# Patient Record
Sex: Male | Born: 1980 | Race: White | Hispanic: No | Marital: Single | Smoking: Never smoker
Health system: Southern US, Community
[De-identification: ages and names within clinical notes are randomized; demographics above are authoritative.]

## PROBLEM LIST (undated history)

## (undated) DIAGNOSIS — J45909 Unspecified asthma, uncomplicated: Secondary | ICD-10-CM

## (undated) DIAGNOSIS — K2270 Barrett's esophagus without dysplasia: Secondary | ICD-10-CM

## (undated) DIAGNOSIS — K219 Gastro-esophageal reflux disease without esophagitis: Secondary | ICD-10-CM

## (undated) DIAGNOSIS — IMO0001 Reserved for inherently not codable concepts without codable children: Secondary | ICD-10-CM

## (undated) DIAGNOSIS — F419 Anxiety disorder, unspecified: Secondary | ICD-10-CM

## (undated) DIAGNOSIS — K2210 Ulcer of esophagus without bleeding: Secondary | ICD-10-CM

## (undated) DIAGNOSIS — M549 Dorsalgia, unspecified: Secondary | ICD-10-CM

## (undated) DIAGNOSIS — Z5189 Encounter for other specified aftercare: Secondary | ICD-10-CM

## (undated) DIAGNOSIS — K449 Diaphragmatic hernia without obstruction or gangrene: Secondary | ICD-10-CM

## (undated) DIAGNOSIS — K222 Esophageal obstruction: Secondary | ICD-10-CM

## (undated) DIAGNOSIS — D649 Anemia, unspecified: Secondary | ICD-10-CM

## (undated) DIAGNOSIS — F1910 Other psychoactive substance abuse, uncomplicated: Secondary | ICD-10-CM

## (undated) HISTORY — DX: Dorsalgia, unspecified: M54.9

## (undated) HISTORY — DX: Diaphragmatic hernia without obstruction or gangrene: K44.9

## (undated) HISTORY — DX: Ulcer of esophagus without bleeding: K22.10

## (undated) HISTORY — PX: UPPER GASTROINTESTINAL ENDOSCOPY: SHX188

## (undated) HISTORY — PX: ESOPHAGEAL DILATION: SHX303

## (undated) HISTORY — PX: COLONOSCOPY: SHX174

## (undated) HISTORY — DX: Encounter for other specified aftercare: Z51.89

## (undated) HISTORY — DX: Gastro-esophageal reflux disease without esophagitis: K21.9

## (undated) HISTORY — DX: Esophageal obstruction: K22.2

## (undated) HISTORY — DX: Unspecified asthma, uncomplicated: J45.909

## (undated) HISTORY — DX: Barrett's esophagus without dysplasia: K22.70

## (undated) HISTORY — DX: Other psychoactive substance abuse, uncomplicated: F19.10

## (undated) HISTORY — DX: Reserved for inherently not codable concepts without codable children: IMO0001

## (undated) HISTORY — PX: HAND SURGERY: SHX662

---

## 2010-01-13 ENCOUNTER — Emergency Department (HOSPITAL_COMMUNITY): Admission: EM | Admit: 2010-01-13 | Discharge: 2010-01-13 | Payer: Self-pay | Admitting: Emergency Medicine

## 2010-01-18 ENCOUNTER — Ambulatory Visit (HOSPITAL_BASED_OUTPATIENT_CLINIC_OR_DEPARTMENT_OTHER): Admission: RE | Admit: 2010-01-18 | Discharge: 2010-01-18 | Payer: Self-pay | Admitting: Orthopedic Surgery

## 2013-01-14 ENCOUNTER — Ambulatory Visit (INDEPENDENT_AMBULATORY_CARE_PROVIDER_SITE_OTHER): Payer: BC Managed Care – PPO | Admitting: Family Medicine

## 2013-01-14 VITALS — BP 118/71 | HR 90 | Temp 97.9°F | Resp 16 | Ht 72.0 in | Wt 184.0 lb

## 2013-01-14 DIAGNOSIS — IMO0002 Reserved for concepts with insufficient information to code with codable children: Secondary | ICD-10-CM

## 2013-01-14 DIAGNOSIS — M546 Pain in thoracic spine: Secondary | ICD-10-CM

## 2013-01-14 DIAGNOSIS — S3992XA Unspecified injury of lower back, initial encounter: Secondary | ICD-10-CM

## 2013-01-14 NOTE — Patient Instructions (Addendum)
Take the diclofenac one twice daily  Take acetaminophen (Tylenol) 500 mg tablets or capsules 2 pills 3 or 4 times daily, maximum 4000 mg in 24 hours.   We will refer you to a back specialist for assessment of your back. I'm not sure why it has continued to be so much trouble 5 years after the injury.  Follow up with your new primary care doctor.

## 2013-01-14 NOTE — Progress Notes (Signed)
Subjective: Patient came in complaining of having fallen off a course this weekend landing on his back on gravel. His head and legs in did not strike hard. He doesn't think he had loss of consciousness. He went to his PCP who put him on diclofenac twice a day. He feels like he needs something more for pain until he can get into the physical therapy appointment that they set up for him for next week.   Objective: He is alert but somewhat evasive in his answers. He did not seem consistent, and did not seem clear on exactly when he had fallen. At first he said yesterday and then he said day before yesterday. He is concerned about needing pain medicine because of his having to get through the week and his job. He is very pale looking, sluggish in his responses. Denies any drug use. Neck supple. Chest clear. Heart regular without murmurs. Tender mid thoracic spine between the scapula. Scapula are nontender. His back does not have any redness like somebody who is following hard on it. Upper extremities have full range of motion. Straight leg raise test negative  Assessment: Back pain secondary to falling off a horse  Plan: I had ordered x-rays of his back and chest and a CBC because he was a little pale. He declines the studies when somebody came in the room to get his x-rays. However when I went back in the room he changes the whole story. Apparently he fell off horse about 5 years ago. He said his back was for couple of years and then for the last several years has been hurting badly. It is been worse recently. We did do a database check to see if he was abusing medications that we could find, and the database was negative except for the alprazolam which he takes.  I explained to them that I cannot give him any pain medications. We will make referral to a back specialist for him. He is considering finding a mother primary care who has compensated, I told him that would probably be a wise idea. It was a very  unusual visit.  Assessment: Back pain secondary to falling off a horse Confabulated history.

## 2013-01-22 ENCOUNTER — Ambulatory Visit (INDEPENDENT_AMBULATORY_CARE_PROVIDER_SITE_OTHER): Payer: BC Managed Care – PPO | Admitting: Family Medicine

## 2013-01-22 ENCOUNTER — Encounter: Payer: Self-pay | Admitting: Family Medicine

## 2013-01-22 VITALS — BP 120/70 | HR 72 | Temp 97.5°F | Resp 12 | Ht 72.0 in | Wt 181.0 lb

## 2013-01-22 DIAGNOSIS — G4700 Insomnia, unspecified: Secondary | ICD-10-CM

## 2013-01-22 DIAGNOSIS — M546 Pain in thoracic spine: Secondary | ICD-10-CM

## 2013-01-22 DIAGNOSIS — F5104 Psychophysiologic insomnia: Secondary | ICD-10-CM

## 2013-01-22 NOTE — Patient Instructions (Addendum)
We will call you regarding x-ray results If x-rays OK will plan to set up some physical therapy.

## 2013-01-22 NOTE — Progress Notes (Signed)
  Subjective:    Patient ID: Eric Hood, male    DOB: February 05, 1981, 32 y.o.   MRN: [id number]  HPI New patient evaluation Previously seen at [facility] family practice Denies any chronic medical problems. No prior surgeries other than hand surgery related to acute tendon injury years ago. Takes no regular medications.  Patient has issue of some right upper thoracic back pain he states since falling off a horse in 2008. He denies ever having any x-rays. Initially went to [facility] family practice and placed on diclofenac which has not helped. Pain is constant. Moderate severity. Location is medial to right scapula. Denies dyspnea. No upper extremity weakness or numbness. No cervical neck pain. Has never had any physical therapy.  When questioned regarding whether he has seen other providers he initially only mentioned [facility]. We have electronic records of having been seen at [facility] on 01/14/2013  He relates some chronic insomnia which he attributes to the back pain. Apparently was placed on alprazolam several months ago at prior practice  Past Medical History  Diagnosis Date  . Back pain    Past Surgical History  Procedure Laterality Date  . Hand surgery Left     reports that he has never smoked. He does not have any smokeless tobacco history on file. He reports that he does not use illicit drugs. His alcohol history is not on file. family history includes Hypertension in his father and mother. No Known Allergies    Review of Systems  Constitutional: Negative for fever, chills, appetite change and unexpected weight change.  HENT: Negative for neck pain.   Respiratory: Negative for cough, shortness of breath and wheezing.   Cardiovascular: Negative for chest pain, palpitations and leg swelling.  Gastrointestinal: Negative for abdominal pain.  Musculoskeletal: Positive for back pain.  Skin: Negative for rash.  Hematological: Negative  for adenopathy. Does not bruise/bleed easily.       Objective:   Physical Exam  Constitutional: He appears well-developed and well-nourished.  Neck: Neck supple. No thyromegaly present.  Cardiovascular: Normal rate and regular rhythm.   Pulmonary/Chest: Effort normal and breath sounds normal. No respiratory distress. He has no wheezes. He has no rales.  Musculoskeletal:  Patient has no new tenderness to palpation thoracic spine. He does have some soft tissue tenderness just lateral to the mid aspect of thoracic spine medial to right scapular region.  Lymphadenopathy:    He has no cervical adenopathy.          Assessment & Plan:  Chronic predominantly right sided upper thoracic back pain of 6 years duration. Patient never had any x-rays nor any physical therapy. Set up thoracic spine films. If normal, set up physical therapy. Continue anti-inflammatory with diclofenac in the meantime Chronic insomnia. We've recommended against any chronic use of alprazolam for insomnia. Send request for records

## 2013-01-28 ENCOUNTER — Ambulatory Visit (INDEPENDENT_AMBULATORY_CARE_PROVIDER_SITE_OTHER)
Admission: RE | Admit: 2013-01-28 | Discharge: 2013-01-28 | Disposition: A | Discharge status: Home / Self Care | Payer: BC Managed Care – PPO | Source: Ambulatory Visit | Attending: Family Medicine | Admitting: Family Medicine

## 2013-01-28 DIAGNOSIS — M546 Pain in thoracic spine: Secondary | ICD-10-CM

## 2013-01-28 NOTE — Progress Notes (Signed)
Quick Note:  Pt informed, reports he has a follow-up this week at office, will discuss at that time ______

## 2013-02-03 ENCOUNTER — Ambulatory Visit: Payer: BC Managed Care – PPO | Admitting: Family Medicine

## 2013-02-03 ENCOUNTER — Telehealth: Payer: Self-pay | Admitting: *Deleted

## 2013-02-03 NOTE — Telephone Encounter (Signed)
Pt was a no show for appt, follow-up on meds. Message left on cell VM re: missed appt.

## 2013-02-03 NOTE — Telephone Encounter (Signed)
I do not see any past no show appts.

## 2013-02-11 ENCOUNTER — Ambulatory Visit (INDEPENDENT_AMBULATORY_CARE_PROVIDER_SITE_OTHER): Payer: Self-pay | Admitting: Family Medicine

## 2013-02-11 ENCOUNTER — Encounter: Payer: Self-pay | Admitting: Family Medicine

## 2013-02-11 VITALS — BP 120/68 | Temp 99.1°F | Wt 177.0 lb

## 2013-02-11 DIAGNOSIS — D239 Other benign neoplasm of skin, unspecified: Secondary | ICD-10-CM

## 2013-02-11 DIAGNOSIS — M546 Pain in thoracic spine: Secondary | ICD-10-CM | POA: Insufficient documentation

## 2013-02-11 DIAGNOSIS — D229 Melanocytic nevi, unspecified: Secondary | ICD-10-CM

## 2013-02-11 DIAGNOSIS — R05 Cough: Secondary | ICD-10-CM

## 2013-02-11 MED ORDER — PREDNISONE 20 MG PO TABS: ORAL_TABLET | ORAL | Status: DC

## 2013-02-11 MED ORDER — AZITHROMYCIN 250 MG PO TABS: ORAL_TABLET | ORAL | Status: AC

## 2013-02-11 MED ORDER — GABAPENTIN 300 MG PO CAPS: 300.0000 mg | ORAL_CAPSULE | Freq: Two times a day (BID) | ORAL | Status: DC

## 2013-02-11 NOTE — Progress Notes (Signed)
  Subjective:    Patient ID: Eric Hood, male    DOB: 12/08/1980, 32 y.o.   MRN: [id number]  HPI Patient seen for followup several items as below  He had several year history of thoracic back pain as previously outlined. Started after falling off a horse-6 years ago. We obtained thoracic spine films which showed no acute abnormality. He is describing a burning type pain and occasional numbness mostly to the right thoracic area but occasionally left thoracic region as well. No history of shingles. Has tried anti-inflammatories without much improvement. Pain frequently wakes him at night.  He has acute issue of cough which is productive past several days. Some mild wheezing off and on. No history of asthma. He had some low-grade fevers past couple days. Increase fatigue.  Patient has multiple nevi. Denies any bleeding or itching with moles.  Past Medical History  Diagnosis Date  . Back pain    Past Surgical History  Procedure Laterality Date  . Hand surgery Left     reports that he has never smoked. He does not have any smokeless tobacco history on file. He reports that he does not use illicit drugs. His alcohol history is not on file. family history includes Hypertension in his father and mother. No Known Allergies    Review of Systems  Constitutional: Positive for fever, chills and fatigue. Negative for appetite change and unexpected weight change.  HENT: Positive for sore throat.   Respiratory: Positive for cough and wheezing. Negative for shortness of breath.   Musculoskeletal: Positive for back pain.       Objective:   Physical Exam  Constitutional: He appears well-developed and well-nourished.  HENT:  Right Ear: External ear normal.  Left Ear: External ear normal.  Mouth/Throat: Oropharynx is clear and moist.  Neck: Neck supple.  Cardiovascular: Normal rate and regular rhythm.   Pulmonary/Chest:  Patient has some diffuse wheezes. No rales. Symmetric breath  sounds. No retractions. O2 sat 98%  Musculoskeletal:  Patient has some mild tenderness parathoracic spine and right and left side around T4-T5 region.  Lymphadenopathy:    He has no cervical adenopathy.  Skin:  Patient has fairly large nevus left lower lumbar region. Approximately 1 cm diameter. Somewhat indistinct border. Mostly symmetric. Mild color variegation          Assessment & Plan:  #1 chronic thoracic back pain. Question neuropathic. Recent x-rays unremarkable. Trial of gabapentin 300 mg at night and after one week titrate 300 mg twice daily if necessary and reassess in 2 weeks #2 atypical nevus left lower back. We recommended followup for excision in 2 weeks #3 acute respiratory illness. He has associated wheezing. Start Zithromax. Prednisone 20 mg 2 daily for 6 days

## 2013-02-11 NOTE — Patient Instructions (Addendum)
Schedule for mole excision. Start Gabapentin one at night for one week and then titrate to one twice daily if needed.

## 2013-02-13 ENCOUNTER — Telehealth: Payer: Self-pay | Admitting: Family Medicine

## 2013-02-13 MED ORDER — HYDROCODONE-HOMATROPINE 5-1.5 MG/5ML PO SYRP: 5.0000 mL | ORAL_SOLUTION | Freq: Four times a day (QID) | ORAL | Status: DC | PRN

## 2013-02-13 NOTE — Telephone Encounter (Signed)
Patient Information:  Caller Name: Furkan  Phone: [phone]  Patient: Eric Hood  Gender: Male  DOB: Aug 07, 1981  Age: 32 Years  PCP: Evelena Peat [facility])  Office Follow Up:  Does the office need to follow up with this patient?: Yes  Instructions For The Office: Patient reuesting prescription cough syrup.  Please call him back.  RN Note:  Patient reuesting prescription cough syrup.  Please call him back.  He is currently taking OTC cough syrup with DM but advises that the coughing is interfering with work and sleep.  Symptoms  Reason For Call & Symptoms: Onset 02/08/2013 productive cough with greenish sputum.  Reviewed Health History In EMR: Yes  Reviewed Medications In EMR: Yes  Reviewed Allergies In EMR: Yes  Reviewed Surgeries / Procedures: Yes  Date of Onset of Symptoms: 02/08/2013  Treatments Tried: Robitussin, Nyquil  Treatments Tried Worked: No  Guideline(s) Used:  Cough  Disposition Per Guideline:   Home Care  Reason For Disposition Reached:   Cough with no complications  Advice Given:  Reassurance  Coughing is the way that our lungs remove irritants and mucus. It helps protect our lungs from getting pneumonia.  You can also get a cough after being exposed to irritating substances like smoke, strong perfumes, and dust.  Here is some care advice that should help.  Cough Medicines:  OTC Cough Syrups: The most common cough suppressant in OTC cough medications is dextromethorphan. Often the letters "DM" appear in the name.  OTC Cough Drops: Cough drops can help a lot, especially for mild coughs. They reduce coughing by soothing your irritated throat and removing that tickle sensation in the back of the throat. Cough drops also have the advantage of portability - you can carry them with you.  OTC Cough Syrup - Dextromethorphan:  Cough syrups containing the cough suppressant dextromethorphan (DM) may help decrease your cough. Cough syrups work best for  coughs that keep you awake at night. They can also sometimes help in the late stages of a respiratory infection when the cough is dry and hacking. They can be used along with cough drops.  Examples: Benylin, Robitussin DM, Vicks 44 Cough Relief  Read the package instructions for dosage, contraindications, and other important information.  Caution - Dextromethorphan:   Do not try to completely suppress coughs that produce mucus and phlegm. Remember that coughing is helpful in bringing up mucus from the lungs and preventing pneumonia.  Research Notes: Dextromethorphan in some research studies has been shown to reduce the frequency and severity of cough in adults (18 years or older) without significant adverse effects. However, other studies suggest that dextromethorphan is no better than placebo at reducing a cough.  Drug Abuse Potential: It should be noted that dextromethorphan has become a drug of abuse. This problem is seen most often in adolescents. Overdose symptoms can range from giggling and euphoria to hallucinations and coma.  Coughing Spasms:  Drink warm fluids. Inhale warm mist (Reason: both relax the airway and loosen up the phlegm).  Suck on cough drops or hard candy to coat the irritated throat.  Prevent Dehydration:  Drink adequate liquids.  This will help soothe an irritated or dry throat and loosen up the phlegm.  Avoid Tobacco Smoke:  Smoking or being exposed to smoke makes coughs much worse.  Expected Course:   The expected course depends on what is causing the cough.  Viral bronchitis (chest cold) causes a cough that lasts 1 to 3 weeks. Sometimes you  may cough up lots of phlegm (sputum, mucus). The mucus can normally be white, gray, yellow, or green.  Call Back If:  Difficulty breathing  Cough lasts more than 3 weeks  Fever lasts > 3 days  You become worse.  Patient Will Follow Care Advice:  YES

## 2013-02-13 NOTE — Telephone Encounter (Signed)
Pt had OV on 4/29 (Tues).  He is taking zithromax and Prednoisone as directed, however he is still coughing bad, requesting a cough med.

## 2013-02-13 NOTE — Telephone Encounter (Signed)
Hycodan 1 tsp po q 6 hours prn cough, disp 120 ml with no refill.

## 2013-02-13 NOTE — Telephone Encounter (Signed)
Rx called in, pt informed

## 2013-02-18 ENCOUNTER — Telehealth: Payer: Self-pay | Admitting: Family Medicine

## 2013-02-18 NOTE — Telephone Encounter (Signed)
Pt informed and he voiced his understanding.

## 2013-02-18 NOTE — Telephone Encounter (Signed)
Caller: Eric Hood/Patient; Phone: [phone]; Reason for Call: Reports he was seen in the office last week on 02/11/13 and given Zithromax, Prednisone, Hydromet cough syrup.  Patient reports his symptoms are not improving.  He feels more fatigued than he did when he was seen in the office.  Reports productive cough with green mucus.  Afebrile.  Patient would like to know if there is something different he should be taking at this time.  Please contact him with further instructions.

## 2013-02-18 NOTE — Telephone Encounter (Signed)
No.  He should not be having any fever at this time.  If not feeling better by end of week I would go ahead with CXR.

## 2013-02-25 ENCOUNTER — Encounter: Payer: Self-pay | Admitting: Family Medicine

## 2013-02-25 ENCOUNTER — Ambulatory Visit (INDEPENDENT_AMBULATORY_CARE_PROVIDER_SITE_OTHER): Payer: BC Managed Care – PPO | Admitting: Family Medicine

## 2013-02-25 VITALS — BP 102/62 | Temp 99.7°F | Wt 177.0 lb

## 2013-02-25 DIAGNOSIS — D229 Melanocytic nevi, unspecified: Secondary | ICD-10-CM

## 2013-02-25 DIAGNOSIS — D239 Other benign neoplasm of skin, unspecified: Secondary | ICD-10-CM

## 2013-02-25 DIAGNOSIS — M546 Pain in thoracic spine: Secondary | ICD-10-CM

## 2013-02-25 NOTE — Patient Instructions (Signed)
Keep wound dry for the first 24 hours then clean daily with soap and water for one week. Apply topical antibiotic daily for 3-4 days. Keep covered with clean dressing for 4-5 days. Follow up promptly for any signs of infection such as redness, warmth, pain, or drainage.  Go ahead and titrate gabapentin up to three times daily

## 2013-02-25 NOTE — Progress Notes (Signed)
  Subjective:    Patient ID: Eric Hood, male    DOB: 1981/09/19, 32 y.o.   MRN: [id number]  HPI Patient seen for followup regarding the following items  Chronic thoracic back pain. Refer to prior note. Six-year duration following injury from horseback riding Recent x-rays revealed no acute bony abnormality Continues to describe burning type sensation right thoracic region. Somewhat intermittent. 7-8/10 intensity at worse We suspected possible neuropathic origin. Started gabapentin 300 mg at night last visit. Past couple days taking 300 mg twice daily.  Has not seen much improvement in pain thus far. We incidentally noted atypical nevus left lower back last visit. No itching or bleeding. No history of skin cancer. We planned excision today  Past Medical History  Diagnosis Date  . Back pain    Past Surgical History  Procedure Laterality Date  . Hand surgery Left     reports that he has never smoked. He does not have any smokeless tobacco history on file. He reports that he does not use illicit drugs. His alcohol history is not on file. family history includes Hypertension in his father and mother. No Known Allergies    Review of Systems  Constitutional: Negative for fever and chills.  Respiratory: Negative for cough and shortness of breath.   Cardiovascular: Negative for chest pain.  Gastrointestinal: Negative for abdominal pain.  Musculoskeletal: Positive for back pain.  Skin: Negative for rash.       Left lower lumbar region reveals about 1.2 cm nevus which has mild asymmetry and slightly irregular border. He has mild color variegation.       Objective:   Physical Exam  Constitutional: He appears well-developed and well-nourished. No distress.  Cardiovascular: Normal rate and regular rhythm.   Pulmonary/Chest: Effort normal and breath sounds normal. No respiratory distress. He has no wheezes. He has no rales.  Musculoskeletal: He exhibits no edema.  Skin:   Patient has approximately 1.2 cm nevus left lower lumbar region. Mild color variegation. Minimal asymmetry.          Assessment & Plan:  #1 chronic thoracic back pain. Titrate gabapentin 300 mg 3 times a day. Reassess at followup in 10 days  #2 atypical nevus left lumbar region. Discussed risk and benefits of elliptical excision and patient consented. Anesthetized region with Xylocaine with epinephrine. Prepped with Betadine. Using sterile technique, elliptical excision with #15 blade. Minimal bleeding. Wound closed with 5-0 Ethilon. Patient tolerated well. Topical antibiotic and dressing applied. Wound care instruction given. Specimen sent to pathology for further evaluation. Return in 10 days for suture removal

## 2013-02-28 NOTE — Progress Notes (Signed)
Quick Note:  Pt informed ______ 

## 2013-03-07 ENCOUNTER — Ambulatory Visit (INDEPENDENT_AMBULATORY_CARE_PROVIDER_SITE_OTHER): Payer: BC Managed Care – PPO | Admitting: Family Medicine

## 2013-03-07 VITALS — BP 120/78 | Temp 98.3°F | Wt 176.0 lb

## 2013-03-07 DIAGNOSIS — M546 Pain in thoracic spine: Secondary | ICD-10-CM

## 2013-03-07 DIAGNOSIS — D235 Other benign neoplasm of skin of trunk: Secondary | ICD-10-CM | POA: Insufficient documentation

## 2013-03-07 NOTE — Progress Notes (Signed)
  Subjective:    Patient ID: Eric Hood, male    DOB: 1981-09-15, 32 y.o.   MRN: [id number]  HPI Patient recent excision of atypical pigmented lesion left lower back. No problems with healing. Pathology came back dysplastic nevus with moderate atypia. Minimal itching otherwise asymptomatic  Persistent thoracic back pain. Refer to prior notes. He's had some minimal relief with gabapentin. Still has intermittent numbness and burning sensation which seems to radiate somewhat to the right of thoracic spine and this is been chronic lasting about 6 years following remote injury. Plain x-rays were normal. He has no cough, dyspnea, pleuritic pain, or any appetite or weight changes.  Past Medical History  Diagnosis Date  . Back pain    Past Surgical History  Procedure Laterality Date  . Hand surgery Left     reports that he has never smoked. He does not have any smokeless tobacco history on file. He reports that he does not use illicit drugs. His alcohol history is not on file. family history includes Hypertension in his father and mother. No Known Allergies    Review of Systems  Constitutional: Negative for fever, chills, appetite change and unexpected weight change.  Respiratory: Negative for cough and shortness of breath.   Cardiovascular: Negative for chest pain.       Objective:   Physical Exam  Constitutional: He appears well-developed and well-nourished.  Cardiovascular: Normal rate and regular rhythm.   Pulmonary/Chest: Effort normal and breath sounds normal. No respiratory distress. He has no wheezes. He has no rales.  Skin:  Wound left lower back is healing well. Sutures removed. 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 This has been excised fully. Needs close followup yearly for skin checks given his multiple nevi #2 chronic thoracic back pain. Titrate gabapentin 600 mg might continue one  in morning and one at lunch. Further titrate one week if no improvement. Call in 3 weeks if no additional improvement. Consider either MRI thoracic spine or referral to specialist at that point

## 2013-03-07 NOTE — Patient Instructions (Addendum)
Increase gabapentin to 2 at night and continue with one in AM and one at lunch If no relief with that after one week may then titrate further to two in AM one at lunch and two at night Call in 3 weeks if no better.

## 2013-04-09 ENCOUNTER — Ambulatory Visit (INDEPENDENT_AMBULATORY_CARE_PROVIDER_SITE_OTHER): Payer: Self-pay | Admitting: Family Medicine

## 2013-04-09 ENCOUNTER — Encounter: Payer: Self-pay | Admitting: Family Medicine

## 2013-04-09 VITALS — BP 124/78 | HR 84 | Temp 98.5°F | Resp 16 | Wt 180.5 lb

## 2013-04-09 DIAGNOSIS — F411 Generalized anxiety disorder: Secondary | ICD-10-CM

## 2013-04-09 MED ORDER — ALPRAZOLAM 1 MG PO TABS: 1.0000 mg | ORAL_TABLET | Freq: Three times a day (TID) | ORAL | Status: DC | PRN

## 2013-04-09 NOTE — Progress Notes (Signed)
  Subjective:    Patient ID: Eric Hood, male    DOB: [birth year], 32 y.o.   MRN: [id number]  HPI Patient seen for evaluation of anxiety. He has had chronic anxiety for some time which he thinks is mostly situational Yesterday had acute episode of anxiety without clear provocation. He describes some tremor, dyspnea, diaphoresis. Symptoms lasted several hours. Generally has 1 or 2 episodes per week that are more than his usual anxiety. Possibly occur more frequently at work. No history of social anxiety. No history of OCD. Denies any depression symptoms.  Has some ongoing thoracic back pain which sounds more neuropathic. He did not get any relief of gabapentin 300 mg 3 times a day  Past Medical History  Diagnosis Date  . Back pain    Past Surgical History  Procedure Laterality Date  . Hand surgery Left     reports that he has never smoked. He does not have any smokeless tobacco history on file. He reports that he does not use illicit drugs. His alcohol history is not on file. family history includes Hypertension in his father and mother. No Known Allergies    Review of Systems  Constitutional: Negative for appetite change and unexpected weight change.  Cardiovascular: Negative for chest pain.  Psychiatric/Behavioral: Negative for dysphoric mood and agitation. The patient is nervous/anxious.        Objective:   Physical Exam  Constitutional: He appears well-developed and well-nourished.  Neck: Neck supple.  Cardiovascular: Normal rate and regular rhythm.   Pulmonary/Chest: Effort normal and breath sounds normal. No respiratory distress. He has no wheezes. He has no rales.  Musculoskeletal: He exhibits no edema.  Psychiatric: He has a normal mood and affect. His behavior is normal.          Assessment & Plan:  Anxiety. Episode yesterday sound suspicious for panic disorder. We discussed prophylactic medication such as Zoloft but at this point is not interested in  taking daily medication. We agreed to one refill of alprazolam 1 mg for severe anxiety symptoms but explained this is not a good long-term option and cannot be used regularly. He will reconsider sertraline if symptoms become more frequent

## 2013-04-09 NOTE — Patient Instructions (Addendum)

## 2013-06-09 ENCOUNTER — Other Ambulatory Visit: Payer: Self-pay | Admitting: Family Medicine

## 2013-06-09 NOTE — Telephone Encounter (Signed)
Last refill 04/09/13 #30 1 refill

## 2013-06-09 NOTE — Telephone Encounter (Signed)
As discussed at June visit, if anxiety symptoms becoming more frequent, we need to consider prevention medication.  Alprazolam can be very habit forming and difficult to stop. May refill #15 only and recommend follow up to discuss/start prevention medication.

## 2013-06-20 ENCOUNTER — Ambulatory Visit: Payer: Self-pay | Admitting: Family Medicine

## 2013-07-11 ENCOUNTER — Telehealth: Payer: Self-pay | Admitting: Family Medicine

## 2013-07-11 ENCOUNTER — Ambulatory Visit (INDEPENDENT_AMBULATORY_CARE_PROVIDER_SITE_OTHER): Payer: Self-pay | Admitting: Family Medicine

## 2013-07-11 ENCOUNTER — Encounter: Payer: Self-pay | Admitting: Family Medicine

## 2013-07-11 VITALS — BP 124/70 | HR 108 | Temp 98.3°F | Wt 187.0 lb

## 2013-07-11 DIAGNOSIS — G8929 Other chronic pain: Secondary | ICD-10-CM

## 2013-07-11 DIAGNOSIS — G4700 Insomnia, unspecified: Secondary | ICD-10-CM

## 2013-07-11 DIAGNOSIS — M549 Dorsalgia, unspecified: Secondary | ICD-10-CM

## 2013-07-11 MED ORDER — ALPRAZOLAM 1 MG PO TABS: ORAL_TABLET | ORAL | Status: DC

## 2013-07-11 MED ORDER — HYDROMORPHONE HCL 2 MG PO TABS: 2.0000 mg | ORAL_TABLET | ORAL | Status: DC | PRN

## 2013-07-11 MED ORDER — RAMELTEON 8 MG PO TABS: 8.0000 mg | ORAL_TABLET | Freq: Every day | ORAL | Status: DC

## 2013-07-11 NOTE — Telephone Encounter (Signed)
Pt went to p/u ramelteon (ROZEREM) 8 MG tablet and it was $236. Pt would like top know if he could get alternate/pt cannot afford this. Pharm: [facility]

## 2013-07-11 NOTE — Patient Instructions (Addendum)
Insomnia Insomnia is frequent trouble falling and/or staying asleep. Insomnia can be a long term problem or a short term problem. Both are common. Insomnia can be a short term problem when the wakefulness is related to a certain stress or worry. Long term insomnia is often related to ongoing stress during waking hours and/or poor sleeping habits. Overtime, sleep deprivation itself can make the problem worse. Every little thing feels more severe because you are overtired and your ability to cope is decreased. CAUSES   Stress, anxiety, and depression.  Poor sleeping habits.  Distractions such as TV in the bedroom.  Naps close to bedtime.  Engaging in emotionally charged conversations before bed.  Technical reading before sleep.  Alcohol and other sedatives. They may make the problem worse. They can hurt normal sleep patterns and normal dream activity.  Stimulants such as caffeine for several hours prior to bedtime.  Pain syndromes and shortness of breath can cause insomnia.  Exercise late at night.  Changing time zones may cause sleeping problems (jet lag). It is sometimes helpful to have someone observe your sleeping patterns. They should look for periods of not breathing during the night (sleep apnea). They should also look to see how long those periods last. If you live alone or observers are uncertain, you can also be observed at a sleep clinic where your sleep patterns will be professionally monitored. Sleep apnea requires a checkup and treatment. Give your caregivers your medical history. Give your caregivers observations your family has made about your sleep.  SYMPTOMS   Not feeling rested in the morning.  Anxiety and restlessness at bedtime.  Difficulty falling and staying asleep. TREATMENT   Your caregiver may prescribe treatment for an underlying medical disorders. Your caregiver can give advice or help if you are using alcohol or other drugs for self-medication. Treatment  of underlying problems will usually eliminate insomnia problems.  Medications can be prescribed for short time use. They are generally not recommended for lengthy use.  Over-the-counter sleep medicines are not recommended for lengthy use. They can be habit forming.  You can promote easier sleeping by making lifestyle changes such as:  Using relaxation techniques that help with breathing and reduce muscle tension.  Exercising earlier in the day.  Changing your diet and the time of your last meal. No night time snacks.  Establish a regular time to go to bed.  Counseling can help with stressful problems and worry.  Soothing music and white noise may be helpful if there are background noises you cannot remove.  Stop tedious detailed work at least one hour before bedtime. HOME CARE INSTRUCTIONS   Keep a diary. Inform your caregiver about your progress. This includes any medication side effects. See your caregiver regularly. Take note of:  Times when you are asleep.  Times when you are awake during the night.  The quality of your sleep.  How you feel the next day. This information will help your caregiver care for you.  Get out of bed if you are still awake after 15 minutes. Read or do some quiet activity. Keep the lights down. Wait until you feel sleepy and go back to bed.  Keep regular sleeping and waking hours. Avoid naps.  Exercise regularly.  Avoid distractions at bedtime. Distractions include watching television or engaging in any intense or detailed activity like attempting to balance the household checkbook.  Develop a bedtime ritual. Keep a familiar routine of bathing, brushing your teeth, climbing into bed at the same   time each night, listening to soothing music. Routines increase the success of falling to sleep faster.  Use relaxation techniques. This can be using breathing and muscle tension release routines. It can also include visualizing peaceful scenes. You can  also help control troubling or intruding thoughts by keeping your mind occupied with boring or repetitive thoughts like the old concept of counting sheep. You can make it more creative like imagining planting one beautiful flower after another in your backyard garden.  During your day, work to eliminate stress. When this is not possible use some of the previous suggestions to help reduce the anxiety that accompanies stressful situations. MAKE SURE YOU:   Understand these instructions.  Will watch your condition.  Will get help right away if you are not doing well or get worse. Document Released: 09/29/2000 Document Revised: 12/25/2011 Document Reviewed: 10/30/2007 [facility] Patient Information 2014 [place], [place]. Lumbosacral Strain Lumbosacral strain is one of the most common causes of back pain. There are many causes of back pain. Most are not serious conditions. CAUSES  Your backbone (spinal column) is made up of 24 main vertebral bodies, the sacrum, and the coccyx. These are held together by muscles and tough, fibrous tissue (ligaments). Nerve roots pass through the openings between the vertebrae. A sudden move or injury to the back may cause injury to, or pressure on, these nerves. This may result in localized back pain or pain movement (radiation) into the buttocks, down the leg, and into the foot. Sharp, shooting pain from the buttock down the back of the leg (sciatica) is frequently associated with a ruptured (herniated) disk. Pain may be caused by muscle spasm alone. Your caregiver can often find the cause of your pain by the details of your symptoms and an exam. In some cases, you may need tests (such as X-rays). Your caregiver will work with you to decide if any tests are needed based on your specific exam. HOME CARE INSTRUCTIONS   Avoid an underactive lifestyle. Active exercise, as directed by your caregiver, is your greatest weapon against back pain.  Avoid hard physical activities  (tennis, racquetball, waterskiing) if you are not in proper physical condition for it. This may aggravate or create problems.  If you have a back problem, avoid sports requiring sudden body movements. Swimming and walking are generally safer activities.  Maintain good posture.  Avoid becoming overweight (obese).  Use bed rest for only the most extreme, sudden (acute) episode. Your caregiver will help you determine how much bed rest is necessary.  For acute conditions, you may put ice on the injured area.  Put ice in a plastic bag.  Place a towel between your skin and the bag.  Leave the ice on for 15-20 minutes at a time, every 2 hours, or as needed.  After you are improved and more active, it may help to apply heat for 30 minutes before activities. See your caregiver if you are having pain that lasts longer than expected. Your caregiver can advise appropriate exercises or therapy if needed. With conditioning, most back problems can be avoided. SEEK IMMEDIATE MEDICAL CARE IF:   You have numbness, tingling, weakness, or problems with the use of your arms or legs.  You experience severe back pain not relieved with medicines.  There is a change in bowel or bladder control.  You have increasing pain in any area of the body, including your belly (abdomen).  You notice shortness of breath, dizziness, or feel faint.  You feel sick  to your stomach (nauseous), are throwing up (vomiting), or become sweaty.  You notice discoloration of your toes or legs, or your feet get very cold.  Your back pain is getting worse.  You have a fever. MAKE SURE YOU:   Understand these instructions.  Will watch your condition.  Will get help right away if you are not doing well or get worse. Document Released: 07/12/2005 Document Revised: 12/25/2011 Document Reviewed: 01/01/2009 [facility] Patient Information 2014 [place], [place].

## 2013-07-11 NOTE — Progress Notes (Signed)
  Subjective:    Patient ID: Eric Hood, male    DOB: 18-Mar-1981, 32 y.o.   MRN: [id number]  HPI Patient here for issues as follows:  He has chronic insomnia. He has mostly difficulty falling asleep. Denies any depressive symptoms. He attributes some of his sleep difficulties secondary to chronic back pain. No significant caffeine use. Very infrequent alcohol use. He does not take any decongestants. Previously taken Ambien but had some hangover drowsiness.  He has a chronic back pains. Previously taken hydromorphone for severe pain and tries to take very infrequently. He's tried over-the-counter medications such as Tylenol, Aleve, ibuprofen without improvement. He's had some chronic thoracic pain as well as some chronic lumbar pains. He had previous physical therapy. Current pain is mostly left lower lumbar region. No radiculopathy symptoms. No lower extremity numbness or weakness. Denies any recent specific injury.  Past Medical History  Diagnosis Date  . Back pain    Past Surgical History  Procedure Laterality Date  . Hand surgery Left     reports that he has never smoked. He does not have any smokeless tobacco history on file. He reports that he does not use illicit drugs. His alcohol history is not on file. family history includes Hypertension in his father and mother. No Known Allergies    Review of Systems  Constitutional: Negative for fever, activity change, appetite change and unexpected weight change.  Respiratory: Negative for cough and shortness of breath.   Cardiovascular: Negative for chest pain and leg swelling.  Gastrointestinal: Negative for vomiting and abdominal pain.  Endocrine: Negative for polydipsia and polyuria.  Genitourinary: Negative for dysuria, hematuria and flank pain.  Musculoskeletal: Positive for back pain. Negative for joint swelling.  Neurological: Negative for weakness and numbness.  Psychiatric/Behavioral: Positive for sleep disturbance.        Objective:   Physical Exam  Constitutional: He appears well-developed and well-nourished.  Cardiovascular: Normal rate and regular rhythm.   Pulmonary/Chest: Effort normal and breath sounds normal. No respiratory distress. He has no wheezes. He has no rales.  Musculoskeletal: He exhibits no edema.  Neurological:  Straight leg raise are negative. No focal weakness  Psychiatric: He has a normal mood and affect. His behavior is normal.          Assessment & Plan:  #1 insomnia. Sleep hygiene discussed. We've recommended trial of Rozerem 8 mg by mouth each bedtime. We've recommended against regular use of alprazolam #2 chronic back pain. We discussed exercises and stretches. We have recommended consideration for medication such as Cymbalta. We agreed to very limited hydromorphone 2 mg #30 with no refill 1 every 4 hours as needed for severe pain

## 2013-07-13 NOTE — Telephone Encounter (Signed)
Recommend trial of Trazodone 50 mg qhs  (disp #30 with 3 refills) and take Rozearem off list.

## 2013-07-14 MED ORDER — TRAZODONE HCL 50 MG PO TABS: 50.0000 mg | ORAL_TABLET | Freq: Every day | ORAL | Status: DC

## 2013-07-14 NOTE — Telephone Encounter (Signed)
RX sent to pharmacy  

## 2013-07-21 ENCOUNTER — Telehealth: Payer: Self-pay | Admitting: Family Medicine

## 2013-07-21 NOTE — Telephone Encounter (Signed)
Pt states he has had a rough couple of weeks and would like another refill of ALPRAZolam (XANAX) 1 MG tablet Pharm: [facility] [address]

## 2013-07-21 NOTE — Telephone Encounter (Signed)
Last visit 07/11/13 Last refill 07/11/13 #15 1 refill

## 2013-07-22 NOTE — Telephone Encounter (Signed)
Denied.  He was given #15 with one refill on 9-26 and should not be finished with this yet.  As we have discussed previously, Xanax is not a good choice for chronic anxiety or insomnia. I would recommend instead trial of another SSRI-

## 2013-07-22 NOTE — Telephone Encounter (Signed)
Pt informed

## 2013-09-08 ENCOUNTER — Ambulatory Visit (INDEPENDENT_AMBULATORY_CARE_PROVIDER_SITE_OTHER): Payer: Self-pay | Admitting: Family Medicine

## 2013-09-08 ENCOUNTER — Encounter: Payer: Self-pay | Admitting: Family Medicine

## 2013-09-08 VITALS — BP 118/70 | HR 109 | Temp 98.3°F | Wt 184.0 lb

## 2013-09-08 DIAGNOSIS — M545 Low back pain: Secondary | ICD-10-CM

## 2013-09-08 MED ORDER — HYDROMORPHONE HCL 2 MG PO TABS: 2.0000 mg | ORAL_TABLET | ORAL | Status: DC | PRN

## 2013-09-08 NOTE — Progress Notes (Signed)
Pre visit review using our clinic review tool, if applicable. No additional management support is needed unless otherwise documented below in the visit note. 

## 2013-09-08 NOTE — Patient Instructions (Signed)
Back Pain, Adult Low back pain is very common. About 1 in 5 people have back pain.The cause of low back pain is rarely dangerous. The pain often gets better over time.About half of people with a sudden onset of back pain feel better in just 2 weeks. About 8 in 10 people feel better by 6 weeks.  CAUSES Some common causes of back pain include:  Strain of the muscles or ligaments supporting the spine.  Wear and tear (degeneration) of the spinal discs.  Arthritis.  Direct injury to the back. DIAGNOSIS Most of the time, the direct cause of low back pain is not known.However, back pain can be treated effectively even when the exact cause of the pain is unknown.Answering your caregiver's questions about your overall health and symptoms is one of the most accurate ways to make sure the cause of your pain is not dangerous. If your caregiver needs more information, he or she may order lab work or imaging tests (X-rays or MRIs).However, even if imaging tests show changes in your back, this usually does not require surgery. HOME CARE INSTRUCTIONS For many people, back pain returns.Since low back pain is rarely dangerous, it is often a condition that people can learn to manageon their own.   Remain active. It is stressful on the back to sit or stand in one place. Do not sit, drive, or stand in one place for more than 30 minutes at a time. Take short walks on level surfaces as soon as pain allows.Try to increase the length of time you walk each day.  Do not stay in bed.Resting more than 1 or 2 days can delay your recovery.  Do not avoid exercise or work.Your body is made to move.It is not dangerous to be active, even though your back may hurt.Your back will likely heal faster if you return to being active before your pain is gone.  Pay attention to your body when you bend and lift. Many people have less discomfortwhen lifting if they bend their knees, keep the load close to their bodies,and  avoid twisting. Often, the most comfortable positions are those that put less stress on your recovering back.  Find a comfortable position to sleep. Use a firm mattress and lie on your side with your knees slightly bent. If you lie on your back, put a pillow under your knees.  Only take over-the-counter or prescription medicines as directed by your caregiver. Over-the-counter medicines to reduce pain and inflammation are often the most helpful.Your caregiver may prescribe muscle relaxant drugs.These medicines help dull your pain so you can more quickly return to your normal activities and healthy exercise.  Put ice on the injured area.  Put ice in a plastic bag.  Place a towel between your skin and the bag.  Leave the ice on for 15-20 minutes, 03-04 times a day for the first 2 to 3 days. After that, ice and heat may be alternated to reduce pain and spasms.  Ask your caregiver about trying back exercises and gentle massage. This may be of some benefit.  Avoid feeling anxious or stressed.Stress increases muscle tension and can worsen back pain.It is important to recognize when you are anxious or stressed and learn ways to manage it.Exercise is a great option. SEEK MEDICAL CARE IF:  You have pain that is not relieved with rest or medicine.  You have pain that does not improve in 1 week.  You have new symptoms.  You are generally not feeling well. SEEK   IMMEDIATE MEDICAL CARE IF:   You have pain that radiates from your back into your legs.  You develop new bowel or bladder control problems.  You have unusual weakness or numbness in your arms or legs.  You develop nausea or vomiting.  You develop abdominal pain.  You feel faint. Document Released: 10/02/2005 Document Revised: 04/02/2012 Document Reviewed: 02/20/2011 [employer] Patient Information 2014 [employer].  

## 2013-09-08 NOTE — Progress Notes (Signed)
  Subjective:    Patient ID: Eric Hood, male    DOB: Jun 11, 1981, 32 y.o.   MRN: [id number]  HPI Dyspnea with low back pain and chronic upper back pain. Refer to prior notes. He currently has no insurance. We discussed our desire to send for physical therapy. 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 His job requires a lot of bending and lifting. He denies any recent specific injury. He has a chronic upper thoracic pain with previous x-rays unrevealing. He's recently had some low back pain. No radiculopathy symptoms. No weakness. No numbness. He tried over-the-counter medications without improvement. Pain is severe at times. His tried anti-inflammatories without improvement. Previously used muscle relaxers without success  Past Medical History  Diagnosis Date  . Back pain    Past Surgical History  Procedure Laterality Date  . Hand surgery Left     reports that he has never smoked. He does not have any smokeless tobacco history on file. He reports that he does not use illicit drugs. His alcohol history is not on file. family history includes Hypertension in his father and mother. No Known Allergies    Review of Systems  Constitutional: Negative for fever, activity change and appetite change.  Respiratory: Negative for chest tightness and shortness of breath.   Cardiovascular: Negative for chest pain, palpitations and leg swelling.  Gastrointestinal: Negative for abdominal pain.  Genitourinary: Negative for dysuria, hematuria and flank pain.  Musculoskeletal: Positive for back pain. Negative for joint swelling.  Neurological: Negative for dizziness, syncope, weakness, light-headedness, numbness and headaches.       Objective:   Physical Exam  Constitutional: He appears well-developed and well-nourished.  Cardiovascular: Normal rate and regular rhythm.   Musculoskeletal: He exhibits no edema.  Straight leg raise are  negative. No reproducible point tenderness thoracic or lumbar spine.  Neurological: He has normal reflexes.  No focal strength deficits. Sensory function is normal lower extremities          Assessment & Plan:  Back pain which is relatively diffuse involving thoracic and lumbar spine. This has been chronic and intermittent. Exacerbated by work. We discussed the fact that he really needs some physical therapy but he currently has no insurance. We have given some basic stretches. We discussed options such as Cymbalta but he has issues with the cost at this point no insurance. We have recommended against any long term use of opioids. We did give one refill of hydromorphone 2 milligrams which he is using very sparingly #30 with no refill.

## 2013-09-09 ENCOUNTER — Telehealth: Payer: Self-pay | Admitting: Family Medicine

## 2013-09-09 NOTE — Telephone Encounter (Signed)
i have already explained to patient: 1) we tried multiple other meds (nonopioids) with no relief 2) recommended PT but he has no insurance 3) consider Cymbalta but he has no insurance and was not interested secondary to cost 4) Chronic opioids are not indicated for nondescript back pain without trying other options.  I do not suggest other "stronger" pain meds at this time.  Options include referral to PT, trial of Cymbalta, or referral to pain management clinic.  He should try to get insurance in place ASAP so we can look at options above.

## 2013-09-09 NOTE — Telephone Encounter (Signed)
Patient Information:  Caller Name: Senan  Phone: [phone]  Patient: Eric Hood  Gender: Male  DOB: Mar 14, 1981  Age: 32 Years  PCP: Evelena Peat [facility])  Office Follow Up:  Does the office need to follow up with this patient?: Yes  Instructions For The Office: Please contact patient regarding back pain.  Looking for longer lasting pain relief.  Does not want anything stronger.Marland KitchenMarland KitchenMarland KitchenAnything else?  RN Note:  Pharmacy- [facility] [place].  Please contact.  Patient having back pain.  Symptoms  Reason For Call & Symptoms: Patinet in the office yesterday and given Hydromorphone 2mg   for back pain. (chronic pain x6 years)   He states he taken 4mg  twice and it is not making any differnence.  Location- between shoulder blades/ back pain .  No insurance.  He would like to know if there is anything similar to the Hydromorphone he can try.  He does not want anything stronger but the pain is not better. "sharp pain now with a burn".   Looking for guidance.  Reviewed Health History In EMR: Yes  Reviewed Medications In EMR: Yes  Reviewed Allergies In EMR: Yes  Reviewed Surgeries / Procedures: Yes  Date of Onset of Symptoms: 09/09/2013  Guideline(s) Used:  Back Pain  Disposition Per Guideline:   See Within 2 Weeks in Office  Reason For Disposition Reached:   Back pain is a chronic symptom (recurrent or ongoing AND lasting > 4 weeks)  Advice Given:  Reassurance:  Twisting or heavy lifting can cause back pain.  Cold or Heat:  Cold Pack: For pain or swelling, use a cold pack or ice wrapped in a wet cloth. Put it on the sore area for 20 minutes. Repeat 4 times on the first day, then as needed.  Heat Pack: If pain lasts over 2 days, apply heat to the sore area. Use a heat pack, heating pad, or warm wet washcloth. Do this for 10 minutes, then as needed. For widespread stiffness, take a hot bath or hot shower instead. Move the sore area under the warm water.  Sleep:  Sleep on your  side with a pillow between your knees. If you sleep on your back, put a pillow under your knees.  Avoid sleeping on your stomach.  Your mattress should be firm. Avoid waterbeds.  Call Back If:  Pain lasts for more than 2 weeks  You become worse.  RN Overrode Recommendation:  Follow Up With Office Later  Please contact patient regarding back pain.  Looking for longer lasting pain relief.  Does not want anything stronger.Marland KitchenMarland KitchenMarland KitchenAnything else?

## 2013-09-09 NOTE — Telephone Encounter (Signed)
Patient is aware of his options and will be looking into going to pain management

## 2013-09-16 ENCOUNTER — Telehealth: Payer: Self-pay | Admitting: Family Medicine

## 2013-09-16 NOTE — Telephone Encounter (Signed)
Pt needs new rx hydromorphone. Pt will have health ins after 10-16-2013. Pt will go to pain management when ins started. Pt will need a referral

## 2013-09-16 NOTE — Telephone Encounter (Signed)
Last seen 09-08-13 Last refilll 09-08-13 #30 0 refill

## 2013-09-17 NOTE — Telephone Encounter (Signed)
Denied.  I have concerns about physical dependency and we have discussed this.  We don't doubt his back pain, but we do not see clear evidence from x-rays for chronic opioid use.  I strongly advise either pain management or orthopedist after he gets insurance.  I am happy to try another NSAID-such as Diclofenac 75 mg po bid (#60) if he wants.

## 2013-09-17 NOTE — Telephone Encounter (Signed)
Pt informed and does not want to take the Diclofenac stated he has had the medication in the past and it does not help

## 2013-09-19 ENCOUNTER — Encounter: Payer: Self-pay | Admitting: *Deleted

## 2013-09-22 ENCOUNTER — Ambulatory Visit (INDEPENDENT_AMBULATORY_CARE_PROVIDER_SITE_OTHER): Payer: Self-pay | Admitting: Family Medicine

## 2013-09-22 ENCOUNTER — Encounter: Payer: Self-pay | Admitting: Family Medicine

## 2013-09-22 VITALS — BP 130/78 | HR 96 | Temp 98.1°F | Wt 187.0 lb

## 2013-09-22 DIAGNOSIS — M546 Pain in thoracic spine: Secondary | ICD-10-CM

## 2013-09-22 MED ORDER — AMITRIPTYLINE HCL 10 MG PO TABS: 10.0000 mg | ORAL_TABLET | Freq: Every day | ORAL | Status: DC

## 2013-09-22 NOTE — Progress Notes (Signed)
   Subjective:    Patient ID: Eric Hood, male    DOB: [birth year], 32 y.o.   MRN: [id number]  HPI Is seen regarding thoracic back pain. Seen with more lumbar back pain last visit back pain is actually improved. 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 His current pain has been more of his chronic thoracic back pain upper thoracic area and radiates to the right. Some preceding numbness followed by burning type pain. No rash. No history of shingles. Previously tried gabapentin up to 600 mg 3 times a day but this did not help. He does not recall ever trying other meds such as amitriptyline. He has not tried any topicals. Previous thoracic back films have been unremarkable.  We recently did prescribe some low-dose hydromorphone which does help but we've cautioned against regular use. He plans to get insurance after the first year. He previously saw orthopedist who also got x-rays and he cannot recall who that was  Past Medical History  Diagnosis Date  . Back pain    Past Surgical History  Procedure Laterality Date  . Hand surgery Left     reports that he has never smoked. He does not have any smokeless tobacco history on file. He reports that he does not use illicit drugs. His alcohol history is not on file. family history includes Hypertension in his father and mother. No Known Allergies    Review of Systems  Constitutional: Negative for fever, chills, appetite change and unexpected weight change.  Musculoskeletal: Positive for back pain.  Skin: Negative for rash.       Objective:   Physical Exam  Constitutional: He appears well-developed and well-nourished.  Cardiovascular: Normal rate.   Pulmonary/Chest: Effort normal and breath sounds normal. No respiratory distress. He has no wheezes. He has no rales.  Musculoskeletal:  Patient has no reproducible back tenderness thoracic or muscular spine  Skin: No rash noted.          Assessment & Plan:    Chronic thoracic back pain. Question neuropathic. Previous poor response to gabapentin. Try low-dose amitriptyline 10 mg each bedtime. He'll try over-the-counter Zostrix cream twice a day to 3 times a day. We have discussed other options such as Cymbalta and Lyrica but he currently has no insurance.  We have again stressed recommendation to try to avoid long term opioids for this type of pain.  We will consider orthopedic consult after his insurance goes into effect.

## 2013-09-22 NOTE — Patient Instructions (Signed)
Try over the counter Zostrix cream- which can block nerve pain May titrate the amitriptyline up to 2 tablets at night after 3-4 days if no improvement.

## 2013-09-22 NOTE — Progress Notes (Signed)
Pre visit review using our clinic review tool, if applicable. No additional management support is needed unless otherwise documented below in the visit note. 

## 2013-10-10 ENCOUNTER — Other Ambulatory Visit: Payer: Self-pay | Admitting: Family Medicine

## 2013-10-13 NOTE — Telephone Encounter (Signed)
Last visit 09/22/13 Last refill 07-11-13 #15 1 refill

## 2013-10-14 NOTE — Telephone Encounter (Signed)
Refill once. Try to avoid regular use to avoid dependency.

## 2013-11-20 ENCOUNTER — Other Ambulatory Visit: Payer: Self-pay | Admitting: Family Medicine

## 2013-11-21 NOTE — Telephone Encounter (Signed)
Last visit 09/22/13 Last refill 10/10/13 #15 0

## 2013-11-23 NOTE — Telephone Encounter (Signed)
Refill once only.  Should not be taking regularly.

## 2013-12-11 ENCOUNTER — Ambulatory Visit: Payer: Self-pay | Admitting: Family Medicine

## 2013-12-12 ENCOUNTER — Ambulatory Visit (INDEPENDENT_AMBULATORY_CARE_PROVIDER_SITE_OTHER): Payer: 59 | Admitting: Family Medicine

## 2013-12-12 ENCOUNTER — Encounter: Payer: Self-pay | Admitting: Family Medicine

## 2013-12-12 VITALS — BP 132/76 | HR 98 | Temp 98.6°F | Wt 181.0 lb

## 2013-12-12 DIAGNOSIS — M791 Myalgia, unspecified site: Secondary | ICD-10-CM

## 2013-12-12 DIAGNOSIS — IMO0002 Reserved for concepts with insufficient information to code with codable children: Secondary | ICD-10-CM

## 2013-12-12 DIAGNOSIS — M549 Dorsalgia, unspecified: Secondary | ICD-10-CM

## 2013-12-12 DIAGNOSIS — M546 Pain in thoracic spine: Secondary | ICD-10-CM

## 2013-12-12 MED ORDER — METHYLPREDNISOLONE ACETATE 40 MG/ML IJ SUSP: 20.0000 mg | Freq: Once | INTRAMUSCULAR | Status: DC

## 2013-12-12 MED ORDER — METHYLPREDNISOLONE ACETATE 40 MG/ML IJ SUSP
40.0000 mg | Freq: Once | INTRAMUSCULAR | Status: AC
  Administered 2013-12-12: 40 mg via INTRA_ARTICULAR

## 2013-12-12 NOTE — Patient Instructions (Signed)
Let me know by Monday if back pain no better.
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
## 2013-12-12 NOTE — Progress Notes (Signed)
Pre visit review using our clinic review tool, if applicable. No additional management support is needed unless otherwise documented below in the visit note. 

## 2013-12-17 ENCOUNTER — Telehealth: Payer: Self-pay | Admitting: Family Medicine

## 2013-12-17 DIAGNOSIS — M546 Pain in thoracic spine: Secondary | ICD-10-CM

## 2013-12-17 NOTE — Telephone Encounter (Signed)
Pt is aware waiting on md °

## 2013-12-17 NOTE — Telephone Encounter (Signed)
Pt called to notify you the shot he received in his back on Friday did not work.   He would like to know if he could have a re-fill HYDROmorphone (DILAUDID) 2 MG tablet

## 2013-12-18 ENCOUNTER — Other Ambulatory Visit: Payer: Self-pay | Admitting: Family Medicine

## 2013-12-18 NOTE — Telephone Encounter (Signed)
Last visit 12/12/13 Last refill 11/20/13 #15 0 refill

## 2013-12-18 NOTE — Telephone Encounter (Signed)
Pt informed and referral is ordered

## 2013-12-18 NOTE — Telephone Encounter (Signed)
Refill once 

## 2013-12-18 NOTE — Telephone Encounter (Signed)
I recommend setting up appointment with our pain management team with Dr Letta Pate, et al.  He has had this chronic pain for many years and before starting chronic opioids I feel in his best interest to get opinion from pain management.

## 2014-01-02 ENCOUNTER — Encounter (HOSPITAL_COMMUNITY): Payer: Self-pay | Admitting: Emergency Medicine

## 2014-01-02 ENCOUNTER — Emergency Department (HOSPITAL_COMMUNITY)
Admission: EM | Admit: 2014-01-02 | Discharge: 2014-01-02 | Disposition: A | Discharge status: Home / Self Care | Payer: 59 | Attending: Emergency Medicine | Admitting: Emergency Medicine

## 2014-01-02 DIAGNOSIS — F411 Generalized anxiety disorder: Secondary | ICD-10-CM | POA: Insufficient documentation

## 2014-01-02 DIAGNOSIS — G8929 Other chronic pain: Secondary | ICD-10-CM | POA: Insufficient documentation

## 2014-01-02 DIAGNOSIS — Z8739 Personal history of other diseases of the musculoskeletal system and connective tissue: Secondary | ICD-10-CM | POA: Insufficient documentation

## 2014-01-02 DIAGNOSIS — F1120 Opioid dependence, uncomplicated: Secondary | ICD-10-CM | POA: Insufficient documentation

## 2014-01-02 MED ORDER — CLONIDINE HCL 0.1 MG PO TABS: 0.1000 mg | ORAL_TABLET | Freq: Once | ORAL | Status: DC

## 2014-01-02 MED ORDER — CLONIDINE HCL 0.2 MG PO TABS: 0.2000 mg | ORAL_TABLET | Freq: Three times a day (TID) | ORAL | Status: DC | PRN

## 2014-01-02 NOTE — Discharge Instructions (Signed)
Call [facility] or [facility] regarding getting into detox program there.   Follow up with your doctor.   Stop using drugs.   Return to ER if you have fever, shakiness, vomiting.   [facility]- [phone]  [facility] [place] [phone]    Emergency Department Resource Guide 1) Find a Doctor and Pay Out of Pocket Although you won't have to find out who is covered by your insurance plan, it is a good idea to ask around and get recommendations. You will then need to call the office and see if the doctor you have chosen will accept you as a new patient and what types of options they offer for patients who are self-pay. Some doctors offer discounts or will set up payment plans for their patients who do not have insurance, but you will need to ask so you aren't surprised when you get to your appointment.  2) Contact Your Local Health Department Not all health departments have doctors that can see patients for sick visits, but many do, so it is worth a call to see if yours does. If you don't know where your local health department is, you can check in your phone book. The CDC also has a tool to help you locate your state's health department, and many state websites also have listings of all of their local health departments.  3) Find a [facility] If your illness is not likely to be very severe or complicated, you may want to try a walk in clinic. These are popping up all over the country in pharmacies, drugstores, and shopping centers. They're usually staffed by nurse practitioners or physician assistants that have been trained to treat common illnesses and complaints. They're usually fairly quick and inexpensive. However, if you have serious medical issues or chronic medical problems, these are probably not your best option.  No Primary Care Doctor: - Call Health Connect at  [phone] - they can help you locate a primary care doctor that   accepts your insurance, provides certain services, etc. - Physician Referral Service- [phone]  Chronic Pain Problems: Organization         Address  Phone   Notes  [facility]  [phone] Patients need to be referred by their primary care doctor.   Medication Assistance: Organization         Address  Phone   Notes  [facility] Medication [facility] [address] [place], [place] [address] [phone] --Must be a resident of [facility] -- Must have NO insurance coverage whatsoever (no Medicaid/ Medicare, etc.) -- The pt. MUST have a primary care doctor that directs their care regularly and follows them in the community   [facility]  [phone]   [employer]  [phone]    Agencies that provide inexpensive medical care: Organization         Address  Phone   Notes  [place]  [phone]   [facility]    [phone]   [facility] [place], [place] [address] [phone]   [place] [address] [place] [phone]   [facility]anned [facility]    [phone]   [facility]    [phone]   [place] and [address], [place] Phone:  [phone], Fax:  [phone] Hours of Operation:  9 am - 6 pm, M-F.  Also accepts Medicaid/Medicare and self-pay.  °[facility] ° [address], [place] Phone: ([phone]) [phone], Fax: [phone]. Hours of Operation:  8:30 am - 5:30 pm, M-F.  Also accepts Medicaid and self-pay.  °[facility] [place] [address], [place] Phone: ([phone]) [phone]   °[facility] [address], [place], [place] (336)[phone], Ext. 123 Mondays & Thursdays: 7-9 AM.  First 15 patients are seen on a first come, first serve basis. °  ° °Medicaid-accepting [place] Providers: ° °Organization          Address  Phone   Notes  °[facility] [address], [place] ([phone]) [phone] Also accepts self-pay patients.  °[facility] [address], [place] ° ([phone]) [phone]   °[facility] [address], [place] (336) [phone]   °[facility] [address] [place] [address], [place] ([phone]) [phone]   °Veita Bland [address], [place]  ° ([phone]) [phone] Only accepts [place] Access Medicaid patients after they have their name applied to their card.  ° °Self-Pay (no insurance) in [place]: ° °Organization         Address  Phone   Notes  °Sickle Cell Patients, [facility] [address], [place] ([phone]) [phone]   °[facility] [address], [place] (336) [phone]   °[facility] [place] ° [address], [place] (336) [phone]   °[facility]/Dr. Osei-Bonsu ° [address] [place] [address], [place] or [address], [place] ([phone]) [phone] Phone number for both [place] and [place] locations is the same.  °[facility] [address], [place] ([phone]) [phone]   °[facility] [address] [place] [address], [place] or [address] (336) 852-7530 °(336) 878-2260   °[facility] [address], [place] (336) 350-1642, phone; ([phone]) [phone], fax Sees patients 1st and 3rd Saturday of every month.  Must not qualify for public or private insurance (i.e. Medicaid, Medicare, [place] Health Choice, Veterans' Benefits) • Household income should be no more than 200% of the poverty level •The clinic cannot treat you if you are pregnant or think you are pregnant • Sexually transmitted diseases are not treated at the clinic.  ° ° °Dental Care: °Organization         Address  Phone  Notes  °[place] [facility] [address],  [place] (336) [phone] Accepts children up to age 21 who are enrolled in Medicaid or [place] Health Choice; pregnant women with a Medicaid card; and children who have applied for Medicaid or [place] Health Choice, but were declined, whose parents can pay a reduced fee at time of service.  °[place] [facility] [place]  [address], [place] (336) 641-7733 Accepts children up to age 21 who are enrolled in Medicaid or [place] Health Choice; pregnant women with a Medicaid card; and children who have applied for Medicaid or [place] Health Choice, but were declined, whose parents can pay a reduced fee at time of service.  °[facility] ° [address], [place] (336) [phone] Patients are seen by appointment only. Walk-ins are not accepted. [facility] will see patients 18 years   of age and older. °Monday - Tuesday (8am-5pm) °Most Wednesdays (8:30-5pm) °$30 per visit, cash only  °[facility] ° [address], [place] ([phone]) [phone] Patients are seen by appointment only. Walk-ins are not accepted. [facility] will see patients 18 years of age and older. °One Wednesday Evening (Monthly: Volunteer Based).  $30 per visit, cash only  °[facility]  ([phone]) [phone] for adults; Children under age 4, call Graduate Pediatric Dentistry at ([phone]. Children aged 4-14, please call [phone]) [phone] to request a pediatric application. ° Dental services are provided in all areas of dental care including fillings, crowns and bridges, complete and partial dentures, implants, gum treatment, root canals, and extractions. Preventive care is also provided. Treatment is provided to both adults and children. °Patients are selected via a lottery and there is often a waiting list. °  °[facility] [address], °[place] ° (336) 763-8833 [email or web address] °  °[facility] [address], [place], [place]  (336)723-1848, Ext. 123 Second and Fourth Thursday of each month, opens at 6:30 AM; Clinic ends at 9 AM.  Patients are seen on a first-come first-served basis, and a limited number are seen during each clinic.  ° °[facility] ° [address], [place], [place] (336) [phone]   Eligibility Requirements °You must have lived in [place], [place], or [place] counties for at least the last three months. °  You cannot be eligible for state or federal sponsored healthcare insurance, including Veterans Administration, Medicaid, or Medicare. °  You generally cannot be eligible for healthcare insurance through your employer.  °  How to apply: °Eligibility screenings are held every Tuesday and Wednesday afternoon from 1:00 pm until 4:00 pm. You do not need an appointment for the interview!  °[facility] [address] [place] [address], [place], [place] 336-631-2330   °[facility]  336-342-8273   °[facility]  336-703-3100   °[facility]  336-570-6415   ° °Behavioral Health Resources in the Community: °Intensive Outpatient Programs °Organization         Address  Phone  Notes  °[facility] [address], [place], [place] 336-878-6098   °[facility] [address], [place], [place] 336-832-9800   °[facility] [address], [place], [place] ° 336-882-2125   °[place] [address],  °[place], [place] 1-800-853-5163 or 336-641-4981   °Substance Abuse Resources °Organization         Address  Phone  Notes  °[facility]  336-882-2125   °[facility]  336-784-9470   °[facility]  336-285-9073   °[facility]  336-845-3988   °Residential & Outpatient Substance Abuse Program  1-800-659-3381   °Psychological Services °Organization         Address  Phone  Notes  °[facility]  336- 832-9600   °[facility]  336- 378-7881    °[facility] [address], [place] 1-800-853-5163 or 336-641-4981   ° °Mobile Crisis Teams °Organization         Address  Phone  Notes  °[facility], Mobile Crisis Care Unit  1-877-626-1772   °Assertive °[facility] ° [address] [place], [place] 336-834-9664   °Sharon DeEsch [address] °[place] 336-554-5454   ° °Self-Help/Support Groups °Organization           Address  Phone             Notes  [place] [facility] [place] - variety of support groups  [place] Call for more information  Narcotics Anonymous (NA), [facility] [address], [employer] [place]  2 meetings at this location   Special educational needs teacher         Address  Phone  Notes  [facility] [place],    [place]  1-(469)477-4289   [facility]  [address] [place] [address], [place], [place]   [place], [place] [phone] Admissions: 8am-3pm M-F  [facility] Substance [place] [address],    [place], [place] [phone]   [facility] [address] [place], [place], [place]   [facility] [address]  [place], [place]   [facility] - Intensive Outpatient [address], [place], [place]   [facility] ([place].,  [place], [place] 1-(574)711-1611 or 934-106-0999   [facility] ([facility] [address] [place], [place] Accepts Medicaid  [facility] [address]  [place] 1-432-012-6004 Substance Abuse/Addiction Treatment   [facility] Organization         Address  Phone  Notes  [facility]  803-823-1817   Domenic Schwab, PhD [address] [place], [place]   513-071-9308 or 9055146489   [place], [place] [phone]   [facility] [address] [place], [place] [phone] Insurance/Medicaid/sponsorship through [facility] [address]                                    [place], [place] [phone] [place] [address], [place] [phone]    Dr. Adele Schilder  [phone]   [facility] Dept. 1) [address], [place] 2) [place] 3)  [address], [place] [phone]  [phone]   [place] [phone] or [phone] (After Hours)

## 2014-01-02 NOTE — ED Notes (Addendum)
Pt presents wanting detox from narcotics.  Pt sts narcotic abuse x 7 years.  Pt reports taking Percocet 10/325 x 10 yesterday and suboxone this morning.  Medications are not prescribed to PT.  Denies SI/HI and hallucinations.

## 2014-01-02 NOTE — ED Provider Notes (Signed)
CSN: [id number]     Arrival date & time 01/02/14  0900 History   First MD Initiated Contact with Patient 01/02/14 [phone]     Chief Complaint  Patient presents with  . Medical Clearance     (Consider location/radiation/quality/duration/timing/severity/associated sxs/prior Treatment) The history is provided by the patient.  Eric Hood is a 33 y.o. male hx of chronic back pain here with opiate detox. Has been abusing percocet and roxicodone and methadone for 7 years. Now wants to detox. Has been on xanax but hasn't been taking it. Denies other drug use. Denies suicidal or homicidal ideations.    Past Medical History  Diagnosis Date  . Back pain    Past Surgical History  Procedure Laterality Date  . Hand surgery Left    Family History  Problem Relation Age of Onset  . Hypertension Mother   . Hypertension Father    History  Substance Use Topics  . Smoking status: Never Smoker   . Smokeless tobacco: Not on file  . Alcohol Use: No    Review of Systems  Neurological: Positive for tremors.  All other systems reviewed and are negative.      Allergies  Review of patient's allergies indicates no known allergies.  Home Medications   Current Outpatient Rx  Name  Route  Sig  Dispense  Refill  . ALPRAZolam (XANAX) 1 MG tablet   Oral   Take 1 mg by mouth daily as needed for anxiety.         . cloNIDine (CATAPRES) 0.2 MG tablet   Oral   Take 1 tablet (0.2 mg total) by mouth 3 (three) times daily as needed.   15 tablet   0    BP 125/69  Pulse 75  Temp(Src) 98.7 F (37.1 C) (Oral)  Resp 16  SpO2 100% Physical Exam  Nursing note and vitals reviewed. Constitutional: He is oriented to person, place, and time. He appears well-developed and well-nourished.  Slightly anxious   HENT:  Head: Normocephalic.  Eyes: Conjunctivae are normal. Pupils are equal, round, and reactive to light.  Neck: Normal range of motion. Neck supple.  Cardiovascular: Normal rate, regular  rhythm and normal heart sounds.   Pulmonary/Chest: Effort normal and breath sounds normal. No respiratory distress. He has no wheezes. He has no rales.  Abdominal: Soft. Bowel sounds are normal. He exhibits no distension. There is no tenderness. There is no rebound and no guarding.  Musculoskeletal: Normal range of motion.  Neurological: He is alert and oriented to person, place, and time. No cranial nerve deficit. Coordination normal.  No tremors   Skin: Skin is warm and dry.  Psychiatric: He has a normal mood and affect. His behavior is normal. Judgment and thought content normal.    ED Course  Procedures (including critical care time) Labs Review Labs Reviewed - No data to display Imaging Review No results found.   EKG Interpretation None      MDM   Final diagnoses:  Opiate dependence   Eric Hood is a 33 y.o. male here with opiate detox. I called behavioral health and talked to counselors. [facility] doesn't do opiate detox. He is not tachycardic and no tremors right now. Will d/c home with resources and clonidine prn.      Wandra Arthurs, MD 01/02/14 1045

## 2014-01-02 NOTE — ED Notes (Signed)
Pt escorted to discharge window. Verbalized understanding discharge instructions. In no acute distress.   

## 2014-01-05 ENCOUNTER — Other Ambulatory Visit: Payer: Self-pay | Admitting: Family Medicine

## 2014-01-05 NOTE — Telephone Encounter (Signed)
Last visit 12/12/13 Last refill 11/20/13 #15 0 refill

## 2014-01-06 NOTE — Telephone Encounter (Signed)
Denied.  Pt has hx of polysubstance abuse and needs to see addiction counselor.  Options would be [facility] or [facility] (which provides inpatient detox)-and outpatient programs.

## 2014-01-06 NOTE — Telephone Encounter (Signed)
Denied.  Pt has h

## 2014-01-06 NOTE — Telephone Encounter (Signed)
Pt informed

## 2014-03-16 DIAGNOSIS — K222 Esophageal obstruction: Secondary | ICD-10-CM

## 2014-03-16 DIAGNOSIS — K2210 Ulcer of esophagus without bleeding: Secondary | ICD-10-CM

## 2014-03-16 HISTORY — DX: Ulcer of esophagus without bleeding: K22.10

## 2014-03-16 HISTORY — DX: Esophageal obstruction: K22.2

## 2014-03-20 ENCOUNTER — Encounter: Payer: Self-pay | Admitting: Family Medicine

## 2014-03-20 ENCOUNTER — Ambulatory Visit (INDEPENDENT_AMBULATORY_CARE_PROVIDER_SITE_OTHER): Payer: 59 | Admitting: Family Medicine

## 2014-03-20 VITALS — BP 128/82 | HR 95 | Wt 177.0 lb

## 2014-03-20 DIAGNOSIS — M549 Dorsalgia, unspecified: Secondary | ICD-10-CM

## 2014-03-20 DIAGNOSIS — R1310 Dysphagia, unspecified: Secondary | ICD-10-CM

## 2014-03-20 DIAGNOSIS — G8929 Other chronic pain: Secondary | ICD-10-CM

## 2014-03-20 MED ORDER — PANTOPRAZOLE SODIUM 40 MG PO TBEC: 40.0000 mg | DELAYED_RELEASE_TABLET | Freq: Every day | ORAL | Status: DC

## 2014-03-20 NOTE — Progress Notes (Signed)
   Subjective:    Patient ID: Eric Hood, male    DOB: [birth year], 33 y.o.   MRN: [id number]  HPI Patient seen for the following  Solid food dysphagia over the past 4-6 months. Symptoms somewhat progressive. He has noted this with various types of solid foods and even sometimes soft foods such as rice. No liquid dysphagia. Progressive in frequency. Has some sensation of dysphagia now about 75% of time with eating. Occasional pain with swallowing. He had some mild weight loss in recent months. Mild decline in appetite. He does have prior history of some GERD symptoms. Periodically takes Pepcid. No alcohol use (but has used in past). Minimal caffeine use.  Chronic back pain. He's had both thoracic and lumbar back pain. Somewhat poorly localized. X-rays in the past have failed to reveal clear etiology.  Denies any recent injury. He has taken multiple medications in the past including nonsteroidals and muscle relaxers without improvement. There was note from emergency department back in March that patient presented for opioid detox. However, patient adamantly denies that he was taking any regular opioids and states he was simply there for evaluation for pain.  Past Medical History  Diagnosis Date  . Back pain    Past Surgical History  Procedure Laterality Date  . Hand surgery Left     reports that he has never smoked. He does not have any smokeless tobacco history on file. He reports that he does not drink alcohol or use illicit drugs. family history includes Hypertension in his father and mother. No Known Allergies    Review of Systems  Constitutional: Positive for appetite change. Negative for fever and chills.  HENT: Positive for trouble swallowing. Negative for voice change.   Respiratory: Negative for cough and shortness of breath.   Cardiovascular: Negative for chest pain.  Gastrointestinal: Negative for abdominal pain.  Musculoskeletal: Positive for back pain.  Skin: Negative  for rash.  Hematological: Negative for adenopathy.       Objective:   Physical Exam  Constitutional: He appears well-developed and well-nourished.  HENT:  Mouth/Throat: Oropharynx is clear and moist.  Neck: Neck supple. No thyromegaly present.  Cardiovascular: Normal rate and regular rhythm.   Pulmonary/Chest: Effort normal and breath sounds normal. No respiratory distress. He has no wheezes. He has no rales.  Abdominal: Soft. Bowel sounds are normal. He exhibits no distension and no mass. There is no tenderness. There is no rebound and no guarding.  Musculoskeletal: He exhibits no edema.  No specific spinal tenderness.  Lymphadenopathy:    He has no cervical adenopathy.  Neurological: He has normal reflexes.  No focal strength deficits.          Assessment & Plan:  #1 solid food dysphagia. Needs further evaluation. Set up GI referral. Start protonix 40 mg once daily. Chew foods well in the meantime and avoid difficult to chew meats. #2 chronic thoracic back pain of unclear etiology. He has failed to respond to conservative therapies in the past including medications or physical therapy. We had made referral to pain management and they felt he was not an appropriate candidate for their clinic for unapparent reasons.  Will try sports medicine referral for opinion regarding non-opiate options for management.

## 2014-03-20 NOTE — Progress Notes (Signed)
Pre visit review using our clinic review tool, if applicable. No additional management support is needed unless otherwise documented below in the visit note. 

## 2014-03-20 NOTE — Patient Instructions (Signed)
Dysphagia  Swallowing problems (dysphagia) occur when solids and liquids seem to stick in your throat on the way down to your stomach, or the food takes longer to get to the stomach. Other symptoms include regurgitating food, noises coming from the throat, chest discomfort with swallowing, and a feeling of fullness or the feeling of something being stuck in your throat when swallowing. When blockage in your throat is complete it may be associated with drooling.  CAUSES   Problems with swallowing may occur because of problems with the muscles. The food cannot be propelled in the usual manner into your stomach. You may have ulcers, scar tissue, or inflammation in the tube down which food travels from your mouth to your stomach (esophagus), which blocks food from passing normally into the stomach. Causes of inflammation include:  · Acid reflux from your stomach into your esophagus.  · Infection.  · Radiation treatment for cancer.  · Medicines taken without enough fluids to wash them down into your stomach.  You may have nerve problems that prevent signals from being sent to the muscles of your esophagus to contract and move your food down to your stomach. Globus pharyngeus is a relatively common problem in which there is a sense of an obstruction or difficulty in swallowing, without any physical abnormalities of the swallowing passages being found. This problem usually improves over time with reassurance and testing to rule out other causes.  DIAGNOSIS  Dysphagia can be diagnosed and its cause can be determined by tests in which you swallow a white substance that helps illuminate the inside of your throat (contrast medium) while X-rays are taken. Sometimes a flexible telescope that is inserted down your throat (endoscopy) to look at your esophagus and stomach is used.  TREATMENT   · If the dysphagia is caused by acid reflux or infection, medicines may be used.  · If the dysphagia is caused by problems with your  swallowing muscles, swallowing therapy may be used to help you strengthen your swallowing muscles.  · If the dysphagia is caused by a blockage or mass, procedures to remove the blockage may be done.  HOME CARE INSTRUCTIONS  · Try to eat soft food that is easier to swallow and check your weight on a daily basis to be sure that it is not decreasing.  · Be sure to drink liquids when sitting upright (not lying down).  SEEK MEDICAL CARE IF:  · You are losing weight because you are unable to swallow.  · You are coughing when you drink liquids (aspiration).  · You are coughing up partially digested food.  SEEK IMMEDIATE MEDICAL CARE IF:  · You are unable to swallow your own saliva .  · You are having shortness of breath or a fever, or both.  · You have a hoarse voice along with difficulty swallowing.  MAKE SURE YOU:  · Understand these instructions.  · Will watch your condition.  · Will get help right away if you are not doing well or get worse.  Document Released: 09/29/2000 Document Revised: 06/04/2013 Document Reviewed: 03/21/2013  [employer]® Patient Information ©2014 [employer].

## 2014-03-23 ENCOUNTER — Telehealth: Payer: Self-pay | Admitting: Family Medicine

## 2014-03-23 NOTE — Telephone Encounter (Signed)
Pt states he got his dr's name changed on his card to dr burchette Pt given a reference number for referral    [phone]

## 2014-03-27 ENCOUNTER — Ambulatory Visit (INDEPENDENT_AMBULATORY_CARE_PROVIDER_SITE_OTHER): Payer: 59 | Admitting: Family Medicine

## 2014-03-27 VITALS — BP 134/82 | HR 99 | Ht 72.0 in | Wt 182.0 lb

## 2014-03-27 DIAGNOSIS — M9981 Other biomechanical lesions of cervical region: Secondary | ICD-10-CM

## 2014-03-27 DIAGNOSIS — M899 Disorder of bone, unspecified: Secondary | ICD-10-CM

## 2014-03-27 DIAGNOSIS — M546 Pain in thoracic spine: Secondary | ICD-10-CM

## 2014-03-27 DIAGNOSIS — M949 Disorder of cartilage, unspecified: Secondary | ICD-10-CM

## 2014-03-27 DIAGNOSIS — M999 Biomechanical lesion, unspecified: Secondary | ICD-10-CM

## 2014-03-27 NOTE — Patient Instructions (Addendum)
Very nice to meet you Heels, butt shoulder and head on wall for total of 5 minutes daily Two tennis balls duct tape together and lay on floor with them at junction of neck and head.  Look at handout and do exercises 3 times a week.  Vitamin D 2000 IU daily.  Tumeric 500mg  twice daily. Will decrease inflammation in your diet.  Consider iron 325 mg daily.  Come back in 3 weeks.   Scapular Winging  with Rehab  Scapular winging syndrome is also known as serratus anterior palsy or long thoracic nerve injury. The condition is an uncommon injury to the nervous system. The condition is caused by injury to the long thoracic nerve that runs through the neck and shoulder. Injury to the shoulder, such as a fall or repetitive stress on the shoulder causes the nerve to become stretched. Occasionally the injury is the result of an infection of the nerve. Damage to the long thoracic nerve results in weakness of the serratus anterior muscle. The serratus anterior muscle is responsible for controlling the shoulder blade (scapula). Weakness in this muscle results in a instability (winging) of the scapula. SYMPTOMS   Pain and weakness in the shoulder (usually the back of the shoulder) that is often diffuse or unable to localize.  Loss of or decrease in shoulder function.  Upper back pain while sitting, due to the scapula pressing on the back of the chair.  Visible deformity in the back of the shoulder. CAUSES  Scapular winging is caused by stretching of the long thoracic nerve. Common mechanisms of injury include:  Viral illness.  Repetitive and/or stressful use of the shoulder.  Falling onto the shoulder with the head and neck stretched away from the shoulder. RISK INCREASES WITH:  Contact sports (football, rugby, lacrosse, or soccer).  Activities involving overhead arm movement (baseball, volleyball, or racquet sports).  Poor strength and flexibility. PREVENTION  Warm up and stretch properly  before activity.  Allow for adequate recovery between workouts.  Maintain physical fitness:  Strength, flexibility, and endurance.  Cardiovascular fitness.  Learn and use proper technique. When possible, have a coach correct improper technique. PROGNOSIS  Scapular winging normally resolves spontaneously within 18 months. In rare circumstances surgery is recommended.  RELATED COMPLICATIONS   Permanent nerve damage, including pain, numbness, tingle, or weakness.  Shoulder weakness.  Recurrent shoulder pain.  Inability to compete in athletics. TREATMENT Treatment initially involves resting from any activities that aggravate your symptoms. The use of ice and medication may help reduce pain and inflammation. The use of strengthening and stretching exercises may help reduce pain with activity, specifically shoulder exercises that improve range of motion. These exercises may be performed at home or with referral to a therapist. If symptoms persist for greater than 6 months despite non-surgical (conservative) treatment, then surgery may be recommended. Surgery is only used for the most serious cases and the purpose is to regain function, not to allow an athlete to return to sports. MEDICATION   If pain medication is necessary, then nonsteroidal anti-inflammatory medications, such as aspirin and ibuprofen, or other minor pain relievers, such as acetaminophen, are often recommended.  Do not take pain medication for 7 days before surgery.  Prescription pain relievers may be given if deemed necessary by your caregiver. Use only as directed and only as much as you need. HEAT AND COLD  Cold treatment (icing) relieves pain and reduces inflammation. Cold treatment should be applied for 10 to 15 minutes every 2 to 3  hours for inflammation and pain and immediately after any activity that aggravates your symptoms. Use ice packs or massage the area with a piece of ice (ice massage).  Heat treatment  may be used prior to performing the stretching and strengthening activities prescribed by your caregiver, physical therapist, or athletic trainer. Use a heat pack or soak the injury in warm water. SEEK MEDICAL CARE IF:  Treatment seems to offer no benefit, or the condition worsens.  Any medications produce adverse side effects. EXERCISES  RANGE OF MOTION (ROM) AND STRETCHING EXERCISES - Scapular Winging (Serratus Anterior Palsy, Long Thoracic Nerve Injury)  These exercises may help you when beginning to rehabilitate your injury. Your symptoms may resolve with or without further involvement from your physician, physical therapist or athletic trainer. While completing these exercises, remember:   Restoring tissue flexibility helps normal motion to return to the joints. This allows healthier, less painful movement and activity.  An effective stretch should be held for at least 30 seconds.  A stretch should never be painful. You should only feel a gentle lengthening or release in the stretched tissue. ROM - Pendulum  Bend at the waist so that your right / left arm falls away from your body. Support yourself with your opposite hand on a solid surface, such as a table or a countertop.  Your right / left arm should be perpendicular to the ground. If it is not perpendicular, you need to lean over farther. Relax the muscles in your right / left arm and shoulder as much as possible.  Gently sway your hips and trunk so they move your right / left arm without any use of your right / left shoulder muscles.  Progress your movements so that your right / left arm moves side to side, then forward and backward, and finally, both clockwise and counterclockwise.  Complete __________ repetitions in each direction. Many people use this exercise to relieve discomfort in their shoulder as well as to gain range of motion. Repeat __________ times. Complete this exercise __________ times per day. STRETCH  Flexion,  Seated   Sit in a firm chair so that your right / left forearm can rest on a table or on a table or countertop. Your right / left elbow should rest below the height of your shoulder so that your shoulder feels supported and not tense or uncomfortable.  Keeping your right / left shoulder relaxed, lean forward at your waist, allowing your right / left hand to slide forward. Bend forward until you feel a moderate stretch in your shoulder, but before you feel an increase in your pain.  Hold __________ seconds. Slowly return to your starting position. Repeat __________ times. Complete this exercise __________ times per day.  STRETCH  Flexion, Standing  Stand with good posture. With an underhand grip on your right / left and an overhand grip on the opposite hand, grasp a broomstick or cane so that your hands are a little more than shoulder-width apart.  Keeping your right / left elbow straight and shoulder muscles relaxed, push the stick with your opposite hand to raise your right / left arm in front of your body and then overhead. Raise your arm until you feel a stretch in your right / left shoulder, but before you have increased shoulder pain.  Avoid shrugging your right / left shoulder as your arm rises by keeping your shoulder blade tucked down and toward your mid-back spine. Hold __________ seconds.  Slowly return to the starting position.  Repeat __________ times. Complete this exercise __________ times per day. STRETCH  Abduction, Supine  Stand with good posture. With an underhand grip on your right / left and an overhand grip on the opposite hand, grasp a broomstick or cane so that your hands are a little more than shoulder-width apart.  Keeping your right / left elbow straight and shoulder muscles relaxed, push the stick with your opposite hand to raise your right / left arm out to the side of your body and then overhead. Raise your arm until you feel a stretch in your right / left shoulder,  but before you have increased shoulder pain.  Avoid shrugging your right / left shoulder as your arm rises by keeping your shoulder blade tucked down and toward your mid-back spine. Hold __________ seconds.  Slowly return to the starting position. Repeat __________ times. Complete this exercise __________ times per day. ROM  Flexion, Active-Assisted  Lie on your back. You may bend your knees for comfort.  Grasp a broomstick or cane so your hands are about shoulder-width apart. Your right / left hand should grip the end of the stick/cane so that your hand is positioned "thumbs-up," as if you were about to shake hands.  Using your healthy arm to lead, raise your right / left arm overhead until you feel a gentle stretch in your shoulder. Hold __________ seconds.  Use the stick/cane to assist in returning your right / left arm to its starting position. Repeat __________ times. Complete this exercise __________ times per day.  STRENGTHENING EXERCISES - Scapular Winging (Serratus Anterior Palsy, Long Thoracic Nerve Injury) These exercises may help you when beginning to rehabilitate your injury. They may resolve your symptoms with or without further involvement from your physician, physical therapist or athletic trainer. While completing these exercises, remember:   Muscles can gain both the endurance and the strength needed for everyday activities through controlled exercises.  Complete these exercises as instructed by your physician, physical therapist or athletic trainer. Progress with the resistance and repetition exercises only as your caregiver advises.  You may experience muscle soreness or fatigue, but the pain or discomfort you are trying to eliminate should never worsen during these exercises. If this pain does worsen, stop and make certain you are following the directions exactly. If the pain is still present after adjustments, discontinue the exercise until you can discuss the trouble  with your clinician.  During your recovery, avoid activity or exercises which involve actions that place your injured hand or elbow above your head or behind your back or head. These positions stress the tissues which are trying to heal. STRENGTH - Scapular Depression and Adduction   With good posture, sit on a firm chair. Supported your arms in front of you with pillows, arm rests or a table top. Have your elbows in line with the sides of your body.  Gently draw your shoulder blades down and toward your mid-back spine. Gradually increase the tension without tensing the muscles along the top of your shoulders and the back of your neck.  Hold for __________ seconds. Slowly release the tension and relax your muscles completely before completing the next repetition.  After you have practiced this exercise, remove the arm support and complete it in standing as well as sitting. Repeat __________ times. Complete this exercise __________ times per day.  STRENGTH - Scapular Protractors, Standing   Stand arms-length away from a wall. Place your hands on the wall, keeping your elbows straight.  Begin  by dropping your shoulder blades down and toward your mid-back spine.  To strengthen your protractors, keep your shoulder blades down, but slide them forward on your rib cage. It will feel as if you are lifting the back of your rib cage away from the wall. This is a subtle motion and can be challenging to complete. Ask your clinician for further instruction if you are not sure you are doing the exercise correctly.  Hold for __________ seconds. Slowly return to the starting position, resting the muscles completely before completing the next repetition. Repeat __________ times. Complete this exercise __________ times per day. STRENGTH - Scapular Protractors, Supine  Lie on your back on a firm surface. Extend your right / left arm straight into the air while holding a __________ weight in your  hand.  Keeping your head and back in place, lift your shoulder off the floor.  Hold __________ seconds. Slowly return to the starting position and allow your muscles to relax completely before completing the next repetition. Repeat __________ times. Complete this exercise __________ times per day. STRENGTH - Scapular Protractors, Quadruped  Get onto your hands and knees with your shoulders directly over your hands (or as close as you comfortably can be).  Keeping your elbows locked, lift the back of your rib cage up into your shoulder blades so your mid-back rounds-out. Keep your neck muscles relaxed.  Hold this position for __________ seconds. Slowly return to the starting position and allow your muscles to relax completely before completing the next repetition. Repeat __________ times. Complete this exercise __________ times per day.  STRENGTH  Scapular Depressors  Keeping your feet on the floor, lift your bottom from the seat and lock your elbows.  Keeping your elbows straight, allow gravity to pull your body weight down. Your shoulders will rise toward your ears.  Raise your body against gravity by drawing your shoulder blades down your back, shortening the distance between your shoulders and ears. Although your feet should always maintain contact with the floor, your feet should progressively support less body weight as you get stronger.  Hold __________ seconds. In a controlled and slow manner, lower your body weight to begin the next repetition. Repeat __________ times. Complete this exercise __________ times per day.  STRENGTH - Shoulder Extensors, Prone  Lie on your stomach on a firm surface so that your right / left arm overhangs the edge. Rest your forehead on your opposite forearm. With your thumb facing away from your body and your elbow straight, hold a __________ weight in your hand.  Squeeze your right / left shoulder blade to your mid-back spine and then slowly raise your  arm behind you to the height of the bed.  Hold for __________ seconds. Slowly reverse the directions and return to the starting position, controlling the weight as you lower your arm. Repeat __________ times. Complete this exercise __________ times per day.  STRENGTH - Horizontal Abductors Choose one of the two oppositions to complete this exercise. Prone: lying on stomach:  Lie on your stomach on a firm surface so that your right / left arm overhangs the edge. Rest your forehead on your opposite forearm. With your palm facing the floor and your elbow straight, hold a __________ weight in your hand.  Squeeze your right / left shoulder blade to your mid-back spine and then slowly raise your arm to the height of the bed.  Hold for __________ seconds. Slowly reverse the directions and return to the starting position, controlling  the weight as you lower your arm. Repeat __________ times. Complete this exercise __________ times per day. Standing:  Secure a rubber exercise band/tubing so that it is at the height of your shoulders when you are either standing or sitting on a firm arm-less chair.  Grasp an end of the band/tubing in each hand and have your palms face each other. Straighten your elbows and lift your hands straight in front of you at shoulder height. Step back away from the secured end of band/tubing until it becomes tense.  Squeeze your shoulder blades together. Keeping your elbows locked and your hands at shoulder-height, bring your hands out to your side.  Hold __________ seconds. Slowly ease the tension on the band/tubing as you reverse the directions and return to the starting position. Repeat __________ times. Complete this exercise __________ times per day. STRENGTH - Scapular Retractors  Secure a rubber exercise band/tubing so that it is at the height of your shoulders when you are either standing or sitting on a firm arm-less chair.  With a palm-down grip, grasp an end of  the band/tubing in each hand. Straighten your elbows and lift your hands straight in front of you at shoulder height. Step back away from the secured end of band/tubing until it becomes tense.  Squeezing your shoulder blades together, draw your elbows back as you bend them. Keep your upper arm lifted away from your body throughout the exercise.  Hold __________ seconds. Slowly ease the tension on the band/tubing as you reverse the directions and return to the starting position. Repeat __________ times. Complete this exercise __________ times per day. STRENGTH - Shoulder Extensors   Secure a rubber exercise band/tubing so that it is at the height of your shoulders when you are either standing or sitting on a firm arm-less chair.  With a thumbs-up grip, grasp an end of the band/tubing in each hand. Straighten your elbows and lift your hands straight in front of you at shoulder height. Step back away from the secured end of band/tubing until it becomes tense.  Squeezing your shoulder blades together, pull your hands down to the sides of your thighs. Do not allow your hands to go behind you.  Hold for __________ seconds. Slowly ease the tension on the band/tubing as you reverse the directions and return to the starting position. Repeat __________ times. Complete this exercise __________ times per day.  STRENGTH - Scapular Retractors and External Rotators  Secure a rubber exercise band/tubing so that it is at the height of your shoulders when you are either standing or sitting on a firm arm-less chair.  With a palm-down grip, grasp an end of the band/tubing in each hand. Bend your elbows 90 degrees and lift your elbows to shoulder height at your sides. Step back away from the secured end of band/tubing until it becomes tense.  Squeezing your shoulder blades together, rotate your shoulder so that your upper arm and elbow remain stationary, but your fists travel upward to head-height.  Hold  __________ for seconds. Slowly ease the tension on the band/tubing as you reverse the directions and return to the starting position. Repeat __________ times. Complete this exercise __________ times per day.  STRENGTH - Scapular Retractors and External Rotators, Rowing  Secure a rubber exercise band/tubing so that it is at the height of your shoulders when you are either standing or sitting on a firm arm-less chair.  With a palm-down grip, grasp an end of the band/tubing in each hand. Straighten  your elbows and lift your hands straight in front of you at shoulder height. Step back away from the secured end of band/tubing until it becomes tense.  Step 1: Squeeze your shoulder blades together. Bending your elbows, draw your hands to your chest as if you are rowing a boat. At the end of this motion, your hands and elbow should be at shoulder-height and your elbows should be out to your sides.  Step 2: Rotate your shoulder to raise your hands above your head. Your forearms should be vertical and your upper-arms should be horizontal.  Hold for __________ seconds. Slowly ease the tension on the band/tubing as you reverse the directions and return to the starting position. Repeat __________ times. Complete this exercise __________ times per day.  STRENGTH - Scapular Retractors and Elevators  Secure a rubber exercise band/tubing so that it is at the height of your shoulders when you are either standing or sitting on a firm arm-less chair.  With a thumbs-up grip, grasp an end of the band/tubing in each hand. Step back away from the secured end of band/tubing until it becomes tense.  Squeezing your shoulder blades together, straighten your elbows and lift your hands straight over your head.  Hold for __________ seconds. Slowly ease the tension on the band/tubing as you reverse the directions and return to the starting position. Repeat __________ times. Complete this exercise __________ times per day.   Document Released: 10/02/2005 Document Revised: 12/25/2011 Document Reviewed: 01/14/2009 [facility] Patient Information 2014 [place], [place].

## 2014-03-29 ENCOUNTER — Encounter: Payer: Self-pay | Admitting: Family Medicine

## 2014-03-29 DIAGNOSIS — M899 Disorder of bone, unspecified: Secondary | ICD-10-CM | POA: Insufficient documentation

## 2014-03-29 DIAGNOSIS — M999 Biomechanical lesion, unspecified: Secondary | ICD-10-CM | POA: Insufficient documentation

## 2014-03-29 NOTE — Progress Notes (Signed)
[name] [address] [place], [place] [address] Phone: [phone] Subjective:    I'm seeing this patient by the request  of:  Eulas Post, MD   CC: Thoracic back pain  [id number] Eric Hood is a 33 y.o. male coming in with complaint of back pain. Patient has had this pain for multiple years. Patient is seen primary care provider for this on multiple occasions. Patient given did have imaging done back in April of 2014 which was reviewed by me. Patient's x-rays of the thoracic spine show no abnormality. Patient states though that he has this pain almost on a daily basis and seems to be getting worse. Patient states that most of the pain seems to be between the shoulder blades. Patient has had this pain he states for approximately 10 years. Denies any radiation into his arms or any numbness in his legs. States that it seems to localize. Patient does when he is going to have a muscle spasm because it becomes numb first. Patient states that the pain occurs. Does respond moderately to anti-inflammatories. Patient has not tried any other modalities other than over-the-counter medications. Denies any nighttime awakening but can find it difficult to fall asleep at night. Patient has gone to a couple visits of formal physical therapy with minimal improvement. Patient does state that muscle relaxers can be helpful. Is to be careful with anti-inflammatories to reflux disease. Patient that the severity of 7/10.     Past medical history, social, surgical and family history all reviewed in electronic medical record.   Review of Systems: No headache, visual changes, nausea, vomiting, diarrhea, constipation, dizziness, abdominal pain, skin rash, fevers, chills, night sweats, weight loss, swollen lymph nodes, body aches, joint swelling, muscle aches, chest pain, shortness of breath, mood changes.   Objective Blood pressure 134/82, pulse 99, height 6' (1.829 m),  weight 182 lb (82.555 kg), SpO2 99.00%.  General: No apparent distress alert and oriented x3 mood and affect normal, dressed appropriately.  HEENT: Pupils equal, extraocular movements intact  Respiratory: Patient's speak in full sentences and does not appear short of breath  Cardiovascular: No lower extremity edema, non tender, no erythema  Skin: Warm dry intact with no signs of infection or rash on extremities or on axial skeleton.  Abdomen: Soft nontender  Neuro: Cranial nerves II through XII are intact, neurovascularly intact in all extremities with 2+ DTRs and 2+ pulses.  Lymph: No lymphadenopathy of posterior or anterior cervical chain or axillae bilaterally.  Gait normal with good balance and coordination.  MSK:  Non tender with full range of motion and good stability and symmetric strength and tone of shoulders, elbows, wrist, hip, knee and ankles bilaterally.  Back Exam:  Inspection: Mild increase in kyphosis of the upper thoracic spine Motion: Flexion 35 deg, Extension 35 deg, Side Bending to 45 deg bilaterally,  Rotation to 45 deg bilaterally  SLR laying: Negative  XSLR laying: Negative  Palpable tenderness: None. FABER: negative. Sensory change: Gross sensation intact to all lumbar and sacral dermatomes.  Reflexes: 2+ at both patellar tendons, 2+ at achilles tendons, Babinski's downgoing.  Strength at foot  Plantar-flexion: 5/5 Dorsi-flexion: 5/5 Eversion: 5/5 Inversion: 5/5  Leg strength  Quad: 5/5 Hamstring: 5/5 Hip flexor: 5/5 Hip abductors:4/5  Gait unremarkable. Patient does have some scapular dyskinesia with incomplete moving of the inferior spine of the scapula bilaterally right greater than left. No winging noted. Weakness of the shoulder girdle is appreciated.  OMT Physical  Exam   Standing flexion right  Seated Flexion right  Cervical  C5 flexed rotated and side bent right  Thoracic Lanny Hurst to extended rotated and side bent right with  elevated 2nd rib T7  extended rotated and side bent left  Lumbar L2 flexed rotated and side bent right  Sacrum Left on left  Illium        Impression and Recommendations:     This case required medical decision making of moderate complexity.

## 2014-03-29 NOTE — Assessment & Plan Note (Signed)
Decision today to treat with OMT was based on Physical Exam  After verbal consent patient was treated with HVLA, ME, FPR techniques in Cervical, thoracic, rib, lumbar and sacral areas  Patient tolerated the procedure well with improvement in symptoms  Patient given exercises, stretches and lifestyle modifications  See medications in patient instructions if given  Patient will follow up in 3 weeks

## 2014-03-29 NOTE — Assessment & Plan Note (Signed)
Patient's thoracic back pain is multifactorial been secondary to muscle imbalances, scapular dyskinesia, and poor working positioning and posture. Patient did respond well to osteopathic manipulation was given home exercises especially for the scapular dyskinesia. We discussed over-the-counter medications that could be beneficial as well. We discussed working position this could be beneficial as well as lifting properly. Patient will come back again in 3 weeks for further evaluation and treatment.

## 2014-03-29 NOTE — Assessment & Plan Note (Signed)
Patient scapula does not have proper alignment of movement. We are going to try manipulation, home exercises, and decrease any inflammation patient might have subacutely with over-the-counter medications to avoid upsetting patient's reflux disease. Patient will come back in 3 weeks for further evaluation.

## 2014-04-03 ENCOUNTER — Encounter: Payer: Self-pay | Admitting: Gastroenterology

## 2014-04-03 ENCOUNTER — Telehealth: Payer: Self-pay | Admitting: Family Medicine

## 2014-04-03 NOTE — Telephone Encounter (Signed)
Pt states his original referral was done incorrectly with the GI doctor, however he called them and they fixed the information and made him and appt , pt has appt on 8/26, pt states he can not wait that long because he is still in a lot of pain, and is still unable to eat , pt wants to know if dr. Elease Hashimoto can call them and get an urgent appt.

## 2014-04-06 NOTE — Telephone Encounter (Signed)
Called GI and got patient appt 04/08/14 at 9:30am pt is aware.

## 2014-04-08 ENCOUNTER — Ambulatory Visit (INDEPENDENT_AMBULATORY_CARE_PROVIDER_SITE_OTHER): Payer: 59 | Admitting: Gastroenterology

## 2014-04-08 ENCOUNTER — Encounter: Payer: Self-pay | Admitting: Gastroenterology

## 2014-04-08 VITALS — BP 90/60 | HR 96 | Ht 72.0 in | Wt 182.0 lb

## 2014-04-08 DIAGNOSIS — R1319 Other dysphagia: Secondary | ICD-10-CM

## 2014-04-08 DIAGNOSIS — K219 Gastro-esophageal reflux disease without esophagitis: Secondary | ICD-10-CM

## 2014-04-08 NOTE — Patient Instructions (Signed)
You have been scheduled for an endoscopy. Please follow written instructions given to you at your visit today. If you use inhalers (even only as needed), please bring them with you on the day of your procedure. Your physician has requested that you go to www.startemmi.com and enter the access code given to you at your visit today. This web site gives a general overview about your procedure. However, you should still follow specific instructions given to you by our office regarding your preparation for the procedure.  Thank you for choosing me and [facility].  Malcolm T. Stark, Jr., MD., FACG  

## 2014-04-08 NOTE — Progress Notes (Signed)
    History of Present Illness: This is a 33 -year-old male who relates a 3-4 month history of frequent solid food dysphagia. He has occasional reflux and regurgitation symptoms. He has had to induce vomiting on several occasions. Was placed on pantoprazole by Dr. Elease Hashimoto and referred for further evaluation. The risks, benefits, and alternatives to endoscopy with possible biopsy and possible dilation were discussed with the patient and they consent to proceed.   Review of Systems: Pertinent positive and negative review of systems were noted in the above HPI section. All other review of systems were otherwise negative.  Current Medications, Allergies, Past Medical History, Past Surgical History, Family History and Social History were reviewed in [employer] record.  Physical Exam: General: Well developed , well nourished, no acute distress Head: Normocephalic and atraumatic Eyes:  sclerae anicteric, EOMI Ears: Normal auditory acuity Mouth: No deformity or lesions Neck: Supple, no masses or thyromegaly Lungs: Clear throughout to auscultation Heart: Regular rate and rhythm; no murmurs, rubs or bruits Abdomen: Soft, non tender and non distended. No masses, hepatosplenomegaly or hernias noted. Normal Bowel sounds Musculoskeletal: Symmetrical with no gross deformities  Skin: No lesions on visible extremities Pulses:  Normal pulses noted Extremities: No clubbing, cyanosis, edema or deformities noted Neurological: Alert oriented x 4, grossly nonfocal Cervical Nodes:  No significant cervical adenopathy Inguinal Nodes: No significant inguinal adenopathy Psychological:  Alert and cooperative. Normal mood and affect  Assessment and Recommendations:  1. Dysphagia and GERD. Rule out esophageal stricture. Continue pantoprazole 40 mg daily and antireflux measures. The risks, benefits, and alternatives to endoscopy with possible biopsy and possible dilation were discussed with  the patient and they consent to proceed.

## 2014-04-10 ENCOUNTER — Ambulatory Visit (AMBULATORY_SURGERY_CENTER): Payer: 59 | Admitting: Gastroenterology

## 2014-04-10 ENCOUNTER — Encounter: Payer: Self-pay | Admitting: Gastroenterology

## 2014-04-10 VITALS — BP 128/66 | HR 77 | Temp 98.4°F | Resp 13 | Ht 72.0 in | Wt 182.0 lb

## 2014-04-10 DIAGNOSIS — R1319 Other dysphagia: Secondary | ICD-10-CM

## 2014-04-10 DIAGNOSIS — K2100 Gastro-esophageal reflux disease with esophagitis, without bleeding: Secondary | ICD-10-CM

## 2014-04-10 DIAGNOSIS — K222 Esophageal obstruction: Secondary | ICD-10-CM

## 2014-04-10 DIAGNOSIS — K2210 Ulcer of esophagus without bleeding: Secondary | ICD-10-CM

## 2014-04-10 DIAGNOSIS — K219 Gastro-esophageal reflux disease without esophagitis: Secondary | ICD-10-CM

## 2014-04-10 MED ORDER — PANTOPRAZOLE SODIUM 40 MG PO TBEC: 40.0000 mg | DELAYED_RELEASE_TABLET | Freq: Two times a day (BID) | ORAL | Status: DC

## 2014-04-10 MED ORDER — SODIUM CHLORIDE 0.9 % IV SOLN: 500.0000 mL | INTRAVENOUS | Status: DC

## 2014-04-10 NOTE — Progress Notes (Signed)
Called to room to assist during endoscopic procedure.  Patient ID and intended procedure confirmed with present staff. Received instructions for my participation in the procedure from the performing physician.  

## 2014-04-10 NOTE — Progress Notes (Signed)
A/ox3, pleased with MAC, report to RN 

## 2014-04-10 NOTE — Patient Instructions (Signed)
YOU HAD AN ENDOSCOPIC PROCEDURE TODAY AT [facility]: Refer to the procedure report that was given to you for any specific questions about what was found during the examination.  If the procedure report does not answer your questions, please call your gastroenterologist to clarify.  If you requested that your care partner not be given the details of your procedure findings, then the procedure report has been included in a sealed envelope for you to review at your convenience later.  YOU SHOULD EXPECT: Some feelings of bloating in the abdomen. Passage of more gas than usual.  Walking can help get rid of the air that was put into your GI tract during the procedure and reduce the bloating. If you had a lower endoscopy (such as a colonoscopy or flexible sigmoidoscopy) you may notice spotting of blood in your stool or on the toilet paper. If you underwent a bowel prep for your procedure, then you may not have a normal bowel movement for a few days.  DIET: You may stay on clear liquids until 5:15pm, then you can proceed to the soft diet for today.  Tomorrow you may proceed to a regular diet. Drink plenty of fluids but you should avoid alcoholic beverages for 24 hours.  ACTIVITY: Your care partner should take you home directly after the procedure.  You should plan to take it easy, moving slowly for the rest of the day.  You can resume normal activity the day after the procedure however you should NOT DRIVE or use heavy machinery for 24 hours (because of the sedation medicines used during the test).    SYMPTOMS TO REPORT IMMEDIATELY: A gastroenterologist can be reached at any hour.  During normal business hours, 8:30 AM to 5:00 PM Monday through Friday, call [phone].  After hours and on weekends, please call the GI answering service at [phone] who will take a message and have the physician on call contact you.   Following upper endoscopy (EGD)  Vomiting of blood or coffee ground  material  New chest pain or pain under the shoulder blades  Painful or persistently difficult swallowing  New shortness of breath  Fever of 100F or higher  Black, tarry-looking stools  FOLLOW UP: If any biopsies were taken you will be contacted by phone or by letter within the next 1-3 weeks.  Call your gastroenterologist if you have not heard about the biopsies in 3 weeks.  Our staff will call the home number listed on your records the next business day following your procedure to check on you and address any questions or concerns that you may have at that time regarding the information given to you following your procedure. This is a courtesy call and so if there is no answer at the home number and we have not heard from you through the emergency physician on call, we will assume that you have returned to your regular daily activities without incident.  SIGNATURES/CONFIDENTIALITY: You and/or your care partner have signed paperwork which will be entered into your electronic medical record.  These signatures attest to the fact that that the information above on your After Visit Summary has been reviewed and is understood.  Full responsibility of the confidentiality of this discharge information lies with you and/or your care-partner.  Be sure to take your Pantoprazole twice a day.  Avoid spicy food.  Try to eat soft food until your esophagitis gets a little better.  Dr. Fuller Plan wants to see you in his  office in 3-4 weeks.  The 3rd floor staff will call you about that appointment, and you will need another dilation eventually.

## 2014-04-10 NOTE — Op Note (Signed)
[place]  [address] [place], [id number]   ENDOSCOPY PROCEDURE REPORT  PATIENT: Eric Hood, Eric Hood  MR#: [id number] BIRTHDATE: Sep 28, 1981 , 32  yrs. old GENDER: Male ENDOSCOPIST: Ladene Artist, MD, [facility] REFERRED BY:  Carolann Littler, M.D. PROCEDURE DATE:  04/10/2014 PROCEDURE:  EGD w/ biopsy and balloon dilation of the esophagus ASA CLASS:     Class II INDICATIONS:  Dysphagia.   History of esophageal reflux. MEDICATIONS: MAC sedation, administered by CRNA and propofol (Diprivan) 440mg  IV TOPICAL ANESTHETIC: none DESCRIPTION OF PROCEDURE: After the risks benefits and alternatives of the procedure were thoroughly explained, informed consent was obtained.  The LB [id number] endoscope was introduced through the mouth and advanced to the second portion of the duodenum. Without limitations.  The instrument was slowly withdrawn as the mucosa was fully examined.  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 The stenosis was non traversable with the endoscope.  Multiple biopsies were performed.  TTS balloon dilation to 12 mm performed. Further dilation not performed due to significant inflammation. The endoscope was then easily passed to the stomach. The mucosa of the esophagus appeared normal in the upper esophagus. Abnormal mucosa was found in the lower third of the esophagus from 28 to 39 cm. The mucosa was erythematous. R/O Barrett's. Multiple biopsies were performed. STOMACH: The mucosa and folds of the stomach appeared normal. DUODENUM: The duodenal mucosa showed no abnormalities in the bulb and second portion of the duodenum.  Retroflexed views revealed a 5 cm hiatal hernia.  The scope was then withdrawn from the patient and the procedure completed.  COMPLICATIONS: There were no complications.  ENDOSCOPIC IMPRESSION: 1.   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    Abnormal mucosa in the lower third of the esophagus; multiple biopsies 3.    LA Class D esophagitis 4.    4 cm hiatal hernia  RECOMMENDATIONS: 1.  Anti-reflux regimen long term 2.  Await pathology results 3.  post dilation instructions and a soft diet 4.  OP follow-up in 3-4 weeks with me or one of our APPs. Likely will need repeat dilation as the inflammation improves. 5.  PPI bid: pantorpazole 40 mg po bid  eSigned:  Ladene Artist, MD, [facility] 04/10/2014 4:07 PM

## 2014-04-13 ENCOUNTER — Telehealth: Payer: Self-pay | Admitting: *Deleted

## 2014-04-13 NOTE — Telephone Encounter (Signed)
  Follow up Call-  Call back number 04/10/2014  Post procedure Call Back phone  # [phone]  Permission to leave phone message Yes     Patient questions:  Do you have a fever, pain , or abdominal swelling? No. Pain Score  0 *  Have you tolerated food without any problems? Yes.    Have you been able to return to your normal activities? Yes.    Do you have any questions about your discharge instructions: Diet   No. Medications  No. Follow up visit  No.  Do you have questions or concerns about your Care? No.  Actions: * If pain score is 4 or above: No action needed, pain <4.

## 2014-04-14 ENCOUNTER — Telehealth: Payer: Self-pay | Admitting: Family Medicine

## 2014-04-14 MED ORDER — PANTOPRAZOLE SODIUM 40 MG PO TBEC: 40.0000 mg | DELAYED_RELEASE_TABLET | Freq: Two times a day (BID) | ORAL | Status: DC

## 2014-04-14 NOTE — Telephone Encounter (Signed)
Rx sent to pharmacy   

## 2014-04-14 NOTE — Telephone Encounter (Signed)
Pt states that dr. Elease Hashimoto provided him an rx pantoprazole (PROTONIX) 40 MG tablet, once daily, however dr.stark informed him to take it twice daily, pt states he has 1 more refill for 30 days, pt just picked one rx up today, pt would like to know if dr. Elease Hashimoto will write the rx for twice daily.

## 2014-04-19 ENCOUNTER — Encounter: Payer: Self-pay | Admitting: Gastroenterology

## 2014-04-20 ENCOUNTER — Encounter: Payer: Self-pay | Admitting: Family Medicine

## 2014-04-20 ENCOUNTER — Ambulatory Visit (INDEPENDENT_AMBULATORY_CARE_PROVIDER_SITE_OTHER): Payer: 59 | Admitting: Family Medicine

## 2014-04-20 VITALS — BP 132/74 | HR 71 | Ht 72.0 in | Wt 179.0 lb

## 2014-04-20 DIAGNOSIS — M9981 Other biomechanical lesions of cervical region: Secondary | ICD-10-CM

## 2014-04-20 DIAGNOSIS — M546 Pain in thoracic spine: Secondary | ICD-10-CM

## 2014-04-20 DIAGNOSIS — M999 Biomechanical lesion, unspecified: Secondary | ICD-10-CM

## 2014-04-20 NOTE — Patient Instructions (Signed)
Good to see you.  Hold on vitamins until we get your stomach straighten.  Physical therapy will be calling you.  Whey Protein Isolate.  Take with milk or water between breakfast and lunch as well as lunch and dinner.  Come back again in 3 weeks. Lets hope the numbness stays away.

## 2014-04-20 NOTE — Progress Notes (Signed)
  [name] [address] [address], [place] [address] Phone: [phone] Subjective:     CC: Thoracic back pain, follow up  EPP:[id number] Eric Hood is a 33 y.o. male coming in with complaint of back pain. Patient was seen previously and had more of a muscle imbalance after a fairly significant workup. Patient states that he has not been able to do the exercises as regularly and has not been able to take any medication secondary to gastrointestinal intervention recently. Patient did have osteopathic manipulation and states that he did have multiple days where the pain seemed to be improved but now he is back to his regular baseline. Patient does have a past medical history significant for esophageal strictures. Denies any association with this as well as the hiatal hernia. Patient denies any new symptoms. Denies any nighttime awakening.     Past medical history, social, surgical and family history all reviewed in electronic medical record.   Review of Systems: No headache, visual changes, nausea, vomiting, diarrhea, constipation, dizziness, abdominal pain, skin rash, fevers, chills, night sweats, weight loss, swollen lymph nodes, body aches, joint swelling, muscle aches, chest pain, shortness of breath, mood changes.   Objective Blood pressure 132/74, pulse 71, height 6' (1.829 m), weight 179 lb (81.194 kg), SpO2 99.00%.  General: No apparent distress alert and oriented x3 mood and affect normal, dressed appropriately.  HEENT: Pupils equal, extraocular movements intact  Respiratory: Patient's speak in full sentences and does not appear short of breath  Cardiovascular: No lower extremity edema, non tender, no erythema  Skin: Warm dry intact with no signs of infection or rash on extremities or on axial skeleton.  Abdomen: Soft nontender  Neuro: Cranial nerves II through XII are intact, neurovascularly intact in all extremities with 2+ DTRs and 2+ pulses.    Lymph: No lymphadenopathy of posterior or anterior cervical chain or axillae bilaterally.  Gait normal with good balance and coordination.  MSK:  Non tender with full range of motion and good stability and symmetric strength and tone of shoulders, elbows, wrist, hip, knee and ankles bilaterally.  Back Exam:  Inspection: Mild increase in kyphosis of the upper thoracic spine Motion: Flexion 35 deg, Extension 35 deg, Side Bending to 45 deg bilaterally,  Rotation to 45 deg bilaterally  SLR laying: Negative  XSLR laying: Negative  Palpable tenderness: None. FABER: negative. Sensory change: Gross sensation intact to all lumbar and sacral dermatomes.  Reflexes: 2+ at both patellar tendons, 2+ at achilles tendons, Babinski's downgoing.  Strength at foot  Plantar-flexion: 5/5 Dorsi-flexion: 5/5 Eversion: 5/5 Inversion: 5/5  Leg strength  Quad: 5/5 Hamstring: 5/5 Hip flexor: 5/5 Hip abductors:4/5  Gait unremarkable. Patient does have some scapular dyskinesia with incomplete moving of the inferior spine of the scapula bilaterally right greater than left. No winging noted. Patient did have some mild increase in strength of the shoulder girdle compared to previous exam.  OMT Physical Exam  Cervical  C5 flexed rotated and side bent right  Thoracic T2  to extended rotated and side bent right with  elevated 2nd rib T5 extended rotated inside that right T7 extended rotated and side bent left  Lumbar L2 flexed rotated and side bent right  Sacrum Left on left  Illium Neutral    Impression and Recommendations:     This case required medical decision making of moderate complexity.

## 2014-04-20 NOTE — Assessment & Plan Note (Signed)
From patient's previous workup I still believe that this is likely secondary to muscle imbalances encourage patient to do the exercises more of a regular basis. Patient was showed proper form on a lot of different exercises for the scapular dyskinesia. We discussed again formal physical therapy which patient stated he would like to do. Patient is going to go to physical therapy for scapular dysfunction and thoracic back pain. In addition this patient will try medications and start the over-the-counter medications when she gets approval from his gastrointestinal doctor. Patient continued home exercises and will follow up with me again in 2-3 weeks for further evaluation and treatment.  Spent greater than 25 minutes with patient face-to-face and had greater than 50% of counseling including as described above in assessment and plan.

## 2014-04-20 NOTE — Assessment & Plan Note (Signed)
Decision today to treat with OMT was based on Physical Exam  After verbal consent patient was treated with HVLA, ME, FPR techniques in Cervical, thoracic, rib, lumbar and sacral areas  Patient tolerated the procedure well with improvement in symptoms  Patient given exercises, stretches and lifestyle modifications  See medications in patient instructions if given  Patient will follow up in 3 weeks

## 2014-04-29 ENCOUNTER — Encounter: Payer: Self-pay | Admitting: Gastroenterology

## 2014-04-29 ENCOUNTER — Ambulatory Visit (INDEPENDENT_AMBULATORY_CARE_PROVIDER_SITE_OTHER): Payer: 59 | Admitting: Gastroenterology

## 2014-04-29 VITALS — BP 104/50 | HR 102 | Ht 72.0 in | Wt 183.0 lb

## 2014-04-29 DIAGNOSIS — K222 Esophageal obstruction: Secondary | ICD-10-CM

## 2014-04-29 DIAGNOSIS — K2100 Gastro-esophageal reflux disease with esophagitis, without bleeding: Secondary | ICD-10-CM

## 2014-04-29 NOTE — Patient Instructions (Signed)
You have been scheduled for an endoscopy. Please follow written instructions given to you at your visit today. If you use inhalers (even only as needed), please bring them with you on the day of your procedure. Your physician has requested that you go to www.startemmi.com and enter the access code given to you at your visit today. This web site gives a general overview about your procedure. However, you should still follow specific instructions given to you by our office regarding your preparation for the procedure.  Thank you for choosing me and [facility].  Malcolm T. Stark, Jr., MD., FACG  

## 2014-04-29 NOTE — Progress Notes (Signed)
    History of Present Illness: This is a 33 year old male returning for followup after endoscopy with esophageal dilation. LA Class D erosive esophagitis was noted along with an esophageal stricture. He underwent balloon dilation to 12 mm. His reflux symptoms have abated. His dysphagia has substantially improved although he notes mild persistent problems with solids.  Current Medications, Allergies, Past Medical History, Past Surgical History, Family History and Social History were reviewed in [employer] record.  Physical Exam: General: Well developed , well nourished, no acute distress Head: Normocephalic and atraumatic Eyes:  sclerae anicteric, EOMI Ears: Normal auditory acuity Mouth: No deformity or lesions Lungs: Clear throughout to auscultation Heart: Regular rate and rhythm; no murmurs, rubs or bruits Abdomen: Soft, non tender and non distended. No masses, hepatosplenomegaly or hernias noted. Normal Bowel sounds Musculoskeletal: Symmetrical with no gross deformities  Pulses:  Normal pulses noted Extremities: No clubbing, cyanosis, edema or deformities noted Neurological: Alert oriented x 4, grossly nonfocal Psychological:  Alert and cooperative. Normal mood and affect  Assessment and Recommendations:  1. GERD with LA Class D erosive esophagitis, esophageal stricture. Marked improvement in reflux symptoms and dysphagia however dysphagia persists. Continue pantoprazole 40 mg twice daily. Schedule endoscopy with repeat dilation. The risks, benefits, and alternatives to endoscopy with possible biopsy and possible dilation were discussed with the patient and they consent to proceed.

## 2014-05-06 ENCOUNTER — Encounter: Payer: Self-pay | Admitting: Gastroenterology

## 2014-05-11 ENCOUNTER — Encounter: Payer: Self-pay | Admitting: Family Medicine

## 2014-05-11 ENCOUNTER — Ambulatory Visit (INDEPENDENT_AMBULATORY_CARE_PROVIDER_SITE_OTHER): Payer: 59 | Admitting: Family Medicine

## 2014-05-11 VITALS — BP 132/68 | HR 103 | Ht 72.0 in | Wt 183.0 lb

## 2014-05-11 DIAGNOSIS — M949 Disorder of cartilage, unspecified: Secondary | ICD-10-CM

## 2014-05-11 DIAGNOSIS — M999 Biomechanical lesion, unspecified: Secondary | ICD-10-CM

## 2014-05-11 DIAGNOSIS — M899 Disorder of bone, unspecified: Secondary | ICD-10-CM

## 2014-05-11 DIAGNOSIS — M9981 Other biomechanical lesions of cervical region: Secondary | ICD-10-CM

## 2014-05-11 MED ORDER — HYDROXYZINE HCL 25 MG PO TABS: 25.0000 mg | ORAL_TABLET | Freq: Three times a day (TID) | ORAL | Status: DC | PRN

## 2014-05-11 NOTE — Assessment & Plan Note (Signed)
Decision today to treat with OMT was based on Physical Exam  After verbal consent patient was treated with HVLA, ME, FPR techniques in  Cervical, thoracic, lumbar and sacral areas  Patient tolerated the procedure well with improvement in symptoms  Patient given exercises, stretches and lifestyle modifications  See medications in patient instructions if given  Patient will follow up in 3-4 weeks.               

## 2014-05-11 NOTE — Assessment & Plan Note (Signed)
Patient continues to do relatively well. Patient was given more core strengthening exercises to try to avoid the thoracolumbar tightness that was appreciated today. Patient will continue with the other exercises as well as medications. Patient will come back and see me again in 3-4 weeks for further evaluation

## 2014-05-11 NOTE — Patient Instructions (Signed)
Good to see you You are doing great.  Continue the exercises Exercises on wall.  Heel and butt touching.  Raise leg 6 inches and hold 2 seconds.  Down slow for count of 4 seconds.  1 set of 30 reps daily on both sides.  Try the hydroxyzine if you feel nervous, but may make you sleepy so do not drive if taking.  Come back in 4-6 weeks.

## 2014-05-11 NOTE — Progress Notes (Signed)
  [name] [place], [place] [address] Phone: [phone] Subjective:     CC: Thoracic back pain, follow up  WVP:[id number] Eric Hood is a 33 y.o. male coming in with complaint of back pain. Patient was seen previously and had more of a muscle imbalance after a fairly significant workup. Patient states that he continues to improve slowly. Patient has been doing home exercises and doing much better. Patient is also doing the over-the-counter medications. Patient states since last visit he was doing relatively well until the last couple days. No new symptoms though.      Past medical history, social, surgical and family history all reviewed in electronic medical record.   Review of Systems: No headache, visual changes, nausea, vomiting, diarrhea, constipation, dizziness, abdominal pain, skin rash, fevers, chills, night sweats, weight loss, swollen lymph nodes, body aches, joint swelling, muscle aches, chest pain, shortness of breath, mood changes.   Objective Blood pressure 132/68, pulse 103, height 6' (1.829 m), weight 183 lb (83.008 kg), SpO2 97.00%.  General: No apparent distress alert and oriented x3 mood and affect normal, dressed appropriately.  HEENT: Pupils equal, extraocular movements intact  Respiratory: Patient's speak in full sentences and does not appear short of breath  Cardiovascular: No lower extremity edema, non tender, no erythema  Skin: Warm dry intact with no signs of infection or rash on extremities or on axial skeleton.  Abdomen: Soft nontender  Neuro: Cranial nerves II through XII are intact, neurovascularly intact in all extremities with 2+ DTRs and 2+ pulses.  Lymph: No lymphadenopathy of posterior or anterior cervical chain or axillae bilaterally.  Gait normal with good balance and coordination.  MSK:  Non tender with full range of motion and good stability and symmetric strength and tone of shoulders, elbows,  wrist, hip, knee and ankles bilaterally.  Back Exam:  Inspection: Mild increase in kyphosis of the upper thoracic spine Motion: Flexion 35 deg, Extension 35 deg, Side Bending to 45 deg bilaterally,  Rotation to 45 deg bilaterally  SLR laying: Negative  XSLR laying: Negative  Palpable tenderness: None. FABER: negative. Sensory change: Gross sensation intact to all lumbar and sacral dermatomes.  Reflexes: 2+ at both patellar tendons, 2+ at achilles tendons, Babinski's downgoing.  Strength at foot  Plantar-flexion: 5/5 Dorsi-flexion: 5/5 Eversion: 5/5 Inversion: 5/5  Leg strength  Quad: 5/5 Hamstring: 5/5 Hip flexor: 5/5 Hip abductors:4/5  Gait unremarkable. Patient does have some scapular dyskinesia with incomplete moving of the inferior spine of the scapula bilaterally right greater than left. No winging noted. Patient did have some mild increase in strength of the shoulder girdle compared to previous exam.  OMT Physical Exam  Cervical  C5 flexed rotated and side bent right  Thoracic T5 extended rotated inside that right T7 extended rotated and side bent left  Lumbar L2 flexed rotated and side bent right  Sacrum Left on left  Illium Neutral    Impression and Recommendations:     This case required medical decision making of moderate complexity.

## 2014-05-13 ENCOUNTER — Telehealth: Payer: Self-pay | Admitting: Gastroenterology

## 2014-05-13 ENCOUNTER — Encounter: Payer: 59 | Admitting: Gastroenterology

## 2014-05-13 NOTE — Telephone Encounter (Addendum)
Patient called and stated that he was "coughing up thick, yellow mucous." Doesn't have a thermometer, but stated that he "felt like" he had "a fever."   Stated that he would like to re-schedule when he feels better.  Will see his PCP if symptoms get worse.  No SOB.  Just congestion and cough at this time.  He is  A 3:30pm EGD for Dilation today.  Dr. Fuller Plan states that the symptoms did not warrant the patient not to have the procedure and to charge late fees if he doesn't have the procedure.  I left a message at the patient's home number to call me immediately.   There was no answer except the voice mail machine.

## 2014-05-13 NOTE — Telephone Encounter (Signed)
Patient never called back.  He will need to be charged a fee per Dr. Fuller Plan if Now Show.

## 2014-05-13 NOTE — Telephone Encounter (Signed)
Symptoms do not necessarily warrant cancelling. I feel it would be safe to proceed. Please charge late cancellation fee if he decides not to come.

## 2014-05-14 ENCOUNTER — Ambulatory Visit (INDEPENDENT_AMBULATORY_CARE_PROVIDER_SITE_OTHER): Payer: 59 | Admitting: Physician Assistant

## 2014-05-14 ENCOUNTER — Encounter: Payer: Self-pay | Admitting: Physician Assistant

## 2014-05-14 VITALS — BP 140/82 | HR 72 | Temp 98.2°F | Resp 18

## 2014-05-14 DIAGNOSIS — IMO0002 Reserved for concepts with insufficient information to code with codable children: Secondary | ICD-10-CM

## 2014-05-14 DIAGNOSIS — S60419A Abrasion of unspecified finger, initial encounter: Secondary | ICD-10-CM

## 2014-05-14 MED ORDER — MELOXICAM 7.5 MG PO TABS: 7.5000 mg | ORAL_TABLET | Freq: Every day | ORAL | Status: DC

## 2014-05-14 NOTE — Patient Instructions (Addendum)
Continue to monitor your injury for any signs of worsening.  Mobic 1 pill daily for pain and inflammation control. DO NOT TAKE ADVIL, IBUPROFEN, MOTRIN, ALEVE, OR NAPROXEN AT THE SAME TIME AS THIS MEDICATION AS DOING SO INCREASES YOUR CHANCES OF HAVING ADVERSE HEART OR STOMACH EFFECTS TO MEDICATION.  Keep the wound clean and dry.  Topical Bacitracin ointment daily to twice daily. Keep the area covered with a bandage. A large plain bandaid is fine, gauze and tape also works.   If emergency symptoms discussed during visit developed, seek medical attention immediately.  Followup in 3 to 4 days for a recheck, or for worsening or persistent symptoms despite treatment.     Wound Care Wound care helps prevent pain and infection.  You may need a tetanus shot if:  You cannot remember when you had your last tetanus shot.  You have never had a tetanus shot.  The injury broke your skin. If you need a tetanus shot and you choose not to have one, you may get tetanus. Sickness from tetanus can be serious. HOME CARE   Only take medicine as told by your doctor.  Clean the wound daily with mild soap and water.  Change any bandages (dressings) as told by your doctor.  Put medicated cream and a bandage on the wound as told by your doctor.  Change the bandage if it gets wet, dirty, or starts to smell.  Take showers. Do not take baths, swim, or do anything that puts your wound under water.  Rest and raise (elevate) the wound until the pain and puffiness (swelling) are better.  Keep all doctor visits as told. GET HELP RIGHT AWAY IF:   Yellowish-white fluid (pus) comes from the wound.  Medicine does not lessen your pain.  There is a red streak going away from the wound.  You have a fever. MAKE SURE YOU:   Understand these instructions.  Will watch your condition.  Will get help right away if you are not doing well or get worse. Document Released: 07/11/2008 Document Revised:  12/25/2011 Document Reviewed: 02/05/2011 [facility] Patient Information 2015 [place], [place]. This information is not intended to replace advice given to you by your health care provider. Make sure you discuss any questions you have with your health care provider.

## 2014-05-14 NOTE — Progress Notes (Signed)
Subjective:    Patient ID: Eric Hood, male    DOB: [birth year], 33 y.o.   MRN: [id number]  Hand Injury  The incident occurred less than 1 hour ago. The incident occurred at work. Injury mechanism: caught finger in belt sander. Pain location: Right 3rd fingertip and nail. The pain does not radiate. The pain is at a severity of 10/10. The pain is severe. The pain has been constant since the incident. Pertinent negatives include no chest pain, muscle weakness, numbness or tingling. Treatments tried: pressure to stop bleeding. The treatment provided significant (No longer bleeding, blood loss was minimal per patient.) relief.      Review of Systems  Constitutional: Negative for fever and chills.  Respiratory: Negative for shortness of breath.   Cardiovascular: Negative for chest pain.  Gastrointestinal: Negative for nausea, vomiting and diarrhea.  Neurological: Positive for light-headedness (from the pain.). Negative for tingling, syncope, numbness and headaches.  All other systems reviewed and are negative.    Past Medical History  Diagnosis Date  . Back pain   . Reflux   . Esophageal stricture   . Hiatal hernia   . Ulcerative esophagitis     History   Social History  . Marital Status: Single    Spouse Name: N/A    Number of Children: N/A  . Years of Education: N/A   Occupational History  . Not on file.   Social History Main Topics  . Smoking status: Never Smoker   . Smokeless tobacco: Never Used  . Alcohol Use: No  . Drug Use: No     Comment: opiate abuse  . Sexual Activity: No   Other Topics Concern  . Not on file   Social History Narrative  . No narrative on file    Past Surgical History  Procedure Laterality Date  . Hand surgery Right     Family History  Problem Relation Age of Onset  . Hypertension Mother   . Hypertension Father     No Known Allergies  Current Outpatient Prescriptions on File Prior to Visit  Medication Sig Dispense Refill   . hydrOXYzine (ATARAX/VISTARIL) 25 MG tablet Take 1 tablet (25 mg total) by mouth 3 (three) times daily as needed.  30 tablet  0  . pantoprazole (PROTONIX) 40 MG tablet Take 1 tablet (40 mg total) by mouth 2 (two) times daily.  60 tablet  3   No current facility-administered medications on file prior to visit.    EXAM: BP 140/82  Pulse 72  Temp(Src) 98.2 F (36.8 C) (Oral)  Resp 18     Objective:   Physical Exam  Nursing note and vitals reviewed. Constitutional: He is oriented to person, place, and time. He appears well-developed and well-nourished. No distress.  HENT:  Head: Normocephalic and atraumatic.  Eyes: Conjunctivae and EOM are normal. Pupils are equal, round, and reactive to light.  Cardiovascular: Normal rate, regular rhythm and intact distal pulses.   Pulmonary/Chest: Effort normal and breath sounds normal. No respiratory distress.  Musculoskeletal: Normal range of motion. He exhibits tenderness. He exhibits no edema.  Range of motion Limited due to pain.  Right third fingertip superficial abrasion extending to the DIP. There is uniform where of the middle portion of the right third fingernail down to the nailbed. The nailbed is macerated, appears to have bled but is not currently bleeding. There are no open wounds or visible bone or tendon involvement. The area is diffusely tender to palpation.  Neurological: He  is alert and oriented to person, place, and time.  Skin: Skin is warm and dry. No rash noted. He is not diaphoretic. No erythema. There is pallor.  Pallor improved during course of exam, no pallor at end of exam.  Psychiatric: He has a normal mood and affect. His behavior is normal. Judgment and thought content normal.    No results found for this basename: WBC, HGB, HCT, PLT, GLUCOSE, CHOL, TRIG, HDL, LDLDIRECT, LDLCALC, ALT, AST, NA, K, CL, CREATININE, BUN, CO2, TSH, PSA, INR, GLUF, HGBA1C, MICROALBUR         Assessment & Plan:  Etheridge was seen today for  finger injury.  Diagnoses and associated orders for this visit:  Finger abrasion, initial encounter Comments: Recomend Xray, pt refused. Recomend Pain medication, pt refused, will try Rx NSAID instead. Wound care discussed, watchful waiting. - meloxicam (MOBIC) 7.5 MG tablet; Take 1 tablet (7.5 mg total) by mouth daily.    Patient was strongly advised to have an x-ray done of his finger today in order to rule out any potential fracture or bone involvement of the injury. Patient refused the x-ray stating that he did not feel that he needed one.   Patient also refused all pain medication initially, however decided to try prescription strength NSAID during exam.  We will have close followup to make sure that no infection or other complicated develops, especially since patient refused to have an x-ray done.  The patient appeared pale at the beginning of the exam, however his color greatly improved during the course of the exam. He also reported his lightheadedness improved during exam. This is suggestive of a vasovagal type response to the pain.  Return precautions provided, and patient handout on wound care.  Plan to follow up 3 to 4 days to reassess, or for worsening or persistent symptoms despite treatment.  Patient Instructions  Continue to monitor your injury for any signs of worsening.  Mobic 1 pill daily for pain and inflammation control. DO NOT TAKE ADVIL, IBUPROFEN, MOTRIN, ALEVE, OR NAPROXEN AT THE SAME TIME AS THIS MEDICATION AS DOING SO INCREASES YOUR CHANCES OF HAVING ADVERSE HEART OR STOMACH EFFECTS TO MEDICATION.  Keep the wound clean and dry.  Topical Bacitracin ointment daily to twice daily. Keep the area covered with a bandage. A large plain bandaid is fine, gauze and tape also works.   If emergency symptoms discussed during visit developed, seek medical attention immediately.  Followup in 3 to 4 days for a recheck, or for worsening or persistent symptoms despite  treatment.

## 2014-05-14 NOTE — Progress Notes (Signed)
Pre visit review using our clinic review tool, if applicable. No additional management support is needed unless otherwise documented below in the visit note. 

## 2014-05-15 ENCOUNTER — Telehealth: Payer: Self-pay | Admitting: Family Medicine

## 2014-05-15 ENCOUNTER — Ambulatory Visit (INDEPENDENT_AMBULATORY_CARE_PROVIDER_SITE_OTHER): Payer: 59 | Admitting: Family Medicine

## 2014-05-15 ENCOUNTER — Encounter: Payer: Self-pay | Admitting: Family Medicine

## 2014-05-15 VITALS — BP 130/70 | HR 96 | Temp 98.4°F | Resp 18 | Ht 72.0 in | Wt 188.0 lb

## 2014-05-15 DIAGNOSIS — Z5189 Encounter for other specified aftercare: Secondary | ICD-10-CM

## 2014-05-15 DIAGNOSIS — S61209A Unspecified open wound of unspecified finger without damage to nail, initial encounter: Secondary | ICD-10-CM

## 2014-05-15 DIAGNOSIS — S61309D Unspecified open wound of unspecified finger with damage to nail, subsequent encounter: Secondary | ICD-10-CM

## 2014-05-15 MED ORDER — HYDROMORPHONE HCL 2 MG PO TABS: 2.0000 mg | ORAL_TABLET | ORAL | Status: DC | PRN

## 2014-05-15 NOTE — Telephone Encounter (Signed)
Noted  

## 2014-05-15 NOTE — Patient Instructions (Signed)
Continue to clean the finger wound daily with soap and water. Continue daily application of antibiotic ointment and keep covered for the next week Followup promptly for signs of infection such as redness, or warmth, or puslike drainage

## 2014-05-15 NOTE — Progress Notes (Signed)
   Subjective:    Patient ID: Eric Hood, male    DOB: [birth year], 33 y.o.   MRN: [id number]  Dizziness Pertinent negatives include no chills or fever.   Patient seen for followup regarding avulsion of the fingernail right third digit. Also had some abrasion over the DIP joint. He is having significant pain and has some dizziness. He describes throbbing pain in his finger which has not been relieved with Mobic 7.5 mg. He's had previous problems with nausea and vomiting with codeine, hydrocodone, and oxycodone. His tetanus is up-to-date. He is not seeing signs of secondary infection.  Past Medical History  Diagnosis Date  . Back pain   . Reflux   . Esophageal stricture   . Hiatal hernia   . Ulcerative esophagitis    Past Surgical History  Procedure Laterality Date  . Hand surgery Right     reports that he has never smoked. He has never used smokeless tobacco. He reports that he does not drink alcohol or use illicit drugs. family history includes Hypertension in his father and mother. No Known Allergies    Review of Systems  Constitutional: Negative for fever and chills.  Neurological: Positive for dizziness.       Objective:   Physical Exam  Constitutional: He appears well-developed and well-nourished.  Cardiovascular: Normal rate and regular rhythm.   Musculoskeletal:  Right third finger reveals avulsion of most of the fingernail. He also has fairly deep abrasion extending from the DIP joint area toward the nail base. There is no erythema or warmth. No drainage. Full range of motion right third digit with all joints.          Assessment & Plan:  Avulsion injury right third digit. No signs of secondary infection. Patient has pain poorly controlled with nonsteroidal. Wrote limited Dilaudid 2 mg one every 4 hours for severe pain #20 with no refill. He's been intolerant of other opioids as above. Continue good wound care. Followup promptly for signs secondary  infection

## 2014-05-15 NOTE — Telephone Encounter (Signed)
Pt is requesting an adi

## 2014-05-15 NOTE — Telephone Encounter (Signed)
Patient Information:  Caller Name: Tarell  Phone: [phone]  Patient: Eric Hood  Gender: Male  DOB: 10/26/1980  Age: 33 Years  PCP: Carolann Littler (Family Practice)  Office Follow Up:  Does the office need to follow up with this patient?: No  Instructions For The Office: N/A   Symptoms  Reason For Call & Symptoms: Patient reports injury to right middle finger on 7/30.15,.  He was evaluated 05/14/14.  Currently pain rated at "pretty bad; I'm struggling driving because it hurts so bad."  He asks if he could get it injected with numbing medication.  Emergent symptoms ruled out.  See Today in Office per Finger Injury guideline due to Severe pain.  Reviewed Health History In EMR: Yes  Reviewed Medications In EMR: Yes  Reviewed Allergies In EMR: Yes  Reviewed Surgeries / Procedures: Yes  Date of Onset of Symptoms: 05/14/2014  Treatments Tried: Evaluated in office; currently wrapped with bandaid.  Mobic.  Treatments Tried Worked: No  Guideline(s) Used:  Finger Injury  Disposition Per Guideline:   See Today in Office  Reason For Disposition Reached:   Severe pain (e.g., excruciating)  Advice Given:  Apply a Cold Pack:  Apply a cold pack or an ice bag (wrapped in a moist towel) to the area for 20 minutes. Repeat in 1 hour, then every 4 hours while awake.  This will help decrease pain and swelling.  Keep the Hand Elevated:  When you are up walking around, try not to let the hand hang down. If you do you will notice that the finger hurts and throbs more.  This can also help decrease swelling and pain.  Rest vs. Movement:  Movement is generally more healing in the long term than rest.  Continue normal activities as much as your pain permits.  If it really hurts to use the finger at all, you will need to see the doctor.  Call Back If:  Pain becomes severe  Pain does not improve after 3 days  You become worse.  Patient Will Follow Care Advice:  YES  Appointment  Scheduled:  05/15/2014 09:45:00 Appointment Scheduled Provider:  Carolann Littler [facility])

## 2014-05-15 NOTE — Progress Notes (Signed)
Pre visit review using our clinic review tool, if applicable. No additional management support is needed unless otherwise documented below in the visit note. 

## 2014-05-25 ENCOUNTER — Ambulatory Visit: Payer: 59 | Admitting: Family Medicine

## 2014-06-10 ENCOUNTER — Ambulatory Visit: Payer: 59 | Admitting: Gastroenterology

## 2014-06-12 ENCOUNTER — Telehealth: Payer: Self-pay

## 2014-06-12 ENCOUNTER — Encounter: Payer: Self-pay | Admitting: Family Medicine

## 2014-06-12 ENCOUNTER — Ambulatory Visit (INDEPENDENT_AMBULATORY_CARE_PROVIDER_SITE_OTHER): Payer: 59 | Admitting: Family Medicine

## 2014-06-12 ENCOUNTER — Other Ambulatory Visit: Payer: Self-pay | Admitting: Family Medicine

## 2014-06-12 VITALS — BP 128/76 | HR 103 | Wt 183.0 lb

## 2014-06-12 DIAGNOSIS — F411 Generalized anxiety disorder: Secondary | ICD-10-CM

## 2014-06-12 MED ORDER — ESCITALOPRAM OXALATE 10 MG PO TABS: 10.0000 mg | ORAL_TABLET | Freq: Every day | ORAL | Status: DC

## 2014-06-12 MED ORDER — CITALOPRAM HYDROBROMIDE 20 MG PO TABS: 20.0000 mg | ORAL_TABLET | Freq: Every day | ORAL | Status: DC

## 2014-06-12 NOTE — Telephone Encounter (Signed)
Patient Information:  Caller Name: Kervens  Phone: [phone]  Patient: Eric Hood  Gender: Male  DOB: July 26, 1981  Age: 33 Years  PCP: Carolann Littler [facility])  Office Follow Up:  Does the office need to follow up with this patient?: Yes  Instructions For The Office: REquesting medication  RN Note:  Patient is calling regarding anxiety.  Denies thoughts of harm to himself or others.  States he has a history of this and has appointment for visit scheduled for 06/15/14.  Requesting appointment for today or consideration of refill of Alprazolam to be called to [facility]. [phone].  Desires call back today.  Symptoms  Reason For Call & Symptoms: c/o anxiety attacks  Reviewed Health History In EMR: Yes  Reviewed Medications In EMR: Yes  Reviewed Allergies In EMR: Yes  Reviewed Surgeries / Procedures: Yes  Date of Onset of Symptoms: 06/05/2014  Guideline(s) Used:  Depression  No Protocol Available - Information Only  Neurologic Deficit  Disposition Per Guideline:   Discuss with PCP and Callback by Nurse Today  Reason For Disposition Reached:   Nursing judgment  Advice Given:  Call Back If:  You become worse.  Patient Will Follow Care Advice:  YES

## 2014-06-12 NOTE — Telephone Encounter (Signed)
Pt is coming in today to be seen by Dr. Elease Hashimoto.

## 2014-06-12 NOTE — Telephone Encounter (Signed)
RX sent to pharmacy  

## 2014-06-12 NOTE — Patient Instructions (Signed)
Be in touch in 3 weeks for feedback.

## 2014-06-12 NOTE — Telephone Encounter (Signed)
Refill done.  

## 2014-06-12 NOTE — Progress Notes (Signed)
Pre visit review using our clinic review tool, if applicable. No additional management support is needed unless otherwise documented below in the visit note. 

## 2014-06-12 NOTE — Telephone Encounter (Signed)
Pt called and stated that he has already taking Celexa before and it did not help.

## 2014-06-12 NOTE — Telephone Encounter (Signed)
Let's try Lexapro 10 mg once daily.  #30 with 3 refills.

## 2014-06-14 NOTE — Progress Notes (Signed)
   Subjective:    Patient ID: Eric Hood, male    DOB: 31-Dec-1980, 33 y.o.   MRN: [id number]  HPI Patient here with complaints of increased anxiety.  He has had issues off and on for years.  Some of this appears to be social.  He has increased anxiousness in crowds but also frequently at work.  Sometimes has associated physical symptoms such as palpitations, dyspnea.  He was treated by previous practice with Valium.  He states he had taken some other medications in the past that he states did not "work"-but he cannot recall which ones.  No regular ETOH use.   Denies specific stresses other than work.  Past Medical History  Diagnosis Date  . Back pain   . Reflux   . Esophageal stricture   . Hiatal hernia   . Ulcerative esophagitis    Past Surgical History  Procedure Laterality Date  . Hand surgery Right     reports that he has never smoked. He has never used smokeless tobacco. He reports that he does not drink alcohol or use illicit drugs. family history includes Hypertension in his father and mother. No Known Allergies    Review of Systems  Constitutional: Negative for fever, chills, appetite change and unexpected weight change.  Cardiovascular: Negative for chest pain.  Psychiatric/Behavioral: Negative for hallucinations, dysphoric mood and agitation. The patient is nervous/anxious.        Objective:   Physical Exam  Constitutional: He appears well-developed and well-nourished.  Cardiovascular: Normal rate and regular rhythm.   Pulmonary/Chest: Effort normal and breath sounds normal. No respiratory distress. He has no wheezes. He has no rales.  Musculoskeletal: He exhibits no edema.  Psychiatric: He has a normal mood and affect. His behavior is normal.          Assessment & Plan:  Anxiety.  Sounds largely situational and may have some social anxiety.  We recommended trial of Celexa 20 mg once daily.  We explained that benzodiazepines should not be used for that  type of anxiety.

## 2014-06-15 ENCOUNTER — Ambulatory Visit (INDEPENDENT_AMBULATORY_CARE_PROVIDER_SITE_OTHER): Payer: 59 | Admitting: Family Medicine

## 2014-06-15 ENCOUNTER — Encounter: Payer: Self-pay | Admitting: Family Medicine

## 2014-06-15 VITALS — BP 140/72 | HR 106 | Ht 72.0 in | Wt 187.0 lb

## 2014-06-15 DIAGNOSIS — M949 Disorder of cartilage, unspecified: Secondary | ICD-10-CM

## 2014-06-15 DIAGNOSIS — M999 Biomechanical lesion, unspecified: Secondary | ICD-10-CM

## 2014-06-15 DIAGNOSIS — M899 Disorder of bone, unspecified: Secondary | ICD-10-CM

## 2014-06-15 DIAGNOSIS — F411 Generalized anxiety disorder: Secondary | ICD-10-CM | POA: Insufficient documentation

## 2014-06-15 DIAGNOSIS — M9981 Other biomechanical lesions of cervical region: Secondary | ICD-10-CM

## 2014-06-15 MED ORDER — HYDROXYZINE HCL 25 MG PO TABS: 25.0000 mg | ORAL_TABLET | Freq: Three times a day (TID) | ORAL | Status: DC | PRN

## 2014-06-15 NOTE — Assessment & Plan Note (Signed)
Patient does have some situational anxiety that could be somewhat disability denied times. Patient is seen primary care provider for this and was put on Lexapro but patient does not like the medication. Patient states that the hydroxyzine seems to do relatively well. Patient is using it sparingly. Refill given today.

## 2014-06-15 NOTE — Assessment & Plan Note (Signed)
Decision today to treat with OMT was based on Physical Exam  After verbal consent patient was treated with HVLA, ME, FPR techniques in  Cervical, thoracic, lumbar and sacral areas  Patient tolerated the procedure well with improvement in symptoms  Patient given exercises, stretches and lifestyle modifications  See medications in patient instructions if given  Patient will follow up in 3-4 weeks.               

## 2014-06-15 NOTE — Patient Instructions (Signed)
You are doing great COntinue the other medications.  Sacroiliac Joint Mobilization and Rehab 1. Work on pretzel stretching, shoulder back and leg draped in front. 3-5 sets, 30 sec.. 2. hip abductor rotations. standing, hip flexion and rotation outward then inward. 3 sets, 15 reps. when can do comfortably, add ankle weights starting at 2 pounds.  3. cross over stretching - shoulder back to ground, same side leg crossover. 3-5 sets for 30 min..  4. rolling up and back knees to chest and rocking. 5. sacral tilt - 5 sets, hold for 5-10 seconds Continue the back exercises at least 3 times a week.  Come back in 5-6 weeks.

## 2014-06-15 NOTE — Assessment & Plan Note (Signed)
Continues to respond very well to conservative management. Patient still would not like to go to formal physical therapy. Patient is improving. Patient will continue with home exercises and the natural supplementations. Patient will come back and see me again in 5-6 weeks for further evaluation and treatment.

## 2014-06-15 NOTE — Progress Notes (Signed)
  [name] [place], [place] [address] Phone: [phone] Subjective:     CC: Thoracic back pain, follow up  WER:[id number] Eric Hood is a 33 y.o. male coming in with complaint of back pain. Patient was seen previously and had more of a muscle imbalance.  Patient has been responding very well to conservative therapy including home exercises, over-the-counter medications including Detrol supplementations. Patient states that overall his back is doing relatively well. Some mild low back pain which is new for him this seems to be very intermittent. Patient states overall he can do daily activities and is resting comfortably at night. Patient states has not had any of the soreness that is kept him out of work which she was previously having started seeing a regular basis.  In addition patient does have some underlying side he. Patient does respond very well to the hydroxyzine. Patient was given 30 pills greater than 3 months ago and states that they have been very helpful. Patient uses him as needed and is wondering if he can have a refill.     Past medical history, social, surgical and family history all reviewed in electronic medical record.   Review of Systems: No headache, visual changes, nausea, vomiting, diarrhea, constipation, dizziness, abdominal pain, skin rash, fevers, chills, night sweats, weight loss, swollen lymph nodes, body aches, joint swelling, muscle aches, chest pain, shortness of breath, mood changes.   Objective Blood pressure 140/72, pulse 106, height 6' (1.829 m), weight 187 lb (84.823 kg), SpO2 97.00%.  General: No apparent distress alert and oriented x3 mood and affect normal, dressed appropriately.  HEENT: Pupils equal, extraocular movements intact  Respiratory: Patient's speak in full sentences and does not appear short of breath  Cardiovascular: No lower extremity edema, non tender, no erythema  Skin: Warm dry intact  with no signs of infection or rash on extremities or on axial skeleton.  Abdomen: Soft nontender  Neuro: Cranial nerves II through XII are intact, neurovascularly intact in all extremities with 2+ DTRs and 2+ pulses.  Lymph: No lymphadenopathy of posterior or anterior cervical chain or axillae bilaterally.  Gait normal with good balance and coordination.  MSK:  Non tender with full range of motion and good stability and symmetric strength and tone of shoulders, elbows, wrist, hip, knee and ankles bilaterally.  Back Exam:  Inspection: Mild increase in kyphosis of the upper thoracic spine Motion: Flexion 40 deg, Extension 35 deg, Side Bending to 45 deg bilaterally,  Rotation to 45 deg bilaterally  SLR laying: Negative  XSLR laying: Negative  Palpable tenderness: None. FABER: negative. Sensory change: Gross sensation intact to all lumbar and sacral dermatomes.  Reflexes: 2+ at both patellar tendons, 2+ at achilles tendons, Babinski's downgoing.  Strength at foot  Plantar-flexion: 5/5 Dorsi-flexion: 5/5 Eversion: 5/5 Inversion: 5/5  Leg strength  Quad: 5/5 Hamstring: 5/5 Hip flexor: 5/5 Hip abductors:4/5  Gait unremarkable. Patient's movements of the scapular significantly improved but still has some mild weakness of the left side.  OMT Physical Exam  Cervical  C5 flexed rotated and side bent right C3 flexed rotated and side bent left  Thoracic T5 extended rotated inside that right T7 extended rotated and side bent left  Lumbar L2 flexed rotated and side bent right  Sacrum Left on left  Illium Neutral    Impression and Recommendations:     This case required medical decision making of moderate complexity.

## 2014-07-20 ENCOUNTER — Encounter: Payer: Self-pay | Admitting: Family Medicine

## 2014-07-20 ENCOUNTER — Ambulatory Visit (INDEPENDENT_AMBULATORY_CARE_PROVIDER_SITE_OTHER): Payer: 59 | Admitting: Family Medicine

## 2014-07-20 VITALS — BP 132/74 | HR 105 | Ht 72.0 in | Wt 190.0 lb

## 2014-07-20 DIAGNOSIS — M999 Biomechanical lesion, unspecified: Secondary | ICD-10-CM

## 2014-07-20 DIAGNOSIS — M9902 Segmental and somatic dysfunction of thoracic region: Secondary | ICD-10-CM

## 2014-07-20 DIAGNOSIS — M9903 Segmental and somatic dysfunction of lumbar region: Secondary | ICD-10-CM

## 2014-07-20 DIAGNOSIS — M9904 Segmental and somatic dysfunction of sacral region: Secondary | ICD-10-CM

## 2014-07-20 DIAGNOSIS — M899 Disorder of bone, unspecified: Secondary | ICD-10-CM

## 2014-07-20 DIAGNOSIS — M9901 Segmental and somatic dysfunction of cervical region: Secondary | ICD-10-CM

## 2014-07-20 MED ORDER — CYCLOBENZAPRINE HCL 10 MG PO TABS: 10.0000 mg | ORAL_TABLET | Freq: Three times a day (TID) | ORAL | Status: DC | PRN

## 2014-07-20 MED ORDER — TRAMADOL HCL 50 MG PO TABS: 100.0000 mg | ORAL_TABLET | Freq: Every evening | ORAL | Status: DC | PRN

## 2014-07-20 NOTE — Patient Instructions (Addendum)
Good to see you.  You are doing great.  I hope you don't need them but can use Flexeril OR tramadol at night if needed.  Ice when you are done with work at work.  See me again in 3 weeks.

## 2014-07-20 NOTE — Progress Notes (Signed)
  [name] [place], [place] [address] Phone: [phone] Subjective:     CC: Thoracic back pain, follow up  BJY:[id number] Dontavis Tschantz is a 33 y.o. male coming in with complaint of back pain. Patient was seen previously and had more of a muscle imbalance.  Had been responding well, but now increasing the amount of pain with increasing work. Patient states most of it seems to be in the mid thoracic back. Patient is not taking any chronic pain medications at this point. Patient does have a history of opiate abuse previously. Patient had been taking the over-the-counter medicines and was noticing some mild improvement.  In addition patient does have some underlying side he. Patient does respond very well to the hydroxyzine. Patient states he is not as responding as well as he was previously.     Past medical history, social, surgical and family history all reviewed in electronic medical record.   Review of Systems: No headache, visual changes, nausea, vomiting, diarrhea, constipation, dizziness, abdominal pain, skin rash, fevers, chills, night sweats, weight loss, swollen lymph nodes, body aches, joint swelling, muscle aches, chest pain, shortness of breath, mood changes.   Objective Blood pressure 132/74, pulse 105, height 6' (1.829 m), weight 190 lb (86.183 kg), SpO2 98.00%.  General: No apparent distress alert and oriented x3 mood and affect normal, dressed appropriately. Patient does appear to be uncomfortable today. HEENT: Pupils equal, extraocular movements intact  Respiratory: Patient's speak in full sentences and does not appear short of breath  Cardiovascular: No lower extremity edema, non tender, no erythema  Skin: Warm dry intact with no signs of infection or rash on extremities or on axial skeleton.  Abdomen: Soft nontender  Neuro: Cranial nerves II through XII are intact, neurovascularly intact in all extremities with 2+ DTRs and  2+ pulses.  Lymph: No lymphadenopathy of posterior or anterior cervical chain or axillae bilaterally.  Gait normal with good balance and coordination.  MSK:  Non tender with full range of motion and good stability and symmetric strength and tone of shoulders, elbows, wrist, hip, knee and ankles bilaterally.  Back Exam:  Inspection: Mild increase in kyphosis of the upper thoracic spine Motion: Flexion 40 deg, Extension 35 deg, Side Bending to 45 deg bilaterally,  Rotation to 45 deg bilaterally  SLR laying: Negative  XSLR laying: Negative  Palpable tenderness: None. FABER: negative. Sensory change: Gross sensation intact to all lumbar and sacral dermatomes.  Reflexes: 2+ at both patellar tendons, 2+ at achilles tendons, Babinski's downgoing.  Strength at foot  Plantar-flexion: 5/5 Dorsi-flexion: 5/5 Eversion: 5/5 Inversion: 5/5  Leg strength  Quad: 5/5 Hamstring: 5/5 Hip flexor: 5/5 Hip abductors:4/5  Gait unremarkable. Continues to have scapular dysfunction  OMT Physical Exam  Cervical  C6 flexed rotated and side bent right C3 flexed rotated and side bent left  Thoracic T5 extended rotated inside that right T7 extended rotated and side bent left  Lumbar L2 flexed rotated and side bent right  Sacrum Left on left  Illium Neutral    Impression and Recommendations:     This case required medical decision making of moderate complexity.

## 2014-07-20 NOTE — Assessment & Plan Note (Signed)
Decision today to treat with OMT was based on Physical Exam  After verbal consent patient was treated with HVLA, ME, FPR techniques in Cervical, thoracic,  lumbar and sacral areas  Patient tolerated the procedure well with improvement in symptoms  Patient given exercises, stretches and lifestyle modifications  See medications in patient instructions if given  Patient will follow up in 3 weeks

## 2014-07-20 NOTE — Assessment & Plan Note (Signed)
Discussed with patient at this time. Patient was in much more distress and he has been previously. At this point I do think the patient could use some type of medication at night. Patient was given a prescriptive for Flexeril which she states has not helped him previously and I did give him a small prescription for tramadol. Discuss it did not want to use these on a long-term basis. Patient understands this. Patient will continue with the exercises and will come back more frequently at this time. Patient come back in 3 weeks for further evaluation and treatment.  Spent greater than 25 minutes with patient face-to-face and had greater than 50% of counseling including as described above in assessment and plan.

## 2014-08-10 ENCOUNTER — Ambulatory Visit: Payer: 59 | Admitting: Family Medicine

## 2014-08-10 DIAGNOSIS — Z0289 Encounter for other administrative examinations: Secondary | ICD-10-CM

## 2014-08-18 ENCOUNTER — Ambulatory Visit (INDEPENDENT_AMBULATORY_CARE_PROVIDER_SITE_OTHER): Payer: 59 | Admitting: Family Medicine

## 2014-08-18 ENCOUNTER — Encounter: Payer: Self-pay | Admitting: Family Medicine

## 2014-08-18 VITALS — BP 132/74 | HR 103 | Ht 72.0 in | Wt 183.0 lb

## 2014-08-18 DIAGNOSIS — M999 Biomechanical lesion, unspecified: Secondary | ICD-10-CM

## 2014-08-18 DIAGNOSIS — M9903 Segmental and somatic dysfunction of lumbar region: Secondary | ICD-10-CM

## 2014-08-18 DIAGNOSIS — M546 Pain in thoracic spine: Secondary | ICD-10-CM

## 2014-08-18 DIAGNOSIS — M9904 Segmental and somatic dysfunction of sacral region: Secondary | ICD-10-CM

## 2014-08-18 DIAGNOSIS — M9902 Segmental and somatic dysfunction of thoracic region: Secondary | ICD-10-CM

## 2014-08-18 DIAGNOSIS — M9901 Segmental and somatic dysfunction of cervical region: Secondary | ICD-10-CM

## 2014-08-18 NOTE — Assessment & Plan Note (Signed)
Decision today to treat with OMT was based on Physical Exam  After verbal consent patient was treated with HVLA, ME, FPR techniques in Cervical, thoracic,  lumbar and sacral areas  Patient tolerated the procedure well with improvement in symptoms  Patient given exercises, stretches and lifestyle modifications  See medications in patient instructions if given  Patient will follow up in 4-5 weeks

## 2014-08-18 NOTE — Assessment & Plan Note (Signed)
Discussed with patient again at great length the importance of actually doing the exercises on a regular basis. We discussed the possibility of formal physical therapy which patient declined. We discussed an icing regimen. Patient is going to try to be more religious on doing his own activity. Patient's pain is not stopping him from any activities at this time and he has not taken any over-the-counter medications. We will continue to monitor and I would like patient to come back in 4-5 weeks for further evaluation and treatment.

## 2014-08-18 NOTE — Patient Instructions (Signed)
Good to see you Ice is good.  Consider flonase if allergies act up.  You are doing great.  Really consider being religious with the exercises.  Lets go 4 weeks.

## 2014-08-18 NOTE — Progress Notes (Signed)
  [name] [place], [place] [address] Phone: [phone] Subjective:     CC: Thoracic back pain, follow up  SMO:[id number] Eric Hood is a 33 y.o. male coming in with complaint of back pain. Patient was seen previously and had more of a muscle imbalance. Patient was having an exacerbation of his low back pain. Patient states though that since I seen him he is doing relatively well. Denies any radiation of pain. Patient did not take a muscle relaxer or the tramadol. Patient still has some mild soreness but overall is doing relatively well he states. Denies any nighttime pain.  In addition patient does have some underlying anxiety. Patient does respond very well to the hydroxyzine. Patient states he is not as responding as well as he was previously.     Past medical history, social, surgical and family history all reviewed in electronic medical record.   Review of Systems: No headache, visual changes, nausea, vomiting, diarrhea, constipation, dizziness, abdominal pain, skin rash, fevers, chills, night sweats, weight loss, swollen lymph nodes, body aches, joint swelling, muscle aches, chest pain, shortness of breath, mood changes.   Objective Blood pressure 132/74, pulse 103, height 6' (1.829 m), weight 183 lb (83.008 kg), SpO2 97 %.  General: No apparent distress alert and oriented x3 mood and affect normal, dressed appropriately. Patient does appear to be uncomfortable today. HEENT: Pupils equal, extraocular movements intact  Respiratory: Patient's speak in full sentences and does not appear short of breath  Cardiovascular: No lower extremity edema, non tender, no erythema  Skin: Warm dry intact with no signs of infection or rash on extremities or on axial skeleton.  Abdomen: Soft nontender  Neuro: Cranial nerves II through XII are intact, neurovascularly intact in all extremities with 2+ DTRs and 2+ pulses.  Lymph: No lymphadenopathy of  posterior or anterior cervical chain or axillae bilaterally.  Gait normal with good balance and coordination.  MSK:  Non tender with full range of motion and good stability and symmetric strength and tone of shoulders, elbows, wrist, hip, knee and ankles bilaterally.  Back Exam:  Inspection: Mild increase in kyphosis of the upper thoracic spine Motion: Flexion 40 deg, Extension 35 deg, Side Bending to 45 deg bilaterally,  Rotation to 45 deg bilaterally  SLR laying: Negative  XSLR laying: Negative  Palpable tenderness: None. FABER: negative. Sensory change: Gross sensation intact to all lumbar and sacral dermatomes.  Reflexes: 2+ at both patellar tendons, 2+ at achilles tendons, Babinski's downgoing.  Strength at foot  Plantar-flexion: 5/5 Dorsi-flexion: 5/5 Eversion: 5/5 Inversion: 5/5  Leg strength  Quad: 5/5 Hamstring: 5/5 Hip flexor: 5/5 Hip abductors:4/5  Gait unremarkable. Continues to have scapular dysfunction minimal improvement  OMT Physical Exam  Cervical  C6 flexed rotated and side bent right C2 flexed rotated and side bent left  Thoracic T5 extended rotated inside that right T8 extended rotated and side bent left  Lumbar L2 flexed rotated and side bent right  Sacrum Left on left  Illium Neutral    Impression and Recommendations:     This case required medical decision making of moderate complexity.

## 2014-09-05 ENCOUNTER — Other Ambulatory Visit: Payer: Self-pay | Admitting: Family Medicine

## 2014-09-07 ENCOUNTER — Ambulatory Visit (INDEPENDENT_AMBULATORY_CARE_PROVIDER_SITE_OTHER): Payer: 59 | Admitting: Gastroenterology

## 2014-09-07 ENCOUNTER — Encounter: Payer: Self-pay | Admitting: Gastroenterology

## 2014-09-07 VITALS — BP 132/72 | HR 96 | Ht 72.0 in | Wt 180.1 lb

## 2014-09-07 DIAGNOSIS — K2100 Gastro-esophageal reflux disease with esophagitis, without bleeding: Secondary | ICD-10-CM

## 2014-09-07 DIAGNOSIS — K222 Esophageal obstruction: Secondary | ICD-10-CM

## 2014-09-07 DIAGNOSIS — R1314 Dysphagia, pharyngoesophageal phase: Secondary | ICD-10-CM

## 2014-09-07 NOTE — Patient Instructions (Signed)

## 2014-09-07 NOTE — Telephone Encounter (Signed)
Refill done.  

## 2014-09-07 NOTE — Progress Notes (Signed)
    History of Present Illness: This is a 33 year old male with Grade D erosive esophagitis and an esophageal stricture dilated to 12 mm earlier this year. He did not return for follow up EGD dilation as recommended. He has had ongoing problems with dysphagia to solid and soft foods. No liquid dysphagia. His reflux symptoms are under very good control on his current medication.  Current Medications, Allergies, Past Medical History, Past Surgical History, Family History and Social History were reviewed in [employer] record.  Physical Exam: General: Well developed , well nourished, no acute distress Head: Normocephalic and atraumatic Eyes:  sclerae anicteric, EOMI Ears: Normal auditory acuity Mouth: No deformity or lesions Lungs: Clear throughout to auscultation Heart: Regular rate and rhythm; no murmurs, rubs or bruits Abdomen: Soft, non tender and non distended. No masses, hepatosplenomegaly or hernias noted. Normal Bowel sounds Musculoskeletal: Symmetrical with no gross deformities  Pulses:  Normal pulses noted Extremities: No clubbing, cyanosis, edema or deformities noted Neurological: Alert oriented x 4, grossly nonfocal Psychological:  Alert and cooperative. Normal mood and affect  Assessment and Recommendations:  1. Grade D esophagitis. Esophageal stricture. Persistent solid/soft food dysphagia. Continue pantoprazole bid. Schedule EGD/dilation. The risks, benefits, and alternatives to endoscopy with possible biopsy and possible dilation were discussed with the patient and they consent to proceed.

## 2014-09-08 ENCOUNTER — Ambulatory Visit (AMBULATORY_SURGERY_CENTER): Payer: 59 | Admitting: Gastroenterology

## 2014-09-08 ENCOUNTER — Encounter: Payer: Self-pay | Admitting: Gastroenterology

## 2014-09-08 VITALS — BP 120/76 | HR 91 | Temp 98.9°F | Resp 14 | Ht 72.0 in | Wt 180.0 lb

## 2014-09-08 DIAGNOSIS — R1314 Dysphagia, pharyngoesophageal phase: Secondary | ICD-10-CM

## 2014-09-08 DIAGNOSIS — K2100 Gastro-esophageal reflux disease with esophagitis, without bleeding: Secondary | ICD-10-CM

## 2014-09-08 DIAGNOSIS — K2210 Ulcer of esophagus without bleeding: Secondary | ICD-10-CM

## 2014-09-08 DIAGNOSIS — K222 Esophageal obstruction: Secondary | ICD-10-CM

## 2014-09-08 MED ORDER — SODIUM CHLORIDE 0.9 % IV SOLN: 500.0000 mL | INTRAVENOUS | Status: DC

## 2014-09-08 NOTE — Patient Instructions (Signed)
YOU HAD AN ENDOSCOPIC PROCEDURE TODAY AT [facility]: Refer to the procedure report that was given to you for any specific questions about what was found during the examination.  If the procedure report does not answer your questions, please call your gastroenterologist to clarify.  If you requested that your care partner not be given the details of your procedure findings, then the procedure report has been included in a sealed envelope for you to review at your convenience later.  YOU SHOULD EXPECT: Some feelings of bloating in the abdomen. Passage of more gas than usual.  Walking can help get rid of the air that was put into your GI tract during the procedure and reduce the bloating. If you had a lower endoscopy (such as a colonoscopy or flexible sigmoidoscopy) you may notice spotting of blood in your stool or on the toilet paper. If you underwent a bowel prep for your procedure, then you may not have a normal bowel movement for a few days.  DIET:   FOLLOW DILATATION DIET GIVEN TO YOU TODAY   ACTIVITY: Your care partner should take you home directly after the procedure.  You should plan to take it easy, moving slowly for the rest of the day.  You can resume normal activity the day after the procedure however you should NOT DRIVE or use heavy machinery for 24 hours (because of the sedation medicines used during the test).    SYMPTOMS TO REPORT IMMEDIATELY: A gastroenterologist can be reached at any hour.  During normal business hours, 8:30 AM to 5:00 PM Monday through Friday, call [phone].  After hours and on weekends, please call the GI answering service at [phone] who will take a message and have the physician on call contact you.     Following upper endoscopy (EGD)  Vomiting of blood or coffee ground material  New chest pain or pain under the shoulder blades  Painful or persistently difficult swallowing  New shortness of breath  Fever of 100F or  higher  Black, tarry-looking stools  FOLLOW UP: If any biopsies were taken you will be contacted by phone or by letter within the next 1-3 weeks.  Call your gastroenterologist if you have not heard about the biopsies in 3 weeks.  Our staff will call the home number listed on your records the next business day following your procedure to check on you and address any questions or concerns that you may have at that time regarding the information given to you following your procedure. This is a courtesy call and so if there is no answer at the home number and we have not heard from you through the emergency physician on call, we will assume that you have returned to your regular daily activities without incident.  SIGNATURES/CONFIDENTIALITY: You and/or your care partner have signed paperwork which will be entered into your electronic medical record.  These signatures attest to the fact that that the information above on your After Visit Summary has been reviewed and is understood.  Full responsibility of the confidentiality of this discharge information lies with you and/or your care-partner.    CONTINUE ANTI REFLUX REGIMEN   CONTINUE ANT REFULX MEDICATION TWICE DAILY  REPEAT ENDOSCOPY WITH DILATATION IN ONE MONTH

## 2014-09-08 NOTE — Progress Notes (Signed)
Endoscopy w/dil ordered for one month ,no appoit available until Jan !4 therefore made appoint for jan 14 ,but left message for Dr. Lynne Leader nurse ,Sherri to work on finding an earlier appoint. For pt . Pt and carepartner aware of this and Dr Fuller Plan aware as well.

## 2014-09-08 NOTE — Progress Notes (Signed)
Called to room to assist during endoscopic procedure.  Patient ID and intended procedure confirmed with present staff. Received instructions for my participation in the procedure from the performing physician.  

## 2014-09-08 NOTE — Progress Notes (Signed)
No PV made for next endo. But Eric Hood reviewed and printed instructions for pt for his Oct 29 2014 Endoscopy.

## 2014-09-08 NOTE — Op Note (Signed)
[facility]  [address] [place], [id number]   ENDOSCOPY PROCEDURE REPORT  PATIENT: Eric Hood, Eric Hood  MR#: [id number] BIRTHDATE: 1981/08/24 , 33  yrs. old GENDER: male ENDOSCOPIST: Ladene Artist, MD, [facility] PROCEDURE DATE:  09/08/2014 PROCEDURE:  EGD w/ biopsy and EGD w/ balloon dilation ASA CLASS:     Class II INDICATIONS:  dysphagia, history of esophageal reflux, and therapeutic procedure. MEDICATIONS: Monitored anesthesia care and Propofol 250 mg IV TOPICAL ANESTHETIC: none DESCRIPTION OF PROCEDURE: After the risks benefits and alternatives of the procedure were thoroughly explained, informed consent was obtained.  The LB [id number] endoscope was introduced through the mouth and advanced to the second portion of the duodenum , Without limitations.  The instrument was slowly withdrawn as the mucosa was fully examined.  ESOPHAGUS: There was a long friable, benign appearing and fibrotic stricture, with an inner diameter of 82mm, in the mid esophagus. The stricture was not traversable.  Multiple biopsies were performed.  Using a TTS-balloon the stricture was dilated gradually from 10 mm  to 12 mm.  The balloon was held inflated for 1 minute. Following this dilation, there was a small mucosal rent and the endoscope easily traversed the stricture. There was LA Class D esophagitis in the distal and mid esophagus (Mucosal breaks involving more than 75% of esophageal circumference) noted. Multiple biopsies were performed in the distal esophagus.  The GE junction was noted at 38 cm. STOMACH: The mucosa of the stomach appeared normal. DUODENUM: The duodenal mucosa showed no abnormalities.  Retroflexed views revealed a 5 cm hiatal hernia.     The scope was then withdrawn from the patient and the procedure completed.  COMPLICATIONS: There were no immediate complications.  ENDOSCOPIC IMPRESSION: 1.   Mid esophageal stricture; multiple biopsies  performed; TTS-balloon dilation 2.   LA Class D esophagitis; multiple biopsies performed 3.   5 cm hiatal hernia  RECOMMENDATIONS: 1.  Anti-reflux regimen long term 2.  Await pathology results 3.  Continue PPI bid long term 4.  Post dilation instructions 5.  Endoscopy with dilation in 1 month  eSigned:  Ladene Artist, MD, [facility] 09/08/2014 10:43 AM

## 2014-09-08 NOTE — Progress Notes (Signed)
Report to PACU, RN, vss, BBS= Clear.  

## 2014-09-09 ENCOUNTER — Telehealth: Payer: Self-pay | Admitting: *Deleted

## 2014-09-09 NOTE — Telephone Encounter (Signed)
  Follow up Call-  Call back number 09/08/2014 04/10/2014  Post procedure Call Back phone  # [phone]  Permission to leave phone message Yes Yes     Patient questions:  Do you have a fever, pain , or abdominal swelling? No. Pain Score  0 *  Have you tolerated food without any problems? Yes.    Have you been able to return to your normal activities? Yes.    Do you have any questions about your discharge instructions: Diet   No. Medications  No. Follow up visit  No.  Do you have questions or concerns about your Care? No.  Actions: * If pain score is 4 or above: No action needed, pain <4.

## 2014-09-15 ENCOUNTER — Ambulatory Visit: Payer: 59 | Admitting: Family Medicine

## 2014-09-18 ENCOUNTER — Encounter: Payer: Self-pay | Admitting: Gastroenterology

## 2014-09-23 ENCOUNTER — Encounter: Payer: Self-pay | Admitting: Gastroenterology

## 2014-09-29 ENCOUNTER — Ambulatory Visit (INDEPENDENT_AMBULATORY_CARE_PROVIDER_SITE_OTHER): Payer: 59 | Admitting: Family Medicine

## 2014-09-29 ENCOUNTER — Encounter: Payer: Self-pay | Admitting: Family Medicine

## 2014-09-29 ENCOUNTER — Telehealth: Payer: Self-pay | Admitting: Family Medicine

## 2014-09-29 VITALS — BP 128/83 | HR 86 | Temp 99.0°F | Resp 22 | Ht 72.0 in | Wt 189.0 lb

## 2014-09-29 DIAGNOSIS — R062 Wheezing: Secondary | ICD-10-CM

## 2014-09-29 DIAGNOSIS — J069 Acute upper respiratory infection, unspecified: Secondary | ICD-10-CM

## 2014-09-29 DIAGNOSIS — J209 Acute bronchitis, unspecified: Secondary | ICD-10-CM

## 2014-09-29 MED ORDER — IPRATROPIUM BROMIDE 0.02 % IN SOLN
0.5000 mg | Freq: Once | RESPIRATORY_TRACT | Status: AC
  Administered 2014-09-29: 0.5 mg via RESPIRATORY_TRACT

## 2014-09-29 MED ORDER — PREDNISONE 20 MG PO TABS: ORAL_TABLET | ORAL | Status: DC

## 2014-09-29 MED ORDER — ALBUTEROL SULFATE HFA 108 (90 BASE) MCG/ACT IN AERS: 1.0000 | INHALATION_SPRAY | RESPIRATORY_TRACT | Status: DC | PRN

## 2014-09-29 MED ORDER — ALBUTEROL SULFATE (2.5 MG/3ML) 0.083% IN NEBU
2.5000 mg | INHALATION_SOLUTION | Freq: Once | RESPIRATORY_TRACT | Status: AC
  Administered 2014-09-29: 2.5 mg via RESPIRATORY_TRACT

## 2014-09-29 NOTE — Progress Notes (Signed)
Pre visit review using our clinic review tool, if applicable. No additional management support is needed unless otherwise documented below in the visit note. 

## 2014-09-29 NOTE — Progress Notes (Signed)
OFFICE NOTE  09/29/2014  CC:  Chief Complaint  Patient presents with  . Nasal Congestion    x 2 days  . Cough  . Wheezing   HPI: Patient is a 33 y.o. Caucasian male who is here for respiratory complaints.  Onset 3-4 days ago, nasal congestion, wheezing, coughing, feeling a little short of breath.  No fever.  Cough is productive of greyish, thick mucous. Denies any hx of asthma.  He is not a smoker.  No flu vaccine this season. Took robitussin and alka seltzer for sx's for this illness.  Some soreness in throat from coughing.  No HA.  No n/v/d.  No rash.    Pertinent PMH:  Past medical, surgical, social, and family history reviewed and no changes are noted since last office visit.  MEDS:  Protonix 40mg  qd, hydroxyzine 25mg  tid prn  PE: Blood pressure 128/83, pulse 86, temperature 99 F (37.2 C), temperature source Temporal, resp. rate 22, height 6' (1.829 m), weight 189 lb (85.73 kg), SpO2 94 %. Gen: tired but nontoxic appearing.  Alert and oriented x 4. HEENT: eyes without injection, drainage, or swelling.  Ears: EACs clear, TMs with normal light reflex and landmarks.  Nose: Clear rhinorrhea, with some dried, crusty exudate adherent to mildly injected mucosa.  No purulent d/c.  No paranasal sinus TTP.  No facial swelling.  Throat and mouth without focal lesion.  No pharyngial swelling, erythema, or exudate.   Neck: supple, no LAD.   LUNGS: diffuse coarse insp/exp wheeze that partially clears with coughing, prolonged expiratory phase, nonlabored resps.   CV: RRR, no m/r/g. EXT: no c/c/e SKIN: no rash  LAB: none  IMPRESSION AND PLAN:  Viral URI with acute bronchitis with RAD component.  He improved subjectively and his lung exam improved significantly after 2.5mg  albuterol/0.5mg  atrovent nebulizer treatment in office today. I rx'd prednisone 40mg  qd x 5d, then 20mg  qd x 5d. Ventolin HFA 1-2 puffs q4h prn.  CMA Jacklynn Ganong did inhaler education with pt today. Mucinex DM OTC  recommended. Fluids, rest, work note given to pt for today, tomorrow, and the next day. Signs/symptoms to call or return for were reviewed and pt expressed understanding.  An After Visit Summary was printed and given to the patient.  FOLLOW UP: prn

## 2014-09-29 NOTE — Patient Instructions (Signed)
[facility] DM over the counter and take as directed on the packaging. Drink lots of clear liquids. REST!

## 2014-09-29 NOTE — Telephone Encounter (Signed)
[facility] Day - Client [facility] Patient Name: Eric Hood Gender: Male DOB: 1981-05-19 Age: 33 Y 3 M 23 D Return Phone Number: [phone] (Primary) Address: [address] City/State/Zip: [place] [address] Client [facility] Day - Client Client Site [facility] - Day Physician Carolann Littler Contact Type Call Call Type Triage / Clinical Relationship To Patient Self Return Phone Number [phone]) [phone] (Primary) Chief Complaint Cough Initial Comment Caller states has cough and chest congestion; PreDisposition Did not know what to do Nurse Assessment Nurse: Leilani Merl, RN, Heather Date/Time (Eastern Time): 09/29/2014 9:45:14 AM Confirm and document reason for call. If symptomatic, describe symptoms. ---Caller states has cough and chest congestion that started about 2 days ago and this morning it is worse than it has been. His throat is hurting when he coughs. Has the patient traveled out of the country within the last 30 days? ---Not Applicable Does the patient require triage? ---Yes Related visit to physician within the last 2 weeks? ---No Does the PT have any chronic conditions? (i.e. diabetes, asthma, etc.) ---No Guidelines Guideline Title Affirmed Question Affirmed Notes Nurse Date/Time (Eastern Time) Cough - Acute Productive SEVERE coughing spells (e.g., whooping sound after coughing, vomiting after coughing) Standifer, RN, Heather 09/29/2014 9:46:50 AM Disp. Time Eilene Ghazi Time) Disposition Final User 09/29/2014 9:54:57 AM Call Completed Standifer, RN, Nira Conn 09/29/2014 9:52:13 AM See Physician within 24 Hours Yes Standifer, RN, Soyla Murphy Understands: Yes Disagree/Comply: Comply Care Advice Given Per Guideline PLEASE NOTE: All timestamps contained within this report are represented as [place] Standard Time. CONFIDENTIALTY NOTICE: This fax  transmission is intended only for the addressee. It contains information that is legally privileged, confidential or otherwise protected from use or disclosure. If you are not the intended recipient, you are strictly prohibited from reviewing, disclosing, copying using or disseminating any of this information or taking any action in reliance on or regarding this information. If you have received this fax in error, please notify us immediately by telephone so that we can arrange for its return to [place]. Phone: [phone], Toll-Free: [phone], Fax: [phone] Page: 2 of 2 Call Id: [id number] Care Advice Given Per Guideline SEE PHYSICIAN WITHIN 24 HOURS: * IF OFFICE WILL BE OPEN: You need to be examined within the next 24 hours. Call your doctor when the office opens, and make an appointment. CALL BACK IF: * Difficulty breathing occurs * You become worse. CARE ADVICE given per Cough - Acute Productive (Adult) guideline. After Care Instructions Given Call Event Type User Date / Time Description Comments User: Jerrye Beavers Date/Time Eilene Ghazi Time): 09/29/2014 9:40:14 AM Kenney Houseman w/ Jacklynn Ganong transferred; User: Ave Filter, RN Date/Time Eilene Ghazi Time): 09/29/2014 9:54:45 AM Appt made at the [facility] office at 11:30 am with Dr. Anitra Lauth. Referrals REFERRED TO PCP OFFICE

## 2014-10-02 ENCOUNTER — Encounter: Payer: Self-pay | Admitting: Gastroenterology

## 2014-10-02 ENCOUNTER — Ambulatory Visit (AMBULATORY_SURGERY_CENTER): Payer: 59 | Admitting: Gastroenterology

## 2014-10-02 VITALS — BP 116/60 | HR 71 | Temp 99.4°F | Resp 20 | Ht 72.0 in | Wt 180.0 lb

## 2014-10-02 DIAGNOSIS — K222 Esophageal obstruction: Secondary | ICD-10-CM

## 2014-10-02 DIAGNOSIS — K2100 Gastro-esophageal reflux disease with esophagitis, without bleeding: Secondary | ICD-10-CM

## 2014-10-02 DIAGNOSIS — R1314 Dysphagia, pharyngoesophageal phase: Secondary | ICD-10-CM

## 2014-10-02 MED ORDER — SODIUM CHLORIDE 0.9 % IV SOLN: 500.0000 mL | INTRAVENOUS | Status: DC

## 2014-10-02 NOTE — Progress Notes (Signed)
Called to room to assist during endoscopic procedure.  Patient ID and intended procedure confirmed with present staff. Received instructions for my participation in the procedure from the performing physician.  

## 2014-10-02 NOTE — Progress Notes (Signed)
Report to PACU, RN, vss, BBS= Clear.  

## 2014-10-02 NOTE — Op Note (Signed)
[place]  [address] [place], [id number]   ENDOSCOPY PROCEDURE REPORT  PATIENT: Eric Hood, Eric Hood  MR#: [id number] BIRTHDATE: November 26, 1980 , 33  yrs. old GENDER: male ENDOSCOPIST: Ladene Artist, MD, [facility] PROCEDURE DATE:  10/02/2014 PROCEDURE:  EGD w/ wire guided (savary) dilation ASA CLASS:     Class II INDICATIONS:  dysphagia, history of reflux esophagitis, and dilation of esophageal stricture. MEDICATIONS: Monitored anesthesia care and Propofol 300 mg IV TOPICAL ANESTHETIC: none DESCRIPTION OF PROCEDURE: After the risks benefits and alternatives of the procedure were thoroughly explained, informed consent was obtained.  The LB [id number] endoscope was introduced through the mouth and advanced to the second portion of the duodenum , Without limitations.  The instrument was slowly withdrawn as the mucosa was fully examined.    ESOPHAGUS: There was a fibrotic and benign appearing stricture with an inner diameter of 3mm in the mid esophagus.  The stricture was traversable with mild resistance.  The stricture was dilated using a 11mm (36Fr) savary dilator over guidewire.  The stricture was dilated using a 52mm (39Fr) savary dilator over guidewire.  The stricture was dilated using a 52mm (42Fr) savary dilator over guidewire. Mild resistance and minimal heme noted with each dilator. There was LA Class C esophagitis (Mucosal breaks continuous between > 2 mucosal folds, but involving less than 75% of the esophageal circumference) noted in the mid and distal esophagus. The esophagus was otherwise normal. STOMACH: The mucosa and folds of the stomach appeared normal. DUODENUM: The duodenal mucosa showed no abnormalities in the bulb and 2nd part of the duodenum.  Retroflexed views revealed a 5 cm hiatal hernia.  The scope was then withdrawn from the patient and the procedure completed.  COMPLICATIONS: There were no immediate complications.  ENDOSCOPIC  IMPRESSION: 1.   Esopahageal stricture; dilated using savary dilators over guidewire 2.   LA Class C esophagitis noted 3.   5 cm hiatal hernia  RECOMMENDATIONS: 1.  Anti-reflux regimen long term 2.  Continue PPI bid long term 3.  Post dilation instructions 4.   Repeat endoscopy with dilation in 4-6 weeks  eSigned:  Ladene Artist, MD, [facility] 10/02/2014 3:29 PM

## 2014-10-02 NOTE — Patient Instructions (Addendum)
YOU HAD AN ENDOSCOPIC PROCEDURE TODAY AT [facility]: Refer to the procedure report that was given to you for any specific questions about what was found during the examination.  If the procedure report does not answer your questions, please call your gastroenterologist to clarify.  If you requested that your care partner not be given the details of your procedure findings, then the procedure report has been included in a sealed envelope for you to review at your convenience later.  YOU SHOULD EXPECT: Some feelings of bloating in the abdomen. Passage of more gas than usual.  Walking can help get rid of the air that was put into your GI tract during the procedure and reduce the bloating. If you had a lower endoscopy (such as a colonoscopy or flexible sigmoidoscopy) you may notice spotting of blood in your stool or on the toilet paper. If you underwent a bowel prep for your procedure, then you may not have a normal bowel movement for a few days.  DIET: You may just have clear liquids until 4:30pm.  After that you may have a soft diet.  Tomorrow, you may have a regular diet. Drink plenty of fluids but you should avoid alcoholic beverages for 24 hours.  ACTIVITY: Your care partner should take you home directly after the procedure.  You should plan to take it easy, moving slowly for the rest of the day.  You can resume normal activity the day after the procedure however you should NOT DRIVE or use heavy machinery for 24 hours (because of the sedation medicines used during the test).    SYMPTOMS TO REPORT IMMEDIATELY: A gastroenterologist can be reached at any hour.  During normal business hours, 8:30 AM to 5:00 PM Monday through Friday, call [phone].  After hours and on weekends, please call the GI answering service at [phone] who will take a message and have the physician on call contact you.   Following upper endoscopy (EGD)  Vomiting of blood or coffee ground material  New  chest pain or pain under the shoulder blades  Painful or persistently difficult swallowing  New shortness of breath  Fever of 100F or higher  Black, tarry-looking stools  FOLLOW UP: If any biopsies were taken you will be contacted by phone or by letter within the next 1-3 weeks.  Call your gastroenterologist if you have not heard about the biopsies in 3 weeks.  Our staff will call the home number listed on your records the next business day following your procedure to check on you and address any questions or concerns that you may have at that time regarding the information given to you following your procedure. This is a courtesy call and so if there is no answer at the home number and we have not heard from you through the emergency physician on call, we will assume that you have returned to your regular daily activities without incident.  SIGNATURES/CONFIDENTIALITY: You and/or your care partner have signed paperwork which will be entered into your electronic medical record.  These signatures attest to the fact that that the information above on your After Visit Summary has been reviewed and is understood.  Full responsibility of the confidentiality of this discharge information lies with you and/or your care-partner.  Please, read all of the handouts given to you by your recovery room nurse.  Keep all of your appointments and take your medicine.

## 2014-10-05 ENCOUNTER — Telehealth: Payer: Self-pay

## 2014-10-05 NOTE — Telephone Encounter (Signed)
  Follow up Call-  Call back number 10/02/2014 09/08/2014 04/10/2014  Post procedure Call Back phone  # [phone]  Permission to leave phone message Yes Yes Yes     Patient questions:  Do you have a fever, pain , or abdominal swelling? No. Pain Score  0 *  Have you tolerated food without any problems? Yes.    Have you been able to return to your normal activities? Yes.    Do you have any questions about your discharge instructions: Diet   No. Medications  No. Follow up visit  No.  Do you have questions or concerns about your Care? No.  Actions: * If pain score is 4 or above: No action needed, pain <4.  No problems per the pt. maw

## 2014-10-12 ENCOUNTER — Other Ambulatory Visit: Payer: Self-pay | Admitting: Family Medicine

## 2014-10-20 ENCOUNTER — Ambulatory Visit: Payer: 59 | Admitting: Family Medicine

## 2014-10-21 ENCOUNTER — Encounter: Payer: Self-pay | Admitting: Family Medicine

## 2014-10-21 ENCOUNTER — Ambulatory Visit (INDEPENDENT_AMBULATORY_CARE_PROVIDER_SITE_OTHER): Payer: 59 | Admitting: Family Medicine

## 2014-10-21 VITALS — BP 122/64 | HR 105 | Ht 72.0 in | Wt 184.0 lb

## 2014-10-21 DIAGNOSIS — M9903 Segmental and somatic dysfunction of lumbar region: Secondary | ICD-10-CM

## 2014-10-21 DIAGNOSIS — M546 Pain in thoracic spine: Secondary | ICD-10-CM

## 2014-10-21 DIAGNOSIS — M999 Biomechanical lesion, unspecified: Secondary | ICD-10-CM

## 2014-10-21 DIAGNOSIS — M9901 Segmental and somatic dysfunction of cervical region: Secondary | ICD-10-CM

## 2014-10-21 DIAGNOSIS — M9902 Segmental and somatic dysfunction of thoracic region: Secondary | ICD-10-CM

## 2014-10-21 MED ORDER — TIZANIDINE HCL 4 MG PO TABS: 4.0000 mg | ORAL_TABLET | Freq: Three times a day (TID) | ORAL | Status: DC | PRN

## 2014-10-21 NOTE — Assessment & Plan Note (Signed)
Decision today to treat with OMT was based on Physical Exam  After verbal consent patient was treated with HVLA, ME, FPR techniques in Cervical, thoracic,  lumbar and sacral areas  Patient tolerated the procedure well with improvement in symptoms  Patient given exercises, stretches and lifestyle modifications  See medications in patient instructions if given  Patient will follow up in 2-3 weeks

## 2014-10-21 NOTE — Patient Instructions (Addendum)
Good to see you Ice is your friend Try the new muscle relaxer and see if this helps.  Ice is your friend I want to see you again in 2-3 weeks.

## 2014-10-21 NOTE — Assessment & Plan Note (Signed)
Continues to be a chronic problem. Patient was given a new muscle relaxer today to see if this will be beneficial. Patient warned that this can make him somewhat sleepy. Patient has been on prednisone recently for bronchitis. If patient continues to have difficulty further imaging may be necessary. I'm hoping that this will not be necessary. We'll have patient come back in 2-3 weeks for further evaluation and treatment. Patient did respond fairly well to osteopathic manipulation today.

## 2014-10-21 NOTE — Progress Notes (Signed)
  [name] [place], [place] [address] Phone: [phone] Subjective:     CC: Thoracic back pain, follow up  IWP:[id number] Eric Hood is a 34 y.o. male coming in with complaint of back pain. Patient was seen previously and had more of a muscle imbalance. Patient has been having some increasing pain in his back. Patient has recently had a bronchitis and cough which seems to have exacerbated the back problem. This is causing him to have difficulty at work. Patient also states that he has had to miss work secondary to this. Patient has not been taking any medicine at this time on a regular basis. Patient feels like it does not help. Patient states that only the pain medication he's had previously did help but he did have difficulty with addiction-type activity he states.    Past medical history, social, surgical and family history all reviewed in electronic medical record.   Review of Systems: No headache, visual changes, nausea, vomiting, diarrhea, constipation, dizziness, abdominal pain, skin rash, fevers, chills, night sweats, weight loss, swollen lymph nodes, body aches, joint swelling, muscle aches, chest pain, shortness of breath, mood changes.   Objective Blood pressure 122/64, pulse 105, height 6' (1.829 m), weight 184 lb (83.462 kg), SpO2 97 %.  General: No apparent distress alert and oriented x3 mood and affect normal, dressed appropriately. Patient does appear to be uncomfortable today. HEENT: Pupils equal, extraocular movements intact  Respiratory: Patient's speak in full sentences and does not appear short of breath  Cardiovascular: No lower extremity edema, non tender, no erythema  Skin: Warm dry intact with no signs of infection or rash on extremities or on axial skeleton.  Abdomen: Soft nontender  Neuro: Cranial nerves II through XII are intact, neurovascularly intact in all extremities with 2+ DTRs and 2+ pulses.  Lymph: No  lymphadenopathy of posterior or anterior cervical chain or axillae bilaterally.  Gait normal with good balance and coordination.  MSK:  Non tender with full range of motion and good stability and symmetric strength and tone of shoulders, elbows, wrist, hip, knee and ankles bilaterally.  Back Exam:  Inspection: Mild increase in kyphosis of the upper thoracic spine Motion: Flexion 40 deg, Extension 35 deg, Side Bending to 45 deg bilaterally, mild tightness of the musculature compared to previous exam Rotation to 45 deg bilaterally  SLR laying: Negative  XSLR laying: Negative  Palpable tenderness: None. FABER: negative. Sensory change: Gross sensation intact to all lumbar and sacral dermatomes.  Reflexes: 2+ at both patellar tendons, 2+ at achilles tendons, Babinski's downgoing.  Strength at foot  Plantar-flexion: 5/5 Dorsi-flexion: 5/5 Eversion: 5/5 Inversion: 5/5  Leg strength  Quad: 5/5 Hamstring: 5/5 Hip flexor: 5/5 Hip abductors:4/5  Gait unremarkable. Continues to have scapular dysfunction minimal improvement  OMT Physical Exam  Cervical  C6 flexed rotated and side bent right C2 flexed rotated and side bent left  Thoracic T5 extended rotated inside that right T8 extended rotated and side bent left  Lumbar L2 flexed rotated and side bent right  Sacrum Left on left  Illium Neutral    Impression and Recommendations:     This case required medical decision making of moderate complexity.

## 2014-10-23 ENCOUNTER — Encounter (HOSPITAL_BASED_OUTPATIENT_CLINIC_OR_DEPARTMENT_OTHER): Payer: Self-pay | Admitting: Emergency Medicine

## 2014-10-23 ENCOUNTER — Inpatient Hospital Stay (HOSPITAL_COMMUNITY): Payer: 59

## 2014-10-23 ENCOUNTER — Emergency Department (HOSPITAL_BASED_OUTPATIENT_CLINIC_OR_DEPARTMENT_OTHER): Payer: 59

## 2014-10-23 ENCOUNTER — Inpatient Hospital Stay (HOSPITAL_BASED_OUTPATIENT_CLINIC_OR_DEPARTMENT_OTHER)
Admission: EM | Admit: 2014-10-23 | Discharge: 2014-10-25 | DRG: 812 | Disposition: A | Discharge status: Home / Self Care | Payer: 59 | Attending: Internal Medicine | Admitting: Internal Medicine

## 2014-10-23 DIAGNOSIS — Z9889 Other specified postprocedural states: Secondary | ICD-10-CM

## 2014-10-23 DIAGNOSIS — R1310 Dysphagia, unspecified: Secondary | ICD-10-CM | POA: Diagnosis present

## 2014-10-23 DIAGNOSIS — M546 Pain in thoracic spine: Secondary | ICD-10-CM | POA: Diagnosis present

## 2014-10-23 DIAGNOSIS — K210 Gastro-esophageal reflux disease with esophagitis: Secondary | ICD-10-CM | POA: Diagnosis present

## 2014-10-23 DIAGNOSIS — D500 Iron deficiency anemia secondary to blood loss (chronic): Secondary | ICD-10-CM | POA: Diagnosis present

## 2014-10-23 DIAGNOSIS — K449 Diaphragmatic hernia without obstruction or gangrene: Secondary | ICD-10-CM | POA: Diagnosis present

## 2014-10-23 DIAGNOSIS — K219 Gastro-esophageal reflux disease without esophagitis: Secondary | ICD-10-CM | POA: Diagnosis present

## 2014-10-23 DIAGNOSIS — G8929 Other chronic pain: Secondary | ICD-10-CM | POA: Diagnosis present

## 2014-10-23 DIAGNOSIS — R079 Chest pain, unspecified: Secondary | ICD-10-CM | POA: Diagnosis present

## 2014-10-23 DIAGNOSIS — R05 Cough: Secondary | ICD-10-CM

## 2014-10-23 DIAGNOSIS — K760 Fatty (change of) liver, not elsewhere classified: Secondary | ICD-10-CM | POA: Diagnosis present

## 2014-10-23 DIAGNOSIS — R1011 Right upper quadrant pain: Secondary | ICD-10-CM | POA: Diagnosis present

## 2014-10-23 DIAGNOSIS — R195 Other fecal abnormalities: Secondary | ICD-10-CM | POA: Diagnosis present

## 2014-10-23 DIAGNOSIS — F419 Anxiety disorder, unspecified: Secondary | ICD-10-CM | POA: Diagnosis present

## 2014-10-23 DIAGNOSIS — K222 Esophageal obstruction: Secondary | ICD-10-CM | POA: Diagnosis present

## 2014-10-23 DIAGNOSIS — R0781 Pleurodynia: Secondary | ICD-10-CM

## 2014-10-23 DIAGNOSIS — Z8719 Personal history of other diseases of the digestive system: Secondary | ICD-10-CM

## 2014-10-23 DIAGNOSIS — D649 Anemia, unspecified: Secondary | ICD-10-CM | POA: Diagnosis present

## 2014-10-23 DIAGNOSIS — R059 Cough, unspecified: Secondary | ICD-10-CM

## 2014-10-23 HISTORY — DX: Anemia, unspecified: D64.9

## 2014-10-23 HISTORY — DX: Gastro-esophageal reflux disease without esophagitis: K21.9

## 2014-10-23 HISTORY — DX: Anxiety disorder, unspecified: F41.9

## 2014-10-23 LAB — URINALYSIS, ROUTINE W REFLEX MICROSCOPIC
BILIRUBIN URINE: NEGATIVE
Glucose, UA: NEGATIVE mg/dL
Hgb urine dipstick: NEGATIVE
Ketones, ur: NEGATIVE mg/dL
Leukocytes, UA: NEGATIVE
NITRITE: NEGATIVE
PH: 7 (ref 5.0–8.0)
Protein, ur: NEGATIVE mg/dL
Specific Gravity, Urine: 1.02 (ref 1.005–1.030)
UROBILINOGEN UA: 0.2 mg/dL (ref 0.0–1.0)

## 2014-10-23 LAB — CBC WITH DIFFERENTIAL/PLATELET
BASOS PCT: 0 % (ref 0–1)
Basophils Absolute: 0 10*3/uL (ref 0.0–0.1)
EOS ABS: 0.1 10*3/uL (ref 0.0–0.7)
Eosinophils Relative: 1 % (ref 0–5)
HCT: 24.9 % — ABNORMAL LOW (ref 39.0–52.0)
Hemoglobin: 6.5 g/dL — CL (ref 13.0–17.0)
LYMPHS ABS: 2.2 10*3/uL (ref 0.7–4.0)
Lymphocytes Relative: 20 % (ref 12–46)
MCH: 15.6 pg — AB (ref 26.0–34.0)
MCHC: 26.1 g/dL — ABNORMAL LOW (ref 30.0–36.0)
MCV: 59.6 fL — ABNORMAL LOW (ref 78.0–100.0)
MONO ABS: 0.9 10*3/uL (ref 0.1–1.0)
Monocytes Relative: 8 % (ref 3–12)
Neutro Abs: 7.7 10*3/uL (ref 1.7–7.7)
Neutrophils Relative %: 71 % (ref 43–77)
Platelets: 570 10*3/uL — ABNORMAL HIGH (ref 150–400)
RBC: 4.18 MIL/uL — ABNORMAL LOW (ref 4.22–5.81)
RDW: 23.1 % — ABNORMAL HIGH (ref 11.5–15.5)
WBC: 10.9 10*3/uL — AB (ref 4.0–10.5)

## 2014-10-23 LAB — LIPASE, BLOOD: Lipase: 23 U/L (ref 11–59)

## 2014-10-23 LAB — COMPREHENSIVE METABOLIC PANEL
ALT: 11 U/L (ref 0–53)
AST: 13 U/L (ref 0–37)
Albumin: 3.3 g/dL — ABNORMAL LOW (ref 3.5–5.2)
Alkaline Phosphatase: 73 U/L (ref 39–117)
Anion gap: 6 (ref 5–15)
BUN: 10 mg/dL (ref 6–23)
CHLORIDE: 104 meq/L (ref 96–112)
CO2: 28 mmol/L (ref 19–32)
Calcium: 8.5 mg/dL (ref 8.4–10.5)
Creatinine, Ser: 0.9 mg/dL (ref 0.50–1.35)
GFR calc Af Amer: >90 mL/min (ref >90)
GFR calc non Af Amer: >90 mL/min (ref >90)
Glucose, Bld: 93 mg/dL (ref 70–99)
POTASSIUM: 3.5 mmol/L (ref 3.5–5.1)
Sodium: 138 mmol/L (ref 135–145)
Total Bilirubin: 0.1 mg/dL — ABNORMAL LOW (ref 0.3–1.2)
Total Protein: 5.8 g/dL — ABNORMAL LOW (ref 6.0–8.3)

## 2014-10-23 LAB — RAPID URINE DRUG SCREEN, HOSP PERFORMED
Amphetamines: NOT DETECTED
BARBITURATES: NOT DETECTED
Benzodiazepines: POSITIVE — AB
COCAINE: NOT DETECTED
Opiates: POSITIVE — AB
Tetrahydrocannabinol: NOT DETECTED

## 2014-10-23 LAB — PREPARE RBC (CROSSMATCH)

## 2014-10-23 LAB — CBC
HCT: 23.9 % — ABNORMAL LOW (ref 39.0–52.0)
Hemoglobin: 6.2 g/dL — CL (ref 13.0–17.0)
MCH: 15.4 pg — ABNORMAL LOW (ref 26.0–34.0)
MCHC: 25.9 g/dL — AB (ref 30.0–36.0)
MCV: 59.5 fL — ABNORMAL LOW (ref 78.0–100.0)
PLATELETS: 323 10*3/uL (ref 150–400)
RBC: 4.02 MIL/uL — ABNORMAL LOW (ref 4.22–5.81)
RDW: 23 % — ABNORMAL HIGH (ref 11.5–15.5)
WBC: 11.7 10*3/uL — ABNORMAL HIGH (ref 4.0–10.5)

## 2014-10-23 LAB — OCCULT BLOOD X 1 CARD TO LAB, STOOL: FECAL OCCULT BLD: POSITIVE — AB

## 2014-10-23 LAB — ABO/RH: ABO/RH(D): O POS

## 2014-10-23 MED ORDER — PANTOPRAZOLE SODIUM 40 MG IV SOLR
40.0000 mg | Freq: Once | INTRAVENOUS | Status: AC
  Administered 2014-10-23: 40 mg via INTRAVENOUS
  Filled 2014-10-23: qty 40

## 2014-10-23 MED ORDER — ACETAMINOPHEN 325 MG PO TABS: 650.0000 mg | ORAL_TABLET | Freq: Four times a day (QID) | ORAL | Status: DC | PRN

## 2014-10-23 MED ORDER — GI COCKTAIL ~~LOC~~
ORAL | Status: AC
  Filled 2014-10-23: qty 30

## 2014-10-23 MED ORDER — LORAZEPAM 0.5 MG PO TABS
0.5000 mg | ORAL_TABLET | Freq: Once | ORAL | Status: AC
  Administered 2014-10-23: 0.5 mg via ORAL
  Filled 2014-10-23: qty 1

## 2014-10-23 MED ORDER — ALBUTEROL SULFATE (2.5 MG/3ML) 0.083% IN NEBU: 2.5000 mg | INHALATION_SOLUTION | RESPIRATORY_TRACT | Status: DC | PRN

## 2014-10-23 MED ORDER — GI COCKTAIL ~~LOC~~
30.0000 mL | Freq: Once | ORAL | Status: AC
  Administered 2014-10-23: 30 mL via ORAL

## 2014-10-23 MED ORDER — HYDROMORPHONE HCL 1 MG/ML IJ SOLN
1.0000 mg | Freq: Once | INTRAMUSCULAR | Status: AC
  Administered 2014-10-23: 1 mg via INTRAVENOUS

## 2014-10-23 MED ORDER — SODIUM CHLORIDE 0.9 % IV SOLN
Freq: Once | INTRAVENOUS | Status: AC
  Administered 2014-10-23: 23:00:00 via INTRAVENOUS

## 2014-10-23 MED ORDER — HYDROMORPHONE HCL 1 MG/ML IJ SOLN
1.0000 mg | Freq: Once | INTRAMUSCULAR | Status: AC
  Administered 2014-10-23: 1 mg via INTRAVENOUS
  Filled 2014-10-23: qty 1

## 2014-10-23 MED ORDER — TIZANIDINE HCL 4 MG PO TABS
4.0000 mg | ORAL_TABLET | Freq: Three times a day (TID) | ORAL | Status: DC | PRN
  Filled 2014-10-23: qty 1

## 2014-10-23 MED ORDER — ONDANSETRON HCL 4 MG PO TABS: 4.0000 mg | ORAL_TABLET | Freq: Four times a day (QID) | ORAL | Status: DC | PRN

## 2014-10-23 MED ORDER — ACETAMINOPHEN 650 MG RE SUPP: 650.0000 mg | Freq: Four times a day (QID) | RECTAL | Status: DC | PRN

## 2014-10-23 MED ORDER — ONDANSETRON HCL 4 MG/2ML IJ SOLN
4.0000 mg | Freq: Four times a day (QID) | INTRAMUSCULAR | Status: DC | PRN
  Administered 2014-10-25: 4 mg via INTRAVENOUS
  Filled 2014-10-23: qty 2

## 2014-10-23 MED ORDER — HYDROMORPHONE HCL 1 MG/ML IJ SOLN
INTRAMUSCULAR | Status: AC
  Filled 2014-10-23: qty 1

## 2014-10-23 MED ORDER — PANTOPRAZOLE SODIUM 40 MG PO TBEC
40.0000 mg | DELAYED_RELEASE_TABLET | Freq: Two times a day (BID) | ORAL | Status: DC
  Administered 2014-10-23 – 2014-10-25 (×4): 40 mg via ORAL
  Filled 2014-10-23 (×4): qty 1

## 2014-10-23 MED ORDER — HYDROMORPHONE HCL 1 MG/ML IJ SOLN
1.0000 mg | INTRAMUSCULAR | Status: DC | PRN
  Administered 2014-10-23 – 2014-10-25 (×12): 1 mg via INTRAVENOUS
  Filled 2014-10-23 (×12): qty 1

## 2014-10-23 MED ORDER — IOHEXOL 350 MG/ML SOLN
100.0000 mL | Freq: Once | INTRAVENOUS | Status: AC | PRN
  Administered 2014-10-23: 100 mL via INTRAVENOUS

## 2014-10-23 NOTE — Progress Notes (Addendum)
Report received from Santiago Glad, [place] for admission to [phone] at [facility]

## 2014-10-23 NOTE — ED Notes (Signed)
Critical Hgb 6.5 called by lab tech

## 2014-10-23 NOTE — ED Provider Notes (Addendum)
CSN: [id number]     Arrival date & time 10/23/14  9937 History   First MD Initiated Contact with Patient 10/23/14 1037     Chief Complaint  Patient presents with  . Abdominal Pain     (Consider location/radiation/quality/duration/timing/severity/associated sxs/prior Treatment) Patient is a 34 y.o. male presenting with abdominal pain. The history is provided by the patient.  Abdominal Pain Pain location:  RUQ and epigastric Pain quality: aching   Pain radiates to:  Does not radiate Pain severity:  Moderate Onset quality:  Gradual Timing:  Constant Progression:  Worsening Context: not alcohol use and not sick contacts   Relieved by:  Nothing Worsened by:  Nothing tried Ineffective treatments:  None tried Associated symptoms: no cough, no fever, no shortness of breath and no vomiting     Past Medical History  Diagnosis Date  . Back pain   . Reflux   . Esophageal stricture   . Hiatal hernia   . Ulcerative esophagitis    Past Surgical History  Procedure Laterality Date  . Hand surgery Right    Family History  Problem Relation Age of Onset  . Hypertension Mother   . Hypertension Father   . Colon cancer Neg Hx   . Esophageal cancer Neg Hx   . Rectal cancer Neg Hx   . Stomach cancer Neg Hx    History  Substance Use Topics  . Smoking status: Never Smoker   . Smokeless tobacco: Never Used  . Alcohol Use: No    Review of Systems  Constitutional: Negative for fever.  Respiratory: Negative for cough and shortness of breath.   Gastrointestinal: Positive for abdominal pain. Negative for vomiting.  All other systems reviewed and are negative.     Allergies  Review of patient's allergies indicates no known allergies.  Home Medications   Prior to Admission medications   Medication Sig Start Date End Date Taking? Authorizing Provider  pantoprazole (PROTONIX) 40 MG tablet TAKE 1 TABLET (40 MG TOTAL) BY MOUTH 2 (TWO) TIMES DAILY. 10/12/14  Yes Eulas Post, MD   tiZANidine (ZANAFLEX) 4 MG tablet Take 1 tablet (4 mg total) by mouth every 8 (eight) hours as needed for muscle spasms. 10/21/14  Yes Lyndal Pulley, DO  albuterol (VENTOLIN HFA) 108 (90 BASE) MCG/ACT inhaler Inhale 1-2 puffs into the lungs every 4 (four) hours as needed for wheezing or shortness of breath. 09/29/14   Tammi Sou, MD  cyclobenzaprine (FLEXERIL) 10 MG tablet Take 1 tablet (10 mg total) by mouth 3 (three) times daily as needed for muscle spasms. 07/20/14   Lyndal Pulley, DO  hydrOXYzine (ATARAX/VISTARIL) 25 MG tablet TAKE 1 TABLET BY MOUTH 3 TIMES A DAY AS NEEDED 06/12/14   Lyndal Pulley, DO  predniSONE (DELTASONE) 20 MG tablet 2 tabs po qd x 5d, then 1 tab po qd x 5d 09/29/14   Tammi Sou, MD  traMADol (ULTRAM) 50 MG tablet Take 2 tablets (100 mg total) by mouth at bedtime as needed. 07/20/14   Lyndal Pulley, DO   BP 114/51 mmHg  Pulse 105  Temp(Src) 98.2 F (36.8 C) (Oral)  Resp 20  Ht 6' (1.829 m)  Wt 183 lb (83.008 kg)  BMI 24.81 kg/m2  SpO2 100% Physical Exam  Constitutional: He is oriented to person, place, and time. He appears well-developed and well-nourished. No distress.  HENT:  Head: Normocephalic and atraumatic.  Mouth/Throat: Oropharynx is clear and moist. No oropharyngeal exudate.  Eyes: EOM are  normal. Pupils are equal, round, and reactive to light.  Neck: Normal range of motion. Neck supple.  Cardiovascular: Normal rate and regular rhythm.  Exam reveals no friction rub.   No murmur heard. Pulmonary/Chest: Effort normal and breath sounds normal. No respiratory distress. He has no wheezes. He has no rales.  Abdominal: Soft. He exhibits no distension. There is no tenderness. There is no rebound.  Musculoskeletal: Normal range of motion. He exhibits no edema.  Neurological: He is alert and oriented to person, place, and time. No cranial nerve deficit. He exhibits normal muscle tone. Coordination normal.  Skin: No rash noted. He is not  diaphoretic.  Nursing note and vitals reviewed.   ED Course  Procedures (including critical care time) Labs Review Labs Reviewed  URINALYSIS, ROUTINE W REFLEX MICROSCOPIC  CBC WITH DIFFERENTIAL  COMPREHENSIVE METABOLIC PANEL  LIPASE, BLOOD  URINALYSIS, ROUTINE W REFLEX MICROSCOPIC    Imaging Review Dg Chest 2 View  10/23/2014   CLINICAL DATA:  Right upper quadrant pain, fever.  EXAM: CHEST  2 VIEW  COMPARISON:  None.  FINDINGS: The heart size and mediastinal contours are within normal limits. Both lungs are clear. No pneumothorax or pleural effusion is noted. The visualized skeletal structures are unremarkable.  IMPRESSION: No acute cardiopulmonary abnormality seen.   Electronically Signed   By: Sabino Dick M.D.   On: 10/23/2014 11:35   US Abdomen Complete  10/23/2014   CLINICAL DATA:  34 year old male with severe right upper quadrant pain today. Recent esophageal dilatation 2 weeks ago. Initial encounter.  EXAM: ULTRASOUND ABDOMEN COMPLETE  COMPARISON:  Chest radiographs 1110 hr today.  FINDINGS: Gallbladder: Contracted, with adjacent bowel gas suspected. "Wall echo shadow sign" felt less likely. No sonographic Murphy sign elicited. No pericholecystic fluid.  Common bile duct: Diameter: Not visualized due to overlying bowel gas.  Liver: Mildly echogenic (image 23). No intrahepatic biliary ductal dilatation is evident. No discrete liver lesion is identified.  IVC: Incompletely visualized due to overlying bowel gas, visualized portions within normal limits.  Pancreas: Incompletely visualized due to overlying bowel gas, visualized portions within normal limits.  Spleen: Size and appearance within normal limits.  Right Kidney: Length: 12.1 cm. Echogenicity within normal limits. No mass or hydronephrosis visualized.  Left Kidney: Length: 12.2 cm. Echogenicity within normal limits. No mass or hydronephrosis visualized.  Abdominal aorta: Incompletely visualized due to overlying bowel gas, visualized  portions within normal limits.  Other findings: None.  IMPRESSION: 1. Hepatic steatosis. CBD not identified, but no intrahepatic biliary ductal dilatation to suggest biliary obstruction. 2. Contracted gallbladder such that chololithiasis is difficult to exclude. No strong evidence of acute cholecystitis.   Electronically Signed   By: Lars Pinks M.D.   On: 10/23/2014 12:05     EKG Interpretation None      MDM   Final diagnoses:  RUQ pain  Anemia, unspecified anemia type    34M with acute onset RUQ pain. Began this morning. No nausea, vomiting. No fevers. No trauma. Went to bed feeling well. RUQ pain on abdominal exam. No other abdominal tenderness. Will obtain CXR and RUQ [place]. RUQ [place] normal. CBC shows low Hgb, hemoccult normal. Hx of gastric ulcers. Patient has hx of ulcers. I spoke with PA at [facility], they will consult on patient. Admitted to telemetry at [facility].  Evelina Bucy, MD 10/23/14 1524  Evelina Bucy, MD 10/23/14 [phone]

## 2014-10-23 NOTE — Consult Note (Signed)
[facility] Consult: 4:43 PM 10/23/2014  LOS: 0 days    Referring Provider: Dr. Conley Canal Primary Care Physician:  Eulas Post, MD Primary Gastroenterologist:  Dr. Fuller Plan    Reason for Consultation:  Microcytic anemia. FOBT positive   HPI: Eric Hood is a 34 y.o. male.  Patient has history of dysphagia and severe esophagitis associated with esophageal strictures. He underwent upper endoscopy with dilation of esophageal strictures in June, November and December 2015.  He is not having any dysphagia but does say that when he swallows he feels a prickly feeling in his lower esophagus  Patient has never undergone colonoscopy.  Patient had about 3 days left of a tapering course of prednisone that was prescribed about a week ago for bronchitis. He was not treated with antibiotics. He has been coughing vigorously and has developed some right upper quadrant pain which brought him to the emergency room.  Patient also gives history of at least one year of fatigue with exertion which has progressed over several months. Even lifting boxes or walking up stairs makes him tired and dyspneic. He also has palpitations but no chest pain with exertion.  There is no baseline hemoglobin in Epic and patient confirms that he is never had blood drawn at the MDs office. Currently his hemoglobin is between 6.5 and 6.2. His MCV is 59.  Stool is testing FOBT positive. Patient's complaint of right chest pain/right upper quadrant pain was evaluated with abdominal ultrasound today.  It shows hepatic steatosis. The gallbladder is contracted such the cholelithiasis is difficult to exclude. Common bile duct is not identified but the intrahepatic ducts are not dilated.  LFTs are normal.  Patient has brown stools without blood, daily. There  is no nausea. His appetite is good. His weight is stable. He hasn't had any nosebleeds or bleeding from parts of his body that seems unusual nor has he had any unusual bruising. He is not aware of ever having had anemia before but then again hasn't had any blood draws. Normally the patient does not take any NSAIDs or aspirin products. However week ago he did take a single dose of Goody's powders.    Past Medical History  Diagnosis Date  . Back pain   . Reflux   . Esophageal stricture   . Hiatal hernia   . Ulcerative esophagitis   . Anxiety   . GERD (gastroesophageal reflux disease)   . Anemia 10/23/2014    Past Surgical History  Procedure Laterality Date  . Hand surgery Right   . Esophageal dilation      x3    Prior to Admission medications   Medication Sig Start Date End Date Taking? Authorizing Provider  pantoprazole (PROTONIX) 40 MG tablet TAKE 1 TABLET (40 MG TOTAL) BY MOUTH 2 (TWO) TIMES DAILY. 10/12/14  Yes Eulas Post, MD  tiZANidine (ZANAFLEX) 4 MG tablet Take 1 tablet (4 mg total) by mouth every 8 (eight) hours as needed for muscle spasms. 10/21/14  Yes Lyndal Pulley, DO  albuterol (VENTOLIN HFA) 108 (90 BASE) MCG/ACT  inhaler Inhale 1-2 puffs into the lungs every 4 (four) hours as needed for wheezing or shortness of breath. 09/29/14   Tammi Sou, MD  cyclobenzaprine (FLEXERIL) 10 MG tablet Take 1 tablet (10 mg total) by mouth 3 (three) times daily as needed for muscle spasms. 07/20/14   Lyndal Pulley, DO  hydrOXYzine (ATARAX/VISTARIL) 25 MG tablet TAKE 1 TABLET BY MOUTH 3 TIMES A DAY AS NEEDED 06/12/14   Lyndal Pulley, DO  predniSONE (DELTASONE) 20 MG tablet 2 tabs po qd x 5d, then 1 tab po qd x 5d 09/29/14   Tammi Sou, MD  traMADol (ULTRAM) 50 MG tablet Take 2 tablets (100 mg total) by mouth at bedtime as needed. 07/20/14   Lyndal Pulley, DO    Scheduled Meds:  Infusions:  PRN Meds:      Allergies as of 10/23/2014  . (No Known Allergies)     Family History  Problem Relation Age of Onset  . Hypertension Mother   . Hypertension Father   . Colon cancer Neg Hx   . Esophageal cancer Neg Hx   . Rectal cancer Neg Hx   . Stomach cancer Neg Hx     History   Social History  . Marital Status: Single    Spouse Name: N/A    Number of Children: N/A  . Years of Education: N/A   Occupational History  . Not on file.   Social History Main Topics  . Smoking status: Never Smoker   . Smokeless tobacco: Never Used  . Alcohol Use: No  . Drug Use: No     Comment: opiate abuse  . Sexual Activity: No   Other Topics Concern  . Not on file   Social History Narrative    REVIEW OF SYSTEMS: Constitutional:  Per history of present illness ENT:  No nose bleeds Pulm:  No cough currently. No pleuritic pain. CV:  No palpitations, no LE edema.  GU:  No hematuria, no frequency GI:  Per history of present illness. Heme:  No known history anemia.   Transfusions:  None ever. Neuro:  No headaches, no peripheral tingling or numbness Derm:  No itching, no rash or sores.  Endocrine:  No sweats or chills.  No polyuria or dysuria Immunization:  No record of having had flu or Pneumovax vaccine. Travel:  None beyond local counties in last few months.    PHYSICAL EXAM: Vital signs in last 24 hours: Filed Vitals:   10/23/14 1617  BP: 123/66  Pulse: 86  Temp: 98.2 F (36.8 C)  Resp: 16   Wt Readings from Last 3 Encounters:  10/23/14 177 lb (80.287 kg)  10/21/14 184 lb (83.462 kg)  10/02/14 180 lb (81.647 kg)    General: Pale, somewhat unwell-appearing young man. He is comfortable Head:  No facial asymmetry or swelling.  Eyes:  No scleral icterus. Conjunctiva somewhat pale Ears:  No hearing deficit  Nose:  No congestion or discharge Mouth:  Clear, moist, good dentition Neck:  No TMG, masses or JVD. Lungs:  No dyspnea or cough. Lungs CTA bilaterally Heart: RRR. No MRG. S1/S2 audible Abdomen:  Soft, NT, ND. No HSM. No bruits.  No mass.   Rectal: Did not perform. Stool did test heme positive but was brown earlier today.   Musc/Skeltl: No joint swelling, deformity or redness Extremities:  No pedal edema  Neurologic:  Oriented 3. Good historian. No tremor. No gross deficits. Skin:  Pale. No telangiectasia. Tattoos:  None Nodes:  No cervical adenopathy   Psych:  Affect blunted.  Relaxed, not anxious.  Intake/Output from previous day:   Intake/Output this shift:    LAB RESULTS:  Recent Labs  10/23/14 0945 10/23/14 1320  WBC 10.9* 11.7*  HGB 6.5* 6.2*  HCT 24.9* 23.9*  PLT 570* 323   BMET Lab Results  Component Value Date   NA 138 10/23/2014   K 3.5 10/23/2014   CL 104 10/23/2014   CO2 28 10/23/2014   GLUCOSE 93 10/23/2014   BUN 10 10/23/2014   CREATININE 0.90 10/23/2014   CALCIUM 8.5 10/23/2014   LFT  Recent Labs  10/23/14 0945  PROT 5.8*  ALBUMIN 3.3*  AST 13  ALT 11  ALKPHOS 73  BILITOT 0.1*   Lipase     Component Value Date/Time   LIPASE 23 10/23/2014 0945    Drugs of Abuse  No results found for: LABOPIA, COCAINSCRNUR, LABBENZ, AMPHETMU, THCU, LABBARB   RADIOLOGY STUDIES: Dg Chest 2 View  10/23/2014   CLINICAL DATA:  Right upper quadrant pain, fever.  EXAM: CHEST  2 VIEW  COMPARISON:  None.  FINDINGS: The heart size and mediastinal contours are within normal limits. Both lungs are clear. No pneumothorax or pleural effusion is noted. The visualized skeletal structures are unremarkable.  IMPRESSION: No acute cardiopulmonary abnormality seen.   Electronically Signed   By: Sabino Dick M.D.   On: 10/23/2014 11:35   US Abdomen Complete  10/23/2014   CLINICAL DATA:  34 year old male with severe right upper quadrant pain today. Recent esophageal dilatation 2 weeks ago. Initial encounter.  EXAM: ULTRASOUND ABDOMEN COMPLETE  COMPARISON:  Chest radiographs 1110 hr today.  FINDINGS: Gallbladder: Contracted, with adjacent bowel gas suspected. "Wall echo shadow sign" felt less likely. No  sonographic Murphy sign elicited. No pericholecystic fluid.  Common bile duct: Diameter: Not visualized due to overlying bowel gas.  Liver: Mildly echogenic (image 23). No intrahepatic biliary ductal dilatation is evident. No discrete liver lesion is identified.  IVC: Incompletely visualized due to overlying bowel gas, visualized portions within normal limits.  Pancreas: Incompletely visualized due to overlying bowel gas, visualized portions within normal limits.  Spleen: Size and appearance within normal limits.  Right Kidney: Length: 12.1 cm. Echogenicity within normal limits. No mass or hydronephrosis visualized.  Left Kidney: Length: 12.2 cm. Echogenicity within normal limits. No mass or hydronephrosis visualized.  Abdominal aorta: Incompletely visualized due to overlying bowel gas, visualized portions within normal limits.  Other findings: None.  IMPRESSION: 1. Hepatic steatosis. CBD not identified, but no intrahepatic biliary ductal dilatation to suggest biliary obstruction. 2. Contracted gallbladder such that chololithiasis is difficult to exclude. No strong evidence of acute cholecystitis.   Electronically Signed   By: Lars Pinks M.D.   On: 10/23/2014 12:05    ENDOSCOPIC STUDIES: 10/02/14 EGD with Savary dilatation Indication: Dysphagia, history GERD, history dilation of esophageal stricture ENDOSCOPIC IMPRESSION: 1. Esopahageal stricture; dilated using savary dilators over guidewire 2. LA Class C esophagitis noted 3. 5 cm hiatal hernia RECOMMENDATIONS: 1. Anti-reflux regimen long term 2. Continue PPI bid long term 3. Post dilation instructions 4. Repeat endoscopy with dilation in 4-6 weeks  09/08/14 EGD with balloon dilatation ENDOSCOPIC IMPRESSION: 1. Mid esophageal stricture; multiple biopsies performed; TTS-balloon dilation 2. LA Class D esophagitis; multiple biopsies performed 3. 5 cm hiatal hernia  RECOMMENDATIONS: 1. Anti-reflux regimen long term 2. Await  pathology results 3. Continue PPI bid long term 4. Post dilation instructions 5. Endoscopy with dilation in 1 month  04/10/2014 EGD with balloon dilation ENDOSCOPIC IMPRESSION: 1. Stricture in the middle third of the esophagus; multiple biopsies; dilation performed 2. Abnormal mucosa in the lower third of the esophagus; multiple biopsies 3. LA Class D esophagitis 4. 4 cm hiatal hernia  RECOMMENDATIONS: 1. Anti-reflux regimen long term 2. Await pathology results 3. post dilation instructions and a soft diet 4. OP follow-up in 3-4 weeks with me or one of our APPs. Likely will need repeat dilation as the inflammation improves. 5. PPI bid: pantorpazole 40 mg po bid   IMPRESSION:   *  Microcytic anemia. No reference hemoglobin for comparison.  Given the patient's long history of fatigue I suspect that this anemia has been brewing for many months if not longer.  *  FOBT positive without overt bleeding, melena etc. he may still be having blood loss from his severe esophagitis but I'm not sure that that would account for the level of his anemia  *  Severe ulcerative esophagitis associated with esophageal strictures requiring dilatation on 3 occasions within the past 7 months.  *  Hepatic steatosis seen on ultrasound today.   PLAN:     *  Will discuss with Dr. Fuller Plan but suspect the patient may require a colonoscopy. It is possible that he could be sent home after transfusions and return as an outpatient for the colonoscopy. Patient agreeable to this plan if that is what is decided.   Azucena Freed  10/23/2014, 4:43 PM Pager: [phone]      Attending physician's note   I have taken a history, examined the patient and reviewed the chart. I agree with the Advanced Practitioner's note, impression and recommendations.  Strongly suspect chronic blood loss from ulcerative esophagitis leading to Fe deficiency and heme + stool. He also has a refractory esophageal  stricture and has undergone several dilations over the past year. Continue PPI BID for long term GERD mgmt.  We will need to definitively exclude colonic sources of blood loss with a colonoscopy as an outpatient.  Transfuse to Hb >7-8. Check Fe/TIBC/ferritin. If Fe deficiency is confirmed I recommend IV Fe replacement in hospital as Fe pills could easily exacerbate his ulcerative esophagitis and his refractory esophageal stricture.  Right chest pain/RUQ pain is not GI related and is likely musculoskeletal. Mgmt per primary service. Outpatient GI follow with me in 3-4 weeks. GI signing off.   Ladene Artist, MD [facility]

## 2014-10-23 NOTE — Progress Notes (Signed)
Eric Hood [id number] Code Status: none on file Admission Data: 10/23/2014 4:21 PM Attending Provider:  Mingo Amber (made aware of patients arrival to unit & complaints of RUQ pain) LKG:[id number],GUYQI W, MD Consults/ Treatment Team:    Eric Hood is a 34 y.o. male patient admitted from ED awake, alert - oriented  X 3 - no acute distress noted.  VSS - Blood pressure 123/66, pulse 86, temperature 98.2 F (36.8 C), temperature source Oral, resp. rate 16, height 6' (1.829 m), weight 80.287 kg (177 lb), SpO2 97 %.    IV in place, occlusive dsg intact without redness.  Orientation to room, and floor completed with information packet given to patient/family.  Patient declined safety video at this time.  Admission INP armband ID verified with patient/family, and in place.   SR up x 2, fall assessment complete, with patient and family able to verbalize understanding of risk associated with falls, and verbalized understanding to call nsg before up out of bed.  Call light within reach, patient able to voice, and demonstrate understanding.  Skin, clean-dry- intact without evidence of bruising, or skin tears.   No evidence of skin break down noted on exam.     Will cont to eval and treat per MD orders.  Delman Cheadle, RN 10/23/2014 4:21 PM

## 2014-10-23 NOTE — ED Notes (Signed)
EDP Eric Hood notified pt c/o pain cont'd-no orders received

## 2014-10-23 NOTE — Progress Notes (Signed)
Dr. Conley Canal made aware of patient's arrival to unit, hgb 6.2, and complaints of RUQ pain. Patient's vital signs stable.

## 2014-10-23 NOTE — Progress Notes (Signed)
Dr. Conley Canal verbal order to contact GI for consult. [place], PA made aware.

## 2014-10-23 NOTE — H&P (Signed)
[facility] History and Physical  Abhinav Mayorquin MCN:[id number] DOB: 07-11-1981 DOA: 10/23/2014  Referring physician: EDP PCP: Eulas Post, MD   Chief Complaint: right sided pain  HPI: Eric Hood is a 34 y.o. male  With h/o GERD, esophagitis, stricture with recent dilitation by Dr. Fuller Plan, presents to [facility] with RUQ/chest pain. Sharp, pleuritic.  Recently treated for bronchitis with prednisone taper, albuterol, no antibiotics.  In ED, found to have a hgb of 6, heme positive stool. Denies n/v, melena or hematochesia.  RUQ ultrasound unremarkable.  LFTs, lipase normal.  EDP consulted GI who will consult.  Never had colonoscopy.     Review of Systems:  Systems reviewed. As above, otherwise negative   Past Medical History  Diagnosis Date  . Back pain   . Reflux   . Esophageal stricture 03/2014  . Hiatal hernia   . Ulcerative esophagitis 03/2014    severe  . Anxiety   . GERD (gastroesophageal reflux disease)   . Anemia 10/23/2014   Past Surgical History  Procedure Laterality Date  . Hand surgery Right   . Esophageal dilation      On 3 occasions: 03/2014, 08/2014, 09/2014   Social History:  reports that he has never smoked. He has never used smokeless tobacco. He reports that he does not drink alcohol or use illicit drugs.  No Known Allergies  Family History  Problem Relation Age of Onset  . Hypertension Mother   . Hypertension Father   . Colon cancer Neg Hx   . Esophageal cancer Neg Hx   . Rectal cancer Neg Hx   . Stomach cancer Neg Hx      Prior to Admission medications   Medication Sig Start Date End Date Taking? Authorizing Provider  pantoprazole (PROTONIX) 40 MG tablet TAKE 1 TABLET (40 MG TOTAL) BY MOUTH 2 (TWO) TIMES DAILY. 10/12/14  Yes Eulas Post, MD  tiZANidine (ZANAFLEX) 4 MG tablet Take 1 tablet (4 mg total) by mouth every 8 (eight) hours as needed for muscle spasms. 10/21/14  Yes Lyndal Pulley, DO   Physical Exam: Filed Vitals:   10/23/14 1309 10/23/14 1400 10/23/14 1500 10/23/14 1617  BP: 129/60 102/52 103/54 123/66  Pulse: 114 88 87 86  Temp:    98.2 F (36.8 C)  TempSrc:    Oral  Resp:    16  Height:    6' (1.829 m)  Weight:    80.287 kg (177 lb)  SpO2: 98% 100% 99% 97%    Wt Readings from Last 3 Encounters:  10/23/14 80.287 kg (177 lb)  10/21/14 83.462 kg (184 lb)  10/02/14 81.647 kg (180 lb)  BP 123/66 mmHg  Pulse 86  Temp(Src) 98.2 F (36.8 C) (Oral)  Resp 16  Ht 6' (1.829 m)  Wt 80.287 kg (177 lb)  BMI 24.00 kg/m2  SpO2 97%  General Appearance:    Pale, uncomfortable, diaphoretic.   Head:    Normocephalic, without obvious abnormality, atraumatic  Eyes:    PERRL, pale conjunctivs       Nose:   Nares normal, septum midline, mucosa normal, no drainage   or sinus tenderness  Throat:   Lips, mucosa, and tongue normal; teeth and gums normal  Neck:   Supple, symmetrical, trachea midline, no adenopathy;       thyroid:  No enlargement/tenderness/nodules; no carotid   bruit or JVD  Back:     Symmetric, no curvature, ROM normal, no CVA tenderness  Lungs:  Clear to auscultation bilaterally, respirations unlabored  Chest wall:    No tenderness or deformity  Heart:    Regular rate and rhythm, S1 and S2 normal, no murmur, rub   or gallop  Abdomen:     Soft, non-tender, bowel sounds active all four quadrants,    no masses, no organomegaly  Genitalia:    deferred  Rectal:    Per EDP heme positive  Extremities:   Extremities normal, atraumatic, no cyanosis or edema  Pulses:   2+ and symmetric all extremities  Skin:   Skin color, texture, turgor normal, no rashes or lesions  Lymph nodes:   Cervical, supraclavicular, and axillary nodes normal  Neurologic:   CNII-XII intact. Normal strength, sensation and reflexes      throughout    Psych: normal affect        Labs on Admission:  Basic Metabolic Panel:  Recent Labs Lab 10/23/14 0945  NA 138  K 3.5  CL 104  CO2 28  GLUCOSE 93  BUN 10    CREATININE 0.90  CALCIUM 8.5   Liver Function Tests:  Recent Labs Lab 10/23/14 0945  AST 13  ALT 11  ALKPHOS 73  BILITOT 0.1*  PROT 5.8*  ALBUMIN 3.3*    Recent Labs Lab 10/23/14 0945  LIPASE 23   No results for input(s): AMMONIA in the last 168 hours. CBC:  Recent Labs Lab 10/23/14 0945 10/23/14 1320  WBC 10.9* 11.7*  NEUTROABS 7.7  --   HGB 6.5* 6.2*  HCT 24.9* 23.9*  MCV 59.6* 59.5*  PLT 570* 323   Cardiac Enzymes: No results for input(s): CKTOTAL, CKMB, CKMBINDEX, TROPONINI in the last 168 hours.  BNP (last 3 results) No results for input(s): PROBNP in the last 8760 hours. CBG: No results for input(s): GLUCAP in the last 168 hours.  Radiological Exams on Admission: Dg Chest 2 View  10/23/2014   CLINICAL DATA:  Right upper quadrant pain, fever.  EXAM: CHEST  2 VIEW  COMPARISON:  None.  FINDINGS: The heart size and mediastinal contours are within normal limits. Both lungs are clear. No pneumothorax or pleural effusion is noted. The visualized skeletal structures are unremarkable.  IMPRESSION: No acute cardiopulmonary abnormality seen.   Electronically Signed   By: Sabino Dick M.D.   On: 10/23/2014 11:35   US Abdomen Complete  10/23/2014   CLINICAL DATA:  34 year old male with severe right upper quadrant pain today. Recent esophageal dilatation 2 weeks ago. Initial encounter.  EXAM: ULTRASOUND ABDOMEN COMPLETE  COMPARISON:  Chest radiographs 1110 hr today.  FINDINGS: Gallbladder: Contracted, with adjacent bowel gas suspected. "Wall echo shadow sign" felt less likely. No sonographic Murphy sign elicited. No pericholecystic fluid.  Common bile duct: Diameter: Not visualized due to overlying bowel gas.  Liver: Mildly echogenic (image 23). No intrahepatic biliary ductal dilatation is evident. No discrete liver lesion is identified.  IVC: Incompletely visualized due to overlying bowel gas, visualized portions within normal limits.  Pancreas: Incompletely visualized due to  overlying bowel gas, visualized portions within normal limits.  Spleen: Size and appearance within normal limits.  Right Kidney: Length: 12.1 cm. Echogenicity within normal limits. No mass or hydronephrosis visualized.  Left Kidney: Length: 12.2 cm. Echogenicity within normal limits. No mass or hydronephrosis visualized.  Abdominal aorta: Incompletely visualized due to overlying bowel gas, visualized portions within normal limits.  Other findings: None.  IMPRESSION: 1. Hepatic steatosis. CBD not identified, but no intrahepatic biliary ductal dilatation to suggest biliary obstruction. 2.  Contracted gallbladder such that chololithiasis is difficult to exclude. No strong evidence of acute cholecystitis.   Electronically Signed   By: Lars Pinks M.D.   On: 10/23/2014 12:05    Assessment/Plan  Principal Problem:   Anemia with heme positive stool: transfuse 1 unit prbc. Gi evaluating. Continue PPI Active Problems:   Right-sided chest/RUQ pain: with cough could be pneumonia. Consider PE. RUQ [place] negative.  Will get CT angio chest.   Thoracic back pain, chronic   GERD (gastroesophageal reflux disease)   History of esophagitis and stricture, recent dilitation.   Time spent: 50 minutes  [facility]

## 2014-10-23 NOTE — ED Notes (Signed)
EDP Eric Hood was notified of repeat Hgb 6.2

## 2014-10-23 NOTE — Progress Notes (Addendum)
Patient given prn 1mg  dilaudid and stated " it did not help my pain at all". Pt asking for an increase in the dose. Pt then stated he has taken 4mg  dilaudid in the past, and that he does not take anything currently for this at home. Dr. Conley Canal made aware. Stated to keep prn medication the same order for now.

## 2014-10-23 NOTE — ED Notes (Signed)
Pt states he is having RUQ abdominal pain since this am.  No N/V/D.  No fever.  Pt had endoscopy last month which showed hiatal hernia and he had his esophagus stretched.

## 2014-10-24 LAB — IRON AND TIBC
Iron: 10 ug/dL — ABNORMAL LOW (ref 42–165)
Saturation Ratios: 3 % — ABNORMAL LOW (ref 20–55)
TIBC: 356 ug/dL (ref 215–435)
UIBC: 346 ug/dL (ref 125–400)

## 2014-10-24 LAB — CBC
HEMATOCRIT: 27 % — AB (ref 39.0–52.0)
HEMATOCRIT: 32.2 % — AB (ref 39.0–52.0)
HEMOGLOBIN: 8.6 g/dL — AB (ref 13.0–17.0)
Hemoglobin: 7.4 g/dL — ABNORMAL LOW (ref 13.0–17.0)
MCH: 16.9 pg — AB (ref 26.0–34.0)
MCH: 16.1 pg — AB (ref 26.0–34.0)
MCHC: 27.4 g/dL — ABNORMAL LOW (ref 30.0–36.0)
MCHC: 26.7 g/dL — ABNORMAL LOW (ref 30.0–36.0)
MCV: 61.5 fL — ABNORMAL LOW (ref 78.0–100.0)
MCV: 60.4 fL — ABNORMAL LOW (ref 78.0–100.0)
PLATELETS: 532 10*3/uL — AB (ref 150–400)
Platelets: 433 10*3/uL — ABNORMAL HIGH (ref 150–400)
RBC: 4.39 MIL/uL (ref 4.22–5.81)
RBC: 5.33 MIL/uL (ref 4.22–5.81)
RDW: 22.8 % — AB (ref 11.5–15.5)
RDW: 22.9 % — ABNORMAL HIGH (ref 11.5–15.5)
WBC: 7.1 10*3/uL (ref 4.0–10.5)
WBC: 7 10*3/uL (ref 4.0–10.5)

## 2014-10-24 LAB — RETICULOCYTES
RBC.: 5.14 MIL/uL (ref 4.22–5.81)
RETIC COUNT ABSOLUTE: 72 10*3/uL (ref 19.0–186.0)
Retic Ct Pct: 1.4 % (ref 0.4–3.1)

## 2014-10-24 LAB — TSH: TSH: 0.646 u[IU]/mL (ref 0.350–4.500)

## 2014-10-24 LAB — SAVE SMEAR

## 2014-10-24 LAB — TYPE AND SCREEN
ABO/RH(D): O POS
Antibody Screen: NEGATIVE
UNIT DIVISION: 0

## 2014-10-24 LAB — FERRITIN: Ferritin: 2 ng/mL — ABNORMAL LOW (ref 22–322)

## 2014-10-24 MED ORDER — CLONAZEPAM 0.5 MG PO TABS
0.2500 mg | ORAL_TABLET | Freq: Three times a day (TID) | ORAL | Status: DC | PRN
  Administered 2014-10-24 – 2014-10-25 (×4): 0.25 mg via ORAL
  Filled 2014-10-24 (×4): qty 1

## 2014-10-24 NOTE — Progress Notes (Signed)
PROGRESS NOTE  Eric Hood TFT:[id number] DOB: Oct 19, 1980 DOA: 10/23/2014 PCP: Eulas Post, MD  HPI/Recap of past 24 hours: C/o RUQ pain, anxious  Assessment/Plan: Principal Problem:   Anemia Active Problems:   Thoracic back pain   Heme positive stool   GERD (gastroesophageal reflux disease)   Right-sided chest/RUQ pain   History of esophagitis and stricture  micorcytic anemia, with h/o ulcerative esophagitis, stool occult blood positive. Labs showed iron deficiency. No hemolysis. Retic/b12/folate/tsh pending.  S/p prbc transfusion. Gi consulted.  RUQ pain, no apparent etiology. CTA chest/cxr/ab [place] no acute findings. Prn pain meds/anxiolytics  Code Status: full  Family Communication: patient  Disposition Plan: inpatient   Consultants:  GI  Procedures:  none  Antibiotics:  none   Objective: BP 102/67 mmHg  Pulse 80  Temp(Src) 98.4 F (36.9 C) (Oral)  Resp 18  Ht 6' (1.829 m)  Wt 80.287 kg (177 lb)  BMI 24.00 kg/m2  SpO2 99%  Intake/Output Summary (Last 24 hours) at 10/24/14 1634 Last data filed at 10/24/14 0025  Gross per 24 hour  Intake    815 ml  Output      0 ml  Net    815 ml   Filed Weights   10/23/14 0924 10/23/14 1617  Weight: 83.008 kg (183 lb) 80.287 kg (177 lb)    Exam:   General:  pale  Cardiovascular: RRR  Respiratory: CTABL  Abdomen: soft/NT/ND/positive bs  Musculoskeletal: unremarkable   Data Reviewed: Basic Metabolic Panel:  Recent Labs Lab 10/23/14 0945  NA 138  K 3.5  CL 104  CO2 28  GLUCOSE 93  BUN 10  CREATININE 0.90  CALCIUM 8.5   Liver Function Tests:  Recent Labs Lab 10/23/14 0945  AST 13  ALT 11  ALKPHOS 73  BILITOT 0.1*  PROT 5.8*  ALBUMIN 3.3*    Recent Labs Lab 10/23/14 0945  LIPASE 23   No results for input(s): AMMONIA in the last 168 hours. CBC:  Recent Labs Lab 10/23/14 0945 10/23/14 1320 10/24/14 0450 10/24/14 1340  WBC 10.9* 11.7* 7.1 7.0  NEUTROABS 7.7  --    --   --   HGB 6.5* 6.2* 7.4* 8.6*  HCT 24.9* 23.9* 27.0* 32.2*  MCV 59.6* 59.5* 61.5* 60.4*  PLT 570* 323 433* 532*   Cardiac Enzymes:   No results for input(s): CKTOTAL, CKMB, CKMBINDEX, TROPONINI in the last 168 hours. BNP (last 3 results) No results for input(s): PROBNP in the last 8760 hours. CBG: No results for input(s): GLUCAP in the last 168 hours.  No results found for this or any previous visit (from the past 240 hour(s)).   Studies: Dg Chest 2 View  10/23/2014   CLINICAL DATA:  Right upper quadrant pain, fever.  EXAM: CHEST  2 VIEW  COMPARISON:  None.  FINDINGS: The heart size and mediastinal contours are within normal limits. Both lungs are clear. No pneumothorax or pleural effusion is noted. The visualized skeletal structures are unremarkable.  IMPRESSION: No acute cardiopulmonary abnormality seen.   Electronically Signed   By: Sabino Dick M.D.   On: 10/23/2014 11:35   Ct Angio Chest Pe W/cm &/or Wo Cm  10/23/2014   CLINICAL DATA:  34 year old male complaining of history of sharp right lower chest and upper abdominal pain since this morning.  EXAM: CT ANGIOGRAPHY CHEST WITH CONTRAST  TECHNIQUE: Multidetector CT imaging of the chest was performed using the standard protocol during bolus administration of intravenous contrast. Multiplanar CT image reconstructions  and MIPs were obtained to evaluate the vascular anatomy.  CONTRAST:  14mL OMNIPAQUE IOHEXOL 350 MG/ML SOLN  COMPARISON:  No priors.  FINDINGS: Mediastinum/Lymph Nodes: No filling defects within the pulmonary arterial tree to suggest underlying pulmonary embolism. Heart size is normal. There is no significant pericardial fluid, thickening or pericardial calcification. No pathologically enlarged mediastinal, hilar or axillary lymph nodes. Multiple borderline enlarged mediastinal lymph nodes measuring up to 9 mm are noted, but are nonspecific. Small hiatal hernia.  Lungs/Pleura: No suspicious appearing pulmonary nodules or  masses. No acute consolidative airspace disease. No pleural effusions.  Musculoskeletal/Soft Tissues: There are no aggressive appearing lytic or blastic lesions noted in the visualized portions of the skeleton.  Upper Abdomen: Ill-defined intermediate attenuation lesion in the right lobe of the liver, predominantly in segment 8 is of uncertain etiology and significance, but appears to have an unusual connection to balls of the right portal vein and the middle hepatic vein, potentially a intrahepatic portal venous shunt. The spleen is incompletely imaged, but appears enlarged measuring up to 14.1 x 5.1 cm on axial images.  Review of the MIP images confirms the above findings.  IMPRESSION: 1. No evidence of pulmonary embolism. 2. No acute findings in the thorax to account for the patient's symptoms. 3. Incidental imaging of the upper abdomen demonstrates what appears to be an intrahepatic portal venous shunt (reference: http://marquez.com/). This is a benign finding, but can be associated with encephalopathy in some individuals. This finding should be further evaluated and confirmed with followup nonemergent MRI of the abdomen with and without IV gadolinium in the near future. 4. There is also apparent splenomegaly.   Electronically Signed   By: Vinnie Langton M.D.   On: 10/23/2014 18:20   US Abdomen Complete  10/23/2014   CLINICAL DATA:  34 year old male with severe right upper quadrant pain today. Recent esophageal dilatation 2 weeks ago. Initial encounter.  EXAM: ULTRASOUND ABDOMEN COMPLETE  COMPARISON:  Chest radiographs 1110 hr today.  FINDINGS: Gallbladder: Contracted, with adjacent bowel gas suspected. "Wall echo shadow sign" felt less likely. No sonographic Murphy sign elicited. No pericholecystic fluid.  Common bile duct: Diameter: Not visualized due to overlying bowel gas.  Liver: Mildly echogenic (image 23). No intrahepatic biliary ductal dilatation is evident. No  discrete liver lesion is identified.  IVC: Incompletely visualized due to overlying bowel gas, visualized portions within normal limits.  Pancreas: Incompletely visualized due to overlying bowel gas, visualized portions within normal limits.  Spleen: Size and appearance within normal limits.  Right Kidney: Length: 12.1 cm. Echogenicity within normal limits. No mass or hydronephrosis visualized.  Left Kidney: Length: 12.2 cm. Echogenicity within normal limits. No mass or hydronephrosis visualized.  Abdominal aorta: Incompletely visualized due to overlying bowel gas, visualized portions within normal limits.  Other findings: None.  IMPRESSION: 1. Hepatic steatosis. CBD not identified, but no intrahepatic biliary ductal dilatation to suggest biliary obstruction. 2. Contracted gallbladder such that chololithiasis is difficult to exclude. No strong evidence of acute cholecystitis.   Electronically Signed   By: Lars Pinks M.D.   On: 10/23/2014 12:05    Scheduled Meds: . pantoprazole  40 mg Oral BID    Continuous Infusions: none     Rise Traeger  [facility] Pager [phone]. If 7PM-7AM, please contact night-coverage at [email or web address], password [facility] 10/24/2014, 4:34 PM  LOS: 1 day

## 2014-10-24 NOTE — Progress Notes (Signed)
Patient complaining of pain 9/10 with no relief from dilaudid. MD aware. No order received at this time. Patient wanting some medicine to calm him down. Klonopin ordered. Will monitor.

## 2014-10-25 LAB — BASIC METABOLIC PANEL
Anion gap: 7 (ref 5–15)
BUN: 9 mg/dL (ref 6–23)
CO2: 31 mmol/L (ref 19–32)
CREATININE: 1.12 mg/dL (ref 0.50–1.35)
Calcium: 9.1 mg/dL (ref 8.4–10.5)
Chloride: 102 mEq/L (ref 96–112)
GFR calc Af Amer: >90 mL/min (ref >90)
GFR, EST NON AFRICAN AMERICAN: 86 mL/min — AB (ref >90)
Glucose, Bld: 91 mg/dL (ref 70–99)
Potassium: 3.8 mmol/L (ref 3.5–5.1)
Sodium: 140 mmol/L (ref 135–145)

## 2014-10-25 LAB — CBC
HEMATOCRIT: 31 % — AB (ref 39.0–52.0)
Hemoglobin: 8.4 g/dL — ABNORMAL LOW (ref 13.0–17.0)
MCH: 16.8 pg — AB (ref 26.0–34.0)
MCHC: 27.1 g/dL — ABNORMAL LOW (ref 30.0–36.0)
MCV: 62 fL — ABNORMAL LOW (ref 78.0–100.0)
Platelets: 497 10*3/uL — ABNORMAL HIGH (ref 150–400)
RBC: 5 MIL/uL (ref 4.22–5.81)
RDW: 23.3 % — ABNORMAL HIGH (ref 11.5–15.5)
WBC: 7.1 10*3/uL (ref 4.0–10.5)

## 2014-10-25 LAB — VITAMIN B12: VITAMIN B 12: 310 pg/mL (ref 211–911)

## 2014-10-25 MED ORDER — SODIUM CHLORIDE 0.9 % IV SOLN
1000.0000 mg | Freq: Once | INTRAVENOUS | Status: AC
  Administered 2014-10-25: 1000 mg via INTRAVENOUS
  Filled 2014-10-25 (×2): qty 20

## 2014-10-25 MED ORDER — OXYCODONE-ACETAMINOPHEN 5-325 MG PO TABS
1.0000 | ORAL_TABLET | ORAL | Status: DC | PRN
  Administered 2014-10-25: 1 via ORAL
  Filled 2014-10-25: qty 1

## 2014-10-25 MED ORDER — SODIUM CHLORIDE 0.9 % IV SOLN
25.0000 mg | Freq: Once | INTRAVENOUS | Status: AC
  Administered 2014-10-25: 25 mg via INTRAVENOUS
  Filled 2014-10-25: qty 0.5

## 2014-10-25 NOTE — Progress Notes (Signed)
NURSING PROGRESS NOTE  Fadel Clason [id number] Discharge Data: 10/25/2014 6:46 PM Attending Provider: Florencia Reasons, MD QMV:[id number],MWUXL W, MD     [name] to be D/C'd Home per MD order.  Discussed with the patient the After Visit Summary and all questions fully answered. All IV's discontinued with no bleeding noted. All belongings returned to patient for patient to take home.   Last Vital Signs:  Blood pressure 123/68, pulse 98, temperature 98.6 F (37 C), temperature source Oral, resp. rate 18, height 6' (1.829 m), weight 80.287 kg (177 lb), SpO2 100 %.  Discharge Medication List   Medication List    STOP taking these medications        predniSONE 20 MG tablet  Commonly known as:  DELTASONE      TAKE these medications        pantoprazole 40 MG tablet  Commonly known as:  PROTONIX  TAKE 1 TABLET (40 MG TOTAL) BY MOUTH 2 (TWO) TIMES DAILY.     tiZANidine 4 MG tablet  Commonly known as:  ZANAFLEX  Take 1 tablet (4 mg total) by mouth every 8 (eight) hours as needed for muscle spasms.         Wallie Renshaw, RN

## 2014-10-25 NOTE — Progress Notes (Signed)
Patient refused to be escorted down by staff.

## 2014-10-25 NOTE — Discharge Summary (Signed)
Discharge Summary  Eric Hood TFT:[id number] DOB: 1981-06-08  PCP: Eulas Post, MD  Admit date: 10/23/2014 Discharge date: 10/25/2014  Time spent: >60mins  Recommendations for Outpatient Follow-up:  1. GI/ Dr. Fuller Plan in 2wks.  Discharge Diagnoses:  Active Hospital Problems   Diagnosis Date Noted  . Anemia 10/23/2014  . Heme positive stool 10/23/2014  . GERD (gastroesophageal reflux disease) 10/23/2014  . Right-sided chest/RUQ pain 10/23/2014  . History of esophagitis and stricture 10/23/2014  . Thoracic back pain 02/11/2013    Resolved Hospital Problems   Diagnosis Date Noted Date Resolved  No resolved problems to display.    Discharge Condition: stable  Diet recommendation: diet as tolerated  Filed Weights   10/23/14 0924 10/23/14 1617  Weight: 83.008 kg (183 lb) 80.287 kg (177 lb)    History of present illness:  Eric Hood is a 34 y.o. male  With h/o GERD, esophagitis, stricture with recent dilitation by Dr. Fuller Plan, presents to [facility] with RUQ/chest pain. Sharp, pleuritic. Recently treated for bronchitis with prednisone taper, albuterol, no antibiotics. In ED, found to have a hgb of 6, heme positive stool. Denies n/v, melena or hematochesia. RUQ ultrasound unremarkable. LFTs, lipase normal. EDP consulted GI. Never had colonoscopy.    Hospital Course:  Principal Problem:   Anemia Active Problems:   Thoracic back pain   Heme positive stool   GERD (gastroesophageal reflux disease)   Right-sided chest/RUQ pain   History of esophagitis and stricture  Iron deficiency anemia with heme positive stool, likely from chronic blood loss from erosive esophagitis. Received prbc transfusion, hgb improved appropriately. Also received iv iron dextran per GI recommendation. Patient is to f/u with GI for outpatient colonoscopy.  Right sided chest/upper abdominal pain, CTA with incidental benign findings as described below. Otherwise no acute findings. Abdominal  US unremarkable. Pain improved with prn pain meds.  Anxiety improved with prn klonopin.   Procedures:  prbc transfusion/iv iron  Consultations:  GI  Discharge Exam: BP 123/68 mmHg  Pulse 98  Temp(Src) 98.6 F (37 C) (Oral)  Resp 18  Ht 6' (1.829 m)  Wt 80.287 kg (177 lb)  BMI 24.00 kg/m2  SpO2 100%  General: NAD Cardiovascular: RRR Respiratory: CTABL  Discharge Instructions You were cared for by a hospitalist during your hospital stay. If you have any questions about your discharge medications or the care you received while you were in the hospital after you are discharged, you can call the unit and asked to speak with the hospitalist on call if the hospitalist that took care of you is not available. Once you are discharged, your primary care physician will handle any further medical issues. Please note that NO REFILLS for any discharge medications will be authorized once you are discharged, as it is imperative that you return to your primary care physician (or establish a relationship with a primary care physician if you do not have one) for your aftercare needs so that they can reassess your need for medications and monitor your lab values.  Discharge Instructions    Diet - low sodium heart healthy    Complete by:  As directed      Increase activity slowly    Complete by:  As directed             Medication List    STOP taking these medications        predniSONE 20 MG tablet  Commonly known as:  [place] these medications  pantoprazole 40 MG tablet  Commonly known as:  PROTONIX  TAKE 1 TABLET (40 MG TOTAL) BY MOUTH 2 (TWO) TIMES DAILY.     tiZANidine 4 MG tablet  Commonly known as:  ZANAFLEX  Take 1 tablet (4 mg total) by mouth every 8 (eight) hours as needed for muscle spasms.       No Known Allergies     Follow-up Information    Follow up with Norberto Sorenson T. Fuller Plan, MD In 2 weeks.   Specialty:  Gastroenterology   Contact information:     [address] [place] [address] [phone]        The results of significant diagnostics from this hospitalization (including imaging, microbiology, ancillary and laboratory) are listed below for reference.    Significant Diagnostic Studies: Dg Chest 2 View  10/23/2014   CLINICAL DATA:  Right upper quadrant pain, fever.  EXAM: CHEST  2 VIEW  COMPARISON:  None.  FINDINGS: The heart size and mediastinal contours are within normal limits. Both lungs are clear. No pneumothorax or pleural effusion is noted. The visualized skeletal structures are unremarkable.  IMPRESSION: No acute cardiopulmonary abnormality seen.   Electronically Signed   By: Sabino Dick M.D.   On: 10/23/2014 11:35   Ct Angio Chest Pe W/cm &/or Wo Cm  10/23/2014   CLINICAL DATA:  34 year old male complaining of history of sharp right lower chest and upper abdominal pain since this morning.  EXAM: CT ANGIOGRAPHY CHEST WITH CONTRAST  TECHNIQUE: Multidetector CT imaging of the chest was performed using the standard protocol during bolus administration of intravenous contrast. Multiplanar CT image reconstructions and MIPs were obtained to evaluate the vascular anatomy.  CONTRAST:  160mL OMNIPAQUE IOHEXOL 350 MG/ML SOLN  COMPARISON:  No priors.  FINDINGS: Mediastinum/Lymph Nodes: No filling defects within the pulmonary arterial tree to suggest underlying pulmonary embolism. Heart size is normal. There is no significant pericardial fluid, thickening or pericardial calcification. No pathologically enlarged mediastinal, hilar or axillary lymph nodes. Multiple borderline enlarged mediastinal lymph nodes measuring up to 9 mm are noted, but are nonspecific. Small hiatal hernia.  Lungs/Pleura: No suspicious appearing pulmonary nodules or masses. No acute consolidative airspace disease. No pleural effusions.  Musculoskeletal/Soft Tissues: There are no aggressive appearing lytic or blastic lesions noted in the visualized portions of the  skeleton.  Upper Abdomen: Ill-defined intermediate attenuation lesion in the right lobe of the liver, predominantly in segment 8 is of uncertain etiology and significance, but appears to have an unusual connection to balls of the right portal vein and the middle hepatic vein, potentially a intrahepatic portal venous shunt. The spleen is incompletely imaged, but appears enlarged measuring up to 14.1 x 5.1 cm on axial images.  Review of the MIP images confirms the above findings.  IMPRESSION: 1. No evidence of pulmonary embolism. 2. No acute findings in the thorax to account for the patient's symptoms. 3. Incidental imaging of the upper abdomen demonstrates what appears to be an intrahepatic portal venous shunt (reference: http://marquez.com/). This is a benign finding, but can be associated with encephalopathy in some individuals. This finding should be further evaluated and confirmed with followup nonemergent MRI of the abdomen with and without IV gadolinium in the near future. 4. There is also apparent splenomegaly.   Electronically Signed   By: Vinnie Langton M.D.   On: 10/23/2014 18:20   US Abdomen Complete  10/23/2014   CLINICAL DATA:  34 year old male with severe right upper quadrant pain today. Recent esophageal dilatation  2 weeks ago. Initial encounter.  EXAM: ULTRASOUND ABDOMEN COMPLETE  COMPARISON:  Chest radiographs 1110 hr today.  FINDINGS: Gallbladder: Contracted, with adjacent bowel gas suspected. "Wall echo shadow sign" felt less likely. No sonographic Murphy sign elicited. No pericholecystic fluid.  Common bile duct: Diameter: Not visualized due to overlying bowel gas.  Liver: Mildly echogenic (image 23). No intrahepatic biliary ductal dilatation is evident. No discrete liver lesion is identified.  IVC: Incompletely visualized due to overlying bowel gas, visualized portions within normal limits.  Pancreas: Incompletely visualized due to overlying bowel gas,  visualized portions within normal limits.  Spleen: Size and appearance within normal limits.  Right Kidney: Length: 12.1 cm. Echogenicity within normal limits. No mass or hydronephrosis visualized.  Left Kidney: Length: 12.2 cm. Echogenicity within normal limits. No mass or hydronephrosis visualized.  Abdominal aorta: Incompletely visualized due to overlying bowel gas, visualized portions within normal limits.  Other findings: None.  IMPRESSION: 1. Hepatic steatosis. CBD not identified, but no intrahepatic biliary ductal dilatation to suggest biliary obstruction. 2. Contracted gallbladder such that chololithiasis is difficult to exclude. No strong evidence of acute cholecystitis.   Electronically Signed   By: Lars Pinks M.D.   On: 10/23/2014 12:05    Microbiology: No results found for this or any previous visit (from the past 240 hour(s)).   Labs: Basic Metabolic Panel:  Recent Labs Lab 10/23/14 0945 10/25/14 0555  NA 138 140  K 3.5 3.8  CL 104 102  CO2 28 31  GLUCOSE 93 91  BUN 10 9  CREATININE 0.90 1.12  CALCIUM 8.5 9.1   Liver Function Tests:  Recent Labs Lab 10/23/14 0945  AST 13  ALT 11  ALKPHOS 73  BILITOT 0.1*  PROT 5.8*  ALBUMIN 3.3*    Recent Labs Lab 10/23/14 0945  LIPASE 23   No results for input(s): AMMONIA in the last 168 hours. CBC:  Recent Labs Lab 10/23/14 0945 10/23/14 1320 10/24/14 0450 10/24/14 1340 10/25/14 0555  WBC 10.9* 11.7* 7.1 7.0 7.1  NEUTROABS 7.7  --   --   --   --   HGB 6.5* 6.2* 7.4* 8.6* 8.4*  HCT 24.9* 23.9* 27.0* 32.2* 31.0*  MCV 59.6* 59.5* 61.5* 60.4* 62.0*  PLT 570* 323 433* 532* 497*   Cardiac Enzymes: No results for input(s): CKTOTAL, CKMB, CKMBINDEX, TROPONINI in the last 168 hours. BNP: BNP (last 3 results) No results for input(s): PROBNP in the last 8760 hours. CBG: No results for input(s): GLUCAP in the last 168 hours.     SignedFlorencia Reasons MD/PhD  [facility] 10/25/2014, 6:34 PM

## 2014-10-26 ENCOUNTER — Telehealth: Payer: Self-pay | Admitting: Gastroenterology

## 2014-10-26 LAB — FOLATE RBC
FOLATE, RBC: 1160 ng/mL (ref >498)
Folate, Hemolysate: 396.8 ng/mL
HEMATOCRIT: 34.2 % — AB (ref 37.5–51.0)

## 2014-10-26 NOTE — Telephone Encounter (Signed)
He needs a colonoscopy and EGD/dilation on the same day. He might have EGD/dilation already scheduled. If procedures can be done before end of Feb he doesn't need office visit with me. If not then REV with me in about 4 weeks. He needs follow up with his PCP for anemia mgmt. Sooner than 4 weeks.

## 2014-10-26 NOTE — Telephone Encounter (Signed)
I have added the colonoscopy to the EGD scheduled for 11/20/14.  He is notified of this and agreeable to this plan.

## 2014-10-26 NOTE — Telephone Encounter (Signed)
Dr. Fuller Plan does he need follow up with you from the hospital?

## 2014-10-28 ENCOUNTER — Ambulatory Visit (INDEPENDENT_AMBULATORY_CARE_PROVIDER_SITE_OTHER): Payer: 59 | Admitting: Family Medicine

## 2014-10-28 ENCOUNTER — Encounter: Payer: Self-pay | Admitting: Family Medicine

## 2014-10-28 VITALS — BP 132/86 | HR 97 | Ht 72.0 in | Wt 183.0 lb

## 2014-10-28 VITALS — BP 130/68 | HR 104 | Temp 98.5°F | Wt 186.0 lb

## 2014-10-28 DIAGNOSIS — M9902 Segmental and somatic dysfunction of thoracic region: Secondary | ICD-10-CM

## 2014-10-28 DIAGNOSIS — M9901 Segmental and somatic dysfunction of cervical region: Secondary | ICD-10-CM

## 2014-10-28 DIAGNOSIS — M899 Disorder of bone, unspecified: Secondary | ICD-10-CM

## 2014-10-28 DIAGNOSIS — F418 Other specified anxiety disorders: Secondary | ICD-10-CM

## 2014-10-28 DIAGNOSIS — D62 Acute posthemorrhagic anemia: Secondary | ICD-10-CM

## 2014-10-28 DIAGNOSIS — M999 Biomechanical lesion, unspecified: Secondary | ICD-10-CM

## 2014-10-28 DIAGNOSIS — M9904 Segmental and somatic dysfunction of sacral region: Secondary | ICD-10-CM

## 2014-10-28 DIAGNOSIS — M9903 Segmental and somatic dysfunction of lumbar region: Secondary | ICD-10-CM

## 2014-10-28 LAB — CBC WITH DIFFERENTIAL/PLATELET
Hemoglobin: 8.8 g/dL — ABNORMAL LOW (ref 13.0–17.0)
MCHC: 28 g/dL — AB (ref 30.0–36.0)
PLATELETS: 542 10*3/uL — AB (ref 150.0–400.0)
RBC: 5 Mil/uL (ref 4.22–5.81)
RDW: 25 % — ABNORMAL HIGH (ref 11.5–15.5)
WBC: 13.5 10*3/uL — AB (ref 4.0–10.5)

## 2014-10-28 MED ORDER — NORTRIPTYLINE HCL 25 MG PO CAPS: 25.0000 mg | ORAL_CAPSULE | Freq: Every day | ORAL | Status: DC

## 2014-10-28 MED ORDER — CLONAZEPAM 0.5 MG PO TABS: 0.5000 mg | ORAL_TABLET | Freq: Two times a day (BID) | ORAL | Status: DC | PRN

## 2014-10-28 MED ORDER — TRAMADOL HCL 50 MG PO TABS: 50.0000 mg | ORAL_TABLET | Freq: Two times a day (BID) | ORAL | Status: DC

## 2014-10-28 NOTE — Progress Notes (Signed)
Pre visit review using our clinic review tool, if applicable. No additional management support is needed unless otherwise documented below in the visit note. 

## 2014-10-28 NOTE — Assessment & Plan Note (Signed)
Decision today to treat with OMT was based on Physical Exam  After verbal consent patient was treated with HVLA, ME, FPR techniques in Cervical, thoracic,  lumbar and sacral areas  Patient tolerated the procedure well with improvement in symptoms  Patient given exercises, stretches and lifestyle modifications  See medications in patient instructions if given  Patient will follow up in 2 weeks

## 2014-10-28 NOTE — Patient Instructions (Signed)
Good to see you Try the nortriptyline at night Tramadol up to 2 times daily as needed Ice is your friend Iron 325mg  daily but stop if it hurts your stomach Ask Dr. Fuller Plan when to stop the iron before the colonoscopy See me again in 2 weeks.

## 2014-10-28 NOTE — Patient Instructions (Signed)
Follow up immediately for dizziness, vomiting blood, or any black/tarry stools.

## 2014-10-28 NOTE — Progress Notes (Signed)
   Subjective:    Patient ID: Eric Hood, male    DOB: 1981-01-23, 34 y.o.   MRN: [id number]  HPI Patient seen for hospital follow-up for anemia. He has history of GERD, esophagitis, and esophageal stricture. He's had multiple dilatations within the past year. He was admitted on 10/23/2014 and presented with right upper quadrant/chest pain which was somewhat sharp and pleuritic. On further evaluation, hemoglobin 6 with heme positive stool. He denied any melena, hemoptysis, hematochezia, nausea or vomiting. Ultrasound unremarkable. CT angiogram no PE. Chest x-ray no acute findings. LFTs and lipase were normal. Patient was transfused blood and also given IV iron dextran. Discharge hemoglobin 8.4  He had some initial dizziness but none at this point. His appetite is fair. No problems since discharge. He remains on Protonix 40 mg twice a day. He has follow-up for repeat upper endoscopy and colonoscopy within a couple weeks.  He states he is extremely anxious regarding recent events. He received some Clonazepam in the hospital and requesting a few for severe anxiety. He has been prescribed SSRIs in the past but is not taking these consistently stating that he does not wish to take a medication daily.  Past Medical History  Diagnosis Date  . Back pain   . Reflux   . Esophageal stricture 03/2014  . Hiatal hernia   . Ulcerative esophagitis 03/2014    severe  . Anxiety   . GERD (gastroesophageal reflux disease)   . Anemia 10/23/2014   Past Surgical History  Procedure Laterality Date  . Hand surgery Right   . Esophageal dilation      On 3 occasions: 03/2014, 08/2014, 09/2014    reports that he has never smoked. He has never used smokeless tobacco. He reports that he does not drink alcohol or use illicit drugs. family history includes Hypertension in his father and mother. There is no history of Colon cancer, Esophageal cancer, Rectal cancer, or Stomach cancer. No Known Allergies    Review  of Systems  Constitutional: Negative for fever, chills, appetite change and unexpected weight change.  Respiratory: Negative for cough and shortness of breath.   Cardiovascular: Negative for chest pain and palpitations.  Gastrointestinal: Negative for nausea, vomiting, abdominal pain, diarrhea, constipation and blood in stool.  Neurological: Negative for dizziness and syncope.       Objective:   Physical Exam  Constitutional: He appears well-developed and well-nourished.  Neck: Neck supple. No thyromegaly present.  Cardiovascular: Normal rate and regular rhythm.   Pulmonary/Chest: Effort normal and breath sounds normal. No respiratory distress. He has no wheezes. He has no rales.  Abdominal: Soft. Bowel sounds are normal. He exhibits no distension and no mass. There is no tenderness. There is no rebound and no guarding.  Neurological: He is alert.  Skin:  Nailbeds appear pink and conjunctivae are also pink          Assessment & Plan:  Patient recently admitted for anemia and heme positive stool. History of reflux esophagitis and stricture. Clinically appears stable. Recheck CBC. Continue close follow-up with GI as scheduled. Avoid all non-steroidals  Situational anxiety. We recommended against regular use of benzodiazepines. We agreed to short-term only clonazepam 0.5 mg 1 twice a day for severe anxiety symptoms #30 with no refill

## 2014-10-28 NOTE — Progress Notes (Signed)
[name] [address] [place], [place] [address] Phone: [phone] Subjective:     CC: Thoracic back pain, follow up  [id number] Eric Hood is a 34 y.o. male coming in with complaint of back pain. Patient was seen previously and had more of a muscle imbalance. Patient has been having some increasing pain in his back. Patient has recently had a bronchitis and cough which seems to have exacerbated the back problem. This is causing him to have difficulty at work. Patient was also seen recently in the emergency department because he was having more abdominal pain. Patient was found to have severe anemia. Patient actually had a hemoglobin of 6.4. Patient did have to transfusions as well as given iron. Patient states since then he's been feeling a little bit better. Patient had a recheck of his CBC today which seems to be holding strong over the last 72 hours at 8.4. Patient continues to have fatigue in his back pain has not made any significant improvement. Patient was started on nortriptyline that night but he has not taken them regularly. Patient states that it just still continues to give him a dull muscle ache of the upper back. Finding it difficult to do his job. Past medical history, social, surgical and family history all reviewed in electronic medical record.   Review of Systems: No headache, visual changes, nausea, vomiting, diarrhea, constipation, dizziness, abdominal pain, skin rash, fevers, chills, night sweats, weight loss, swollen lymph nodes, body aches, joint swelling, muscle aches, chest pain, shortness of breath, mood changes.   Objective Blood pressure 132/86, pulse 97, height 6' (1.829 m), weight 183 lb (83.008 kg), SpO2 99 %.  General: No apparent distress alert and oriented x3 mood and affect normal, dressed appropriately. Patient does appear to be uncomfortable today. HEENT: Pupils equal, extraocular movements intact  Respiratory:  Patient's speak in full sentences and does not appear short of breath  Cardiovascular: No lower extremity edema, non tender, no erythema  Skin: Warm dry intact with no signs of infection or rash on extremities or on axial skeleton.  Abdomen: Soft nontender  Neuro: Cranial nerves II through XII are intact, neurovascularly intact in all extremities with 2+ DTRs and 2+ pulses.  Lymph: No lymphadenopathy of posterior or anterior cervical chain or axillae bilaterally.  Gait normal with good balance and coordination.  MSK:  Non tender with full range of motion and good stability and symmetric strength and tone of shoulders, elbows, wrist, hip, knee and ankles bilaterally.  Back Exam:  Inspection: Mild increase in kyphosis of the upper thoracic spine Motion: Flexion 40 deg, Extension 35 deg, Side Bending to 45 deg bilaterally, continued tightness of musculature Rotation to 45 deg bilaterally  SLR laying: Negative  XSLR laying: Negative  Palpable tenderness: None. FABER: negative. Sensory change: Gross sensation intact to all lumbar and sacral dermatomes.  Reflexes: 2+ at both patellar tendons, 2+ at achilles tendons, Babinski's downgoing.  Strength at foot  Plantar-flexion: 5/5 Dorsi-flexion: 5/5 Eversion: 5/5 Inversion: 5/5  Leg strength  Quad: 5/5 Hamstring: 5/5 Hip flexor: 5/5 Hip abductors:4/5  Gait unremarkable. Continues to have scapular dysfunction  OMT Physical Exam  Cervical  C2 flexed rotated and side bent left  Thoracic T5 extended rotated inside that right T8 extended rotated and side bent left  Lumbar L2 flexed rotated and side bent right  Sacrum Left on left  Illium Neutral    Impression and Recommendations:     This case required  medical decision making of moderate complexity.

## 2014-10-28 NOTE — Assessment & Plan Note (Signed)
Patient will continue to have scapular dysfunction I do think that patient's underlying low iron is likely [address]. We discussed the icing protocol as well as a home exercise again and continuing to do this. Patient was given a prescription for tramadol for any breakthrough pain. Patient has had difficulty with opiate dependence previously so we'll not do any stronger medication. Patient also told that this will be a short-term prescription. Patient can continue with the muscle relaxer and patient was recently given Klonopin for stress. But do feel that the underlying ulcers will need to be also treated. Patient is taking Protonix 2 times daily. Patient will continue the other medication and we discussed the possibility of iron supplementation but he would need to stop this before his colonoscopy in the next weeks. Patient is following up with gastroenterology very soon. Patient will see me again no in 2 weeks for further evaluation.

## 2014-10-29 ENCOUNTER — Encounter: Payer: 59 | Admitting: Gastroenterology

## 2014-11-04 ENCOUNTER — Ambulatory Visit: Payer: 59 | Admitting: Family Medicine

## 2014-11-13 ENCOUNTER — Ambulatory Visit (INDEPENDENT_AMBULATORY_CARE_PROVIDER_SITE_OTHER): Payer: 59 | Admitting: Family Medicine

## 2014-11-13 ENCOUNTER — Encounter (HOSPITAL_COMMUNITY): Payer: Self-pay

## 2014-11-13 ENCOUNTER — Ambulatory Visit (AMBULATORY_SURGERY_CENTER): Payer: Self-pay

## 2014-11-13 ENCOUNTER — Encounter: Payer: Self-pay | Admitting: Family Medicine

## 2014-11-13 ENCOUNTER — Ambulatory Visit (HOSPITAL_COMMUNITY)
Admission: RE | Admit: 2014-11-13 | Discharge: 2014-11-13 | Disposition: A | Discharge status: Home / Self Care | Payer: 59 | Source: Ambulatory Visit | Attending: Family Medicine | Admitting: Family Medicine

## 2014-11-13 VITALS — Ht 72.0 in | Wt 173.0 lb

## 2014-11-13 VITALS — BP 130/86 | HR 87 | Temp 98.5°F | Ht 72.0 in | Wt 176.0 lb

## 2014-11-13 DIAGNOSIS — R1031 Right lower quadrant pain: Secondary | ICD-10-CM

## 2014-11-13 DIAGNOSIS — K2100 Gastro-esophageal reflux disease with esophagitis, without bleeding: Secondary | ICD-10-CM

## 2014-11-13 MED ORDER — IOHEXOL 300 MG/ML  SOLN
100.0000 mL | Freq: Once | INTRAMUSCULAR | Status: AC | PRN
  Administered 2014-11-13: 100 mL via INTRAVENOUS

## 2014-11-13 MED ORDER — MOVIPREP 100 G PO SOLR
1.0000 | Freq: Once | ORAL | Status: DC
Start: 2014-11-13 — End: 2015-01-20

## 2014-11-13 NOTE — Progress Notes (Signed)
Pre visit review using our clinic review tool, if applicable. No additional management support is needed unless otherwise documented below in the visit note. 

## 2014-11-13 NOTE — Progress Notes (Signed)
  [name] [address], [place] [address] Phone: [phone] Subjective:     CC: Thoracic back pain, follow up  [id number] Eric Hood is a 34 y.o. male coming in with complaint of back pain. Patients usually sees me for manipulation therapy. Patient did have a bleeding ulcer that did cause a hospitalization recently. Patient states he was recovering from that but unfortunately over the course last week he has been feeling significantly worse. Patient states that he can feels lightheaded. Patient has not had any appetite and is complaining of some abdominal pain. Patient states that this abdominal pain seems to be localized mostly over the right lower quadrant. Denies any radiation. Patient has not eaten for 3 days and it is only because no appetite. Patient denies any fevers but states that he is having chills over the course last 24 hours. Denies any bright red blood per rectum or diarrhea.  Review of Systems: No headache, visual changes, nausea, vomiting, diarrhea, constipation, dizziness, abdominal pain, skin rash, fevers, chills, night sweats, weight loss, swollen lymph nodes, body aches, joint swelling, muscle aches, chest pain, shortness of breath, mood changes.   Objective Blood pressure 130/86, pulse 87, temperature 98.5 F (36.9 C), temperature source Oral, height 6' (1.829 m), weight 176 lb (79.833 kg), SpO2 99 %.  General: Patient is ill and appears to be pale HEENT: Pupils equal, extraocular movements intact  Respiratory: Patient's speak in full sentences and does not appear short of breath  Cardiovascular: No lower extremity edema, non tender, no erythema  Skin: Warm dry intact with no signs of infection or rash on extremities or on axial skeleton.  Abdomen: Soft abdomen at this time. Patient is severely tender in the right lower quadrant. Patient does have involuntary guarding as well as rebound tenderness. This is localized over  McBurney's point. Negative psoas sign. Minimal pain of the upper gastric area. Neuro: Cranial nerves II through XII are intact, neurovascularly intact in all extremities with 2+ DTRs and 2+ pulses.  Lymph: No lymphadenopathy of posterior or anterior cervical chain or axillae bilaterally.  Gait normal with good balance and coordination.  MSK:  Non tender with full range of motion and good stability and symmetric strength and tone of shoulders, elbows, wrist, hip, knee and ankles bilaterally.    Impression and Recommendations:     This case required medical decision making of moderate complexity.

## 2014-11-13 NOTE — Patient Instructions (Signed)
Good to see you Drink water.  We need to check your appendix with a CT scan.  We will discuss what to do next when I get the results.

## 2014-11-13 NOTE — Progress Notes (Signed)
No allergies to eggs or soy n o diet/weight loss meds No past problems with anesthesia No home oxygen  Has email  Emmi instructions given for colonoscopy and endoscopy

## 2014-11-13 NOTE — Assessment & Plan Note (Signed)
Patient's acute right lower quadrant pain associated with decreased appetite as well as rebound tenderness and involuntary guarding increases the likelihood of this being approximately 80% of a and appendicitis. I do feel that advance imaging emergently is necessary. Patient is going to have a CT scan with and without contrast to evaluate for the appendicitis. Patient did have a bleeding ulcer not too long ago as well which will help with the differential. Patient will have this done and we will call him. Patient knows if any worsening symptoms patient will go to the emergency department immediately. Discussed trying to stay well hydrated.

## 2014-11-16 ENCOUNTER — Telehealth: Payer: Self-pay | Admitting: Family Medicine

## 2014-11-16 NOTE — Telephone Encounter (Signed)
Pt states he is having stomach problems and having esophagus stretched for the 4 th time with in a yr. Having a hard time eating w/out this med.

## 2014-11-16 NOTE — Telephone Encounter (Signed)
Pt called stated that he is experiencing stomach problem and throwing up. Please call pt

## 2014-11-16 NOTE — Telephone Encounter (Signed)
Last visit 10/28/14 Last refill 10/28/14 #30 0 refill

## 2014-11-16 NOTE — Telephone Encounter (Signed)
Left detailed msg on pt's vmail to advise him to go to the ER or try calling Dr. Lynne Leader office.

## 2014-11-16 NOTE — Telephone Encounter (Signed)
As discussed, do not recommend regular use of benzodiazepines.  Would consider SSRI if symptoms of anxiety are daily and pervasive- though he has been reluctant to take in the past.

## 2014-11-16 NOTE — Telephone Encounter (Signed)
Pt request refill of the following: clonazePAM (KLONOPIN) 0.5 MG tablet   Phamacy:  [facility]

## 2014-11-18 ENCOUNTER — Ambulatory Visit: Payer: 59 | Admitting: Family Medicine

## 2014-11-18 NOTE — Telephone Encounter (Signed)
i recommend trial of Lexapro 10 mg once daily.  He must take this EVERY day and not prn.

## 2014-11-18 NOTE — Telephone Encounter (Signed)
Pt does not want to try this medication. Pt stated that he has taking this medication before.

## 2014-11-20 ENCOUNTER — Ambulatory Visit (AMBULATORY_SURGERY_CENTER): Payer: 59 | Admitting: Gastroenterology

## 2014-11-20 ENCOUNTER — Other Ambulatory Visit (INDEPENDENT_AMBULATORY_CARE_PROVIDER_SITE_OTHER): Payer: 59

## 2014-11-20 ENCOUNTER — Other Ambulatory Visit: Payer: Self-pay

## 2014-11-20 ENCOUNTER — Encounter: Payer: Self-pay | Admitting: Gastroenterology

## 2014-11-20 ENCOUNTER — Telehealth: Payer: Self-pay | Admitting: Family Medicine

## 2014-11-20 VITALS — BP 101/62 | HR 64 | Temp 97.2°F | Resp 25 | Ht 72.0 in | Wt 173.0 lb

## 2014-11-20 DIAGNOSIS — D649 Anemia, unspecified: Secondary | ICD-10-CM

## 2014-11-20 DIAGNOSIS — R195 Other fecal abnormalities: Secondary | ICD-10-CM

## 2014-11-20 DIAGNOSIS — R1031 Right lower quadrant pain: Secondary | ICD-10-CM

## 2014-11-20 DIAGNOSIS — K222 Esophageal obstruction: Secondary | ICD-10-CM

## 2014-11-20 DIAGNOSIS — K2100 Gastro-esophageal reflux disease with esophagitis, without bleeding: Secondary | ICD-10-CM

## 2014-11-20 DIAGNOSIS — D500 Iron deficiency anemia secondary to blood loss (chronic): Secondary | ICD-10-CM

## 2014-11-20 DIAGNOSIS — R1314 Dysphagia, pharyngoesophageal phase: Secondary | ICD-10-CM

## 2014-11-20 LAB — CBC WITH DIFFERENTIAL/PLATELET
BASOS PCT: 0.6 % (ref 0.0–3.0)
Basophils Absolute: 0 10*3/uL (ref 0.0–0.1)
EOS ABS: 0 10*3/uL (ref 0.0–0.7)
EOS PCT: 0.4 % (ref 0.0–5.0)
HEMATOCRIT: 36.6 % — AB (ref 39.0–52.0)
HEMOGLOBIN: 11.5 g/dL — AB (ref 13.0–17.0)
Lymphocytes Relative: 21.7 % (ref 12.0–46.0)
Lymphs Abs: 1.4 10*3/uL (ref 0.7–4.0)
MCHC: 31.3 g/dL (ref 30.0–36.0)
MCV: 71.8 fl — ABNORMAL LOW (ref 78.0–100.0)
Monocytes Absolute: 0.2 10*3/uL (ref 0.1–1.0)
Monocytes Relative: 3.6 % (ref 3.0–12.0)
NEUTROS PCT: 73.7 % (ref 43.0–77.0)
Neutro Abs: 4.6 10*3/uL (ref 1.4–7.7)
Platelets: 478 10*3/uL — ABNORMAL HIGH (ref 150.0–400.0)
RBC: 5.09 Mil/uL (ref 4.22–5.81)
RDW: 37.4 % — ABNORMAL HIGH (ref 11.5–15.5)
WBC: 6.3 10*3/uL (ref 4.0–10.5)

## 2014-11-20 MED ORDER — SODIUM CHLORIDE 0.9 % IV SOLN: 500.0000 mL | INTRAVENOUS | Status: DC

## 2014-11-20 NOTE — Progress Notes (Signed)
Stable to RR 

## 2014-11-20 NOTE — Op Note (Signed)
[place]  [address] [place], [address]   ENDOSCOPY PROCEDURE REPORT  PATIENT: Eric Hood, Eric Hood  MR#: [id number] BIRTHDATE: 12/27/1980 , 33  yrs. old GENDER: male ENDOSCOPIST: Ladene Artist, MD, [facility] PROCEDURE DATE:  11/20/2014 PROCEDURE:  EGD w/ wire guided (savary) dilation ASA CLASS:     Class II INDICATIONS:  dysphagia and esophageal stricture. MEDICATIONS: Monitored anesthesia care, Residual sedation present, and Propofol 150 mg IV TOPICAL ANESTHETIC: none DESCRIPTION OF PROCEDURE: After the risks benefits and alternatives of the procedure were thoroughly explained, informed consent was obtained.  The LB [id number] endoscope was introduced through the mouth and advanced to the second portion of the duodenum , Without limitations.  The instrument was slowly withdrawn as the mucosa was fully examined.    ESOPHAGUS: There was LA Class C esophagitis (Mucosal breaks continuous between > 2 mucosal folds, but involving less than 75% of the esophageal circumference) noted.   There was a friable and benign appearing stricture with an inner diameter of 28mm in the mid esophagus.  The stricture was traversable with resistance.  The stricture was dilated using a 55mm (39Fr) savary dilator over guidewire.  Following this dilation, there was a moderate amount of heme.  The stricture was dilated using a 80mm (42Fr) savary dilator over guidewire.  Following this dilation, there was a moderate amount of heme. STOMACH: The mucosa of the stomach appeared normal. DUODENUM: The duodenal mucosa showed no abnormalities.  Retroflexed views revealed a 5 cm hiatal hernia.  The scope was then withdrawn from the patient and the procedure completed.  COMPLICATIONS: There were no immediate complications.  ENDOSCOPIC IMPRESSION: 1.   LA Class C esophagitis 2.   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   5 cm hiatal  hernia  RECOMMENDATIONS: 1.  Anti-reflux regimen long term 2.  Continue PPI bid (every day) long term 3.  Post dilation instructions 4.  OP follow-up with me in 6 weeks and with Dr. Elease Hashimoto in 4 weeks 5.  CBC today  eSigned:  Ladene Artist, MD, [facility] 11/20/2014 1:58 PM

## 2014-11-20 NOTE — Telephone Encounter (Signed)
Patient no showed for fu on 2/3.  Please advise.

## 2014-11-20 NOTE — Patient Instructions (Addendum)
YOU HAD AN ENDOSCOPIC PROCEDURE TODAY AT [facility]: Refer to the procedure report that was given to you for any specific questions about what was found during the examination.  If the procedure report does not answer your questions, please call your gastroenterologist to clarify.  If you requested that your care partner not be given the details of your procedure findings, then the procedure report has been included in a sealed envelope for you to review at your convenience later.  YOU SHOULD EXPECT: Some feelings of bloating in the abdomen. Passage of more gas than usual.  Walking can help get rid of the air that was put into your GI tract during the procedure and reduce the bloating. If you had a lower endoscopy (such as a colonoscopy or flexible sigmoidoscopy) you may notice spotting of blood in your stool or on the toilet paper. If you underwent a bowel prep for your procedure, then you may not have a normal bowel movement for a few days.  DIET:  Drink plenty of fluids but you should avoid alcoholic beverages for 24 hours.  Please follow the dilatation diet the rest of the day.  ACTIVITY: Your care partner should take you home directly after the procedure.  You should plan to take it easy, moving slowly for the rest of the day.  You can resume normal activity the day after the procedure however you should NOT DRIVE or use heavy machinery for 24 hours (because of the sedation medicines used during the test).    SYMPTOMS TO REPORT IMMEDIATELY: A gastroenterologist can be reached at any hour.  During normal business hours, 8:30 AM to 5:00 PM Monday through Friday, call [phone].  After hours and on weekends, please call the GI answering service at [phone] who will take a message and have the physician on call contact you.   Following lower endoscopy (colonoscopy or flexible sigmoidoscopy):  Excessive amounts of blood in the stool  Significant tenderness or worsening of  abdominal pains  Swelling of the abdomen that is new, acute  Fever of 100F or higher  Following upper endoscopy (EGD)  Vomiting of blood or coffee ground material  New chest pain or pain under the shoulder blades  Painful or persistently difficult swallowing  New shortness of breath  Fever of 100F or higher  Black, tarry-looking stools  FOLLOW UP: If any biopsies were taken you will be contacted by phone or by letter within the next 1-3 weeks.  Call your gastroenterologist if you have not heard about the biopsies in 3 weeks.  Our staff will call the home number listed on your records the next business day following your procedure to check on you and address any questions or concerns that you may have at that time regarding the information given to you following your procedure. This is a courtesy call and so if there is no answer at the home number and we have not heard from you through the emergency physician on call, we will assume that you have returned to your regular daily activities without incident.  SIGNATURES/CONFIDENTIALITY: You and/or your care partner have signed paperwork which will be entered into your electronic medical record.  These signatures attest to the fact that that the information above on your After Visit Summary has been reviewed and is understood.  Full responsibility of the confidentiality of this discharge information lies with you and/or your care-partner.    Handouts were given to you on a dilatation diet,  esophagitis, stricture and GERD. You might notice some irritation in your nose or drainage.  This may cause feelings of congestion.  This is from the oxygen, which can be drying.  This is no cause for concern; this should clear up in a few days.  You may resume your current medications today. Please follow the dilatation diet the rest of the day. Please call if any questions or concerns.

## 2014-11-20 NOTE — Op Note (Signed)
[place]  [address]. [place], [address]   COLONOSCOPY PROCEDURE REPORT  PATIENT: Eric Hood, Eric Hood  MR#: [id number] BIRTHDATE: Apr 18, 1981 , 33  yrs. old GENDER: male ENDOSCOPIST: Ladene Artist, MD, [facility] PROCEDURE DATE:  11/20/2014 PROCEDURE:   Colonoscopy, diagnostic First Screening Colonoscopy - Avg.  risk and is 50 yrs.  old or older - No.  Prior Negative Screening - Now for repeat screening. N/A  History of Adenoma - Now for follow-up colonoscopy & has been > or = to 3 yrs.  N/A  Polyps Removed Today? No.  Polyps Removed Today? No.  Recommend repeat exam, <10 yrs? Polyps Removed Today? No.  Recommend repeat exam, <10 yrs? No. ASA CLASS:   Class II INDICATIONS:heme-positive stool and iron deficiency anemia. MEDICATIONS: Monitored anesthesia care, Propofol 300 mg IV, and lidocaine 40 mg IV DESCRIPTION OF PROCEDURE:   After the risks benefits and alternatives of the procedure were thoroughly explained, informed consent was obtained.  The digital rectal exam revealed no abnormalities of the rectum.   The LB [id number]  endoscope was introduced through the anus and advanced to the cecum, which was identified by both the appendix and ileocecal valve. No adverse events experienced.   The quality of the prep was excellent, using MoviPrep  The instrument was then slowly withdrawn as the colon was fully examined.    COLON FINDINGS: A normal appearing cecum, ileocecal valve, and appendiceal orifice were identified.  The ascending, transverse, descending, sigmoid colon, and rectum appeared unremarkable. Retroflexed views revealed no abnormalities. The time to cecum=3 minutes 22 seconds.  Withdrawal time=9 minutes 40 seconds.  The scope was withdrawn and the procedure completed. COMPLICATIONS: There were no immediate complications.  ENDOSCOPIC IMPRESSION: Normal colonoscopy  RECOMMENDATIONS: Continue current colorectal screening recommendations for  "routine risk" patients with a repeat colonoscopy at age 78.  eSigned:  Ladene Artist, MD, [facility] 11/20/2014 1:44 PM

## 2014-11-20 NOTE — Progress Notes (Signed)
The pt said it was ok to tell his mother he was going to the lab for blood work on discharge.  Pt Is HIPPA.  No complaints on discharge.  maw

## 2014-11-23 ENCOUNTER — Telehealth: Payer: Self-pay | Admitting: *Deleted

## 2014-11-23 NOTE — Telephone Encounter (Signed)
Noted  

## 2014-11-23 NOTE — Telephone Encounter (Signed)
  Follow up Call-  Call back number 11/20/2014 10/02/2014 09/08/2014 04/10/2014  Post procedure Call Back phone  # [phone]  Permission to leave phone message Yes Yes Yes Yes     Patient questions:  Do you have a fever, pain , or abdominal swelling? No. Pain Score  0 *  Have you tolerated food without any problems? Yes.    Have you been able to return to your normal activities? Yes.    Do you have any questions about your discharge instructions: Diet   No. Medications  No. Follow up visit  Yes.    Do you have questions or concerns about your Care? No.  Actions: * If pain score is 4 or above: No action needed, pain <4.  Pt. Stated that someone called him on Friday, made pt. Aware that he need to follow up with the doctor in 6 weeks  And to attempt to call the office this morning to schedule follow up, verbalize understanding.

## 2014-12-01 ENCOUNTER — Emergency Department (HOSPITAL_BASED_OUTPATIENT_CLINIC_OR_DEPARTMENT_OTHER)
Admission: EM | Admit: 2014-12-01 | Discharge: 2014-12-01 | Disposition: A | Discharge status: Home / Self Care | Payer: 59 | Attending: Emergency Medicine | Admitting: Emergency Medicine

## 2014-12-01 ENCOUNTER — Encounter (HOSPITAL_BASED_OUTPATIENT_CLINIC_OR_DEPARTMENT_OTHER): Payer: Self-pay | Admitting: *Deleted

## 2014-12-01 ENCOUNTER — Emergency Department (HOSPITAL_BASED_OUTPATIENT_CLINIC_OR_DEPARTMENT_OTHER): Payer: 59

## 2014-12-01 DIAGNOSIS — Y92322 Soccer field as the place of occurrence of the external cause: Secondary | ICD-10-CM | POA: Diagnosis not present

## 2014-12-01 DIAGNOSIS — Z862 Personal history of diseases of the blood and blood-forming organs and certain disorders involving the immune mechanism: Secondary | ICD-10-CM | POA: Insufficient documentation

## 2014-12-01 DIAGNOSIS — S6992XA Unspecified injury of left wrist, hand and finger(s), initial encounter: Secondary | ICD-10-CM | POA: Insufficient documentation

## 2014-12-01 DIAGNOSIS — Z79899 Other long term (current) drug therapy: Secondary | ICD-10-CM | POA: Insufficient documentation

## 2014-12-01 DIAGNOSIS — F419 Anxiety disorder, unspecified: Secondary | ICD-10-CM | POA: Diagnosis not present

## 2014-12-01 DIAGNOSIS — K219 Gastro-esophageal reflux disease without esophagitis: Secondary | ICD-10-CM | POA: Insufficient documentation

## 2014-12-01 DIAGNOSIS — W1839XA Other fall on same level, initial encounter: Secondary | ICD-10-CM | POA: Diagnosis not present

## 2014-12-01 DIAGNOSIS — Y9366 Activity, soccer: Secondary | ICD-10-CM | POA: Insufficient documentation

## 2014-12-01 DIAGNOSIS — Y998 Other external cause status: Secondary | ICD-10-CM | POA: Insufficient documentation

## 2014-12-01 DIAGNOSIS — M25532 Pain in left wrist: Secondary | ICD-10-CM

## 2014-12-01 MED ORDER — IBUPROFEN 800 MG PO TABS: 800.0000 mg | ORAL_TABLET | Freq: Three times a day (TID) | ORAL | Status: DC

## 2014-12-01 MED ORDER — IBUPROFEN 800 MG PO TABS
800.0000 mg | ORAL_TABLET | Freq: Once | ORAL | Status: AC
  Administered 2014-12-01: 800 mg via ORAL
  Filled 2014-12-01: qty 1

## 2014-12-01 MED ORDER — HYDROCODONE-ACETAMINOPHEN 5-325 MG PO TABS: 1.0000 | ORAL_TABLET | Freq: Four times a day (QID) | ORAL | Status: DC | PRN

## 2014-12-01 NOTE — Discharge Instructions (Signed)
Scaphoid Fracture, Wrist A fracture is a break in the bone. The bone you have broken often does not show up as a fracture on x-ray until later on in the healing phase. This bone is called the scaphoid bone. With this bone, your caregiver will often cast or splint your wrist as though it is fractured, even if a fracture is not seen on the x-ray. This is often done with wrist injuries in which there is tenderness at the base of the thumb. An x-ray at 1-3 weeks after your injury may confirm this fracture. A cast or splint is used to protect and keep your injured bone in good position for healing. The cast or splint will be on generally for about 6 to 16 weeks, depending on your health, age, the fracture location and how quickly you heal. Another name for the scaphoid bone is the navicular bone. HOME CARE INSTRUCTIONS   To lessen the swelling and pain, keep the injured part elevated above your heart while sitting or lying down.  Apply ice to the injury for 15-20 minutes, 03-04 times per day while awake, for 2 days. Put the ice in a plastic bag and place a thin towel between the bag of ice and your cast.  If you have a plaster or fiberglass cast or splint:  Do not try to scratch the skin under the cast using sharp or pointed objects.  Check the skin around the cast every day. You may put lotion on any red or sore areas.  Keep your cast or splint dry and clean.  If you have a plaster splint:  Wear the splint as directed.  You may loosen the elastic bandage around the splint if your fingers become numb, tingle, or turn cold or blue.  If you have been put in a removable splint, wear and use as directed.  Do not put pressure on any part of your cast or splint; it may deform or break. Rest your cast or splint only on a pillow the first 24 hours until it is fully hardened.  Your cast or splint can be protected during bathing with a plastic bag. Do not lower the cast or splint into water.  Only take  over-the-counter or prescription medicines for pain, discomfort, or fever as directed by your caregiver.  If your caregiver has given you a follow up appointment, it is very important to keep that appointment. Not keeping the appointment could result in chronic pain and decreased function. If there is any problem keeping the appointment, you must call back to this facility for assistance. SEEK IMMEDIATE MEDICAL CARE IF:   Your cast gets damaged, wet or breaks.  You have continued severe pain or more swelling than you did before the cast or splint was put on.  Your skin or nails below the injury turn blue or gray, or feel cold or numb.  You have tingling or burning pain in your fingers or increasing pain with movement of your fingers Document Released: 09/22/2002 Document Revised: 12/25/2011 Document Reviewed: 05/21/2009 [facility] Patient Information 2015 [place], [place]. This information is not intended to replace advice given to you by your health care provider. Make sure you discuss any questions you have with your health care provider.  Cast or Splint Care Casts and splints support injured limbs and keep bones from moving while they heal. It is important to care for your cast or splint at home.  HOME CARE INSTRUCTIONS  Keep the cast or splint uncovered during the  drying period. It can take 24 to 48 hours to dry if it is made of plaster. A fiberglass cast will dry in less than 1 hour.  Do not rest the cast on anything harder than a pillow for the first 24 hours.  Do not put weight on your injured limb or apply pressure to the cast until your health care provider gives you permission.  Keep the cast or splint dry. Wet casts or splints can lose their shape and may not support the limb as well. A wet cast that has lost its shape can also create harmful pressure on your skin when it dries. Also, wet skin can become infected.  Cover the cast or splint with a plastic bag when bathing or when  out in the rain or snow. If the cast is on the trunk of the body, take sponge baths until the cast is removed.  If your cast does become wet, dry it with a towel or a blow dryer on the cool setting only.  Keep your cast or splint clean. Soiled casts may be wiped with a moistened cloth.  Do not place any hard or soft foreign objects under your cast or splint, such as cotton, toilet paper, lotion, or powder.  Do not try to scratch the skin under the cast with any object. The object could get stuck inside the cast. Also, scratching could lead to an infection. If itching is a problem, use a blow dryer on a cool setting to relieve discomfort.  Do not trim or cut your cast or remove padding from inside of it.  Exercise all joints next to the injury that are not immobilized by the cast or splint. For example, if you have a long leg cast, exercise the hip joint and toes. If you have an arm cast or splint, exercise the shoulder, elbow, thumb, and fingers.  Elevate your injured arm or leg on 1 or 2 pillows for the first 1 to 3 days to decrease swelling and pain.It is best if you can comfortably elevate your cast so it is higher than your heart. SEEK MEDICAL CARE IF:   Your cast or splint cracks.  Your cast or splint is too tight or too loose.  You have unbearable itching inside the cast.  Your cast becomes wet or develops a soft spot or area.  You have a bad smell coming from inside your cast.  You get an object stuck under your cast.  Your skin around the cast becomes red or raw.  You have new pain or worsening pain after the cast has been applied. SEEK IMMEDIATE MEDICAL CARE IF:   You have fluid leaking through the cast.  You are unable to move your fingers or toes.  You have discolored (blue or white), cool, painful, or very swollen fingers or toes beyond the cast.  You have tingling or numbness around the injured area.  You have severe pain or pressure under the cast.  You have  any difficulty with your breathing or have shortness of breath.  You have chest pain. Document Released: 09/29/2000 Document Revised: 07/23/2013 Document Reviewed: 04/10/2013 [facility] Patient Information 2015 [place], [place]. This information is not intended to replace advice given to you by your health care provider. Make sure you discuss any questions you have with your health care provider.

## 2014-12-01 NOTE — ED Provider Notes (Signed)
CSN: [id number]     Arrival date & time 12/01/14  1051 History   First MD Initiated Contact with Patient 12/01/14 1202     Chief Complaint  Patient presents with  . Fall  . Wrist Injury     (Consider location/radiation/quality/duration/timing/severity/associated sxs/prior Treatment) HPI Patient is a 34 year old male who presents the ER after a fall. Patient reports falling twice yesterday while playing indoor soccer, landing on cement flooring. Patient states he fell with outstretched hand, landing on his left wrist. Patient reports persistent pain since last night with some mild swelling. Patient denies numbness, weakness, tingling, loss of function or sensation. Patient states the pain has been constant, is in his left radial portion of his wrist, is an 8 out of 10, does not radiate.   Past Medical History  Diagnosis Date  . Back pain   . Reflux   . Esophageal stricture 03/2014  . Hiatal hernia   . Ulcerative esophagitis 03/2014    severe  . Anxiety   . GERD (gastroesophageal reflux disease)   . Anemia 10/23/2014   Past Surgical History  Procedure Laterality Date  . Hand surgery Right   . Esophageal dilation      On 3 occasions: 03/2014, 08/2014, 09/2014   Family History  Problem Relation Age of Onset  . Hypertension Mother   . Hypertension Father   . Colon cancer Neg Hx   . Esophageal cancer Neg Hx   . Rectal cancer Neg Hx   . Stomach cancer Neg Hx    History  Substance Use Topics  . Smoking status: Never Smoker   . Smokeless tobacco: Never Used  . Alcohol Use: No    Review of Systems  Musculoskeletal: Positive for arthralgias.  Neurological: Negative for weakness and numbness.      Allergies  Review of patient's allergies indicates no known allergies.  Home Medications   Prior to Admission medications   Medication Sig Start Date End Date Taking? Authorizing Provider  clonazePAM (KLONOPIN) 0.5 MG tablet Take 1 tablet (0.5 mg total) by mouth 2 (two) times  daily as needed for anxiety. 10/28/14   Eulas Post, MD  HYDROcodone-acetaminophen (NORCO/VICODIN) 5-325 MG per tablet Take 1-2 tablets by mouth every 6 (six) hours as needed for moderate pain or severe pain. 12/01/14   Carrie Mew, PA-C  MOVIPREP 100 G SOLR Take 1 kit (200 g total) by mouth once. 11/13/14   Ladene Artist, MD  nortriptyline (PAMELOR) 25 MG capsule Take 1 capsule (25 mg total) by mouth at bedtime. 10/28/14   Lyndal Pulley, DO  pantoprazole (PROTONIX) 40 MG tablet TAKE 1 TABLET (40 MG TOTAL) BY MOUTH 2 (TWO) TIMES DAILY. 10/12/14   Eulas Post, MD  tiZANidine (ZANAFLEX) 4 MG tablet Take 1 tablet (4 mg total) by mouth every 8 (eight) hours as needed for muscle spasms. 10/21/14   Lyndal Pulley, DO  traMADol (ULTRAM) 50 MG tablet Take 1 tablet (50 mg total) by mouth 2 (two) times daily. 10/28/14   Lyndal Pulley, DO   BP 123/71 mmHg  Pulse 91  Temp(Src) 98.5 F (36.9 C) (Oral)  Resp 18  Ht 6' (1.829 m)  Wt 173 lb (78.472 kg)  BMI 23.46 kg/m2  SpO2 100% Physical Exam  Constitutional: He appears well-developed and well-nourished. No distress.  HENT:  Head: Normocephalic and atraumatic.  Eyes: Conjunctivae are normal. Right eye exhibits no discharge. Left eye exhibits no discharge. No scleral icterus.  Cardiovascular:  Peripheral pulses intact at injured extremity.   Pulmonary/Chest: Effort normal. No respiratory distress.  Musculoskeletal:  Left wrist exam: Mild swelling noted to left radial portion of wrist. No erythema, ecchymosis, deformity noted. Tenderness noted over anatomic snuffbox. 5 out of 5 motor strength at elbow, wrist, grip strength. Radial pulse 2+. Capillary refill less than 2 seconds distally. Distal sensation intact. Patient is full range of motion of his wrist, fingers.  Neurological: He is alert.  No numbness distal to injury.    Skin: Skin is warm and dry. No rash noted. He is not diaphoretic.  Nursing note and vitals reviewed.   ED  Course  Procedures (including critical care time) Labs Review Labs Reviewed - No data to display  Imaging Review Dg Wrist Complete Left  12/01/2014   CLINICAL DATA:  Fall last night playing soccer, lateral wrist pain  EXAM: LEFT WRIST - COMPLETE 3+ VIEW  COMPARISON:  None.  FINDINGS: Four views of the left wrist submitted. No acute fracture or subluxation. No radiopaque foreign body.  IMPRESSION: Negative.   Electronically Signed   By: Lahoma Crocker M.D.   On: 12/01/2014 11:16     EKG Interpretation None      MDM   Final diagnoses:  Left wrist pain    Patient here with wrist injury and exquisite tenderness located solely over his left anatomic snuffbox. Patient neurovascularly intact. Radiographs unremarkable for acute pathology, however with patient's snuffbox tenderness we'll place patient in a thumb spica splint and have him follow-up with orthopedics in the event of a radial occult fracture to his scaphoid. I discussed the importance of follow-up with orthopedics for this injury, and advised patient that in the event of an occult fracture, without proper follow-up peaked develop severe osteoarthritis and possibly osteonecrosis. I discussed return precautions with patient, and patient verbalizes understanding and agreement of this plan. I encouraged him call or return to the ER should he have any questions or concerns.  BP 123/71 mmHg  Pulse 91  Temp(Src) 98.5 F (36.9 C) (Oral)  Resp 18  Ht 6' (1.829 m)  Wt 173 lb (78.472 kg)  BMI 23.46 kg/m2  SpO2 100%  Signed,  Dahlia Bailiff, PA-C 12:31 AM      Carrie Mew, PA-C 12/02/14 5284  Varney Biles, MD 12/02/14 1539

## 2014-12-01 NOTE — ED Notes (Signed)
[place] last night while playing indoor soccer. Landed on cement flooring. Injury to his left wrist with pain radiating into his elbow.

## 2014-12-05 ENCOUNTER — Encounter (HOSPITAL_COMMUNITY): Payer: Self-pay | Admitting: Emergency Medicine

## 2014-12-05 ENCOUNTER — Emergency Department (HOSPITAL_COMMUNITY): Payer: 59

## 2014-12-05 ENCOUNTER — Emergency Department (HOSPITAL_COMMUNITY)
Admission: EM | Admit: 2014-12-05 | Discharge: 2014-12-06 | Disposition: A | Discharge status: Home / Self Care | Payer: 59 | Attending: Emergency Medicine | Admitting: Emergency Medicine

## 2014-12-05 DIAGNOSIS — Z862 Personal history of diseases of the blood and blood-forming organs and certain disorders involving the immune mechanism: Secondary | ICD-10-CM | POA: Diagnosis not present

## 2014-12-05 DIAGNOSIS — F419 Anxiety disorder, unspecified: Secondary | ICD-10-CM | POA: Insufficient documentation

## 2014-12-05 DIAGNOSIS — K219 Gastro-esophageal reflux disease without esophagitis: Secondary | ICD-10-CM | POA: Insufficient documentation

## 2014-12-05 DIAGNOSIS — Z8739 Personal history of other diseases of the musculoskeletal system and connective tissue: Secondary | ICD-10-CM | POA: Insufficient documentation

## 2014-12-05 DIAGNOSIS — R197 Diarrhea, unspecified: Secondary | ICD-10-CM | POA: Insufficient documentation

## 2014-12-05 DIAGNOSIS — Z79899 Other long term (current) drug therapy: Secondary | ICD-10-CM | POA: Diagnosis not present

## 2014-12-05 DIAGNOSIS — R112 Nausea with vomiting, unspecified: Secondary | ICD-10-CM | POA: Diagnosis not present

## 2014-12-05 DIAGNOSIS — R109 Unspecified abdominal pain: Secondary | ICD-10-CM | POA: Diagnosis present

## 2014-12-05 DIAGNOSIS — R1013 Epigastric pain: Secondary | ICD-10-CM | POA: Diagnosis not present

## 2014-12-05 LAB — COMPREHENSIVE METABOLIC PANEL
ALK PHOS: 85 U/L (ref 39–117)
ALT: 17 U/L (ref 0–53)
AST: 21 U/L (ref 0–37)
Albumin: 4.2 g/dL (ref 3.5–5.2)
Anion gap: 10 (ref 5–15)
BILIRUBIN TOTAL: 0.7 mg/dL (ref 0.3–1.2)
BUN: 9 mg/dL (ref 6–23)
CO2: 25 mmol/L (ref 19–32)
CREATININE: 0.97 mg/dL (ref 0.50–1.35)
Calcium: 9.7 mg/dL (ref 8.4–10.5)
Chloride: 105 mmol/L (ref 96–112)
GFR calc Af Amer: >90 mL/min (ref >90)
GFR calc non Af Amer: >90 mL/min (ref >90)
Glucose, Bld: 121 mg/dL — ABNORMAL HIGH (ref 70–99)
Potassium: 3.9 mmol/L (ref 3.5–5.1)
SODIUM: 140 mmol/L (ref 135–145)
TOTAL PROTEIN: 7.2 g/dL (ref 6.0–8.3)

## 2014-12-05 LAB — CBC WITH DIFFERENTIAL/PLATELET
BASOS ABS: 0 10*3/uL (ref 0.0–0.1)
Basophils Relative: 0 % (ref 0–1)
EOS ABS: 0 10*3/uL (ref 0.0–0.7)
Eosinophils Relative: 0 % (ref 0–5)
HEMATOCRIT: 42.5 % (ref 39.0–52.0)
Hemoglobin: 13.2 g/dL (ref 13.0–17.0)
LYMPHS ABS: 0.7 10*3/uL (ref 0.7–4.0)
Lymphocytes Relative: 10 % — ABNORMAL LOW (ref 12–46)
MCH: 24.1 pg — ABNORMAL LOW (ref 26.0–34.0)
MCHC: 31.1 g/dL (ref 30.0–36.0)
MCV: 77.7 fL — AB (ref 78.0–100.0)
MONO ABS: 0.2 10*3/uL (ref 0.1–1.0)
Monocytes Relative: 3 % (ref 3–12)
Neutro Abs: 5.9 10*3/uL (ref 1.7–7.7)
Neutrophils Relative %: 87 % — ABNORMAL HIGH (ref 43–77)
Platelets: 306 10*3/uL (ref 150–400)
RBC: 5.47 MIL/uL (ref 4.22–5.81)
RDW: 28.1 % — ABNORMAL HIGH (ref 11.5–15.5)
WBC: 6.8 10*3/uL (ref 4.0–10.5)

## 2014-12-05 LAB — LIPASE, BLOOD: LIPASE: 23 U/L (ref 11–59)

## 2014-12-05 MED ORDER — GI COCKTAIL ~~LOC~~
30.0000 mL | Freq: Once | ORAL | Status: AC
  Administered 2014-12-05: 30 mL via ORAL
  Filled 2014-12-05: qty 30

## 2014-12-05 MED ORDER — ONDANSETRON HCL 4 MG/2ML IJ SOLN
4.0000 mg | Freq: Once | INTRAMUSCULAR | Status: AC
  Administered 2014-12-05: 4 mg via INTRAVENOUS
  Filled 2014-12-05: qty 2

## 2014-12-05 MED ORDER — HYDROMORPHONE HCL 1 MG/ML IJ SOLN
1.0000 mg | INTRAMUSCULAR | Status: AC
  Administered 2014-12-05: 1 mg via INTRAVENOUS
  Filled 2014-12-05: qty 1

## 2014-12-05 MED ORDER — IOHEXOL 300 MG/ML  SOLN
25.0000 mL | Freq: Once | INTRAMUSCULAR | Status: AC | PRN
  Administered 2014-12-05: 25 mL via ORAL

## 2014-12-05 MED ORDER — HYDROMORPHONE HCL 1 MG/ML IJ SOLN
1.0000 mg | Freq: Once | INTRAMUSCULAR | Status: AC
  Administered 2014-12-05: 1 mg via INTRAVENOUS
  Filled 2014-12-05: qty 1

## 2014-12-05 MED ORDER — IOHEXOL 300 MG/ML  SOLN
100.0000 mL | Freq: Once | INTRAMUSCULAR | Status: AC | PRN
  Administered 2014-12-05: 100 mL via INTRAVENOUS

## 2014-12-05 MED ORDER — SODIUM CHLORIDE 0.9 % IV BOLUS (SEPSIS)
1000.0000 mL | INTRAVENOUS | Status: AC
  Administered 2014-12-05: 1000 mL via INTRAVENOUS

## 2014-12-05 NOTE — ED Notes (Signed)
Patient transported to CT 

## 2014-12-05 NOTE — ED Notes (Signed)
finished with Oral CT contrast.

## 2014-12-05 NOTE — ED Provider Notes (Signed)
CSN: [id number]     Arrival date & time 12/05/14  2048 History   First MD Initiated Contact with Patient 12/05/14 2054     Chief Complaint  Patient presents with  . Abdominal Pain     (Consider location/radiation/quality/duration/timing/severity/associated sxs/prior Treatment) Patient is a 34 y.o. male presenting with abdominal pain. The history is provided by the patient.  Abdominal Pain Pain location:  Epigastric Pain quality: aching   Pain radiation: slightly upwards to chest. Pain severity:  Moderate Onset quality:  Sudden Timing:  Constant Progression:  Unchanged Chronicity:  New Context comment:  At rest Relieved by:  Nothing Worsened by:  Nothing tried Ineffective treatments:  None tried Associated symptoms: diarrhea, nausea and vomiting   Associated symptoms: no chest pain, no cough, no dysuria, no fever, no hematuria and no shortness of breath     Past Medical History  Diagnosis Date  . Back pain   . Reflux   . Esophageal stricture 03/2014  . Hiatal hernia   . Ulcerative esophagitis 03/2014    severe  . Anxiety   . GERD (gastroesophageal reflux disease)   . Anemia 10/23/2014   Past Surgical History  Procedure Laterality Date  . Hand surgery Right   . Esophageal dilation      On 3 occasions: 03/2014, 08/2014, 09/2014   Family History  Problem Relation Age of Onset  . Hypertension Mother   . Hypertension Father   . Colon cancer Neg Hx   . Esophageal cancer Neg Hx   . Rectal cancer Neg Hx   . Stomach cancer Neg Hx    History  Substance Use Topics  . Smoking status: Never Smoker   . Smokeless tobacco: Never Used  . Alcohol Use: No    Review of Systems  Constitutional: Negative for fever.  HENT: Negative for drooling and rhinorrhea.   Eyes: Negative for pain.  Respiratory: Negative for cough and shortness of breath.   Cardiovascular: Negative for chest pain and leg swelling.  Gastrointestinal: Positive for nausea, vomiting, abdominal pain and  diarrhea.  Genitourinary: Negative for dysuria and hematuria.  Musculoskeletal: Negative for gait problem and neck pain.  Skin: Negative for color change.  Neurological: Negative for numbness and headaches.  Hematological: Negative for adenopathy.  Psychiatric/Behavioral: Negative for behavioral problems.  All other systems reviewed and are negative.     Allergies  Review of patient's allergies indicates no known allergies.  Home Medications   Prior to Admission medications   Medication Sig Start Date End Date Taking? Authorizing Provider  clonazePAM (KLONOPIN) 0.5 MG tablet Take 1 tablet (0.5 mg total) by mouth 2 (two) times daily as needed for anxiety. 10/28/14   Eulas Post, MD  HYDROcodone-acetaminophen (NORCO/VICODIN) 5-325 MG per tablet Take 1-2 tablets by mouth every 6 (six) hours as needed for moderate pain or severe pain. 12/01/14   Carrie Mew, PA-C  MOVIPREP 100 G SOLR Take 1 kit (200 g total) by mouth once. 11/13/14   Ladene Artist, MD  nortriptyline (PAMELOR) 25 MG capsule Take 1 capsule (25 mg total) by mouth at bedtime. 10/28/14   Lyndal Pulley, DO  pantoprazole (PROTONIX) 40 MG tablet TAKE 1 TABLET (40 MG TOTAL) BY MOUTH 2 (TWO) TIMES DAILY. 10/12/14   Eulas Post, MD  tiZANidine (ZANAFLEX) 4 MG tablet Take 1 tablet (4 mg total) by mouth every 8 (eight) hours as needed for muscle spasms. 10/21/14   Lyndal Pulley, DO  traMADol (ULTRAM) 50 MG tablet  Take 1 tablet (50 mg total) by mouth 2 (two) times daily. 10/28/14   Lyndal Pulley, DO   BP 124/63 mmHg  Pulse 78  Temp(Src) 98.3 F (36.8 C) (Oral)  Resp 18  Ht 6' (1.829 m)  Wt 173 lb (78.472 kg)  BMI 23.46 kg/m2  SpO2 100% Physical Exam  Constitutional: He is oriented to person, place, and time. He appears well-developed and well-nourished.  HENT:  Head: Normocephalic and atraumatic.  Right Ear: External ear normal.  Left Ear: External ear normal.  Nose: Nose normal.  Mouth/Throat: Oropharynx is  clear and moist. No oropharyngeal exudate.  Eyes: Conjunctivae and EOM are normal. Pupils are equal, round, and reactive to light.  Neck: Normal range of motion. Neck supple.  Cardiovascular: Normal rate, regular rhythm, normal heart sounds and intact distal pulses.  Exam reveals no gallop and no friction rub.   No murmur heard. Pulmonary/Chest: Effort normal and breath sounds normal. No respiratory distress. He has no wheezes.  Abdominal: Soft. Bowel sounds are normal. He exhibits no distension. There is tenderness (mild to mod ttp of epig area). There is no rebound and no guarding.  Musculoskeletal: Normal range of motion. He exhibits no edema or tenderness.  Neurological: He is alert and oriented to person, place, and time.  Skin: Skin is warm and dry.  Psychiatric: He has a normal mood and affect. His behavior is normal.  Nursing note and vitals reviewed.   ED Course  Procedures (including critical care time) Labs Review Labs Reviewed  CBC WITH DIFFERENTIAL/PLATELET - Abnormal; Notable for the following:    MCV 77.7 (*)    MCH 24.1 (*)    RDW 28.1 (*)    Neutrophils Relative % 87 (*)    Lymphocytes Relative 10 (*)    All other components within normal limits  COMPREHENSIVE METABOLIC PANEL - Abnormal; Notable for the following:    Glucose, Bld 121 (*)    All other components within normal limits  LIPASE, BLOOD    Imaging Review Dg Chest 2 View  12/05/2014   CLINICAL DATA:  Upper abdominal pain radiating to lower chest  EXAM: CHEST  2 VIEW  COMPARISON:  10/23/2014  FINDINGS: The heart size and mediastinal contours are within normal limits. Both lungs are clear. The visualized skeletal structures are unremarkable.  IMPRESSION: No active cardiopulmonary disease.   Electronically Signed   By: Andreas Newport M.D.   On: 12/05/2014 22:03   Ct Abdomen Pelvis W Contrast  12/05/2014   CLINICAL DATA:  Mid upper to umbilical abd pain with n/v/d since 1400 today.  EXAM: CT ABDOMEN AND  PELVIS WITH CONTRAST  TECHNIQUE: Multidetector CT imaging of the abdomen and pelvis was performed using the standard protocol following bolus administration of intravenous contrast.  CONTRAST:  132m OMNIPAQUE IOHEXOL 300 MG/ML  SOLN  COMPARISON:  CT, 11/13/2014.  FINDINGS: Clear lung bases.  Heart normal in size.  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 Liver otherwise unremarkable.  Spleen, gallbladder, pancreas, adrenal glands, kidneys, ureters, bladder: Normal.  No adenopathy.  No ascites.  Colon and small bowel are unremarkable.  Normal appendix visualized.  No significant bony abnormality.  IMPRESSION: 1. No acute findings. No findings to explain this patient's symptoms. 2. Normal appendix visualized. 3. 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 This is stable.   Electronically Signed  By: Lajean Manes M.D.   On: 12/05/2014 23:40     EKG Interpretation None      MDM   Final diagnoses:  Epigastric pain  Abdominal pain    9:22 PM 34 y.o. male with a history of esophagitis, esophageal strictures status post dilation who presents with epigastric pain which began briskly around 11 AM this morning and has persisted ever since. After the pain developed also had some diarrhea and emesis. He denies seeing any blood in his stool or vomit. He denies any fevers. He did have a colonoscopy and endoscopy this month. The colonoscopy was unremarkable and the endoscopy showed esophagitis and a stricture which was dilated. He is afebrile and vital signs are unremarkable here. Will get symptom control and CT scan.   I interpreted/reviewed the labs and/or imaging which were non-contributory. Pt feeling better. He states that he does not tolerate percocet/nsaids well. He does have a previous visit for opiate detox last year but he denies this and states he has no hx of  opiate addiction. While I find this suspicious, he does have real pathology (erosive esophagitis/esophageal strictures) and may not tolerate nsaids/percocet well. Will provide him a few tablets of dilaudid by Rx until he can f/u w/ his pcp next week.  I have discussed the diagnosis/risks/treatment options with the patient and believe the pt to be eligible for discharge home to follow-up with his pcp next week. We also discussed returning to the ED immediately if new or worsening sx occur. We discussed the sx which are most concerning (e.g., worsening pain, fever, bloody emesis/stool) that necessitate immediate return. Medications administered to the patient during their visit and any new prescriptions provided to the patient are listed below.  Medications given during this visit Medications  HYDROmorphone (DILAUDID) injection 1 mg (1 mg Intravenous Given 12/05/14 2123)  sodium chloride 0.9 % bolus 1,000 mL (0 mLs Intravenous Stopped 12/05/14 2236)  ondansetron (ZOFRAN) injection 4 mg (4 mg Intravenous Given 12/05/14 2124)  iohexol (OMNIPAQUE) 300 MG/ML solution 25 mL (25 mLs Oral Contrast Given 12/05/14 2137)  HYDROmorphone (DILAUDID) injection 1 mg (1 mg Intravenous Given 12/05/14 2239)  iohexol (OMNIPAQUE) 300 MG/ML solution 100 mL (100 mLs Intravenous Contrast Given 12/05/14 2313)  HYDROmorphone (DILAUDID) injection 1 mg (1 mg Intravenous Given 12/05/14 2349)  gi cocktail (Maalox,Lidocaine,Donnatal) (30 mLs Oral Given 12/05/14 2347)    Discharge Medication List as of 12/06/2014 12:29 AM    START taking these medications   Details  HYDROmorphone (DILAUDID) 2 MG tablet Take 0.5 tablets (1 mg total) by mouth every 6 (six) hours as needed for moderate pain or severe pain., Starting 12/06/2014, Until Discontinued, Print    ondansetron (ZOFRAN ODT) 4 MG disintegrating tablet 49m ODT q4 hours prn nausea/vomit, Print          FPamella Pert MD 12/06/14 1152

## 2014-12-05 NOTE — ED Notes (Signed)
Pt presents with mid abdominal pain onset 4 hours ago- admits to N/V/D associated with symptoms.  Pt pale and noted to have near syncopal episode in triage- assisted to wheelchair- pt admits to feeling weak at present.  Currently alert and oriented X 4 at present.

## 2014-12-05 NOTE — ED Notes (Signed)
Family at bedside. 

## 2014-12-06 MED ORDER — ONDANSETRON 4 MG PO TBDP: ORAL_TABLET | ORAL | Status: DC

## 2014-12-06 MED ORDER — HYDROMORPHONE HCL 2 MG PO TABS: 1.0000 mg | ORAL_TABLET | Freq: Four times a day (QID) | ORAL | Status: DC | PRN

## 2014-12-06 NOTE — Discharge Instructions (Signed)

## 2014-12-07 ENCOUNTER — Telehealth: Payer: Self-pay | Admitting: Family Medicine

## 2014-12-07 NOTE — Telephone Encounter (Signed)
[place] pt said he  need a ER FUP today. Let me know if he need to be seen sooner I have him scheduled for Wednesday 12/09/14

## 2014-12-09 ENCOUNTER — Ambulatory Visit: Payer: 59 | Admitting: Family Medicine

## 2015-01-15 DIAGNOSIS — Z5189 Encounter for other specified aftercare: Secondary | ICD-10-CM

## 2015-01-15 HISTORY — DX: Encounter for other specified aftercare: Z51.89

## 2015-01-18 ENCOUNTER — Telehealth: Payer: Self-pay | Admitting: Gastroenterology

## 2015-01-18 ENCOUNTER — Emergency Department (HOSPITAL_COMMUNITY): Payer: 59 | Admitting: Anesthesiology

## 2015-01-18 ENCOUNTER — Encounter (HOSPITAL_COMMUNITY): Payer: Self-pay | Admitting: Emergency Medicine

## 2015-01-18 ENCOUNTER — Ambulatory Visit (HOSPITAL_COMMUNITY): Admit: 2015-01-18 | Payer: Self-pay | Admitting: Internal Medicine

## 2015-01-18 ENCOUNTER — Ambulatory Visit: Payer: 59 | Admitting: Family Medicine

## 2015-01-18 ENCOUNTER — Encounter (HOSPITAL_COMMUNITY)
Admission: EM | Disposition: A | Discharge status: Home / Self Care | Payer: 59 | Source: Home / Self Care | Attending: Emergency Medicine

## 2015-01-18 ENCOUNTER — Encounter (HOSPITAL_COMMUNITY)
Admission: EM | Disposition: A | Discharge status: Home / Self Care | Payer: Self-pay | Source: Home / Self Care | Attending: Emergency Medicine

## 2015-01-18 ENCOUNTER — Ambulatory Visit (HOSPITAL_COMMUNITY)
Admission: EM | Admit: 2015-01-18 | Discharge: 2015-01-18 | Disposition: A | Discharge status: Home / Self Care | Payer: 59 | Attending: Emergency Medicine | Admitting: Emergency Medicine

## 2015-01-18 DIAGNOSIS — K209 Esophagitis, unspecified without bleeding: Secondary | ICD-10-CM | POA: Insufficient documentation

## 2015-01-18 DIAGNOSIS — T18128A Food in esophagus causing other injury, initial encounter: Secondary | ICD-10-CM | POA: Diagnosis not present

## 2015-01-18 DIAGNOSIS — R1310 Dysphagia, unspecified: Secondary | ICD-10-CM

## 2015-01-18 DIAGNOSIS — R0989 Other specified symptoms and signs involving the circulatory and respiratory systems: Secondary | ICD-10-CM

## 2015-01-18 DIAGNOSIS — K219 Gastro-esophageal reflux disease without esophagitis: Secondary | ICD-10-CM | POA: Diagnosis not present

## 2015-01-18 DIAGNOSIS — K449 Diaphragmatic hernia without obstruction or gangrene: Secondary | ICD-10-CM | POA: Diagnosis not present

## 2015-01-18 DIAGNOSIS — K222 Esophageal obstruction: Secondary | ICD-10-CM | POA: Diagnosis not present

## 2015-01-18 HISTORY — PX: ESOPHAGOGASTRODUODENOSCOPY (EGD) WITH PROPOFOL: SHX5813

## 2015-01-18 SURGERY — ESOPHAGOGASTRODUODENOSCOPY (EGD) WITH PROPOFOL
Anesthesia: General

## 2015-01-18 SURGERY — EGD (ESOPHAGOGASTRODUODENOSCOPY)
Anesthesia: Monitor Anesthesia Care

## 2015-01-18 MED ORDER — PROPOFOL 10 MG/ML IV BOLUS
INTRAVENOUS | Status: DC | PRN
  Administered 2015-01-18: 200 mg via INTRAVENOUS

## 2015-01-18 MED ORDER — PROPOFOL 10 MG/ML IV BOLUS
INTRAVENOUS | Status: AC
  Filled 2015-01-18: qty 20

## 2015-01-18 MED ORDER — FENTANYL CITRATE 0.05 MG/ML IJ SOLN
INTRAMUSCULAR | Status: AC
  Filled 2015-01-18: qty 2

## 2015-01-18 MED ORDER — FENTANYL CITRATE 0.05 MG/ML IJ SOLN
INTRAMUSCULAR | Status: DC | PRN
  Administered 2015-01-18: 100 ug via INTRAVENOUS

## 2015-01-18 MED ORDER — MIDAZOLAM HCL 2 MG/2ML IJ SOLN
INTRAMUSCULAR | Status: AC
  Filled 2015-01-18: qty 2

## 2015-01-18 MED ORDER — MIDAZOLAM HCL 5 MG/5ML IJ SOLN
INTRAMUSCULAR | Status: DC | PRN
  Administered 2015-01-18: 2 mg via INTRAVENOUS

## 2015-01-18 MED ORDER — LIDOCAINE HCL (CARDIAC) 20 MG/ML IV SOLN
INTRAVENOUS | Status: DC | PRN
  Administered 2015-01-18: 50 mg via INTRAVENOUS

## 2015-01-18 MED ORDER — LACTATED RINGERS IV SOLN
INTRAVENOUS | Status: DC | PRN
Start: 2015-01-18 — End: 2015-01-18
  Administered 2015-01-18: 13:00:00 via INTRAVENOUS

## 2015-01-18 MED ORDER — SODIUM CHLORIDE 0.9 % IV SOLN: INTRAVENOUS | Status: DC

## 2015-01-18 MED ORDER — ONDANSETRON HCL 4 MG/2ML IJ SOLN
INTRAMUSCULAR | Status: DC | PRN
  Administered 2015-01-18: 4 mg via INTRAVENOUS

## 2015-01-18 MED ORDER — SUCCINYLCHOLINE CHLORIDE 20 MG/ML IJ SOLN
INTRAMUSCULAR | Status: DC | PRN
  Administered 2015-01-18: 100 mg via INTRAVENOUS

## 2015-01-18 MED ORDER — ONDANSETRON HCL 4 MG/2ML IJ SOLN
INTRAMUSCULAR | Status: AC
  Filled 2015-01-18: qty 2

## 2015-01-18 MED ORDER — LIDOCAINE HCL (CARDIAC) 20 MG/ML IV SOLN
INTRAVENOUS | Status: AC
  Filled 2015-01-18: qty 5

## 2015-01-18 SURGICAL SUPPLY — 14 items

## 2015-01-18 NOTE — ED Notes (Signed)
GI PA at bedside for eval

## 2015-01-18 NOTE — ED Notes (Signed)
Pt with Hx of esophageal stricture states he got his dinner caught in his throat about 14 hours ago and has not been able to swallow food or water since that time. Pt states in the past he has had to have his esophagus dilated when he feels this way. Pt able to speak freely, control oral secretions, and is in no signs of respiratory distress at this time.

## 2015-01-18 NOTE — Anesthesia Procedure Notes (Signed)
Procedure Name: Intubation Performed by: Noralyn Pick D Pre-anesthesia Checklist: Patient identified, Emergency Drugs available, Suction available and Patient being monitored Patient Re-evaluated:Patient Re-evaluated prior to inductionOxygen Delivery Method: Circle System Utilized Preoxygenation: Pre-oxygenation with 100% oxygen Intubation Type: IV induction Ventilation: Mask ventilation without difficulty Laryngoscope Size: Mac and 4 Grade View: Grade II Tube type: Oral Number of attempts: 1 Airway Equipment and Method: Stylet and Oral airway Placement Confirmation: ETT inserted through vocal cords under direct vision,  positive ETCO2 and breath sounds checked- equal and bilateral Secured at: 22 cm Tube secured with: Tape Dental Injury: Teeth and Oropharynx as per pre-operative assessment

## 2015-01-18 NOTE — Transfer of Care (Signed)
Immediate Anesthesia Transfer of Care Note  Patient: Eric Hood  Procedure(s) Performed: Procedure(s): ESOPHAGOGASTRODUODENOSCOPY (EGD) WITH PROPOFOL (N/A)  Patient Location: PACU  Anesthesia Type:General  Level of Consciousness: awake, alert  and oriented  Airway & Oxygen Therapy: Patient Spontanous Breathing and Patient connected to face mask oxygen  Post-op Assessment: Report given to RN and Post -op Vital signs reviewed and stable  Post vital signs: Reviewed and stable  Last Vitals:  Filed Vitals:   01/18/15 1213  BP: 115/68  Pulse: 84  Temp: 36.8 C  Resp: 20    Complications: No apparent anesthesia complications

## 2015-01-18 NOTE — Telephone Encounter (Signed)
Patient reports that for the last 12 hours he has not been able to eat or drink anything.  Not able to swallow at all. "nothing will go down".  He is advised that he will need to go to the ER for eval.  Patient verbalized understanding of instructions.

## 2015-01-18 NOTE — H&P (View-Only) (Signed)
Referring Provider: No ref. provider found Primary Care Physician:  Eulas Post, MD Primary Gastroenterologist:  Dr. Fuller Plan   Reason for Consultation:  Dysphagia; ? Food impaction  HPI: Eric Hood is a 34 y.o. male with history of esophageal stricture on EGD in 11/2014 by Dr. Fuller Plan that was dilated.  Also had 5 cm hiatal hernia and class C esophagitis at that time.  Is now on pantoprazole 40 mg BID and says that he is having absolutely no heartburn or reflux like he did previously.  Dilation seemed to help just until this past week.  The past couple of days he's had some swallowing issues again.  Was eating spaghetti last night (no meat) and it got stuck.  Says it still feels like it is there and nothing else will go down.  Even spitting up his saliva every 15 minutes or so when it seems to be backed up.   Past Medical History  Diagnosis Date  . Back pain   . Reflux   . Esophageal stricture 03/2014  . Hiatal hernia   . Ulcerative esophagitis 03/2014    severe  . Anxiety   . GERD (gastroesophageal reflux disease)   . Anemia 10/23/2014    Past Surgical History  Procedure Laterality Date  . Hand surgery Right   . Esophageal dilation      On 3 occasions: 03/2014, 08/2014, 09/2014    Prior to Admission medications   Medication Sig Start Date End Date Taking? Authorizing Provider  pantoprazole (PROTONIX) 40 MG tablet TAKE 1 TABLET (40 MG TOTAL) BY MOUTH 2 (TWO) TIMES DAILY. 10/12/14  Yes Eulas Post, MD  clonazePAM (KLONOPIN) 0.5 MG tablet Take 1 tablet (0.5 mg total) by mouth 2 (two) times daily as needed for anxiety. Patient not taking: Reported on 01/18/2015 10/28/14   Eulas Post, MD  HYDROcodone-acetaminophen (NORCO/VICODIN) 5-325 MG per tablet Take 1-2 tablets by mouth every 6 (six) hours as needed for moderate pain or severe pain. Patient not taking: Reported on 01/18/2015 12/01/14   Dahlia Bailiff, PA-C  HYDROmorphone (DILAUDID) 2 MG tablet Take 0.5 tablets (1 mg  total) by mouth every 6 (six) hours as needed for moderate pain or severe pain. Patient not taking: Reported on 01/18/2015 12/06/14   Pamella Pert, MD  MOVIPREP 100 G SOLR Take 1 kit (200 g total) by mouth once. Patient not taking: Reported on 12/05/2014 11/13/14   Ladene Artist, MD  nortriptyline (PAMELOR) 25 MG capsule Take 1 capsule (25 mg total) by mouth at bedtime. Patient not taking: Reported on 12/05/2014 10/28/14   Lyndal Pulley, DO  ondansetron (ZOFRAN ODT) 4 MG disintegrating tablet 31m ODT q4 hours prn nausea/vomit Patient not taking: Reported on 01/18/2015 12/06/14   FPamella Pert MD  tiZANidine (ZANAFLEX) 4 MG tablet Take 1 tablet (4 mg total) by mouth every 8 (eight) hours as needed for muscle spasms. Patient not taking: Reported on 12/05/2014 10/21/14   ZLyndal Pulley DO  traMADol (ULTRAM) 50 MG tablet Take 1 tablet (50 mg total) by mouth 2 (two) times daily. Patient not taking: Reported on 12/05/2014 10/28/14   ZLyndal Pulley DO    Current Facility-Administered Medications  Medication Dose Route Frequency Provider Last Rate Last Dose  . 0.9 %  sodium chloride infusion   Intravenous Continuous JLoralie Champagne PA-C       Current Outpatient Prescriptions  Medication Sig Dispense Refill  . pantoprazole (PROTONIX) 40 MG tablet TAKE 1 TABLET (40 MG  TOTAL) BY MOUTH 2 (TWO) TIMES DAILY. 60 tablet 3  . clonazePAM (KLONOPIN) 0.5 MG tablet Take 1 tablet (0.5 mg total) by mouth 2 (two) times daily as needed for anxiety. (Patient not taking: Reported on 01/18/2015) 30 tablet 0  . HYDROcodone-acetaminophen (NORCO/VICODIN) 5-325 MG per tablet Take 1-2 tablets by mouth every 6 (six) hours as needed for moderate pain or severe pain. (Patient not taking: Reported on 01/18/2015) 20 tablet 0  . HYDROmorphone (DILAUDID) 2 MG tablet Take 0.5 tablets (1 mg total) by mouth every 6 (six) hours as needed for moderate pain or severe pain. (Patient not taking: Reported on 01/18/2015) 6 tablet 0  . MOVIPREP  100 G SOLR Take 1 kit (200 g total) by mouth once. (Patient not taking: Reported on 12/05/2014) 1 kit 0  . nortriptyline (PAMELOR) 25 MG capsule Take 1 capsule (25 mg total) by mouth at bedtime. (Patient not taking: Reported on 12/05/2014) 30 capsule 0  . ondansetron (ZOFRAN ODT) 4 MG disintegrating tablet 80m ODT q4 hours prn nausea/vomit (Patient not taking: Reported on 01/18/2015) 20 tablet 0  . tiZANidine (ZANAFLEX) 4 MG tablet Take 1 tablet (4 mg total) by mouth every 8 (eight) hours as needed for muscle spasms. (Patient not taking: Reported on 12/05/2014) 30 tablet 0  . traMADol (ULTRAM) 50 MG tablet Take 1 tablet (50 mg total) by mouth 2 (two) times daily. (Patient not taking: Reported on 12/05/2014) 20 tablet 0    Allergies as of 01/18/2015  . (No Known Allergies)    Family History  Problem Relation Age of Onset  . Hypertension Mother   . Hypertension Father   . Colon cancer Neg Hx   . Esophageal cancer Neg Hx   . Rectal cancer Neg Hx   . Stomach cancer Neg Hx     History   Social History  . Marital Status: Single    Spouse Name: N/A  . Number of Children: N/A  . Years of Education: N/A   Occupational History  . Not on file.   Social History Main Topics  . Smoking status: Never Smoker   . Smokeless tobacco: Never Used  . Alcohol Use: No  . Drug Use: No     Comment: opiate abuse  . Sexual Activity: No   Other Topics Concern  . Not on file   Social History Narrative    Review of Systems: Ten point ROS is O/W negative except as mentioned in HPI.  Physical Exam: Vital signs in last 24 hours: Temp:  [98.3 F (36.8 C)] 98.3 F (36.8 C) (04/04 1213) Pulse Rate:  [84-86] 84 (04/04 1213) Resp:  [16-20] 20 (04/04 1213) BP: (115-116)/(68-74) 115/68 mmHg (04/04 1213) SpO2:  [100 %] 100 % (04/04 1213) Weight:  [180 lb (81.647 kg)] 180 lb (81.647 kg) (04/04 1213)   General:   Alert, Well-developed, well-nourished, pleasant and cooperative in NAD Head:  Normocephalic  and atraumatic. Eyes:  Sclera clear, no icterus.  Conjunctiva pink. Ears:  Normal auditory acuity. Mouth:  No deformity or lesions.  Lungs:  Clear throughout to auscultation.  No wheezes, crackles, or rhonchi.  Heart:  Regular rate and rhythm; no murmurs. Abdomen:  Soft, non-distended.  BS present.  Non-tender. Rectal:  Deferred  Msk:  Symmetrical without gross deformities. Pulses:  Normal pulses noted. Extremities:  Without clubbing or edema. Neurologic:  Alert and  oriented x4;  grossly normal neurologically. Skin:  Intact without significant lesions or rashes. Psych:  Alert and cooperative. Normal mood and  affect.  IMPRESSION:  -34 year old male with history of esophageal stricture on EGD in 11/2014 by Dr. Fuller Plan that was dilated.  Now with possible food impaction.    PLAN: -EGD today rule out impaction.  Likely can be discharged post-procedure.   ZEHR, JESSICA D.  01/18/2015, 12:28 PM  Pager number [phone] Attending MD note:   I have taken a history, examined the patient, and reviewed the chart. I agree with the Advanced Practitioner's impression and recommendations. He has undergone  5 EGD's/dil in last 12 months ( max dil 14 mm), Bx's do not show eosinophillic esophagitis. Currently he able to swallow his saliva. Will proceed with EGD/dil.  [facility] Pager # [phone]

## 2015-01-18 NOTE — Progress Notes (Signed)
Referring Provider: No ref. provider found Primary Care Physician:  Eulas Post, MD Primary Gastroenterologist:  Dr. Fuller Plan   Reason for Consultation:  Dysphagia; ? Food impaction  HPI: Eric Hood is a 34 y.o. male with history of esophageal stricture on EGD in 11/2014 by Dr. Fuller Plan that was dilated.  Also had 5 cm hiatal hernia and class C esophagitis at that time.  Is now on pantoprazole 40 mg BID and says that he is having absolutely no heartburn or reflux like he did previously.  Dilation seemed to help just until this past week.  The past couple of days he's had some swallowing issues again.  Was eating spaghetti last night (no meat) and it got stuck.  Says it still feels like it is there and nothing else will go down.  Even spitting up his saliva every 15 minutes or so when it seems to be backed up.   Past Medical History  Diagnosis Date  . Back pain   . Reflux   . Esophageal stricture 03/2014  . Hiatal hernia   . Ulcerative esophagitis 03/2014    severe  . Anxiety   . GERD (gastroesophageal reflux disease)   . Anemia 10/23/2014    Past Surgical History  Procedure Laterality Date  . Hand surgery Right   . Esophageal dilation      On 3 occasions: 03/2014, 08/2014, 09/2014    Prior to Admission medications   Medication Sig Start Date End Date Taking? Authorizing Provider  pantoprazole (PROTONIX) 40 MG tablet TAKE 1 TABLET (40 MG TOTAL) BY MOUTH 2 (TWO) TIMES DAILY. 10/12/14  Yes Eulas Post, MD  clonazePAM (KLONOPIN) 0.5 MG tablet Take 1 tablet (0.5 mg total) by mouth 2 (two) times daily as needed for anxiety. Patient not taking: Reported on 01/18/2015 10/28/14   Eulas Post, MD  HYDROcodone-acetaminophen (NORCO/VICODIN) 5-325 MG per tablet Take 1-2 tablets by mouth every 6 (six) hours as needed for moderate pain or severe pain. Patient not taking: Reported on 01/18/2015 12/01/14   Dahlia Bailiff, PA-C  HYDROmorphone (DILAUDID) 2 MG tablet Take 0.5 tablets (1 mg  total) by mouth every 6 (six) hours as needed for moderate pain or severe pain. Patient not taking: Reported on 01/18/2015 12/06/14   Pamella Pert, MD  MOVIPREP 100 G SOLR Take 1 kit (200 g total) by mouth once. Patient not taking: Reported on 12/05/2014 11/13/14   Ladene Artist, MD  nortriptyline (PAMELOR) 25 MG capsule Take 1 capsule (25 mg total) by mouth at bedtime. Patient not taking: Reported on 12/05/2014 10/28/14   Lyndal Pulley, DO  ondansetron (ZOFRAN ODT) 4 MG disintegrating tablet 31m ODT q4 hours prn nausea/vomit Patient not taking: Reported on 01/18/2015 12/06/14   FPamella Pert MD  tiZANidine (ZANAFLEX) 4 MG tablet Take 1 tablet (4 mg total) by mouth every 8 (eight) hours as needed for muscle spasms. Patient not taking: Reported on 12/05/2014 10/21/14   ZLyndal Pulley DO  traMADol (ULTRAM) 50 MG tablet Take 1 tablet (50 mg total) by mouth 2 (two) times daily. Patient not taking: Reported on 12/05/2014 10/28/14   ZLyndal Pulley DO    Current Facility-Administered Medications  Medication Dose Route Frequency Provider Last Rate Last Dose  . 0.9 %  sodium chloride infusion   Intravenous Continuous JLoralie Champagne PA-C       Current Outpatient Prescriptions  Medication Sig Dispense Refill  . pantoprazole (PROTONIX) 40 MG tablet TAKE 1 TABLET (40 MG  TOTAL) BY MOUTH 2 (TWO) TIMES DAILY. 60 tablet 3  . clonazePAM (KLONOPIN) 0.5 MG tablet Take 1 tablet (0.5 mg total) by mouth 2 (two) times daily as needed for anxiety. (Patient not taking: Reported on 01/18/2015) 30 tablet 0  . HYDROcodone-acetaminophen (NORCO/VICODIN) 5-325 MG per tablet Take 1-2 tablets by mouth every 6 (six) hours as needed for moderate pain or severe pain. (Patient not taking: Reported on 01/18/2015) 20 tablet 0  . HYDROmorphone (DILAUDID) 2 MG tablet Take 0.5 tablets (1 mg total) by mouth every 6 (six) hours as needed for moderate pain or severe pain. (Patient not taking: Reported on 01/18/2015) 6 tablet 0  . MOVIPREP  100 G SOLR Take 1 kit (200 g total) by mouth once. (Patient not taking: Reported on 12/05/2014) 1 kit 0  . nortriptyline (PAMELOR) 25 MG capsule Take 1 capsule (25 mg total) by mouth at bedtime. (Patient not taking: Reported on 12/05/2014) 30 capsule 0  . ondansetron (ZOFRAN ODT) 4 MG disintegrating tablet 80m ODT q4 hours prn nausea/vomit (Patient not taking: Reported on 01/18/2015) 20 tablet 0  . tiZANidine (ZANAFLEX) 4 MG tablet Take 1 tablet (4 mg total) by mouth every 8 (eight) hours as needed for muscle spasms. (Patient not taking: Reported on 12/05/2014) 30 tablet 0  . traMADol (ULTRAM) 50 MG tablet Take 1 tablet (50 mg total) by mouth 2 (two) times daily. (Patient not taking: Reported on 12/05/2014) 20 tablet 0    Allergies as of 01/18/2015  . (No Known Allergies)    Family History  Problem Relation Age of Onset  . Hypertension Mother   . Hypertension Father   . Colon cancer Neg Hx   . Esophageal cancer Neg Hx   . Rectal cancer Neg Hx   . Stomach cancer Neg Hx     History   Social History  . Marital Status: Single    Spouse Name: N/A  . Number of Children: N/A  . Years of Education: N/A   Occupational History  . Not on file.   Social History Main Topics  . Smoking status: Never Smoker   . Smokeless tobacco: Never Used  . Alcohol Use: No  . Drug Use: No     Comment: opiate abuse  . Sexual Activity: No   Other Topics Concern  . Not on file   Social History Narrative    Review of Systems: Ten point ROS is O/W negative except as mentioned in HPI.  Physical Exam: Vital signs in last 24 hours: Temp:  [98.3 F (36.8 C)] 98.3 F (36.8 C) (04/04 1213) Pulse Rate:  [84-86] 84 (04/04 1213) Resp:  [16-20] 20 (04/04 1213) BP: (115-116)/(68-74) 115/68 mmHg (04/04 1213) SpO2:  [100 %] 100 % (04/04 1213) Weight:  [180 lb (81.647 kg)] 180 lb (81.647 kg) (04/04 1213)   General:   Alert, Well-developed, well-nourished, pleasant and cooperative in NAD Head:  Normocephalic  and atraumatic. Eyes:  Sclera clear, no icterus.  Conjunctiva pink. Ears:  Normal auditory acuity. Mouth:  No deformity or lesions.  Lungs:  Clear throughout to auscultation.  No wheezes, crackles, or rhonchi.  Heart:  Regular rate and rhythm; no murmurs. Abdomen:  Soft, non-distended.  BS present.  Non-tender. Rectal:  Deferred  Msk:  Symmetrical without gross deformities. Pulses:  Normal pulses noted. Extremities:  Without clubbing or edema. Neurologic:  Alert and  oriented x4;  grossly normal neurologically. Skin:  Intact without significant lesions or rashes. Psych:  Alert and cooperative. Normal mood and  affect.  IMPRESSION:  -34 year old male with history of esophageal stricture on EGD in 11/2014 by Dr. Fuller Plan that was dilated.  Now with possible food impaction.    PLAN: -EGD today rule out impaction.  Likely can be discharged post-procedure.   ZEHR, JESSICA D.  01/18/2015, 12:28 PM  Pager number [phone] Attending MD note:   I have taken a history, examined the patient, and reviewed the chart. I agree with the Advanced Practitioner's impression and recommendations. He has undergone  5 EGD's/dil in last 12 months ( max dil 14 mm), Bx's do not show eosinophillic esophagitis. Currently he able to swallow his saliva. Will proceed with EGD/dil.  [facility] Pager # [phone]

## 2015-01-18 NOTE — Anesthesia Postprocedure Evaluation (Signed)
  Anesthesia Post-op Note  Patient: Eric Hood  Procedure(s) Performed: Procedure(s) (LRB): ESOPHAGOGASTRODUODENOSCOPY (EGD) WITH PROPOFOL (N/A)  Patient Location: PACU  Anesthesia Type: General  Level of Consciousness: awake and alert   Airway and Oxygen Therapy: Patient Spontanous Breathing  Post-op Pain: mild  Post-op Assessment: Post-op Vital signs reviewed, Patient's Cardiovascular Status Stable, Respiratory Function Stable, Patent Airway and No signs of Nausea or vomiting  Last Vitals:  Filed Vitals:   01/18/15 1350  BP:   Pulse: 85  Temp:   Resp: 13    Post-op Vital Signs: stable   Complications: No apparent anesthesia complications

## 2015-01-18 NOTE — Interval H&P Note (Signed)
History and Physical Interval Note:  01/18/2015 12:53 PM  Eric Hood  has presented today for surgery, with the diagnosis of food impaction  The various methods of treatment have been discussed with the patient and family. After consideration of risks, benefits and other options for treatment, the patient has consented to  Procedure(s): ESOPHAGOGASTRODUODENOSCOPY (EGD) WITH PROPOFOL (N/A) as a surgical intervention .  The patient's history has been reviewed, patient examined, no change in status, stable for surgery.  I have reviewed the patient's chart and labs.  Questions were answered to the patient's satisfaction.     Delfin Edis

## 2015-01-18 NOTE — Discharge Instructions (Signed)
Esophagogastroduodenoscopy °Care After °Refer to this sheet in the next few weeks. These instructions provide you with information on caring for yourself after your procedure. Your caregiver may also give you more specific instructions. Your treatment has been planned according to current medical practices, but problems sometimes occur. Call your caregiver if you have any problems or questions after your procedure.  °HOME CARE INSTRUCTIONS °· Do not eat or drink anything until the numbing medicine (local anesthetic) has worn off and your gag reflex has returned. You will know that the local anesthetic has worn off when you can swallow comfortably. °· Do not drive for 12 hours after the procedure or as directed by your caregiver. °· Only take medicines as directed by your caregiver. °SEEK MEDICAL CARE IF:  °· You cannot stop coughing. °· You are not urinating at all or less than usual. °SEEK IMMEDIATE MEDICAL CARE IF: °· You have difficulty swallowing. °· You cannot eat or drink. °· You have worsening throat or chest pain. °· You have dizziness, lightheadedness, or you faint. °· You have nausea or vomiting. °· You have chills. °· You have a fever. °· You have severe abdominal pain. °· You have black, tarry, or bloody stools. °Document Released: 09/18/2012 Document Reviewed: 09/18/2012 °Exit[employer]® Patient Information ©2015 [employer]. This information is not intended to replace advice given to you by your health care provider. Make sure you discuss any questions you have with your health care provider. ° °

## 2015-01-18 NOTE — Anesthesia Preprocedure Evaluation (Signed)
Anesthesia Evaluation  Patient identified by MRN, date of birth, ID band Patient awake    Reviewed: Allergy & Precautions, NPO status , Patient's Chart, lab work & pertinent test results  History of Anesthesia Complications Negative for: history of anesthetic complications  Airway Mallampati: II  TM Distance: >3 FB Neck ROM: Full    Dental no notable dental hx. (+) Dental Advisory Given   Pulmonary neg pulmonary ROS,  breath sounds clear to auscultation  Pulmonary exam normal       Cardiovascular negative cardio ROS  Rhythm:Regular Rate:Normal     Neuro/Psych Anxiety negative neurological ROS     GI/Hepatic Neg liver ROS, hiatal hernia, PUD, GERD-  Medicated and Controlled,  Endo/Other  negative endocrine ROS  Renal/GU negative Renal ROS  negative genitourinary   Musculoskeletal negative musculoskeletal ROS (+)   Abdominal   Peds negative pediatric ROS (+)  Hematology  (+) anemia ,   Anesthesia Other Findings   Reproductive/Obstetrics negative OB ROS                             Anesthesia Physical Anesthesia Plan  ASA: II  Anesthesia Plan: General   Post-op Pain Management:    Induction: Intravenous, Rapid sequence and Cricoid pressure planned  Airway Management Planned: Oral ETT  Additional Equipment:   Intra-op Plan:   Post-operative Plan: Extubation in OR  Informed Consent: I have reviewed the patients History and Physical, chart, labs and discussed the procedure including the risks, benefits and alternatives for the proposed anesthesia with the patient or authorized representative who has indicated his/her understanding and acceptance.   Dental advisory given  Plan Discussed with: CRNA  Anesthesia Plan Comments:         Anesthesia Quick Evaluation

## 2015-01-18 NOTE — ED Notes (Signed)
Pt to endo with endo RN at this time.  NAD upon leaving dept.

## 2015-01-18 NOTE — Op Note (Signed)
[facility] [place], [id number]   ENDOSCOPY PROCEDURE REPORT  PATIENT: Eric Hood, Eric Hood  MR#: [id number] BIRTHDATE: [birth year] , 33  yrs. old GENDER: male ENDOSCOPIST: Lafayette Dragon, MD REFERRED BY:  Ladene Artist, M.D, [facility] PROCEDURE DATE:  01/18/2015 PROCEDURE:  EGD w/ biopsy and EGD w/ wire guided (savary) dilation  ASA CLASS:     Class I INDICATIONS:  dysphagia and acute food impaction in the esophagus. Patient with history of severe esophagitis.  Prior endoscopies and dilatations in June 2015, November 2015, December 2015, February 2016 on Protonix 40 mg twice a day.  Biopsies did not show eosinophilic esophagitis. MEDICATIONS: Per Anesthesia  , general anesthesia TOPICAL ANESTHETIC: none  DESCRIPTION OF PROCEDURE: After the risks benefits and alternatives of the procedure were thoroughly explained, informed consent was obtained.  The Pentax Gastroscope [id number] endoscope was introduced through the mouth and advanced to the second portion of the duodenum , Without limitations.  The instrument was slowly withdrawn as the mucosa was fully examined.    Esophagus: esophagus was intubated without difficulty. Patient had an endotracheal tube placed prior to the procedure. Starting at 28 cm from the incisors mucosa had linear tears spontaneous bleeding and the lumen  was concentrically narrowed[ so that barely allowed the 9 mm scope to pass through.there was an impacted piece of food that looked like alarge bean and  was stuck in the midesophagus. It was easily pushed down into the stomach  There  was a long stricture extending from 28-32 cm from the incisor. Another stricture was located at 32 cm from the incisors and was only mild. There was a 3 cm hiatal hernia distal to the Z line. Biopsies were obtained from the segment of  acute esophagitis to rule out eosinophilic esophagitis or epidrmolysis bullosa..  Stomach: there was 3-4 cm hiatal  hernia extending from 42-45 cm from the incisors. No Cameron erosions. There was no retention of food in the stomach  Duodenum: duodenal bulb and descending duodenum was normal  Guidewires was then placed in the antrum and Savary dilators passed gently over the guidewire starting with 11 mm, 12 mm, 12.8 mm and 14 mm. There was blood on each dilator and there was mild resistance to each dilator The scope was then withdrawn from the patient and the procedure completed.  COMPLICATIONS: There were no immediate complications.  ENDOSCOPIC IMPRESSION:  1. severe segmental esophagitis from 28-32 cm from the incisors consistently eosinophilic or other autoimmune esophagitis. Biopsies obtained 2. Foreign body impaction. Removal 3. Long 4 cm mid esophageal stricture likely chronic with severe acute inflammation 4. 3 cm hiatal hernia  RECOMMENDATIONS: I think patient's esophagitis is on other basis than reflux. The stricture is in the midesophagus and its very long resemblling eosinophilic esophagitis but the biopsies from the past endoscopies did not show abundance of eosinophils. He will return to Dr. Fuller Plan for follow-up and continue strict antireflux measures and Protonix 40 mg twice a day  .  REPEAT EXAM:  eSigned:  Lafayette Dragon, MD 01/18/2015 1:54 PM    CC:  PATIENT NAME:  Eric Hood, Eric Hood MR#: [id number]

## 2015-01-18 NOTE — Telephone Encounter (Signed)
Left message for patient to call back  

## 2015-01-18 NOTE — ED Provider Notes (Signed)
CSN: [id number]     Arrival date & time 01/18/15  1003 History   First MD Initiated Contact with Patient 01/18/15 1030     No chief complaint on file.  (Consider location/radiation/quality/duration/timing/severity/associated sxs/prior Treatment) HPI  Eric Hood is a 34 year old male presenting with report of foreign body in esophagus. He states he has a history of esophageal strictures and often stuck in his throat after eating. He states he knows when this occurs because he first gets sensation of food stuck in his throat followed by the inability to eat or drink anything else about it causing spitting up or vomiting. He states he's attempted to drink liquids several times and each time he has not been able to tolerate it. He reports being a patient of Dr. Fuller Plan and last been evaluated by him approximately 2 months ago. He denies any trouble breathing or recent illness or injury, fevers or abdominal pain.    Past Medical History  Diagnosis Date  . Back pain   . Reflux   . Esophageal stricture 03/2014  . Hiatal hernia   . Ulcerative esophagitis 03/2014    severe  . Anxiety   . GERD (gastroesophageal reflux disease)   . Anemia 10/23/2014   Past Surgical History  Procedure Laterality Date  . Hand surgery Right   . Esophageal dilation      On 3 occasions: 03/2014, 08/2014, 09/2014   Family History  Problem Relation Age of Onset  . Hypertension Mother   . Hypertension Father   . Colon cancer Neg Hx   . Esophageal cancer Neg Hx   . Rectal cancer Neg Hx   . Stomach cancer Neg Hx    History  Substance Use Topics  . Smoking status: Never Smoker   . Smokeless tobacco: Never Used  . Alcohol Use: No    Review of Systems  Constitutional: Negative for fever and chills.  HENT: Positive for trouble swallowing ( Sensation of food in esophagus). Negative for sore throat.   Eyes: Negative for visual disturbance.  Respiratory: Negative for cough and shortness of breath.    Cardiovascular: Negative for chest pain and leg swelling.  Gastrointestinal: Positive for vomiting ( Spiiting up water). Negative for nausea and diarrhea.  Genitourinary: Negative for dysuria.  Musculoskeletal: Negative for myalgias.  Skin: Negative for rash.  Neurological: Negative for weakness, numbness and headaches.    Allergies  Review of patient's allergies indicates no known allergies.  Home Medications   Prior to Admission medications   Medication Sig Start Date End Date Taking? Authorizing Provider  pantoprazole (PROTONIX) 40 MG tablet TAKE 1 TABLET (40 MG TOTAL) BY MOUTH 2 (TWO) TIMES DAILY. 10/12/14  Yes Eulas Post, MD  clonazePAM (KLONOPIN) 0.5 MG tablet Take 1 tablet (0.5 mg total) by mouth 2 (two) times daily as needed for anxiety. Patient not taking: Reported on 01/18/2015 10/28/14   Eulas Post, MD  HYDROcodone-acetaminophen (NORCO/VICODIN) 5-325 MG per tablet Take 1-2 tablets by mouth every 6 (six) hours as needed for moderate pain or severe pain. Patient not taking: Reported on 01/18/2015 12/01/14   Dahlia Bailiff, PA-C  HYDROmorphone (DILAUDID) 2 MG tablet Take 0.5 tablets (1 mg total) by mouth every 6 (six) hours as needed for moderate pain or severe pain. Patient not taking: Reported on 01/18/2015 12/06/14   Pamella Pert, MD  MOVIPREP 100 G SOLR Take 1 kit (200 g total) by mouth once. Patient not taking: Reported on 12/05/2014 11/13/14   Ladene Artist,  MD  nortriptyline (PAMELOR) 25 MG capsule Take 1 capsule (25 mg total) by mouth at bedtime. Patient not taking: Reported on 12/05/2014 10/28/14   Lyndal Pulley, DO  ondansetron (ZOFRAN ODT) 4 MG disintegrating tablet 24m ODT q4 hours prn nausea/vomit Patient not taking: Reported on 01/18/2015 12/06/14   FPamella Pert MD  tiZANidine (ZANAFLEX) 4 MG tablet Take 1 tablet (4 mg total) by mouth every 8 (eight) hours as needed for muscle spasms. Patient not taking: Reported on 12/05/2014 10/21/14   ZLyndal Pulley DO   traMADol (ULTRAM) 50 MG tablet Take 1 tablet (50 mg total) by mouth 2 (two) times daily. Patient not taking: Reported on 12/05/2014 10/28/14   ZLyndal Pulley DO   BP 116/74 mmHg  Pulse 86  Temp(Src) 98.3 F (36.8 C) (Oral)  Resp 16  SpO2 100% Physical Exam  Constitutional: He appears well-developed and well-nourished. No distress.  HENT:  Head: Normocephalic and atraumatic.  Mouth/Throat: Oropharynx is clear and moist. No oropharyngeal exudate.  Eyes: Conjunctivae are normal.  Neck: Neck supple. No thyromegaly present.  Cardiovascular: Normal rate, regular rhythm and intact distal pulses.   Pulmonary/Chest: Effort normal and breath sounds normal. No respiratory distress. He has no wheezes. He has no rales. He exhibits no tenderness.  Abdominal: Soft. There is no tenderness.  Musculoskeletal: He exhibits no tenderness.  Lymphadenopathy:    He has no cervical adenopathy.  Neurological: He is alert.  Skin: Skin is warm and dry. No rash noted. He is not diaphoretic.  Psychiatric: He has a normal mood and affect.  Nursing note and vitals reviewed.   ED Course  Procedures (including critical care time) Labs Review Labs Reviewed - No data to display  Imaging Review No results found.   EKG Interpretation None      MDM   Final diagnoses:  Foreign body sensation in throat   34yo male presenting with report of food lodged in esophagus.  Tolerating his saliva and no acute distress noted. Discussed case with Dr. PJohnney Killianand consulted his GI team.  Spoke to JJanett Billow PVermontfor Dr. BMaurene Capes(on for Dr. SFuller Plan who plan to take to endoscopy and will put on schedule for 1:30 PM.  Discussed with pt will place IV and PA will come to evaluate.  Pt taken to endo with plan to discharge or admit from there. Pt is in no acute distress.  Filed Vitals:   01/18/15 1019 01/18/15 1021 01/18/15 1028  BP: 116/74    Pulse:   86  Temp:  98.3 F (36.8 C)   TempSrc:  Oral   Resp:  16   SpO2:    100%   Meds given in ED:  Medications - No data to display  New Prescriptions   No medications on file       EBritt Bottom NP 01/19/15 [place] MD 01/19/15 1751

## 2015-01-18 NOTE — ED Notes (Signed)
Pt aware of plan to go for endo in approx 1.5hrs per EDPA.  Awaiting GI PA eval.  Pt denies questions/needs at this time.

## 2015-01-19 ENCOUNTER — Encounter: Payer: Self-pay | Admitting: Internal Medicine

## 2015-01-19 ENCOUNTER — Encounter (HOSPITAL_COMMUNITY): Payer: Self-pay | Admitting: Internal Medicine

## 2015-01-20 ENCOUNTER — Ambulatory Visit (INDEPENDENT_AMBULATORY_CARE_PROVIDER_SITE_OTHER): Payer: 59 | Admitting: Family Medicine

## 2015-01-20 ENCOUNTER — Other Ambulatory Visit (INDEPENDENT_AMBULATORY_CARE_PROVIDER_SITE_OTHER): Payer: 59

## 2015-01-20 ENCOUNTER — Encounter: Payer: Self-pay | Admitting: Family Medicine

## 2015-01-20 VITALS — BP 120/70 | HR 77 | Ht 72.0 in | Wt 179.0 lb

## 2015-01-20 DIAGNOSIS — M9901 Segmental and somatic dysfunction of cervical region: Secondary | ICD-10-CM | POA: Diagnosis not present

## 2015-01-20 DIAGNOSIS — M9908 Segmental and somatic dysfunction of rib cage: Secondary | ICD-10-CM | POA: Diagnosis not present

## 2015-01-20 DIAGNOSIS — M2550 Pain in unspecified joint: Secondary | ICD-10-CM | POA: Diagnosis not present

## 2015-01-20 DIAGNOSIS — M25532 Pain in left wrist: Secondary | ICD-10-CM

## 2015-01-20 DIAGNOSIS — M999 Biomechanical lesion, unspecified: Secondary | ICD-10-CM

## 2015-01-20 DIAGNOSIS — S62009A Unspecified fracture of navicular [scaphoid] bone of unspecified wrist, initial encounter for closed fracture: Secondary | ICD-10-CM | POA: Insufficient documentation

## 2015-01-20 DIAGNOSIS — M9903 Segmental and somatic dysfunction of lumbar region: Secondary | ICD-10-CM

## 2015-01-20 DIAGNOSIS — M546 Pain in thoracic spine: Secondary | ICD-10-CM | POA: Diagnosis not present

## 2015-01-20 DIAGNOSIS — M9904 Segmental and somatic dysfunction of sacral region: Secondary | ICD-10-CM | POA: Diagnosis not present

## 2015-01-20 DIAGNOSIS — S62002A Unspecified fracture of navicular [scaphoid] bone of left wrist, initial encounter for closed fracture: Secondary | ICD-10-CM

## 2015-01-20 LAB — SEDIMENTATION RATE: Sed Rate: 8 mm/hr (ref 0–22)

## 2015-01-20 LAB — C-REACTIVE PROTEIN: CRP: 0.1 mg/dL — ABNORMAL LOW (ref 0.5–20.0)

## 2015-01-20 NOTE — Assessment & Plan Note (Signed)
Looking at patient's x-ray as well as physical exam the patient does have pain over the scaphoid bone. Then appears that there is some changes of the proximal pole which is concerning because low likelihood of healing. Also with patient's anemia there is a good consent possibility of patient having an avascular necrosis at some point. Patient though has elected to try conservative therapy incentive any further imaging at this time. Patient will be put in a cast that is multiple as well as removable. Patient will start vitamin D supplementation to help with the healing we discussed icing. Patient will do some range of motion exercises with no weight multiple times daily. Patient will come back again in 2-3 weeks to make her patient is healing appropriately.

## 2015-01-20 NOTE — Progress Notes (Signed)
[facility] [address] [place], [place] [address] Phone: [phone] Subjective:     CC: Thoracic back pain, follow up knee wrist pain  [id number] Eric Hood is a 34 y.o. male coming in with complaint of back pain. Patient has had a long history recently. Patient was also admitted recently again for more of an esophagitis and there is a concern for an autoimmune disease. Patient is to be following up with primary care provider at some point. Patient though unfortunately continues to have some difficulty. Patient states that the back is sore overall. An states that it very difficult to do daily activities. Patient has not been able to do the exercises and the only thing that has helped it previously was to manipulation. Patient would like to consider having this done again.  In addition of this patient did have a wrist injury on the left wrist greater than 3 weeks ago. Patient fell while playing soccer and had significant wrist pain immediately. Patient did have an x-rays which were unremarkable. These x-rays were reviewed by me and there did seem to be some thickening or sclerotic changes of the proximal scaphoid. Patient states it still hurts on the thumb side and hurts with almost any type of movement. Patient states he cannot grip anything heavy with this hand. Rates the severity of pain is 8 out of 10.  Review of Systems: No headache, visual changes, nausea, vomiting, diarrhea, constipation, dizziness, abdominal pain, skin rash, fevers, chills, night sweats, weight loss, swollen lymph nodes, body aches, joint swelling, muscle aches, chest pain, shortness of breath, mood changes.   Objective Blood pressure 120/70, pulse 77, height 6' (1.829 m), weight 179 lb (81.194 kg), SpO2 96 %.  General: Patient is ill and appears to be pale HEENT: Pupils equal, extraocular movements intact  Respiratory: Patient's speak in full sentences and does not appear short  of breath  Cardiovascular: No lower extremity edema, non tender, no erythema  Skin: Warm dry intact with no signs of infection or rash on extremities or on axial skeleton.  Abdomen: Soft abdomen at this time. Patient is severely tender in the right lower quadrant. Patient does have involuntary guarding as well as rebound tenderness. This is localized over McBurney's point. Negative psoas sign. Minimal pain of the upper gastric area. Neuro: Cranial nerves II through XII are intact, neurovascularly intact in all extremities with 2+ DTRs and 2+ pulses.  Lymph: No lymphadenopathy of posterior or anterior cervical chain or axillae bilaterally.  Gait normal with good balance and coordination.  MSK:  Non tender with full range of motion and good stability and symmetric strength and tone of shoulders, elbows, hip, knee and ankles bilaterally.  Wrist: Left Inspection normal with no visible erythema or swelling. ROM smooth and normal with good flexion and extension and ulnar/radial deviation that is symmetrical with opposite wrist. Severe pain over the scaphoid bone Positive snuffbox tenderness No tenderness over Canal of Guyon. Strength 5/5 in all directions without pain. Negative Finkelstein, tinel's and phalens. Negative Watson's test. Contralateral wrist unremarkable  MSK US performed of: Left wrist This study was ordered, performed, and interpreted by Charlann Boxer D.O.  Wrist: All extensor compartments visualized and tendons all normal in appearance without fraying, tears, or sheath effusions. No effusion seen. TFCC intact. Scapholunate ligament intact. Patient scaphoid bone does have an area that has hypoechoic changes as well as a cortical defect. No significant displacement noted. Unfortunately this is secondary to the proximal  pole. Patient does have some mild callus formation but minimal Carpal tunnel visualized and median nerve area normal, flexor tendons all normal in appearance without  fraying, tears, or sheath effusions. Power doppler signal normal.  IMPRESSION:  Scaphoid fracture with minimal healing    OMT Physical Exam  Cervical  C2 flexed rotated and side bent left  Thoracic T5 extended rotated inside that right T8 extended rotated and side bent left  Lumbar L2 flexed rotated and side bent right  Sacrum Left on left  Illium Neutral    Impression and Recommendations:     This case required medical decision making of moderate complexity.

## 2015-01-20 NOTE — Patient Instructions (Addendum)
Good to see you Avoid antiinflammatories for now.  Continue the other medicines I think we should get labs Wear brace day and night for next 3 weeks.  Start the vitamin D again at 2000 IU daily See me again in 3 weeks.

## 2015-01-20 NOTE — Assessment & Plan Note (Signed)
Decision today to treat with OMT was based on Physical Exam  After verbal consent patient was treated with HVLA, ME, FPR techniques in Cervical, thoracic,  lumbar and sacral areas  Patient tolerated the procedure well with improvement in symptoms  Patient given exercises, stretches and lifestyle modifications  See medications in patient instructions if given  Patient will follow up in 2 weeks

## 2015-01-20 NOTE — Assessment & Plan Note (Signed)
Patient thoracic back pain is still multifactorial. I do think that there is a possibility for a concern for nonunion labs were taken today. We discussed icing regimen and home exercises. Patient will continue to avoid any anti-inflammatories. Patient can take some Tylenol we discussed but only sparingly. We went to avoid anything if we can. Patient will stay with some vitamin D supplementation. Patient will come back and see me again in 2-3 weeks for further evaluation and treatment.

## 2015-01-20 NOTE — Progress Notes (Signed)
Pre visit review using our clinic review tool, if applicable. No additional management support is needed unless otherwise documented below in the visit note. 

## 2015-01-21 LAB — ANA: ANA: NEGATIVE

## 2015-01-21 LAB — ANTI-DNA ANTIBODY, DOUBLE-STRANDED: DS DNA AB: 1 [IU]/mL

## 2015-02-10 ENCOUNTER — Encounter: Payer: Self-pay | Admitting: Family Medicine

## 2015-02-10 ENCOUNTER — Encounter (HOSPITAL_COMMUNITY): Payer: Self-pay | Admitting: Family Medicine

## 2015-02-10 ENCOUNTER — Emergency Department (HOSPITAL_COMMUNITY)
Admission: EM | Admit: 2015-02-10 | Discharge: 2015-02-10 | Disposition: A | Discharge status: Home / Self Care | Payer: 59 | Attending: Emergency Medicine | Admitting: Emergency Medicine

## 2015-02-10 ENCOUNTER — Ambulatory Visit (INDEPENDENT_AMBULATORY_CARE_PROVIDER_SITE_OTHER): Payer: 59 | Admitting: Family Medicine

## 2015-02-10 VITALS — BP 108/62 | HR 74 | Ht 72.0 in | Wt 166.0 lb

## 2015-02-10 DIAGNOSIS — M546 Pain in thoracic spine: Secondary | ICD-10-CM

## 2015-02-10 DIAGNOSIS — R1110 Vomiting, unspecified: Secondary | ICD-10-CM | POA: Diagnosis not present

## 2015-02-10 DIAGNOSIS — M9903 Segmental and somatic dysfunction of lumbar region: Secondary | ICD-10-CM | POA: Diagnosis not present

## 2015-02-10 DIAGNOSIS — R634 Abnormal weight loss: Secondary | ICD-10-CM

## 2015-02-10 DIAGNOSIS — M9901 Segmental and somatic dysfunction of cervical region: Secondary | ICD-10-CM | POA: Diagnosis not present

## 2015-02-10 DIAGNOSIS — R630 Anorexia: Secondary | ICD-10-CM | POA: Insufficient documentation

## 2015-02-10 DIAGNOSIS — K219 Gastro-esophageal reflux disease without esophagitis: Secondary | ICD-10-CM | POA: Insufficient documentation

## 2015-02-10 DIAGNOSIS — Z862 Personal history of diseases of the blood and blood-forming organs and certain disorders involving the immune mechanism: Secondary | ICD-10-CM | POA: Insufficient documentation

## 2015-02-10 DIAGNOSIS — Z8659 Personal history of other mental and behavioral disorders: Secondary | ICD-10-CM | POA: Insufficient documentation

## 2015-02-10 DIAGNOSIS — M9904 Segmental and somatic dysfunction of sacral region: Secondary | ICD-10-CM

## 2015-02-10 DIAGNOSIS — R5383 Other fatigue: Secondary | ICD-10-CM | POA: Insufficient documentation

## 2015-02-10 DIAGNOSIS — S62002A Unspecified fracture of navicular [scaphoid] bone of left wrist, initial encounter for closed fracture: Secondary | ICD-10-CM | POA: Diagnosis not present

## 2015-02-10 DIAGNOSIS — M999 Biomechanical lesion, unspecified: Secondary | ICD-10-CM

## 2015-02-10 LAB — COMPREHENSIVE METABOLIC PANEL
ALT: 60 U/L — ABNORMAL HIGH (ref 0–53)
AST: 70 U/L — AB (ref 0–37)
Albumin: 4 g/dL (ref 3.5–5.2)
Alkaline Phosphatase: 81 U/L (ref 39–117)
Anion gap: 10 (ref 5–15)
BUN: 18 mg/dL (ref 6–23)
CALCIUM: 9.6 mg/dL (ref 8.4–10.5)
CHLORIDE: 106 mmol/L (ref 96–112)
CO2: 26 mmol/L (ref 19–32)
Creatinine, Ser: 1.27 mg/dL (ref 0.50–1.35)
GFR calc Af Amer: 85 mL/min — ABNORMAL LOW (ref >90)
GFR calc non Af Amer: 73 mL/min — ABNORMAL LOW (ref >90)
Glucose, Bld: 104 mg/dL — ABNORMAL HIGH (ref 70–99)
Potassium: 3.8 mmol/L (ref 3.5–5.1)
Sodium: 142 mmol/L (ref 135–145)
Total Bilirubin: 0.8 mg/dL (ref 0.3–1.2)
Total Protein: 7.2 g/dL (ref 6.0–8.3)

## 2015-02-10 LAB — CBC WITH DIFFERENTIAL/PLATELET
BASOS ABS: 0 10*3/uL (ref 0.0–0.1)
BASOS PCT: 0 % (ref 0–1)
EOS ABS: 0 10*3/uL (ref 0.0–0.7)
Eosinophils Relative: 0 % (ref 0–5)
HEMATOCRIT: 40 % (ref 39.0–52.0)
Hemoglobin: 12.6 g/dL — ABNORMAL LOW (ref 13.0–17.0)
Lymphocytes Relative: 21 % (ref 12–46)
Lymphs Abs: 1.9 10*3/uL (ref 0.7–4.0)
MCH: 24.8 pg — ABNORMAL LOW (ref 26.0–34.0)
MCHC: 31.5 g/dL (ref 30.0–36.0)
MCV: 78.7 fL (ref 78.0–100.0)
Monocytes Absolute: 0.6 10*3/uL (ref 0.1–1.0)
Monocytes Relative: 6 % (ref 3–12)
NEUTROS ABS: 6.4 10*3/uL (ref 1.7–7.7)
Neutrophils Relative %: 73 % (ref 43–77)
Platelets: 412 10*3/uL — ABNORMAL HIGH (ref 150–400)
RBC: 5.08 MIL/uL (ref 4.22–5.81)
RDW: 14.9 % (ref 11.5–15.5)
WBC: 8.8 10*3/uL (ref 4.0–10.5)

## 2015-02-10 LAB — URINALYSIS, ROUTINE W REFLEX MICROSCOPIC
Glucose, UA: NEGATIVE mg/dL
Hgb urine dipstick: NEGATIVE
Ketones, ur: NEGATIVE mg/dL
LEUKOCYTES UA: NEGATIVE
Nitrite: NEGATIVE
PROTEIN: NEGATIVE mg/dL
Specific Gravity, Urine: 1.029 (ref 1.005–1.030)
UROBILINOGEN UA: 1 mg/dL (ref 0.0–1.0)
pH: 6 (ref 5.0–8.0)

## 2015-02-10 LAB — LIPASE, BLOOD: Lipase: 37 U/L (ref 11–59)

## 2015-02-10 MED ORDER — PANTOPRAZOLE SODIUM 40 MG PO TBEC: DELAYED_RELEASE_TABLET | ORAL | Status: DC

## 2015-02-10 MED ORDER — ONDANSETRON 4 MG PO TBDP: 4.0000 mg | ORAL_TABLET | ORAL | Status: DC | PRN

## 2015-02-10 MED ORDER — SODIUM CHLORIDE 0.9 % IV SOLN
1000.0000 mL | INTRAVENOUS | Status: DC
  Administered 2015-02-10: 1000 mL via INTRAVENOUS

## 2015-02-10 MED ORDER — SODIUM CHLORIDE 0.9 % IV SOLN
1000.0000 mL | Freq: Once | INTRAVENOUS | Status: AC
  Administered 2015-02-10: 1000 mL via INTRAVENOUS

## 2015-02-10 NOTE — ED Notes (Signed)
Pt sts hypotensive, weak and weight loss. sts started Saturday with weakness, N,V,D.

## 2015-02-10 NOTE — ED Notes (Signed)
Patient aware Urine is needed.

## 2015-02-10 NOTE — Assessment & Plan Note (Signed)
Patient continues to respond fairly well to conservative therapy. I do think that patient will continue to need osteopathic manipulation on a fairly regular interval. We discussed icing regimen and home exercises. We discussed what activities potentially avoid and patient needs to become more active. Patient has had difficulty finding the energy ago.

## 2015-02-10 NOTE — ED Provider Notes (Signed)
CSN: [id number]     Arrival date & time 02/10/15  1039 History   First MD Initiated Contact with Patient 02/10/15 1226     Chief Complaint  Patient presents with  . Hypotension  . Weight Loss     (Consider location/radiation/quality/duration/timing/severity/associated sxs/prior Treatment) HPI The patient reports he's been losing weight over the past several weeks. He reports she's lost about 20 pounds. He states he continues to have a low appetite and occasionally has some vomiting. He has had multiple problems with reflux and esophageal stricture. No fevers. He feels generally weak. Past Medical History  Diagnosis Date  . Back pain   . Reflux   . Esophageal stricture 03/2014  . Hiatal hernia   . Ulcerative esophagitis 03/2014    severe  . Anxiety   . GERD (gastroesophageal reflux disease)   . Anemia 10/23/2014   Past Surgical History  Procedure Laterality Date  . Hand surgery Right   . Esophageal dilation      On 3 occasions: 03/2014, 08/2014, 09/2014  . Esophagogastroduodenoscopy (egd) with propofol N/A 01/18/2015    Procedure: ESOPHAGOGASTRODUODENOSCOPY (EGD) WITH PROPOFOL;  Surgeon: Lafayette Dragon, MD;  Location: [facility];  Service: Endoscopy;  Laterality: N/A;   Family History  Problem Relation Age of Onset  . Hypertension Mother   . Hypertension Father   . Colon cancer Neg Hx   . Esophageal cancer Neg Hx   . Rectal cancer Neg Hx   . Stomach cancer Neg Hx    History  Substance Use Topics  . Smoking status: Never Smoker   . Smokeless tobacco: Never Used  . Alcohol Use: No    Review of Systems  10 Systems reviewed and are negative for acute change except as noted in the HPI.   Allergies  Review of patient's allergies indicates no known allergies.  Home Medications   Prior to Admission medications   Medication Sig Start Date End Date Taking? Authorizing Provider  pantoprazole (PROTONIX) 40 MG tablet TAKE 1 TABLET (40 MG TOTAL) BY MOUTH 2 (TWO) TIMES DAILY.  02/10/15  Yes Lyndal Pulley, DO  ondansetron (ZOFRAN ODT) 4 MG disintegrating tablet Take 1 tablet (4 mg total) by mouth every 4 (four) hours as needed for nausea or vomiting. 02/10/15   Charlesetta Shanks, MD   BP 103/50 mmHg  Pulse 91  Temp(Src) 98.4 F (36.9 C) (Oral)  Resp 18  Ht 6' (1.829 m)  Wt 166 lb (75.297 kg)  BMI 22.51 kg/m2  SpO2 96% Physical Exam  Constitutional: He is oriented to person, place, and time. He appears well-developed and well-nourished.  HENT:  Head: Normocephalic and atraumatic.  There is some staining and irregularity of the enamel of the teeth. This is on all of the dentition.  Eyes: EOM are normal. Pupils are equal, round, and reactive to light.  Neck: Neck supple.  Cardiovascular: Normal rate, regular rhythm, normal heart sounds and intact distal pulses.   Pulmonary/Chest: Effort normal and breath sounds normal.  Abdominal: Soft. Bowel sounds are normal. He exhibits no distension. There is no tenderness.  Musculoskeletal: Normal range of motion. He exhibits no edema.  Neurological: He is alert and oriented to person, place, and time. He has normal strength. Coordination normal. GCS eye subscore is 4. GCS verbal subscore is 5. GCS motor subscore is 6.  Skin: Skin is warm, dry and intact.  Psychiatric: He has a normal mood and affect.    ED Course  Procedures (including critical care time)  Labs Review Labs Reviewed  CBC WITH DIFFERENTIAL/PLATELET - Abnormal; Notable for the following:    Hemoglobin 12.6 (*)    MCH 24.8 (*)    Platelets 412 (*)    All other components within normal limits  COMPREHENSIVE METABOLIC PANEL - Abnormal; Notable for the following:    Glucose, Bld 104 (*)    AST 70 (*)    ALT 60 (*)    GFR calc non Af Amer 73 (*)    GFR calc Af Amer 85 (*)    All other components within normal limits  URINALYSIS, ROUTINE W REFLEX MICROSCOPIC - Abnormal; Notable for the following:    Color, Urine AMBER (*)    APPearance HAZY (*)     Bilirubin Urine SMALL (*)    All other components within normal limits  LIPASE, BLOOD  GLIADIN ANTIBODIES, SERUM  TISSUE TRANSGLUTAMINASE, IGA  RETICULIN ANTIBODIES, IGA W TITER    Imaging Review No results found.   EKG Interpretation None      MDM   Final diagnoses:  Weight loss  Other fatigue   The patient remains well-nourished in appearance. He has good muscular structure. He does not show shot signs of cachexia or atrophic appearance. Diagnostic studies do not show acute abnormalities. The patient has had a relatively longer course of problems with abdominal pain and bowel dysfunction. He has had to have esophageal dilations that he reports reports of 6 up her endoscopies in the past several years. He also reports having had lower endoscopy as well. View of his medical records indicates intermittent and ongoing problems with joint pains. Today don't find a specific etiology or a indication for admitting the patient to the hospital. I reviewed medical record and did not specifically find any indication of evaluation for celiac disease. Serum studies have been added and the patient's been made aware of this and the need to follow-up on these. At this time I do recommend continued outpatient evaluation and referrals as needed by his physician.    Charlesetta Shanks, MD 02/10/15 [id number]

## 2015-02-10 NOTE — Progress Notes (Signed)
[facility] [address] [place], [place] [address] Phone: [phone] Subjective:     CC: Thoracic back pain, follow up left wrist pain  [id number] Eric Hood is a 34 y.o. male coming in with complaint of back pain. Patient has had a long history recently. Patient continues to have some mild dull aching throbbing back pain. Patient states sometimes it can be limiting. We then further workup on this previously and please see previous notes. Patient has responded somewhat to osteopathic manipulation previously. Patient would like to have another manipulation. Denies any new symptoms is worsening of his previous symptoms.   Patient is also following up for left wrist pain. Patient was seen previously and was tendons with the scaphoid bone fracture. Due to patient's comorbidities we are having close follow-up secondary to a good chance for patient not healing. Patient has been wearing the cast regularly as well as vitamin D supple mentation. Patient states his wrist is much better. Patient actually stop wearing the brace after couple weeks.  Patient also has had some nausea and vomiting recently. Patient has had some difficulty keeping food down. Patient states that this does not feel like his ulcer. Patient has continued to lose weight and has lost 13 pounds in the last 3 weeks. Patient is following up with gastroenterology in 2 weeks. Patient is feels like he has no energy.  Review of Systems: No headache, visual changes, nausea, vomiting, diarrhea, constipation, dizziness, abdominal pain, skin rash, fevers, chills, night sweats, weight loss, swollen lymph nodes, body aches, joint swelling, muscle aches, chest pain, shortness of breath, mood changes.   Objective Blood pressure 108/62, pulse 74, height 6' (1.829 m), weight 166 lb (75.297 kg), SpO2 96 %.  General: Patient is ill and appears to be pale, and more cachectic than previous visit. HEENT: Pupils  equal, extraocular movements intact  Respiratory: Patient's speak in full sentences and does not appear short of breath  Cardiovascular: No lower extremity edema, non tender, no erythema  Skin: Warm dry intact with no signs of infection or rash on extremities or on axial skeleton.  Abdomen: Soft abdomen at this time. Patient is severely tender in the right lower quadrant. Patient does have involuntary guarding as well as rebound tenderness. This is localized over McBurney's point. Negative psoas sign. Minimal pain of the upper gastric area. Neuro: Cranial nerves II through XII are intact, neurovascularly intact in all extremities with 2+ DTRs and 2+ pulses.  Lymph: No lymphadenopathy of posterior or anterior cervical chain or axillae bilaterally.  Gait normal with good balance and coordination.  MSK:  Non tender with full range of motion and good stability and symmetric strength and tone of shoulders, elbows, hip, knee and ankles bilaterally.  Wrist: Left Inspection normal with no visible erythema or swelling. ROM smooth and normal with good flexion and extension and ulnar/radial deviation that is symmetrical with opposite wrist. Minimal tenderness over the snuff box No tenderness over Canal of Guyon. Strength 5/5 in all directions without pain. Negative Finkelstein, tinel's and phalens. Negative Watson's test. Contralateral wrist unremarkable Significant improvement from previous exam    OMT Physical Exam  Cervical  C2 flexed rotated and side bent left  Thoracic T5 extended rotated inside that right T8 extended rotated and side bent left  Lumbar L2 flexed rotated and side bent right  Sacrum Left on left  Illium Neutral    Impression and Recommendations:     This case required medical decision  making of moderate complexity.

## 2015-02-10 NOTE — Assessment & Plan Note (Signed)
Patient has had some stomach issues and is not as much having appetite. I do think that there is some underlying depression that is also playing a role. Patient will start doing the exercises on a more regular basis and we'll's tried to do some protein supplementations. Patient is following up with gastroenterology in the next 2 weeks. If patient continues to have difficulty I would consider workup for other infectious etiologies as well as potentially HIV and other chronic viral causes. Patient and will come back and see me again in 2-3 weeks for further evaluation and treatment.

## 2015-02-10 NOTE — Discharge Instructions (Signed)

## 2015-02-10 NOTE — Patient Instructions (Signed)
Pedialyte could help and consider boost or ensure.  Brat diet Bananas, rice and toast.  Ice when you need it.  We do need to consider other labs at some point, but auto immune stuff looked good.  See me again in 2 weeks.

## 2015-02-10 NOTE — ED Notes (Signed)
MD notified of patient ABD pain.

## 2015-02-10 NOTE — Assessment & Plan Note (Signed)
Seems to be healed clinically at this time. We'll continue to monitor and patient should follow up again in 4 weeks for one more visit. Patient will increase activity as tolerated. No repeat imaging necessary.

## 2015-02-10 NOTE — Assessment & Plan Note (Signed)
Decision today to treat with OMT was based on Physical Exam  After verbal consent patient was treated with HVLA, ME, FPR techniques in Cervical, thoracic,  lumbar and sacral areas  Patient tolerated the procedure well with improvement in symptoms  Patient given exercises, stretches and lifestyle modifications  See medications in patient instructions if given  Patient will follow up in 2 weeks

## 2015-02-11 LAB — GLIADIN ANTIBODIES, SERUM
GLIADIN IGG: 3 U (ref 0–19)
Gliadin IgA: 5 units (ref 0–19)

## 2015-02-11 LAB — RETICULIN ANTIBODIES, IGA W TITER: Reticulin Ab, IgA: NEGATIVE titer (ref <2.5)

## 2015-02-14 LAB — TISSUE TRANSGLUTAMINASE, IGA

## 2015-02-23 ENCOUNTER — Ambulatory Visit (INDEPENDENT_AMBULATORY_CARE_PROVIDER_SITE_OTHER): Payer: 59 | Admitting: Gastroenterology

## 2015-02-23 ENCOUNTER — Ambulatory Visit (INDEPENDENT_AMBULATORY_CARE_PROVIDER_SITE_OTHER): Payer: 59 | Admitting: Family Medicine

## 2015-02-23 ENCOUNTER — Encounter: Payer: Self-pay | Admitting: Gastroenterology

## 2015-02-23 ENCOUNTER — Encounter: Payer: Self-pay | Admitting: Family Medicine

## 2015-02-23 VITALS — BP 102/58 | HR 99 | Ht 72.0 in | Wt 173.0 lb

## 2015-02-23 VITALS — BP 110/50 | HR 80 | Ht 72.0 in | Wt 173.0 lb

## 2015-02-23 DIAGNOSIS — K2100 Gastro-esophageal reflux disease with esophagitis, without bleeding: Secondary | ICD-10-CM

## 2015-02-23 DIAGNOSIS — M9903 Segmental and somatic dysfunction of lumbar region: Secondary | ICD-10-CM

## 2015-02-23 DIAGNOSIS — R1314 Dysphagia, pharyngoesophageal phase: Secondary | ICD-10-CM | POA: Diagnosis not present

## 2015-02-23 DIAGNOSIS — M9902 Segmental and somatic dysfunction of thoracic region: Secondary | ICD-10-CM

## 2015-02-23 DIAGNOSIS — M9904 Segmental and somatic dysfunction of sacral region: Secondary | ICD-10-CM

## 2015-02-23 DIAGNOSIS — M9901 Segmental and somatic dysfunction of cervical region: Secondary | ICD-10-CM

## 2015-02-23 DIAGNOSIS — K222 Esophageal obstruction: Secondary | ICD-10-CM

## 2015-02-23 DIAGNOSIS — M999 Biomechanical lesion, unspecified: Secondary | ICD-10-CM

## 2015-02-23 DIAGNOSIS — R634 Abnormal weight loss: Secondary | ICD-10-CM

## 2015-02-23 DIAGNOSIS — R1319 Other dysphagia: Secondary | ICD-10-CM

## 2015-02-23 DIAGNOSIS — M546 Pain in thoracic spine: Secondary | ICD-10-CM

## 2015-02-23 DIAGNOSIS — R1310 Dysphagia, unspecified: Secondary | ICD-10-CM

## 2015-02-23 MED ORDER — RANITIDINE HCL 300 MG PO TABS: 300.0000 mg | ORAL_TABLET | Freq: Every day | ORAL | Status: DC

## 2015-02-23 NOTE — Assessment & Plan Note (Signed)
Patient is seeming to do somewhat better with gaining weight since last exam. We'll continue to monitor. Once again of continues to have difficulty labs will be needed.

## 2015-02-23 NOTE — Progress Notes (Signed)
    History of Present Illness: This is a 34 year old male with a history of ulcerative esophagitis and esophageal strictures. He returns for follow-up after a food impaction treated by Dr. Olevia Perches. Savary dilation was performed to 14 mm. Esophageal biopsies showed granulation tissue with marked acute and chronic inflammation He states his dysphagia has improved however he still has some difficulty swallowing unless he chews his food thoroughly. States he takes Protonix twice daily on most days however he misses his second dose not infrequently.  Current Medications, Allergies, Past Medical History, Past Surgical History, Family History and Social History were reviewed in [employer] record.  Physical Exam: General: Well developed , well nourished, no acute distress Head: Normocephalic and atraumatic Eyes:  sclerae anicteric, EOMI Ears: Normal auditory acuity Mouth: No deformity or lesions Lungs: Clear throughout to auscultation Heart: Regular rate and rhythm; no murmurs, rubs or bruits Abdomen: Soft, non tender and non distended. No masses, hepatosplenomegaly or hernias noted. Normal Bowel sounds Musculoskeletal: Symmetrical with no gross deformities  Pulses:  Normal pulses noted Extremities: No clubbing, cyanosis, edema or deformities noted Neurological: Alert oriented x 4, grossly nonfocal Psychological:  Alert and cooperative. Normal mood and affect  Assessment and Recommendations:  1. Ulcerative esophagitis with recurrent esophageal strictures. Biopsies have not been diagnostic of eosinophilic esophagitis. Continue Protonix 40 mg twice daily taken 30 min before breakfast and dinner. Begin ranitidine 300 mg at bedtime. Closely follow all standard antireflux measures with head of bed elevation. Return office visit in 2 months. Call sooner if dysphagia worsens. I spent 15 minutes of face-to-face time with the patient. Greater than 50% of time was spent counseling and  coordinating care.

## 2015-02-23 NOTE — Patient Instructions (Addendum)
Please continue pantoprazole 40 mg twice daily.  We have sent the following medications to your pharmacy for you to pick up at your convenience: Ranitidine 300 mg every night  Please follow up with Dr Fuller Plan on Monday 04/26/15 at 9:45 am.

## 2015-02-23 NOTE — Progress Notes (Signed)
  [name] [address], [place] [address] Phone: [phone] Subjective:     CC: Thoracic back pain, follow up left wrist pain  [id number] Eric Hood is a 34 y.o. male coming in with complaint of back pain. Patient has had a long history recently. Patient continues to have some mild dull aching throbbing back pain. Patient states sometimes it can be limiting. Overall just some more stiffness. Patient states that since he's been eating better and drinking water better he is feeling much better.   Patient is also following up for left wrist pain. Patient was seen previously and was tendons with the scaphoid bone fracture. Patient states overall doing well, mild pain did not wear brace.   Feeling much better and gained 8 pounds.  Review of Systems: No headache, visual changes, nausea, vomiting, diarrhea, constipation, dizziness, abdominal pain, skin rash, fevers, chills,swollen lymph nodes, body aches, joint swelling, muscle aches, chest pain, shortness of breath, mood changes.   Objective Blood pressure 102/58, pulse 99, height 6' (1.829 m), weight 173 lb (78.472 kg), SpO2 98 %.  General: looks much better HEENT: Pupils equal, extraocular movements intact  Respiratory: Patient's speak in full sentences and does not appear short of breath  Cardiovascular: No lower extremity edema, non tender, no erythema  Skin: Warm dry intact with no signs of infection or rash on extremities or on axial skeleton.  Abdomen: Soft abdomen non tender  through XII are intact, neurovascularly intact in all extremities with 2+ DTRs and 2+ pulses.  Lymph: No lymphadenopathy of posterior or anterior cervical chain or axillae bilaterally.  Gait normal with good balance and coordination.  MSK:  Non tender with full range of motion and good stability and symmetric strength and tone of shoulders, elbows, hip, knee and ankles bilaterally.  Wrist: Left Inspection normal  with no visible erythema or swelling. ROM smooth and normal with good flexion and extension and ulnar/radial deviation that is symmetrical with opposite wrist. Minimal tenderness over the snuff box No tenderness over Canal of Gu[place]. Strength 5/5 in all directions without pain. Negative Finkelstein, tinel's and phalens. Negative Watson's test. Contralateral wrist unremarkable   OMT Physical Exam  Cervical  C2 flexed rotated and side bent left  Thoracic T5 extended rotated inside that right T8 extended rotated and side bent left  Lumbar L2 flexed rotated and side bent right  Sacrum Left on left  Illium Neutral    Impression and Recommendations:     This case required medical decision making of moderate complexity.

## 2015-02-23 NOTE — Progress Notes (Signed)
Pre visit review using our clinic review tool, if applicable. No additional management support is needed unless otherwise documented below in the visit note. 

## 2015-02-23 NOTE — Patient Instructions (Signed)
Good to see you Biggest change yet Doing much better Stay hydrated.  Start the exercises again 3 times a week.  We will watch the wrist to make sure doing better Look forward to see what GI has to say.  See me again in 3 weeks.

## 2015-02-23 NOTE — Assessment & Plan Note (Signed)
Decision today to treat with OMT was based on Physical Exam  After verbal consent patient was treated with HVLA, ME, FPR techniques in Cervical, thoracic,  lumbar and sacral areas  Patient tolerated the procedure well with improvement in symptoms  Patient given exercises, stretches and lifestyle modifications  See medications in patient instructions if given  Patient will follow up in 2 weeks

## 2015-02-23 NOTE — Assessment & Plan Note (Signed)
Patient overall seems to be doing somewhat better. She did respond well to osteopathic manipulation. We discussed icing and home exercises. We discussed range of motion exercises and patient will start doing these on a more regular basis. Patient is going to be following up with gastroenterology today and we'll see what other ideas that have to keep him with [place] weight. Patient will continue with the protein supplementations. Patient will come back and see me again in 3 weeks for further evaluation and treatment.

## 2015-03-16 ENCOUNTER — Ambulatory Visit (INDEPENDENT_AMBULATORY_CARE_PROVIDER_SITE_OTHER): Payer: 59 | Admitting: Family Medicine

## 2015-03-16 ENCOUNTER — Encounter: Payer: Self-pay | Admitting: Family Medicine

## 2015-03-16 VITALS — BP 116/70 | HR 106 | Ht 72.0 in | Wt 171.0 lb

## 2015-03-16 DIAGNOSIS — M9902 Segmental and somatic dysfunction of thoracic region: Secondary | ICD-10-CM | POA: Diagnosis not present

## 2015-03-16 DIAGNOSIS — M9904 Segmental and somatic dysfunction of sacral region: Secondary | ICD-10-CM | POA: Diagnosis not present

## 2015-03-16 DIAGNOSIS — M9901 Segmental and somatic dysfunction of cervical region: Secondary | ICD-10-CM

## 2015-03-16 DIAGNOSIS — M9903 Segmental and somatic dysfunction of lumbar region: Secondary | ICD-10-CM

## 2015-03-16 DIAGNOSIS — M546 Pain in thoracic spine: Secondary | ICD-10-CM

## 2015-03-16 DIAGNOSIS — M999 Biomechanical lesion, unspecified: Secondary | ICD-10-CM

## 2015-03-16 NOTE — Progress Notes (Signed)
Pre visit review using our clinic review tool, if applicable. No additional management support is needed unless otherwise documented below in the visit note. 

## 2015-03-16 NOTE — Patient Instructions (Signed)
Continue with the food and hold on the vitamins Ice when you need it Will take time to build up endurance Try to start the posture exercises again See me again in 4-5 weeks.

## 2015-03-16 NOTE — Progress Notes (Signed)
  [name] [address] [place], [place] [address] Phone: [phone] Subjective:     CC: Thoracic back pain, follow up left wrist pain  [id number] Eric Hood is a 34 y.o. male coming in with complaint of back pain. Patient has had a long history recently. Patient continues to have some mild dull aching throbbing back pain. Due to other medical illnesses recently patient has not been as active as he should be. Patient states he continues to do relatively better. Patient states that his stomach is under control and he continues to gain weight. Patient states that he does have a good appetite. Is not taking the vitamins on a regular basis.   Review of Systems: No headache, visual changes, nausea, vomiting, diarrhea, constipation, dizziness, abdominal pain, skin rash, fevers, chills,swollen lymph nodes, body aches, joint swelling, muscle aches, chest pain, shortness of breath, mood changes.   Objective Blood pressure 116/70, pulse 106, height 6' (1.829 m), weight 171 lb (77.565 kg), SpO2 98 %.  General: looks much better HEENT: Pupils equal, extraocular movements intact  Respiratory: Patient's speak in full sentences and does not appear short of breath  Cardiovascular: No lower extremity edema, non tender, no erythema  Skin: Warm dry intact with no signs of infection or rash on extremities or on axial skeleton.  Abdomen: Soft abdomen non tender  through XII are intact, neurovascularly intact in all extremities with 2+ DTRs and 2+ pulses.  Lymph: No lymphadenopathy of posterior or anterior cervical chain or axillae bilaterally.  Gait normal with good balance and coordination.  MSK:  Non tender with full range of motion and good stability and symmetric strength and tone of shoulders, elbows, hip, knee and ankles bilaterally.  Wrist: Left Inspection normal with no visible erythema or swelling. ROM smooth and normal with good flexion and extension and  ulnar/radial deviation that is symmetrical with opposite wrist. Minimal tenderness over the snuff box No tenderness over Canal of [place]. Strength 5/5 in all directions without pain. Negative Finkelstein, tinel's and phalens. Negative Watson's test. Contralateral wrist unremarkable   OMT Physical Exam  Cervical  C2 flexed rotated and side bent left  Thoracic T5 extended rotated inside that right T8 extended rotated and side bent left  Lumbar L2 flexed rotated and side bent right  Sacrum Left on left  Illium Neutral    Impression and Recommendations:     This case required medical decision making of moderate complexity.

## 2015-03-16 NOTE — Assessment & Plan Note (Signed)
Patient has made some significant strides over the last 2 weeks. Discussed with patient to get him back working on a regular basis. Patient will likely have some endurance problems at first. Then patient will do more of an icing pedicle. We discussed home exercises and starting the postural exercises again. Patient and will follow-up with me again in 4 weeks.

## 2015-03-16 NOTE — Assessment & Plan Note (Signed)
Decision today to treat with OMT was based on Physical Exam  After verbal consent patient was treated with HVLA, ME, FPR techniques in Cervical, thoracic,  lumbar and sacral areas  Patient tolerated the procedure well with improvement in symptoms  Patient given exercises, stretches and lifestyle modifications  See medications in patient instructions if given  Patient will follow up in 4 weeks

## 2015-04-13 ENCOUNTER — Ambulatory Visit: Payer: 59 | Admitting: Family Medicine

## 2015-04-13 DIAGNOSIS — Z0289 Encounter for other administrative examinations: Secondary | ICD-10-CM

## 2015-04-26 ENCOUNTER — Ambulatory Visit: Payer: 59 | Admitting: Gastroenterology

## 2015-05-12 ENCOUNTER — Ambulatory Visit (INDEPENDENT_AMBULATORY_CARE_PROVIDER_SITE_OTHER): Payer: Self-pay | Admitting: Family Medicine

## 2015-05-12 ENCOUNTER — Encounter: Payer: Self-pay | Admitting: Family Medicine

## 2015-05-12 ENCOUNTER — Ambulatory Visit: Payer: 59 | Admitting: Family Medicine

## 2015-05-12 VITALS — BP 116/76 | HR 88 | Ht 72.0 in | Wt 182.0 lb

## 2015-05-12 DIAGNOSIS — M999 Biomechanical lesion, unspecified: Secondary | ICD-10-CM

## 2015-05-12 DIAGNOSIS — M9901 Segmental and somatic dysfunction of cervical region: Secondary | ICD-10-CM

## 2015-05-12 DIAGNOSIS — M546 Pain in thoracic spine: Secondary | ICD-10-CM

## 2015-05-12 DIAGNOSIS — M9902 Segmental and somatic dysfunction of thoracic region: Secondary | ICD-10-CM

## 2015-05-12 DIAGNOSIS — M9904 Segmental and somatic dysfunction of sacral region: Secondary | ICD-10-CM

## 2015-05-12 DIAGNOSIS — M9903 Segmental and somatic dysfunction of lumbar region: Secondary | ICD-10-CM

## 2015-05-12 MED ORDER — BACLOFEN 10 MG PO TABS: 10.0000 mg | ORAL_TABLET | Freq: Three times a day (TID) | ORAL | Status: DC

## 2015-05-12 NOTE — Assessment & Plan Note (Signed)
Decision today to treat with OMT was based on Physical Exam  After verbal consent patient was treated with HVLA, ME, FPR techniques in Cervical, thoracic,  lumbar and sacral areas  Patient tolerated the procedure well with improvement in symptoms  Patient given exercises, stretches and lifestyle modifications  See medications in patient instructions if given  Patient will follow up in 4-6 weeks

## 2015-05-12 NOTE — Patient Instructions (Addendum)
Good to see  You  Ice can help when needed New exercises if back hurts low.  Will give you a little muscle relaxer to have on hand if needed.  See me again in 6 weeks.

## 2015-05-12 NOTE — Progress Notes (Signed)
Pre visit review using our clinic review tool, if applicable. No additional management support is needed unless otherwise documented below in the visit note. 

## 2015-05-12 NOTE — Progress Notes (Signed)
  [name] [address], [place] [address] Phone: [phone] Subjective:     CC: Thoracic back pain, follow up left wrist pain  [id number] Eric Hood is a 34 y.o. male coming in with complaint of back pain. Patient has had a long history recently. Patient continues to have some mild dull aching throbbing back pain. Patient unfortunately has lost his health insurance recently. That is why he has not been seen for 2 months. States that he is having some mild increase in lower back pain. States that it is more of a tightness. States though that is very seldom. Staying active and is continuing to work on a fairly regular basis. More stomach discomfort recently.   Review of Systems: No headache, visual changes, nausea, vomiting, diarrhea, constipation, dizziness, abdominal pain, skin rash, fevers, chills,swollen lymph nodes, body aches, joint swelling, muscle aches, chest pain, shortness of breath, mood changes.   Objective Blood pressure 116/76, pulse 88, height 6' (1.829 m), weight 182 lb (82.555 kg), SpO2 97 %.  General: looks much better HEENT: Pupils equal, extraocular movements intact  Respiratory: Patient's speak in full sentences and does not appear short of breath  Cardiovascular: No lower extremity edema, non tender, no erythema  Skin: Warm dry intact with no signs of infection or rash on extremities or on axial skeleton.  Abdomen: Soft abdomen non tender  through XII are intact, neurovascularly intact in all extremities with 2+ DTRs and 2+ pulses.  Lymph: No lymphadenopathy of posterior or anterior cervical chain or axillae bilaterally.  Gait normal with good balance and coordination.  MSK:  Non tender with full range of motion and good stability and symmetric strength and tone of shoulders, wrist, elbows, hip, knee and ankles bilaterally.  Mild increasing kyphosis  OMT Physical Exam  Cervical  C2 flexed rotated and side bent  left  Thoracic T5 extended rotated inside that right T8 extended rotated and side bent left  Lumbar L2 flexed rotated and side bent right with severely tight hip flexor  Sacrum Left on left  Illium Neutral    Impression and Recommendations:     This case required medical decision making of moderate complexity.

## 2015-05-12 NOTE — Assessment & Plan Note (Signed)
Patient continues to try to improve overall but continues to have the discomfort from time to time. I do think that some of this is psychosomatic. We discussed different treatment options and patient given a low dose of a muscle relaxer if needed. Patient has had a history of difficult he with pain medications and we will avoid these. We discussed icing regimen and different ergonomic changes it could be beneficial. Patient given home exercises for more of tight hip flexors as well. Patient and will come back and see me again in 3-4 weeks for further evaluation and treatment. Due to patient not having insurance we discussed spacing out to 6 weeks if necessary.

## 2015-05-15 ENCOUNTER — Inpatient Hospital Stay (HOSPITAL_COMMUNITY): Payer: Self-pay

## 2015-05-15 ENCOUNTER — Emergency Department (HOSPITAL_COMMUNITY): Payer: Self-pay

## 2015-05-15 ENCOUNTER — Encounter (HOSPITAL_COMMUNITY): Payer: Self-pay

## 2015-05-15 ENCOUNTER — Inpatient Hospital Stay (HOSPITAL_COMMUNITY)
Admission: EM | Admit: 2015-05-15 | Discharge: 2015-05-20 | DRG: 208 | Disposition: A | Discharge status: Home / Self Care | Payer: Self-pay | Attending: Internal Medicine | Admitting: Internal Medicine

## 2015-05-15 DIAGNOSIS — R0600 Dyspnea, unspecified: Secondary | ICD-10-CM | POA: Diagnosis not present

## 2015-05-15 DIAGNOSIS — R062 Wheezing: Secondary | ICD-10-CM

## 2015-05-15 DIAGNOSIS — D509 Iron deficiency anemia, unspecified: Secondary | ICD-10-CM | POA: Diagnosis present

## 2015-05-15 DIAGNOSIS — J9602 Acute respiratory failure with hypercapnia: Secondary | ICD-10-CM | POA: Diagnosis present

## 2015-05-15 DIAGNOSIS — E876 Hypokalemia: Secondary | ICD-10-CM | POA: Diagnosis not present

## 2015-05-15 DIAGNOSIS — G8929 Other chronic pain: Secondary | ICD-10-CM | POA: Diagnosis present

## 2015-05-15 DIAGNOSIS — A419 Sepsis, unspecified organism: Secondary | ICD-10-CM | POA: Diagnosis present

## 2015-05-15 DIAGNOSIS — E872 Acidosis: Secondary | ICD-10-CM | POA: Diagnosis present

## 2015-05-15 DIAGNOSIS — R197 Diarrhea, unspecified: Secondary | ICD-10-CM | POA: Diagnosis not present

## 2015-05-15 DIAGNOSIS — I959 Hypotension, unspecified: Secondary | ICD-10-CM | POA: Diagnosis present

## 2015-05-15 DIAGNOSIS — F410 Panic disorder [episodic paroxysmal anxiety] without agoraphobia: Secondary | ICD-10-CM | POA: Diagnosis present

## 2015-05-15 DIAGNOSIS — M549 Dorsalgia, unspecified: Secondary | ICD-10-CM | POA: Diagnosis present

## 2015-05-15 DIAGNOSIS — J9601 Acute respiratory failure with hypoxia: Secondary | ICD-10-CM | POA: Diagnosis present

## 2015-05-15 DIAGNOSIS — K449 Diaphragmatic hernia without obstruction or gangrene: Secondary | ICD-10-CM | POA: Diagnosis present

## 2015-05-15 DIAGNOSIS — Z221 Carrier of other intestinal infectious diseases: Secondary | ICD-10-CM

## 2015-05-15 DIAGNOSIS — T424X1A Poisoning by benzodiazepines, accidental (unintentional), initial encounter: Secondary | ICD-10-CM | POA: Diagnosis present

## 2015-05-15 DIAGNOSIS — J690 Pneumonitis due to inhalation of food and vomit: Principal | ICD-10-CM | POA: Diagnosis present

## 2015-05-15 DIAGNOSIS — R1310 Dysphagia, unspecified: Secondary | ICD-10-CM | POA: Diagnosis present

## 2015-05-15 DIAGNOSIS — S27309A Unspecified injury of lung, unspecified, initial encounter: Secondary | ICD-10-CM | POA: Insufficient documentation

## 2015-05-15 DIAGNOSIS — D500 Iron deficiency anemia secondary to blood loss (chronic): Secondary | ICD-10-CM | POA: Diagnosis present

## 2015-05-15 DIAGNOSIS — R0603 Acute respiratory distress: Secondary | ICD-10-CM

## 2015-05-15 DIAGNOSIS — K2211 Ulcer of esophagus with bleeding: Secondary | ICD-10-CM | POA: Diagnosis present

## 2015-05-15 DIAGNOSIS — T40601A Poisoning by unspecified narcotics, accidental (unintentional), initial encounter: Secondary | ICD-10-CM

## 2015-05-15 DIAGNOSIS — G9340 Encephalopathy, unspecified: Secondary | ICD-10-CM | POA: Diagnosis present

## 2015-05-15 DIAGNOSIS — J9690 Respiratory failure, unspecified, unspecified whether with hypoxia or hypercapnia: Secondary | ICD-10-CM | POA: Diagnosis present

## 2015-05-15 DIAGNOSIS — K219 Gastro-esophageal reflux disease without esophagitis: Secondary | ICD-10-CM | POA: Diagnosis present

## 2015-05-15 DIAGNOSIS — K222 Esophageal obstruction: Secondary | ICD-10-CM | POA: Diagnosis present

## 2015-05-15 DIAGNOSIS — E538 Deficiency of other specified B group vitamins: Secondary | ICD-10-CM | POA: Diagnosis present

## 2015-05-15 LAB — COMPREHENSIVE METABOLIC PANEL
ALK PHOS: 68 U/L (ref 38–126)
ALT: 18 U/L (ref 17–63)
ALT: 22 U/L (ref 17–63)
ANION GAP: 10 (ref 5–15)
AST: 27 U/L (ref 15–41)
AST: 27 U/L (ref 15–41)
Albumin: 3.3 g/dL — ABNORMAL LOW (ref 3.5–5.0)
Albumin: 3.6 g/dL (ref 3.5–5.0)
Alkaline Phosphatase: 71 U/L (ref 38–126)
Anion gap: 7 (ref 5–15)
BUN: 10 mg/dL (ref 6–20)
BUN: 8 mg/dL (ref 6–20)
CHLORIDE: 103 mmol/L (ref 101–111)
CO2: 26 mmol/L (ref 22–32)
CO2: 27 mmol/L (ref 22–32)
CREATININE: 0.99 mg/dL (ref 0.61–1.24)
Calcium: 8.4 mg/dL — ABNORMAL LOW (ref 8.9–10.3)
Calcium: 8.6 mg/dL — ABNORMAL LOW (ref 8.9–10.3)
Chloride: 106 mmol/L (ref 101–111)
Creatinine, Ser: 1.18 mg/dL (ref 0.61–1.24)
GLUCOSE: 151 mg/dL — AB (ref 65–99)
GLUCOSE: 136 mg/dL — AB (ref 65–99)
POTASSIUM: 5.1 mmol/L (ref 3.5–5.1)
Potassium: 3.8 mmol/L (ref 3.5–5.1)
SODIUM: 139 mmol/L (ref 135–145)
SODIUM: 140 mmol/L (ref 135–145)
TOTAL PROTEIN: 6 g/dL — AB (ref 6.5–8.1)
Total Bilirubin: 0.3 mg/dL (ref 0.3–1.2)
Total Bilirubin: 0.5 mg/dL (ref 0.3–1.2)
Total Protein: 6.4 g/dL — ABNORMAL LOW (ref 6.5–8.1)

## 2015-05-15 LAB — GLUCOSE, CAPILLARY
GLUCOSE-CAPILLARY: 111 mg/dL — AB (ref 65–99)
Glucose-Capillary: 96 mg/dL (ref 65–99)

## 2015-05-15 LAB — MRSA PCR SCREENING: MRSA by PCR: NEGATIVE

## 2015-05-15 LAB — TROPONIN I
Troponin I: 0.06 ng/mL — ABNORMAL HIGH (ref <0.031)
Troponin I: 0.11 ng/mL — ABNORMAL HIGH (ref <0.031)

## 2015-05-15 LAB — POCT I-STAT 3, ART BLOOD GAS (G3+)
Acid-Base Excess: 2 mmol/L (ref 0.0–2.0)
Bicarbonate: 27 mEq/L — ABNORMAL HIGH (ref 20.0–24.0)
O2 Saturation: 100 %
PCO2 ART: 50.1 mmHg — AB (ref 35.0–45.0)
Patient temperature: 103.3
TCO2: 28 mmol/L (ref 0–100)
pH, Arterial: 7.35 (ref 7.350–7.450)
pO2, Arterial: 447 mmHg — ABNORMAL HIGH (ref 80.0–100.0)

## 2015-05-15 LAB — RAPID URINE DRUG SCREEN, HOSP PERFORMED
Amphetamines: NOT DETECTED
Barbiturates: NOT DETECTED
Benzodiazepines: POSITIVE — AB
Cocaine: NOT DETECTED
OPIATES: POSITIVE — AB
Tetrahydrocannabinol: NOT DETECTED

## 2015-05-15 LAB — MAGNESIUM: Magnesium: 1.5 mg/dL — ABNORMAL LOW (ref 1.7–2.4)

## 2015-05-15 LAB — CBC
HCT: 40.3 % (ref 39.0–52.0)
HEMATOCRIT: 39.2 % (ref 39.0–52.0)
HEMOGLOBIN: 11.7 g/dL — AB (ref 13.0–17.0)
Hemoglobin: 12.2 g/dL — ABNORMAL LOW (ref 13.0–17.0)
MCH: 23.7 pg — AB (ref 26.0–34.0)
MCH: 23.9 pg — AB (ref 26.0–34.0)
MCHC: 29.8 g/dL — ABNORMAL LOW (ref 30.0–36.0)
MCHC: 30.3 g/dL (ref 30.0–36.0)
MCV: 79.4 fL (ref 78.0–100.0)
MCV: 79 fL (ref 78.0–100.0)
PLATELETS: 342 10*3/uL (ref 150–400)
Platelets: 247 10*3/uL (ref 150–400)
RBC: 4.94 MIL/uL (ref 4.22–5.81)
RBC: 5.1 MIL/uL (ref 4.22–5.81)
RDW: 16.6 % — ABNORMAL HIGH (ref 11.5–15.5)
RDW: 16.4 % — ABNORMAL HIGH (ref 11.5–15.5)
WBC: 7.9 10*3/uL (ref 4.0–10.5)
WBC: 10.7 10*3/uL — ABNORMAL HIGH (ref 4.0–10.5)

## 2015-05-15 LAB — LIPASE, BLOOD: Lipase: 15 U/L — ABNORMAL LOW (ref 22–51)

## 2015-05-15 LAB — LACTIC ACID, PLASMA
LACTIC ACID, VENOUS: 5.5 mmol/L — AB (ref 0.5–2.0)
Lactic Acid, Venous: 3.9 mmol/L (ref 0.5–2.0)

## 2015-05-15 LAB — PROTIME-INR
INR: 1.06 (ref 0.00–1.49)
Prothrombin Time: 14 seconds (ref 11.6–15.2)

## 2015-05-15 LAB — I-STAT ARTERIAL BLOOD GAS, ED
Acid-base deficit: 3 mmol/L — ABNORMAL HIGH (ref 0.0–2.0)
Bicarbonate: 26 mEq/L — ABNORMAL HIGH (ref 20.0–24.0)
O2 Saturation: 80 %
TCO2: 28 mmol/L (ref 0–100)
pCO2 arterial: 64.9 mmHg (ref 35.0–45.0)
pH, Arterial: 7.212 — ABNORMAL LOW (ref 7.350–7.450)
pO2, Arterial: 55 mmHg — ABNORMAL LOW (ref 80.0–100.0)

## 2015-05-15 LAB — SALICYLATE LEVEL: Salicylate Lvl: <4 mg/dL (ref 2.8–30.0)

## 2015-05-15 LAB — POC OCCULT BLOOD, ED: Fecal Occult Bld: NEGATIVE

## 2015-05-15 LAB — I-STAT TROPONIN, ED: Troponin i, poc: 0.01 ng/mL (ref 0.00–0.08)

## 2015-05-15 LAB — CORTISOL: Cortisol, Plasma: 27.7 ug/dL

## 2015-05-15 LAB — ACETAMINOPHEN LEVEL: Acetaminophen (Tylenol), Serum: <10 ug/mL — ABNORMAL LOW (ref 10–30)

## 2015-05-15 LAB — I-STAT CG4 LACTIC ACID, ED: LACTIC ACID, VENOUS: 2.55 mmol/L — AB (ref 0.5–2.0)

## 2015-05-15 LAB — APTT: aPTT: 26 seconds (ref 24–37)

## 2015-05-15 LAB — TYPE AND SCREEN
ABO/RH(D): O POS
ANTIBODY SCREEN: NEGATIVE

## 2015-05-15 LAB — PROCALCITONIN: Procalcitonin: <0.1 ng/mL

## 2015-05-15 LAB — BRAIN NATRIURETIC PEPTIDE: B Natriuretic Peptide: 17.1 pg/mL (ref 0.0–100.0)

## 2015-05-15 LAB — AMYLASE: Amylase: 58 U/L (ref 28–100)

## 2015-05-15 LAB — TRIGLYCERIDES: Triglycerides: 45 mg/dL (ref <150)

## 2015-05-15 LAB — PHOSPHORUS: PHOSPHORUS: 4.1 mg/dL (ref 2.5–4.6)

## 2015-05-15 LAB — STREP PNEUMONIAE URINARY ANTIGEN: STREP PNEUMO URINARY ANTIGEN: NEGATIVE

## 2015-05-15 MED ORDER — FENTANYL CITRATE (PF) 100 MCG/2ML IJ SOLN
100.0000 ug | INTRAMUSCULAR | Status: AC | PRN
  Administered 2015-05-15 (×3): 100 ug via INTRAVENOUS
  Filled 2015-05-15 (×4): qty 2

## 2015-05-15 MED ORDER — SODIUM CHLORIDE 0.9 % IV BOLUS (SEPSIS)
1000.0000 mL | Freq: Once | INTRAVENOUS | Status: AC
  Administered 2015-05-15: 1000 mL via INTRAVENOUS

## 2015-05-15 MED ORDER — IPRATROPIUM-ALBUTEROL 0.5-2.5 (3) MG/3ML IN SOLN
3.0000 mL | Freq: Four times a day (QID) | RESPIRATORY_TRACT | Status: DC
  Administered 2015-05-15 – 2015-05-18 (×10): 3 mL via RESPIRATORY_TRACT
  Filled 2015-05-15 (×10): qty 3

## 2015-05-15 MED ORDER — AMPICILLIN-SULBACTAM SODIUM 3 (2-1) G IJ SOLR
3.0000 g | Freq: Four times a day (QID) | INTRAMUSCULAR | Status: AC
  Administered 2015-05-15 – 2015-05-19 (×18): 3 g via INTRAVENOUS
  Filled 2015-05-15 (×20): qty 3

## 2015-05-15 MED ORDER — FUROSEMIDE 10 MG/ML IJ SOLN
40.0000 mg | Freq: Once | INTRAMUSCULAR | Status: AC
  Administered 2015-05-15: 40 mg via INTRAVENOUS
  Filled 2015-05-15: qty 4

## 2015-05-15 MED ORDER — FENTANYL CITRATE (PF) 100 MCG/2ML IJ SOLN
100.0000 ug | INTRAMUSCULAR | Status: DC | PRN
  Administered 2015-05-15 – 2015-05-17 (×4): 100 ug via INTRAVENOUS
  Filled 2015-05-15 (×5): qty 2

## 2015-05-15 MED ORDER — ROCURONIUM BROMIDE 50 MG/5ML IV SOLN
INTRAVENOUS | Status: AC | PRN
  Administered 2015-05-15: 100 mg via INTRAVENOUS

## 2015-05-15 MED ORDER — ROCURONIUM BROMIDE 50 MG/5ML IV SOLN
INTRAVENOUS | Status: AC
  Filled 2015-05-15: qty 2

## 2015-05-15 MED ORDER — NOREPINEPHRINE BITARTRATE 1 MG/ML IV SOLN
2.0000 ug/min | INTRAVENOUS | Status: DC
  Administered 2015-05-15: 2 ug/min via INTRAVENOUS
  Administered 2015-05-16: 4 ug/min via INTRAVENOUS
  Filled 2015-05-15 (×2): qty 4

## 2015-05-15 MED ORDER — HYDROMORPHONE HCL 1 MG/ML IJ SOLN
2.0000 mg | Freq: Once | INTRAMUSCULAR | Status: AC
  Administered 2015-05-15: 2 mg via INTRAVENOUS
  Filled 2015-05-15: qty 2

## 2015-05-15 MED ORDER — SODIUM CHLORIDE 0.9 % IV SOLN
80.0000 mg | Freq: Once | INTRAVENOUS | Status: AC
  Administered 2015-05-15: 80 mg via INTRAVENOUS
  Filled 2015-05-15: qty 80

## 2015-05-15 MED ORDER — IPRATROPIUM BROMIDE 0.02 % IN SOLN
0.5000 mg | Freq: Once | RESPIRATORY_TRACT | Status: AC
  Administered 2015-05-15: 0.5 mg via RESPIRATORY_TRACT
  Filled 2015-05-15: qty 2.5

## 2015-05-15 MED ORDER — ONDANSETRON HCL 4 MG/2ML IJ SOLN
4.0000 mg | Freq: Four times a day (QID) | INTRAMUSCULAR | Status: DC | PRN
  Administered 2015-05-15 – 2015-05-17 (×2): 4 mg via INTRAVENOUS
  Filled 2015-05-15 (×2): qty 2

## 2015-05-15 MED ORDER — IOHEXOL 300 MG/ML  SOLN
80.0000 mL | Freq: Once | INTRAMUSCULAR | Status: AC | PRN
  Administered 2015-05-15: 80 mL via INTRAVENOUS

## 2015-05-15 MED ORDER — SODIUM CHLORIDE 0.9 % IV SOLN
INTRAVENOUS | Status: DC
  Administered 2015-05-15 – 2015-05-16 (×2): via INTRAVENOUS

## 2015-05-15 MED ORDER — ALBUTEROL SULFATE (2.5 MG/3ML) 0.083% IN NEBU
2.5000 mg | INHALATION_SOLUTION | RESPIRATORY_TRACT | Status: DC | PRN
Start: 2015-05-15 — End: 2015-05-20

## 2015-05-15 MED ORDER — SUCCINYLCHOLINE CHLORIDE 20 MG/ML IJ SOLN
INTRAMUSCULAR | Status: AC
  Filled 2015-05-15: qty 1

## 2015-05-15 MED ORDER — LIDOCAINE HCL (CARDIAC) 20 MG/ML IV SOLN
INTRAVENOUS | Status: AC
  Filled 2015-05-15: qty 5

## 2015-05-15 MED ORDER — CHLORHEXIDINE GLUCONATE 0.12 % MT SOLN
15.0000 mL | Freq: Two times a day (BID) | OROMUCOSAL | Status: DC
  Administered 2015-05-15 – 2015-05-20 (×9): 15 mL via OROMUCOSAL
  Filled 2015-05-15 (×6): qty 15

## 2015-05-15 MED ORDER — SODIUM CHLORIDE 0.9 % IV SOLN
8.0000 mg/h | INTRAVENOUS | Status: AC
  Administered 2015-05-15 – 2015-05-18 (×6): 8 mg/h via INTRAVENOUS
  Filled 2015-05-15 (×14): qty 80

## 2015-05-15 MED ORDER — ETOMIDATE 2 MG/ML IV SOLN
INTRAVENOUS | Status: AC
  Filled 2015-05-15: qty 20

## 2015-05-15 MED ORDER — ACETAMINOPHEN 650 MG RE SUPP
650.0000 mg | Freq: Four times a day (QID) | RECTAL | Status: DC | PRN
  Administered 2015-05-15 – 2015-05-16 (×2): 650 mg via RECTAL
  Filled 2015-05-15 (×2): qty 1

## 2015-05-15 MED ORDER — ONDANSETRON HCL 4 MG/2ML IJ SOLN
4.0000 mg | Freq: Once | INTRAMUSCULAR | Status: AC
  Administered 2015-05-15: 4 mg via INTRAVENOUS
  Filled 2015-05-15: qty 2

## 2015-05-15 MED ORDER — CETYLPYRIDINIUM CHLORIDE 0.05 % MT LIQD
7.0000 mL | Freq: Four times a day (QID) | OROMUCOSAL | Status: DC
  Administered 2015-05-16 – 2015-05-19 (×11): 7 mL via OROMUCOSAL

## 2015-05-15 MED ORDER — ALBUTEROL SULFATE (2.5 MG/3ML) 0.083% IN NEBU
5.0000 mg | INHALATION_SOLUTION | Freq: Once | RESPIRATORY_TRACT | Status: AC
  Administered 2015-05-15: 5 mg via RESPIRATORY_TRACT
  Filled 2015-05-15: qty 6

## 2015-05-15 MED ORDER — PROPOFOL 1000 MG/100ML IV EMUL
5.0000 ug/kg/min | INTRAVENOUS | Status: DC
  Administered 2015-05-15: 30 ug/kg/min via INTRAVENOUS
  Administered 2015-05-15: 55 ug/kg/min via INTRAVENOUS
  Administered 2015-05-15: 15 ug/kg/min via INTRAVENOUS
  Administered 2015-05-16 (×2): 70 ug/kg/min via INTRAVENOUS
  Administered 2015-05-16: 65 ug/kg/min via INTRAVENOUS
  Administered 2015-05-16 – 2015-05-17 (×8): 70 ug/kg/min via INTRAVENOUS
  Filled 2015-05-15 (×15): qty 100

## 2015-05-15 MED ORDER — ETOMIDATE 2 MG/ML IV SOLN
INTRAVENOUS | Status: AC | PRN
Start: 2015-05-15 — End: 2015-05-15
  Administered 2015-05-15: 20 mg via INTRAVENOUS

## 2015-05-15 NOTE — Progress Notes (Signed)
ANTIBIOTIC CONSULT NOTE - INITIAL  Pharmacy Consult for Unasyn Indication: rule out pneumonia  No Known Allergies  Vital Signs: Temp: 97.9 F (36.6 C) (07/30 1320) Temp Source: Rectal (07/30 1320) BP: 112/70 mmHg (07/30 1425) Pulse Rate: 134 (07/30 1425) Intake/Output from previous day:   Intake/Output from this shift:    Labs:  Recent Labs  05/15/15 1308  WBC 7.9  HGB 11.7*  PLT 247  CREATININE 1.18   Estimated Creatinine Clearance: 98.6 mL/min (by C-G formula based on Cr of 1.18). No results for input(s): VANCOTROUGH, VANCOPEAK, VANCORANDOM, GENTTROUGH, GENTPEAK, GENTRANDOM, TOBRATROUGH, TOBRAPEAK, TOBRARND, AMIKACINPEAK, AMIKACINTROU, AMIKACIN in the last 72 hours.   Microbiology: No results found for this or any previous visit (from the past 720 hour(s)).  Medical History: Past Medical History  Diagnosis Date  . Back pain   . Reflux   . Esophageal stricture 03/2014  . Hiatal hernia   . Ulcerative esophagitis 03/2014    severe  . Anxiety   . GERD (gastroesophageal reflux disease)   . Anemia 10/23/2014    Medications:  See home med list - pantoprazole, baclofen, ranitidine Assessment: 34 year old man admitted to the ED for respiratory distress after heroin overdose.  Unasyn to start for possible aspiration pneumonia  Goal of Therapy:  treat infection  Plan:  Follow up culture results Unasyn 3g IV q6h  Follow renal function  Candie Mile 05/15/2015,2:29 PM

## 2015-05-15 NOTE — Code Documentation (Signed)
Per Critical Care MD McQuaid pt to be intubated, Pt a x 4, agrees with plan of care. Preparing for intubation at this time.

## 2015-05-15 NOTE — Care Management Note (Addendum)
Case Management Note  Patient Details  Name: Tyee Vandevoorde MRN: [id number] Date of Birth: July 08, 1981  Subjective/Objective:   34 y.o. M admitted to ICU after intubation(Hypercarbic/hypoxic) as a result of Heroin use. Lives alone in Private residence .                  Action/Plan: Will refer to CSW for management after begins to clear medically. Will also need PCP assist and is self pay.    Expected Discharge Date:                  Expected Discharge Plan:     In-House Referral:     Discharge planning Services     Post Acute Care Choice:    Choice offered to:     DME Arranged:    DME Agency:     HH Arranged:    [place] Agency:     Status of Service:     Medicare Important Message Given:    Date Medicare IM Given:    Medicare IM give by:    Date Additional Medicare IM Given:    Additional Medicare Important Message give by:     If discussed at [place] of Stay Meetings, dates discussed:    Additional Comments:  Delrae Sawyers, RN 05/15/2015, 2:42 PM

## 2015-05-15 NOTE — Progress Notes (Signed)
Pt had episode of vomiting, bright red vomit from mouth and ETT inline suction. Pt wrote on paper that he was "throwing up". Elink MD ordered Zofran, Med given as ordered. Also, another liter of NS ordered for BP, which is now 92/51, MAP of 61. Pt alert, wrote down father's phone number on paper and asked me to call him and see if he will come in. I will call patient's father. Pt also attempting to pull out tube after writing down number, mitts reapplied.  Will closely monitor this patient.

## 2015-05-15 NOTE — ED Provider Notes (Signed)
CSN: [id number]     Arrival date & time 05/15/15  1247 History   First MD Initiated Contact with Patient 05/15/15 1252     Chief Complaint  Patient presents with  . Drug Overdose     (Consider location/radiation/quality/duration/timing/severity/associated sxs/prior Treatment) HPI  Eric Hood Is a 34 year old male who presents to the emergency department in respiratory distress. EMS was called out to the patient's house for respiratory distress. He has a past medical history of acute anemia secondary to erosive esophagitis, heroin abuse, hiatal hernia, chronic back pain.  He was snorting heroine today. He states that he passed out from the heroin and when he woke up he had vomited. He also believes he was coughing up blood. EMS found about 50 mL of blood in his bathtub. She felt that he took "bad heroin." There is a level V caveat due to respiratory distress and history is predominantly obtained from medical records and EMS. Medical records show that the patient has had decreased appetite, chronic inflammation and reflux despite medical intervention. He is currently being worked up for underlying chronic illnesses by his PCP.    Past Medical History  Diagnosis Date  . Back pain   . Reflux   . Esophageal stricture 03/2014  . Hiatal hernia   . Ulcerative esophagitis 03/2014    severe  . Anxiety   . GERD (gastroesophageal reflux disease)   . Anemia 10/23/2014   Past Surgical History  Procedure Laterality Date  . Hand surgery Right   . Esophageal dilation      On 3 occasions: 03/2014, 08/2014, 09/2014  . Esophagogastroduodenoscopy (egd) with propofol N/A 01/18/2015    Procedure: ESOPHAGOGASTRODUODENOSCOPY (EGD) WITH PROPOFOL;  Surgeon: Lafayette Dragon, MD;  Location: [facility];  Service: Endoscopy;  Laterality: N/A;   Family History  Problem Relation Age of Onset  . Hypertension Mother   . Hypertension Father   . Colon cancer Neg Hx   . Esophageal cancer Neg Hx   . Rectal cancer  Neg Hx   . Stomach cancer Neg Hx    History  Substance Use Topics  . Smoking status: Never Smoker   . Smokeless tobacco: Never Used  . Alcohol Use: No    Review of Systems  Unable to perform ROS      Allergies  Review of patient's allergies indicates no known allergies.  Home Medications   Prior to Admission medications   Medication Sig Start Date End Date Taking? Authorizing Provider  baclofen (LIORESAL) 10 MG tablet Take 1 tablet (10 mg total) by mouth 3 (three) times daily. 05/12/15  Yes Lyndal Pulley, DO  pantoprazole (PROTONIX) 40 MG tablet TAKE 1 TABLET (40 MG TOTAL) BY MOUTH 2 (TWO) TIMES DAILY. 02/10/15  Yes Lyndal Pulley, DO  ranitidine (ZANTAC) 300 MG tablet Take 1 tablet (300 mg total) by mouth at bedtime. 02/23/15  Yes Ladene Artist, MD   BP 95/49 mmHg  Pulse 110  Temp(Src) 101.9 F (38.8 C) (Core (Comment))  Resp 20  Ht 6' (1.829 m)  Wt 179 lb 14.3 oz (81.6 kg)  BMI 24.39 kg/m2  SpO2 100% Physical Exam  Constitutional: He is oriented to person, place, and time. He appears distressed.  HENT:  Head: Normocephalic and atraumatic.  Petechial hemorrhage noted around the eyes. Patient with extreme pallor of the face, Mouth, and mucous membranes.  Eyes: EOM are normal. Pupils are equal, round, and reactive to light.  Neck: Normal range of motion. No JVD  present.  Cardiovascular:  Tachycardic, Unable to auscultate heart sounds over lung sounds.  Pulmonary/Chest: He is in respiratory distress. He has wheezes.  Patient in respiratory distress. Using accessory muscles, tachypnea, prolonged expiratory phase.   Abdominal: Soft. He exhibits no distension.  Musculoskeletal: Normal range of motion.  Neurological: He is alert and oriented to person, place, and time.  Skin: Rash (petechial) noted. There is pallor.  Nursing note and vitals reviewed.   ED Course  Procedures (including critical care time) Labs Review Labs Reviewed  COMPREHENSIVE METABOLIC  PANEL - Abnormal; Notable for the following:    Glucose, Bld 151 (*)    Calcium 8.4 (*)    Total Protein 6.0 (*)    Albumin 3.3 (*)    All other components within normal limits  CBC - Abnormal; Notable for the following:    Hemoglobin 11.7 (*)    MCH 23.7 (*)    MCHC 29.8 (*)    RDW 16.6 (*)    All other components within normal limits  URINE RAPID DRUG SCREEN, HOSP PERFORMED - Abnormal; Notable for the following:    Opiates POSITIVE (*)    Benzodiazepines POSITIVE (*)    All other components within normal limits  ACETAMINOPHEN LEVEL - Abnormal; Notable for the following:    Acetaminophen (Tylenol), Serum <10 (*)    All other components within normal limits  COMPREHENSIVE METABOLIC PANEL - Abnormal; Notable for the following:    Glucose, Bld 136 (*)    Calcium 8.6 (*)    Total Protein 6.4 (*)    All other components within normal limits  MAGNESIUM - Abnormal; Notable for the following:    Magnesium 1.5 (*)    All other components within normal limits  LACTIC ACID, PLASMA - Abnormal; Notable for the following:    Lactic Acid, Venous 3.9 (*)    All other components within normal limits  CBC - Abnormal; Notable for the following:    WBC 10.7 (*)    Hemoglobin 12.2 (*)    MCH 23.9 (*)    RDW 16.4 (*)    All other components within normal limits  LIPASE, BLOOD - Abnormal; Notable for the following:    Lipase 15 (*)    All other components within normal limits  TROPONIN I - Abnormal; Notable for the following:    Troponin I 0.06 (*)    All other components within normal limits  GLUCOSE, CAPILLARY - Abnormal; Notable for the following:    Glucose-Capillary 111 (*)    All other components within normal limits  I-STAT ARTERIAL BLOOD GAS, ED - Abnormal; Notable for the following:    pH, Arterial 7.212 (*)    pCO2 arterial 64.9 (*)    pO2, Arterial 55.0 (*)    Bicarbonate 26.0 (*)    Acid-base deficit 3.0 (*)    All other components within normal limits  I-STAT CG4 LACTIC  ACID, ED - Abnormal; Notable for the following:    Lactic Acid, Venous 2.55 (*)    All other components within normal limits  POCT I-STAT 3, ART BLOOD GAS (G3+) - Abnormal; Notable for the following:    pCO2 arterial 50.1 (*)    pO2, Arterial 447.0 (*)    Bicarbonate 27.0 (*)    All other components within normal limits  MRSA PCR SCREENING  CULTURE, BLOOD (ROUTINE X 2)  CULTURE, BLOOD (ROUTINE X 2)  URINE CULTURE  CULTURE, RESPIRATORY (NON-EXPECTORATED)  BRAIN NATRIURETIC PEPTIDE  SALICYLATE LEVEL  PHOSPHORUS  PROCALCITONIN  CORTISOL  PROTIME-INR  APTT  AMYLASE  TRIGLYCERIDES  BLOOD GAS, ARTERIAL  BLOOD GAS, VENOUS  STREP PNEUMONIAE URINARY ANTIGEN  LEGIONELLA ANTIGEN, URINE  BLOOD GAS, ARTERIAL  TROPONIN I  TROPONIN I  CBC  BASIC METABOLIC PANEL  BLOOD GAS, ARTERIAL  LACTIC ACID, PLASMA  I-STAT TROPOININ, ED  POC OCCULT BLOOD, ED  TYPE AND SCREEN    Imaging Review Ct Angio Chest Pe W/cm &/or Wo Cm  05/15/2015   CLINICAL DATA:  Respiratory distress after drug use.  Hypoxia.  EXAM: CT ANGIOGRAPHY CHEST WITH CONTRAST  TECHNIQUE: Multidetector CT imaging of the chest was performed using the standard protocol during bolus administration of intravenous contrast. Multiplanar CT image reconstructions and MIPs were obtained to evaluate the vascular anatomy.  CONTRAST:  2mL OMNIPAQUE IOHEXOL 300 MG/ML  SOLN  COMPARISON:  10/23/2014  FINDINGS: Endotracheal tube in adequate position. Lungs are adequately inflated and demonstrate a moderate bilateral nodular airspace process most prominent over the mid to upper lungs right worse than left. Mild right posterior basilar atelectasis. No effusion. Airways are within normal.  Heart is normal in size. There is no evidence of pulmonary embolism. There is a 1.1 cm AP window lymph node. There is a 1 cm right subcarinal lymph node as these lymph nodes are likely reactive. There is an air-fluid level over the upper esophagus with fluid throughout  the mid to distal esophagus which may be due to reflux versus dysmotility. Remaining mediastinal structures are within normal.  Images through the upper abdomen are within normal. Remainder of the exam is unchanged.  Review of the MIP images confirms the above findings.  IMPRESSION: No evidence of pulmonary embolism.  Diffuse bilateral nodular airspace process most prominent over the mid to upper lungs right worse than left. Minimal reactive mediastinal adenopathy. Findings are likely due to infection. Recommend follow-up CT 4 weeks.  Moderate fluid within the esophagus with air-fluid level over the upper esophagus likely due to reflux.   Electronically Signed   By: Marin Olp M.D.   On: 05/15/2015 16:42   Dg Chest Port 1 View  05/15/2015   CLINICAL DATA:  Acute respiratory failure.  EXAM: PORTABLE CHEST - 1 VIEW  COMPARISON:  CT and radiographs 05/15/2015  FINDINGS: Endotracheal tube in good position 4.6 cm from carina. Bilateral upper lobe nodular airspace disease again demonstrated without improvement. No pneumothorax. No pleural fluid  IMPRESSION: 1. Endotracheal tube in good position. 2. Bilateral nodular airspace disease.   Electronically Signed   By: Suzy Bouchard M.D.   On: 05/15/2015 17:02   Dg Chest Portable 1 View  05/15/2015   CLINICAL DATA:  Shortness of breath and hemoptysis for 1 day. Recent drug overdose.  EXAM: PORTABLE CHEST - 1 VIEW  COMPARISON:  12/05/2014  FINDINGS: New asymmetric airspace disease is seen in both lungs, with greatest involvement in the right lung base. This may be due to hemorrhage, asymmetric edema, or infection. No evidence of pneumothorax or pleural effusion. Heart size and mediastinal contours are within normal limits.  IMPRESSION: New asymmetric airspace disease, with differential diagnosis including hemorrhage, asymmetric edema, and infection.   Electronically Signed   By: Earle Gell M.D.   On: 05/15/2015 13:25     EKG Interpretation   Date/Time:   Saturday May 15 2015 12:52:28 EDT Ventricular Rate:  109 PR Interval:  150 QRS Duration: 88 QT Interval:  327 QTC Calculation: 440 R Axis:   66 Text Interpretation:  Sinus tachycardia No  old tracing to compare  Confirmed by [facility]  MD, [place] [phone]) on 05/15/2015 1:22:09 PM      CRITICAL CARE Performed by: Margarita Mail   Total critical care time: 50  Critical care time was exclusive of separately billable procedures and treating other patients.  Critical care was necessary to treat or prevent imminent or life-threatening deterioration.  Critical care was time spent personally by me on the following activities: development of treatment plan with patient and/or surrogate as well as nursing, discussions with consultants, evaluation of patient's response to treatment, examination of patient, obtaining history from patient or surrogate, ordering and performing treatments and interventions, ordering and review of laboratory studies, ordering and review of radiographic studies, pulse oximetry and re-evaluation of patient's condition.  MDM   Final diagnoses:  Respiratory distress    12:50PM BP 95/49 mmHg  Pulse 110  Temp(Src) 101.9 F (38.8 C) (Core (Comment))  Resp 20  Ht 6' (1.829 m)  Wt 179 lb 14.3 oz (81.6 kg)  BMI 24.39 kg/m2  SpO2 100%   Patient seen in shared visit with Dr. Canary Brim. He is alert and oriented but in respiratory distress.  DDX includes, flash pulmonary edema, aspiration pnuemonia,  pulmonary bleeding? Sepsis? Bleeding from esophagitis and vomiting  Patient appears to be anemic and i have begun protonix as a precaution given his history. I am also checking his HGB AND STOOL 7:50 PM Patient placed on BIPAP,. He is currently receiving lasix for suspected pulmonary edema  His breathing has improved, however critical care has arrived whd will intubate the patient. Patient received on liter PTA ABG shows acute resp acidosis. His glucose is elevated  which is likely acute phase reaction BNP is in process. I have ordered CT angio to r/o PE. No Leukocytosis.   Margarita Mail, PA-C 05/15/15 [place], MD 05/17/15 [phone]

## 2015-05-15 NOTE — Progress Notes (Signed)
eLink Physician-Brief Progress Note Patient Name: Eric Hood DOB: 1981-01-20 MRN: [id number]   Date of Service  05/15/2015  HPI/Events of Note  Hypotension. BP = 86/39.  eICU Interventions  Will bolus with 0.9 NaCl 1 liter IV over 1 hour now.      Intervention Category Major Interventions: Hypotension - evaluation and management  Sommer,Steven Eugene 05/15/2015, 7:08 PM

## 2015-05-15 NOTE — Progress Notes (Signed)
eLink Physician-Brief Progress Note Patient Name: Eric Hood DOB: 10/07/1981 MRN: [id number]   Date of Service  05/15/2015  HPI/Events of Note  Agitation.  eICU Interventions  Will increase the ceiling on the Propofol IV infusion to 80 mcg/kg/min.     Intervention Category Minor Interventions: Agitation / anxiety - evaluation and management  Sommer,Steven Eugene 05/15/2015, 3:32 PM

## 2015-05-15 NOTE — Progress Notes (Signed)
eLink Physician-Brief Progress Note Patient Name: Eric Hood DOB: 09/25/1981 MRN: [id number]   Date of Service  05/15/2015  HPI/Events of Note  Hypotension. SBP = 80's.  eICU Interventions  Will bolus with 0.9 NaCl 1 liter IV over 1 hour now.      Intervention Category Intermediate Interventions: Hypotension - evaluation and management  Marguerette Sheller Eugene 05/15/2015, 6:22 PM

## 2015-05-15 NOTE — Procedures (Addendum)
Intubation Procedure Note Eric Hood [id number] 22-Feb-1981  Procedure: Intubation Indications: Airway protection and maintenance  Procedure Details Consent: Risks of procedure as well as the alternatives and risks of each were explained to the (patient/caregiver).  Consent for procedure obtained. Time Out: Verified patient identification, verified procedure, site/side was marked, verified correct patient position, special equipment/implants available, medications/allergies/relevent history reviewed, required imaging and test results available.  Performed  MAC and 4 Grade 1 view; 7.5 ETT passed through cords under direct visualization Medications:  Fentanyl 100 mcg Etomidate 20 mg Versed 2 mg NMB Roc 100 mg   Evaluation Hemodynamic Status: BP stable throughout; O2 sats: stable throughout Patient's Current Condition: stable Complications: No apparent complications Patient did tolerate procedure well. Chest X-ray ordered to verify placement.  CXR: pending.   Roselie Awkward, MD [facility] Pager: [phone] Cell: [phone] After 3pm or if no response, call [phone]

## 2015-05-15 NOTE — Progress Notes (Signed)
Pt's SBP remains in low 80's after NS 1L bolus administered. Dr. Oletta Darter made aware, new orders given and completed. Will continue to monitor.  Babs Bertin RN

## 2015-05-15 NOTE — Progress Notes (Signed)
[facility] Progress Note Patient Name: Eric Hood DOB: 1981/04/19 MRN: [id number]   Date of Service  05/15/2015  HPI/Events of Note  Multiple issues: 1. Vomiting and 2. Blood in ETT.  Blood production has now stopped. Platelets and INR normal on admission.  eICU Interventions  Will order: 1. Zofran 4 mg IV Q 6 hours PRN N/V.     Intervention Category Intermediate Interventions: Hypotension - evaluation and management  [name] 05/15/2015, 8:19 PM

## 2015-05-15 NOTE — Progress Notes (Signed)
[facility] Progress Note Patient Name: Eric Hood DOB: July 17, 1981 MRN: [id number]   Date of Service  05/15/2015  HPI/Events of Note  Multiple issues: 1. Fever to 103.3 F. 2. Lactic Acid = 3.9 and 3. BP decreased on Propofol IV infusion. Patient is already on Unasyn and has been pan cultured.  eICU Interventions  Will order: 1. Will bolus with 0.9 NaCl 1 liter IV over 1 hour.  2. Repeat Lactic Acid at 9 PM.  3. Tylenol Suppository 650 mg PRN Q 6 hours PRN.     Intervention Category Major Interventions: Hypotension - evaluation and management Intermediate Interventions: Infection - evaluation and management  Chrishaun Sasso Eugene 05/15/2015, 5:23 PM

## 2015-05-15 NOTE — Progress Notes (Signed)
CRITICAL VALUE ALERT  Critical value received:  Latic acid 3.9  Date of notification:  05/05/15  Time of notification:  9758  Critical value read back:Yes.    Nurse who received alert:  Allegra Grana, RN  MD notified (1st page):  Dr Oletta Darter  Time of first page:  1720  MD notified (2nd page):  Time of second page:  Responding MD:  Dr Oletta Darter  Time MD responded:  [phone]

## 2015-05-15 NOTE — Progress Notes (Signed)
Dr. Oletta Darter notified of a SBP remaining in 80's after 2L NS bolus given. New orders given, will complete and continue to follow. Amy RN also made aware of plan.  Babs Bertin RN

## 2015-05-15 NOTE — ED Notes (Signed)
Bipap being placed by respiratory, MD at bedside

## 2015-05-15 NOTE — ED Notes (Signed)
NOTIFIED A. HARRIS ,PAC IN PERSON FOR PATIENTS LAB RESULTS OF CG4+LACTIC ACID , I-STAT TROPONIN @13 :40PM .

## 2015-05-15 NOTE — Progress Notes (Signed)
Pt's father called and number added to chart, message left on voicemail for him to return my call.

## 2015-05-15 NOTE — ED Notes (Signed)
Pt transported to CT with RN, respiratory, and EMT.  To 2M11 after CT.  Pt stable at time of transport.

## 2015-05-15 NOTE — Progress Notes (Signed)
CRITICAL VALUE ALERT  Critical value received:  Lactic Acid 5.5  Date of notification:  05/15/15  Time of notification:  2230  Critical value read back:Yes.    Nurse who received alert:  Elsie Saas RN  MD notified (1st page):  Dr Oletta Darter  Time of first page:  2231  MD notified (2nd page):  Time of second page:  Responding MD:  Dr Oletta Darter  Time MD responded:  2231

## 2015-05-15 NOTE — Progress Notes (Signed)
eLink Physician-Brief Progress Note Patient Name: Alexandros Ewan DOB: December 02, 1980 MRN: [id number]   Date of Service  05/15/2015  HPI/Events of Note  Remains hypotensive after 4 L of crystalloid. Will require Norepinephrine IV infusion and CVP monitoring.   eICU Interventions  Notified on call team to place central venous catheter. Will order: 1. Norepinephrine IV infusion. 2. Monitor CVP post central venous catheter placement.      Intervention Category Major Interventions: Acid-Base disturbance - evaluation and management;Shock - evaluation and management;Hypotension - evaluation and management;Sepsis - evaluation and management  Erick Oxendine Cornelia Copa 05/15/2015, 10:31 PM

## 2015-05-15 NOTE — Procedures (Signed)
Central Venous Catheter Insertion Procedure Note Ethanael Veith [id number] 1981/06/17  Procedure: Insertion of Central Venous Catheter Indications: Assessment of intravascular volume, Drug and/or fluid administration and Frequent blood sampling  Procedure Details Consent: Unable to obtain consent because of emergent medical necessity. Time Out: Verified patient identification, verified procedure, site/side was marked, verified correct patient position, special equipment/implants available, medications/allergies/relevent history reviewed, required imaging and test results available.  Performed  Maximum sterile technique was used including antiseptics, cap, gloves, gown, hand hygiene, mask and sheet. Skin prep: Chlorhexidine; local anesthetic administered A antimicrobial bonded/coated triple lumen catheter was placed in the left subclavian vein using the Seldinger technique.  Evaluation Blood flow good Complications: No apparent complications Patient did tolerate procedure well. Chest X-ray ordered to verify placement.  CXR: pending.  Raylinn Kosar 05/15/2015, 11:29 PM

## 2015-05-15 NOTE — ED Notes (Signed)
Portable xray at bedside.

## 2015-05-15 NOTE — ED Notes (Addendum)
Per EMS, Patient is coming from home. Patient has a HX of Heroin usage. Patient snorted "about an inch" of heroin, less than his normal. Patient started to vomit up blood and have SOB. Patient complains of heart racing and SOB. When Patient arrived to the ED, Saturations dropped in the 30s. Patient has active wheezing. Alert and oriented. Vitals per EMS: Initial BP 70/30, HR 106, Sat 99% on NRB, 80s on [place]. Last BP 94/50, 308 CBG. Patient has HX anemia with blood transfusion in February and GERD.

## 2015-05-15 NOTE — ED Notes (Signed)
MD Randa Lynn PA at the bedside.

## 2015-05-15 NOTE — H&P (Signed)
PULMONARY / CRITICAL CARE MEDICINE   Name: Eric Hood MRN: [id number] DOB: [birth year]    ADMISSION DATE:  05/15/2015   REFERRING MD :  edp  CHIEF COMPLAINT:  Passed out post heroin abuse   INITIAL PRESENTATION: Hypercarbic/hypoxic  STUDIES:    SIGNIFICANT EVENTS: 7/30 placed on NIMVS   HISTORY OF PRESENT ILLNESS:  34 yo WM with hx of chronic back pain, heroin use, never smoker, esophogeal stricture with esophogeal bleeding , who presents to [facility] ED post heroin overdose.  He snorted heroin and woke up in the bathtub with vomitus and blood in tub. Despite interventions in ED he has  Progressed to NIMVS and Cxr shows RLL infiltrate. PCCM asked to evaluate and he may need intubation for presumed aspiration and early ALI.  PAST MEDICAL HISTORY :   has a past medical history of Back pain; Reflux; Esophageal stricture (03/2014); Hiatal hernia; Ulcerative esophagitis (03/2014); Anxiety; GERD (gastroesophageal reflux disease); and Anemia (10/23/2014).  has past surgical history that includes Hand surgery (Right); Esophageal dilation; and Esophagogastroduodenoscopy (egd) with propofol (N/A, 01/18/2015). Prior to Admission medications   Medication Sig Start Date End Date Taking? Authorizing Provider  baclofen (LIORESAL) 10 MG tablet Take 1 tablet (10 mg total) by mouth 3 (three) times daily. 05/12/15   Lyndal Pulley, DO  pantoprazole (PROTONIX) 40 MG tablet TAKE 1 TABLET (40 MG TOTAL) BY MOUTH 2 (TWO) TIMES DAILY. 02/10/15   Lyndal Pulley, DO  ranitidine (ZANTAC) 300 MG tablet Take 1 tablet (300 mg total) by mouth at bedtime. 02/23/15   Ladene Artist, MD   No Known Allergies  FAMILY HISTORY:  indicated that his mother is alive. He indicated that his father is alive.  SOCIAL HISTORY:  reports that he has never smoked. He has never used smokeless tobacco. He reports that he does not drink alcohol or use illicit drugs.  REVIEW OF SYSTEMS:  10 point review of system taken, please see HPI  for positives and negatives.   SUBJECTIVE:  Increased WOB , now on NIMVS  VITAL SIGNS: Temp:  [97.9 F (36.6 C)] 97.9 F (36.6 C) (07/30 1320) Pulse Rate:  [110-137] 137 (07/30 1330) Resp:  [41-48] 42 (07/30 1330) BP: (124-140)/(61-117) 140/117 mmHg (07/30 1328) SpO2:  [89 %-98 %] 98 % (07/30 1330) HEMODYNAMICS:   VENTILATOR SETTINGS:   INTAKE / OUTPUT: No intake or output data in the 24 hours ending 05/15/15 1341  PHYSICAL EXAMINATION: General:  WNWDWM in resp distress Neuro:  Anxious, follows commands., maex4. PERL 46mm HEENT:  On NIMVS, no jvd/lan Cardiovascular:  hsr rrr Lungs:  Decreased bs, rhonchi and exp wheezes Abdomen:  Soft +bs Musculoskeletal:  intact Skin:  Warm and dry  LABS:  CBC  Recent Labs Lab 05/15/15 1308  WBC 7.9  HGB 11.7*  HCT 39.2  PLT 247   Coag's No results for input(s): APTT, INR in the last 168 hours. BMET No results for input(s): NA, K, CL, CO2, BUN, CREATININE, GLUCOSE in the last 168 hours. Electrolytes No results for input(s): CALCIUM, MG, PHOS in the last 168 hours. Sepsis Markers No results for input(s): LATICACIDVEN, PROCALCITON, O2SATVEN in the last 168 hours. ABG  Recent Labs Lab 05/15/15 1320  PHART 7.212*  PCO2ART 64.9*  PO2ART 55.0*   Liver Enzymes No results for input(s): AST, ALT, ALKPHOS, BILITOT, ALBUMIN in the last 168 hours. Cardiac Enzymes No results for input(s): TROPONINI, PROBNP in the last 168 hours. Glucose No results for input(s): GLUCAP in the last  168 hours.  Imaging Dg Chest Portable 1 View  05/15/2015   CLINICAL DATA:  Shortness of breath and hemoptysis for 1 day. Recent drug overdose.  EXAM: PORTABLE CHEST - 1 VIEW  COMPARISON:  12/05/2014  FINDINGS: New asymmetric airspace disease is seen in both lungs, with greatest involvement in the right lung base. This may be due to hemorrhage, asymmetric edema, or infection. No evidence of pneumothorax or pleural effusion. Heart size and mediastinal  contours are within normal limits.  IMPRESSION: New asymmetric airspace disease, with differential diagnosis including hemorrhage, asymmetric edema, and infection.   Electronically Signed   By: Earle Gell M.D.   On: 05/15/2015 13:25     ASSESSMENT / PLAN:  PULMONARY OETT A: Aspiration Pneumonia ALI Hypoxia/hypercarbia   P:   Intubate now, full vent support O2 as needed Abx May need FOB   CARDIOVASCULAR CVL A:   No acute issue P:    RENAL A:  No acute issue P:     GASTROINTESTINAL A:  Hx of esophagitis GERD GIB in past  P:   PPI bid  HEMATOLOGIC A:   No acute issue but with possible hemoptysis vs GIB P:  CBC monitoring  No anticoagulants  INFECTIOUS A:   Presumed aspiration  P:   BCx2 7/30>> UC 7/30 Sputum7/30 >>  Abx:  7/30 unasyn>>  ENDOCRINE A:   No acute issue  P:     NEUROLOGIC A:    Chronic pain Heroin overdose P:   RASS goal: 1 Rass of 0 if intubated Propofol gtt  FAMILY  - Updates:   - Inter-disciplinary family meet or Palliative Care meeting due by:   day 7     TODAY'S SUMMARY:  34 yo WM with hx of chronic back pain, heroin use, never smoker, esophogeal stricture with esophogeal bleeding , who presents to [facility] ED post heroin overdose.  He snorted heroin and woke up in the bathtub with vomitus and blood in tub. Despite interventions in ED he has  Progressed to NIMVS and Cxr shows RLL infiltrate. PCCM asked to evaluate and he may need intubation for presumed aspiration and early ALI.  Richardson Landry Minor ACNP [facility] PCCM Pager [phone] till 3 pm If no answer page [phone] 05/15/2015, 1:48 PM    Attending:  I have seen and examined the patient with nurse practitioner/resident and agree with the note above.   Mr. Meter was still tachypnic on BIPAP this afternoon when we were consulted.    Exam: slurred speech, tachypnic, crackles R base, no edema legs  CXR : RLL Pneumonia  Acute respiratory failure with hypoxemia  due to aspiration pneumonia in setting of narcotic abuse, consider direct inhalational injury> full vent support, unasyn, f/u culture Esophageal strictures/history of esophagitis Acute encephalopathy due to narcotic overdose  My cc time 45 minutes  Roselie Awkward, MD [place] PCCM Pager: [phone] Cell: [phone] After 3pm or if no response, call [phone]

## 2015-05-15 NOTE — ED Notes (Signed)
Pharmacy notified for medication

## 2015-05-15 NOTE — Code Documentation (Addendum)
MD at bedside preparing for intubation

## 2015-05-15 NOTE — ED Notes (Signed)
Critical care at bedside  

## 2015-05-16 ENCOUNTER — Encounter (HOSPITAL_COMMUNITY): Payer: Self-pay

## 2015-05-16 DIAGNOSIS — T40601D Poisoning by unspecified narcotics, accidental (unintentional), subsequent encounter: Secondary | ICD-10-CM

## 2015-05-16 DIAGNOSIS — K222 Esophageal obstruction: Secondary | ICD-10-CM

## 2015-05-16 LAB — BLOOD GAS, ARTERIAL
Acid-Base Excess: 1.3 mmol/L (ref 0.0–2.0)
Bicarbonate: 25.6 mEq/L — ABNORMAL HIGH (ref 20.0–24.0)
DRAWN BY: 437071
FIO2: 0.4
LHR: 20 {breaths}/min
MECHVT: 600 mL
O2 SAT: 98.3 %
PEEP: 5 cmH2O
PO2 ART: 105 mmHg — AB (ref 80.0–100.0)
Patient temperature: 100.8
TCO2: 26.9 mmol/L (ref 0–100)
pCO2 arterial: 45.4 mmHg — ABNORMAL HIGH (ref 35.0–45.0)
pH, Arterial: 7.377 (ref 7.350–7.450)

## 2015-05-16 LAB — CBC
HEMATOCRIT: 28.7 % — AB (ref 39.0–52.0)
Hemoglobin: 8.9 g/dL — ABNORMAL LOW (ref 13.0–17.0)
MCH: 24.1 pg — AB (ref 26.0–34.0)
MCHC: 31 g/dL (ref 30.0–36.0)
MCV: 77.6 fL — AB (ref 78.0–100.0)
Platelets: 210 10*3/uL (ref 150–400)
RBC: 3.7 MIL/uL — AB (ref 4.22–5.81)
RDW: 16.7 % — ABNORMAL HIGH (ref 11.5–15.5)
WBC: 11.4 10*3/uL — ABNORMAL HIGH (ref 4.0–10.5)

## 2015-05-16 LAB — BASIC METABOLIC PANEL
Anion gap: 9 (ref 5–15)
BUN: 8 mg/dL (ref 6–20)
CO2: 25 mmol/L (ref 22–32)
Calcium: 7.8 mg/dL — ABNORMAL LOW (ref 8.9–10.3)
Chloride: 109 mmol/L (ref 101–111)
Creatinine, Ser: 1.12 mg/dL (ref 0.61–1.24)
GFR calc Af Amer: >60 mL/min (ref >60)
GFR calc non Af Amer: >60 mL/min (ref >60)
Glucose, Bld: 129 mg/dL — ABNORMAL HIGH (ref 65–99)
Potassium: 3.9 mmol/L (ref 3.5–5.1)
Sodium: 143 mmol/L (ref 135–145)

## 2015-05-16 LAB — GLUCOSE, CAPILLARY
Glucose-Capillary: 112 mg/dL — ABNORMAL HIGH (ref 65–99)
Glucose-Capillary: 93 mg/dL (ref 65–99)
Glucose-Capillary: 93 mg/dL (ref 65–99)

## 2015-05-16 LAB — POCT I-STAT 3, ART BLOOD GAS (G3+)
Acid-Base Excess: 1 mmol/L (ref 0.0–2.0)
Acid-Base Excess: 1 mmol/L (ref 0.0–2.0)
Bicarbonate: 26.2 mEq/L — ABNORMAL HIGH (ref 20.0–24.0)
Bicarbonate: 26.1 mEq/L — ABNORMAL HIGH (ref 20.0–24.0)
O2 Saturation: 96 %
O2 Saturation: 93 %
PCO2 ART: 40.7 mmHg (ref 35.0–45.0)
PCO2 ART: 40.6 mmHg (ref 35.0–45.0)
PH ART: 7.416 (ref 7.350–7.450)
PO2 ART: 80 mmHg (ref 80.0–100.0)
PO2 ART: 65 mmHg — AB (ref 80.0–100.0)
Patient temperature: 98
TCO2: 27 mmol/L (ref 0–100)
TCO2: 27 mmol/L (ref 0–100)
pH, Arterial: 7.415 (ref 7.350–7.450)

## 2015-05-16 LAB — TROPONIN I: TROPONIN I: 0.05 ng/mL — AB (ref <0.031)

## 2015-05-16 NOTE — Progress Notes (Signed)
PULMONARY / CRITICAL CARE MEDICINE   Name: Eric Hood MRN: [id number] DOB: [birth year]    ADMISSION DATE:  05/15/2015   REFERRING MD :  edp  CHIEF COMPLAINT:  Passed out post heroin abuse   INITIAL PRESENTATION: Hypercarbic/hypoxic  STUDIES:    SIGNIFICANT EVENTS: 7/30 placed on NIMVS   HISTORY OF PRESENT ILLNESS:  34 yo WM with hx of chronic back pain, heroin use, never smoker, esophogeal stricture with esophogeal bleeding , who presents to [facility] ED post heroin overdose.  He snorted heroin and woke up in the bathtub with vomitus and blood in tub. Despite interventions in ED he has  Progressed to NIMVS and Cxr shows RLL infiltrate. PCCM asked to evaluate and he may need intubation for presumed aspiration and early ALI.    SUBJECTIVE:  Sedated on vent  VITAL SIGNS: Temp:  [97.9 F (36.6 C)-103.6 F (39.8 C)] 100.3 F (37.9 C) (07/31 0800) Pulse Rate:  [28-175] 95 (07/31 0942) Resp:  [14-48] 20 (07/31 0942) BP: (77-155)/(30-119) 102/59 mmHg (07/31 0942) SpO2:  [76 %-100 %] 100 % (07/31 0942) FiO2 (%):  [40 %-100 %] 40 % (07/31 0942) Weight:  [179 lb 14.3 oz (81.6 kg)-189 lb 9.5 oz (86 kg)] 189 lb 9.5 oz (86 kg) (07/31 0407) HEMODYNAMICS: CVP:  [4 mmHg-10 mmHg] 10 mmHg VENTILATOR SETTINGS: Vent Mode:  [-] PRVC FiO2 (%):  [40 %-100 %] 40 % Set Rate:  [20 bmp] 20 bmp Vt Set:  [600 mL] 600 mL PEEP:  [5 cmH20-10 cmH20] 5 cmH20 Pressure Support:  [10 cmH20] 10 cmH20 Plateau Pressure:  [15 cmH20-25 cmH20] 18 cmH20 INTAKE / OUTPUT:  Intake/Output Summary (Last 24 hours) at 05/16/15 1051 Last data filed at 05/16/15 0800  Gross per 24 hour  Intake 3513.25 ml  Output   3775 ml  Net -261.75 ml    PHYSICAL EXAMINATION: General:  WNWDWM on vent Neuro:  Sedated., maex4. PERL 2mm HEENT: OTT no jvd/lan Cardiovascular:  hsr rrr Lungs:  Decreased bs, + rhonchi Abdomen:  Soft +bs, NO TF/OGT due to ESOPHOGEAL stricture Musculoskeletal:  intact Skin:  Warm and  dry  LABS:  CBC  Recent Labs Lab 05/15/15 1308 05/15/15 1455 05/16/15 0207  WBC 7.9 10.7* 11.4*  HGB 11.7* 12.2* 8.9*  HCT 39.2 40.3 28.7*  PLT 247 342 210   Coag's  Recent Labs Lab 05/15/15 1455  APTT 26  INR 1.06   BMET  Recent Labs Lab 05/15/15 1308 05/15/15 1455 05/16/15 0207  NA 139 140 143  K 5.1 3.8 3.9  CL 106 103 109  CO2 26 27 25   BUN 10 8 8   CREATININE 1.18 0.99 1.12  GLUCOSE 151* 136* 129*   Electrolytes  Recent Labs Lab 05/15/15 1308 05/15/15 1455 05/16/15 0207  CALCIUM 8.4* 8.6* 7.8*  MG  --  1.5*  --   PHOS  --  4.1  --    Sepsis Markers  Recent Labs Lab 05/15/15 1329 05/15/15 1455 05/15/15 2039  LATICACIDVEN 2.55* 3.9* 5.5*  PROCALCITON  --  <0.10  --    ABG  Recent Labs Lab 05/15/15 1320 05/15/15 1735 05/16/15 0500  PHART 7.212* 7.350 7.377  PCO2ART 64.9* 50.1* 45.4*  PO2ART 55.0* 447.0* 105*   Liver Enzymes  Recent Labs Lab 05/15/15 1308 05/15/15 1455  AST 27 27  ALT 18 22  ALKPHOS 68 71  BILITOT 0.3 0.5  ALBUMIN 3.3* 3.6   Cardiac Enzymes  Recent Labs Lab 05/15/15 1455 05/15/15 1943 05/16/15 0207  TROPONINI 0.06* 0.11* 0.05*   Glucose  Recent Labs Lab 05/15/15 1643 05/15/15 2001 05/16/15 0004 05/16/15 0354 05/16/15 0816  GLUCAP 111* 96 112* 93 93    Imaging Ct Angio Chest Pe W/cm &/or Wo Cm  05/15/2015   CLINICAL DATA:  Respiratory distress after drug use.  Hypoxia.  EXAM: CT ANGIOGRAPHY CHEST WITH CONTRAST  TECHNIQUE: Multidetector CT imaging of the chest was performed using the standard protocol during bolus administration of intravenous contrast. Multiplanar CT image reconstructions and MIPs were obtained to evaluate the vascular anatomy.  CONTRAST:  86mL OMNIPAQUE IOHEXOL 300 MG/ML  SOLN  COMPARISON:  10/23/2014  FINDINGS: Endotracheal tube in adequate position. Lungs are adequately inflated and demonstrate a moderate bilateral nodular airspace process most prominent over the mid to upper  lungs right worse than left. Mild right posterior basilar atelectasis. No effusion. Airways are within normal.  Heart is normal in size. There is no evidence of pulmonary embolism. There is a 1.1 cm AP window lymph node. There is a 1 cm right subcarinal lymph node as these lymph nodes are likely reactive. There is an air-fluid level over the upper esophagus with fluid throughout the mid to distal esophagus which may be due to reflux versus dysmotility. Remaining mediastinal structures are within normal.  Images through the upper abdomen are within normal. Remainder of the exam is unchanged.  Review of the MIP images confirms the above findings.  IMPRESSION: No evidence of pulmonary embolism.  Diffuse bilateral nodular airspace process most prominent over the mid to upper lungs right worse than left. Minimal reactive mediastinal adenopathy. Findings are likely due to infection. Recommend follow-up CT 4 weeks.  Moderate fluid within the esophagus with air-fluid level over the upper esophagus likely due to reflux.   Electronically Signed   By: Marin Olp M.D.   On: 05/15/2015 16:42   Dg Chest Port 1 View  05/15/2015   CLINICAL DATA:  Central line placement.  Acute lung injury.  EXAM: PORTABLE CHEST - 1 VIEW  COMPARISON:  Chest radiograph earlier this day at 1651 hour. Chest CT earlier this day.  FINDINGS: Tip of the left subclavian central venous catheter in the mid SVC. No pneumothorax. Endotracheal tube at the thoracic inlet, 5.4 cm from the current. Patient is rotated to the right. Progressive patchy and nodular airspace opacity in the right lower lobe with questionable developing pleural effusion. Diffuse ground-glass nodular opacities in an upper lobe predominant distribution again seen. Cardiomediastinal contours are normal.  IMPRESSION: 1. Tip of the left central line in the mid SVC.  No pneumothorax. 2. Diffuse bilateral multifocal airspace opacity, with progressive opacity at the right lung base.  Question developing right pleural effusion.   Electronically Signed   By: Jeb Levering M.D.   On: 05/15/2015 23:58   Dg Chest Port 1 View  05/15/2015   CLINICAL DATA:  Acute respiratory failure.  EXAM: PORTABLE CHEST - 1 VIEW  COMPARISON:  CT and radiographs 05/15/2015  FINDINGS: Endotracheal tube in good position 4.6 cm from carina. Bilateral upper lobe nodular airspace disease again demonstrated without improvement. No pneumothorax. No pleural fluid  IMPRESSION: 1. Endotracheal tube in good position. 2. Bilateral nodular airspace disease.   Electronically Signed   By: Suzy Bouchard M.D.   On: 05/15/2015 17:02   Dg Chest Portable 1 View  05/15/2015   CLINICAL DATA:  Shortness of breath and hemoptysis for 1 day. Recent drug overdose.  EXAM: PORTABLE CHEST - 1 VIEW  COMPARISON:  12/05/2014  FINDINGS: New asymmetric airspace disease is seen in both lungs, with greatest involvement in the right lung base. This may be due to hemorrhage, asymmetric edema, or infection. No evidence of pneumothorax or pleural effusion. Heart size and mediastinal contours are within normal limits.  IMPRESSION: New asymmetric airspace disease, with differential diagnosis including hemorrhage, asymmetric edema, and infection.   Electronically Signed   By: Earle Gell M.D.   On: 05/15/2015 13:25     ASSESSMENT / PLAN:  PULMONARY OETT 7/30>> A: Aspiration Pneumonia ALI/BASDZ/ARDS Hypoxia/hypercarbia   P:   Intubate now, full vent support O2 as needed Abx May need FOB Change to ARDS protocol 7/31   CARDIOVASCULAR CVL 7/31 lt [place] cvl>> A:  Hypotension P:  IVF cvp pushed from 4 to 10 with fluids and pressors off. May need to change diprivan drip if hypotension returns. Prn pressors  RENAL A:  No acute issue P:     GASTROINTESTINAL A:  Hx of esophagitis GERD GIB in past  P:   PPI drip Possible GI eval, needs feeding tube if possible.  HEMATOLOGIC A:   No acute issue but with possible  hemoptysis vs GIB P:  CBC monitoring  No anticoagulants  INFECTIOUS A:   Presumed aspiration  P:   BCx2 7/30>> UC 7/30>> Sputum7/30 >>  Abx:  7/30 unasyn>>  ENDOCRINE CBG (last 3)   Recent Labs  05/16/15 0004 05/16/15 0354 05/16/15 0816  GLUCAP 112* 93 93     A:   No acute issue  P:     NEUROLOGIC A:    Chronic pain Heroin overdose P:   RASS goal: -1 Rass of -1 Propofol gtt  FAMILY  - Updates:   - Inter-disciplinary family meet or Palliative Care meeting due by:   day 7     TODAY'S SUMMARY:  Intubated 7/30. 7/31 c x r worse with basdz.   Richardson Landry Tosha Belgarde ACNP [facility] PCCM Pager [phone] till 3 pm If no answer page [phone] 05/16/2015, 10:51 AM

## 2015-05-17 ENCOUNTER — Inpatient Hospital Stay (HOSPITAL_COMMUNITY): Payer: Self-pay

## 2015-05-17 DIAGNOSIS — J9601 Acute respiratory failure with hypoxia: Secondary | ICD-10-CM | POA: Insufficient documentation

## 2015-05-17 DIAGNOSIS — S27309A Unspecified injury of lung, unspecified, initial encounter: Secondary | ICD-10-CM

## 2015-05-17 DIAGNOSIS — R0603 Acute respiratory distress: Secondary | ICD-10-CM | POA: Insufficient documentation

## 2015-05-17 LAB — CBC
HCT: 26.8 % — ABNORMAL LOW (ref 39.0–52.0)
HCT: 26.5 % — ABNORMAL LOW (ref 39.0–52.0)
HEMOGLOBIN: 8.2 g/dL — AB (ref 13.0–17.0)
Hemoglobin: 8.1 g/dL — ABNORMAL LOW (ref 13.0–17.0)
MCH: 23.8 pg — ABNORMAL LOW (ref 26.0–34.0)
MCH: 23.8 pg — AB (ref 26.0–34.0)
MCHC: 30.6 g/dL (ref 30.0–36.0)
MCHC: 30.6 g/dL (ref 30.0–36.0)
MCV: 77.9 fL — ABNORMAL LOW (ref 78.0–100.0)
MCV: 77.9 fL — AB (ref 78.0–100.0)
Platelets: 186 10*3/uL (ref 150–400)
Platelets: 204 10*3/uL (ref 150–400)
RBC: 3.44 MIL/uL — ABNORMAL LOW (ref 4.22–5.81)
RBC: 3.4 MIL/uL — AB (ref 4.22–5.81)
RDW: 17.3 % — ABNORMAL HIGH (ref 11.5–15.5)
RDW: 17.1 % — ABNORMAL HIGH (ref 11.5–15.5)
WBC: 7.3 10*3/uL (ref 4.0–10.5)
WBC: 7.6 10*3/uL (ref 4.0–10.5)

## 2015-05-17 LAB — BLOOD GAS, ARTERIAL
Acid-Base Excess: 3.2 mmol/L — ABNORMAL HIGH (ref 0.0–2.0)
Bicarbonate: 27.5 mEq/L — ABNORMAL HIGH (ref 20.0–24.0)
DRAWN BY: 437071
FIO2: 0.4
MECHVT: 460 mL
O2 SAT: 96.6 %
PATIENT TEMPERATURE: 100.8
PEEP/CPAP: 5 cmH2O
RATE: 24 resp/min
TCO2: 28.9 mmol/L (ref 0–100)
pCO2 arterial: 46.7 mmHg — ABNORMAL HIGH (ref 35.0–45.0)
pH, Arterial: 7.395 (ref 7.350–7.450)
pO2, Arterial: 94.5 mmHg (ref 80.0–100.0)

## 2015-05-17 LAB — BASIC METABOLIC PANEL
Anion gap: 4 — ABNORMAL LOW (ref 5–15)
BUN: 7 mg/dL (ref 6–20)
CO2: 28 mmol/L (ref 22–32)
Calcium: 8.3 mg/dL — ABNORMAL LOW (ref 8.9–10.3)
Chloride: 111 mmol/L (ref 101–111)
Creatinine, Ser: 0.99 mg/dL (ref 0.61–1.24)
GFR calc Af Amer: >60 mL/min (ref >60)
GFR calc non Af Amer: >60 mL/min (ref >60)
Glucose, Bld: 129 mg/dL — ABNORMAL HIGH (ref 65–99)
Potassium: 3.3 mmol/L — ABNORMAL LOW (ref 3.5–5.1)
Sodium: 143 mmol/L (ref 135–145)

## 2015-05-17 LAB — LACTATE DEHYDROGENASE: LDH: 154 U/L (ref 98–192)

## 2015-05-17 LAB — RETICULOCYTES
RBC.: 3.4 MIL/uL — ABNORMAL LOW (ref 4.22–5.81)
Retic Count, Absolute: 23.8 10*3/uL (ref 19.0–186.0)
Retic Ct Pct: 0.7 % (ref 0.4–3.1)

## 2015-05-17 LAB — LEGIONELLA ANTIGEN, URINE

## 2015-05-17 LAB — URINE CULTURE: Culture: NO GROWTH

## 2015-05-17 LAB — MAGNESIUM: Magnesium: 1.7 mg/dL (ref 1.7–2.4)

## 2015-05-17 LAB — SODIUM, URINE, RANDOM: Sodium, Ur: 161 mmol/L

## 2015-05-17 LAB — PHOSPHORUS: Phosphorus: 2.4 mg/dL — ABNORMAL LOW (ref 2.5–4.6)

## 2015-05-17 MED ORDER — MAGNESIUM SULFATE 2 GM/50ML IV SOLN
2.0000 g | Freq: Once | INTRAVENOUS | Status: AC
  Administered 2015-05-17: 2 g via INTRAVENOUS
  Filled 2015-05-17: qty 50

## 2015-05-17 MED ORDER — SODIUM CHLORIDE 0.45 % IV SOLN
INTRAVENOUS | Status: DC
  Administered 2015-05-17: 75 mL/h via INTRAVENOUS
  Administered 2015-05-18 (×2): via INTRAVENOUS

## 2015-05-17 MED ORDER — POTASSIUM CHLORIDE 10 MEQ/50ML IV SOLN
10.0000 meq | INTRAVENOUS | Status: AC
  Administered 2015-05-17 (×2): 10 meq via INTRAVENOUS
  Filled 2015-05-17 (×2): qty 50

## 2015-05-17 MED ORDER — MAGNESIUM SULFATE 2 GM/50ML IV SOLN
2.0000 g | Freq: Once | INTRAVENOUS | Status: AC
Start: 2015-05-17 — End: 2015-05-17
  Administered 2015-05-17: 2 g via INTRAVENOUS
  Filled 2015-05-17: qty 50

## 2015-05-17 NOTE — Progress Notes (Signed)
[facility] ADULT ICU REPLACEMENT PROTOCOL FOR AM LAB REPLACEMENT ONLY  The patient does apply for the [facility] Adult ICU Electrolyte Replacment Protocol based on the criteria listed below:   1. Is GFR >/= 40 ml/min? Yes.    Patient's GFR today is >60 2. Is urine output >/= 0.5 ml/kg/hr for the last 6 hours? Yes.   Patient's UOP is 0.8 ml/kg/hr 3. Is BUN < 60 mg/dL? Yes.    Patient's BUN today is 7 4. Abnormal electrolyte(s): K+3.3  Mg 1.7 5. Ordered repletion with: protocol 6. If a panic level lab has been reported, has the CCM MD in charge been notified? No..   Physician:  Arlester Marker, [place] 05/17/2015 4:46 AM

## 2015-05-17 NOTE — Progress Notes (Signed)
PULMONARY / CRITICAL CARE MEDICINE   Name: Eric Hood MRN: [id number] DOB: 14-Dec-1980    ADMISSION DATE:  05/15/2015 TODAYS DATE: 05/17/15  REFERRING MD :  edp  CHIEF COMPLAINT:  Passed out post heroin abuse   INITIAL PRESENTATION: Hypercarbic/hypoxic  STUDIES:  7/30 CTA chest >> no evidence of PE   SIGNIFICANT EVENTS: 7/30 intubated, central line placed   LINES/DRAINS: ETT 7/30 >> Central line L [place] 7/30 >>   BRIEF PATIENT DESCRIPTOR: 34 yo WM nonsmoker with hx of chronic back pain, heroin use, and esophogeal stricture with esophogeal bleeding, presented to [facility] ED post heroin overdose on 7/30.  Reportedly he snorted heroin and woke up in the bathtub with vomitus and blood in tub. PCCM was consulted and he was intubated and central line placed. He is now presenting with signs of RLL infiltrate per CXR and anemia. He has remained alert and oriented.   SUBJECTIVE:  Patient awake and mildly agitated, attempted to converse with hands. Indicates he has no pain and is hungry.  VITAL SIGNS: Temp:  [97.9 F (36.6 C)-100.7 F (38.2 C)] 99.7 F (37.6 C) (08/01 0900) Pulse Rate:  [89-105] 103 (08/01 0900) Resp:  [9-26] 24 (08/01 0900) BP: (94-127)/(48-67) 118/56 mmHg (08/01 0900) SpO2:  [96 %-100 %] 100 % (08/01 0900) FiO2 (%):  [40 %] 40 % (08/01 0824) Weight:  [88 kg (194 lb 0.1 oz)] 88 kg (194 lb 0.1 oz) (08/01 0500) HEMODYNAMICS: CVP:  [2 mmHg-11 mmHg] 2 mmHg VENTILATOR SETTINGS: Vent Mode:  [-] PSV;CPAP FiO2 (%):  [40 %] 40 % Set Rate:  [24 bmp] 24 bmp Vt Set:  [460 mL-540 mL] 460 mL PEEP:  [5 cmH20] 5 cmH20 Pressure Support:  [5 cmH20] 5 cmH20 Plateau Pressure:  [11 cmH20-17 cmH20] 15 cmH20 INTAKE / OUTPUT:  Intake/Output Summary (Last 24 hours) at 05/17/15 0953 Last data filed at 05/17/15 0900  Gross per 24 hour  Intake 3515.8 ml  Output   3685 ml  Net -169.2 ml    PHYSICAL EXAMINATION: General:  Pale, mildly diaphoretic awake young man, laying in bed,  intubated Neuro: Awake, alert, follows commands, attempting to communicate with hand motions. Semi-agitated HEENT: OTT in place, blood tinged sputum suctioning Cardiovascular:  Tachycardic in room, no m/r/g Lungs:  +rhonchi bilaterally, no wheezing or rhonchi Abdomen:  Soft +bs, No OG tube placed because hx of esophageal stricture Musculoskeletal:  intact Skin:  Pale, warm  LABS:  CBC  Recent Labs Lab 05/15/15 1455 05/16/15 0207 05/17/15 0315  WBC 10.7* 11.4* 7.3  HGB 12.2* 8.9* 8.2*  HCT 40.3 28.7* 26.8*  PLT 342 210 186   Coag's  Recent Labs Lab 05/15/15 1455  APTT 26  INR 1.06   BMET  Recent Labs Lab 05/15/15 1455 05/16/15 0207 05/17/15 0315  NA 140 143 143  K 3.8 3.9 3.3*  CL 103 109 111  CO2 27 25 28   BUN 8 8 7   CREATININE 0.99 1.12 0.99  GLUCOSE 136* 129* 129*   Electrolytes  Recent Labs Lab 05/15/15 1455 05/16/15 0207 05/17/15 0315  CALCIUM 8.6* 7.8* 8.3*  MG 1.5*  --  1.7  PHOS 4.1  --  2.4*   Sepsis Markers  Recent Labs Lab 05/15/15 1329 05/15/15 1455 05/15/15 2039  LATICACIDVEN 2.55* 3.9* 5.5*  PROCALCITON  --  <0.10  --    ABG  Recent Labs Lab 05/16/15 1215 05/16/15 1254 05/17/15 0415  PHART 7.415 7.416 7.395  PCO2ART 40.7 40.6 46.7*  PO2ART 80.0  65.0* 94.5   Liver Enzymes  Recent Labs Lab 05/15/15 1308 05/15/15 1455  AST 27 27  ALT 18 22  ALKPHOS 68 71  BILITOT 0.3 0.5  ALBUMIN 3.3* 3.6   Cardiac Enzymes  Recent Labs Lab 05/15/15 1455 05/15/15 1943 05/16/15 0207  TROPONINI 0.06* 0.11* 0.05*   Glucose  Recent Labs Lab 05/15/15 1643 05/15/15 2001 05/16/15 0004 05/16/15 0354 05/16/15 0816  GLUCAP 111* 96 112* 93 93    Imaging Dg Chest Port 1 View  05/17/2015   CLINICAL DATA:  Hypoxia  EXAM: PORTABLE CHEST - 1 VIEW  COMPARISON:  May 15, 2015  FINDINGS: Endotracheal tube tip is 4.8 cm above the carina. Central catheter tip is in the superior vena cava. No pneumothorax. There has been dramatic  clearing of patchy airspace opacity compared to recent prior study. There is a small amount of residual airspace consolidation in the right base as well as a slight degree of interstitial edema in the right base. There is also a rather minimal right effusion. No new opacity. Heart size and pulmonary vascular normal. No adenopathy. No bone lesions.  IMPRESSION: Tube and catheter positions as described without pneumothorax. Dramatic clearing of opacity overall compared to recent study, although there does remain a small amount of patchy infiltrate in the right base with rather minimal right effusion. No new opacity. No change in cardiac silhouette.   Electronically Signed   By: Lowella Grip III M.D.   On: 05/17/2015 07:22     ASSESSMENT / PLAN:  PULMONARY OETT 7/30>> A: Aspiration Pneumonia, opacity clearing per CXR 8/1 Hypoxia/hypercarbia - resolved resolving rapid P:   On mechanical support, weaning today Continue monitoring CXR for clearance ABg reviewed, keep same MV Wean this am cpap 5 ps5, goal 2 hr, assess rsbi  CARDIOVASCULAR CVL 7/31 left [place] >> A:  Hypotension - dehydration vs. septic shock vs. acute blood loss Elevated troponin on admission  P:  Continue IVF Pressors PRN if hypotension recurs, for SBP >90 allow pos balance LA noted repeat  RENAL A:  Hypokalemia hypercrloemic Elevated urine output, r/o iatrogenic P:   K supplement given, continue monitor Give mag goal 4 total To 1/2 NS Urine NA  GASTROINTESTINAL A:  Hx of esophagitis, esophageal stricture R/o esophagititis GERD GIB in past  P:   PPI drip Possible GI eval - needs TF but concern with esophageal stricture - hope to extubate and avoid No blood stools noted Order hemoccult for possible GIB  HEMATOLOGIC A:   Acute Anemia - likely blood loss with unknown source  - hgb drop from 12.2 to 8.2 last 2 days P:  CBC monitoring in pm   Order retic count, LDH No anticoagulants Transfuse for hgb  <7  INFECTIOUS A:   Presumed aspiration pneumonia, lactate 5.5 on 7/30, no white count, afebrile P:   Trend lactate  BCx2 7/30>> UC 7/30>> Sputum7/30 >>  Abx:  7/30 Unasyn>> day 3/x, consider 5-7 days LA repeat  ENDOCRINE A:   No acute issue  P:   Cortisol 27 on admission CBG wnl  NEUROLOGIC A:   Chronic pain Heroin overdose P:   RASS goal: 0 Off propofol drip right now, RASS +1  FAMILY  - Updates: no family at bedside  - Inter-disciplinary family meet or Palliative Care meeting due by:   day 7   TODAY'S SUMMARY:  Intubated 7/30 for respiratory insufficiency secondary to drug OD and possible aspiration pneumonia. 8/1 CXR clearing  Ccm time 30 min  Lavon Paganini. Titus Mould, MD, [place] Pgr: [facility] \

## 2015-05-17 NOTE — Progress Notes (Signed)
Patient is resting comfortably on 2L nasal cannula, not placed on BIPAP at this time. RT will continue to monitor.

## 2015-05-17 NOTE — Procedures (Signed)
Extubation Procedure Note  Patient Details:   Name: Samarth Ogle DOB: 28-Jun-1981 MRN: [id number]   Airway Documentation:     Evaluation  O2 sats: stable throughout Complications: No apparent complications Patient did tolerate procedure well. Bilateral Breath Sounds: Clear Suctioning: Airway Yes   Patient extubated to 2L nasal cannula per MD order.  Positive cuff leak noted.  No evidence of stridor.  Sats currently 96%.  Vitals are stable.  Patient able to speak post extubation.  No apparent complications.   Alphia Moh N 05/17/2015, 1:18 PM

## 2015-05-18 ENCOUNTER — Inpatient Hospital Stay (HOSPITAL_COMMUNITY): Payer: Self-pay

## 2015-05-18 DIAGNOSIS — J9600 Acute respiratory failure, unspecified whether with hypoxia or hypercapnia: Secondary | ICD-10-CM

## 2015-05-18 LAB — MAGNESIUM: MAGNESIUM: 2 mg/dL (ref 1.7–2.4)

## 2015-05-18 LAB — CBC
HCT: 25.6 % — ABNORMAL LOW (ref 39.0–52.0)
HCT: 27.3 % — ABNORMAL LOW (ref 39.0–52.0)
Hemoglobin: 7.9 g/dL — ABNORMAL LOW (ref 13.0–17.0)
Hemoglobin: 8.3 g/dL — ABNORMAL LOW (ref 13.0–17.0)
MCH: 23.9 pg — AB (ref 26.0–34.0)
MCH: 23.5 pg — ABNORMAL LOW (ref 26.0–34.0)
MCHC: 30.9 g/dL (ref 30.0–36.0)
MCHC: 30.4 g/dL (ref 30.0–36.0)
MCV: 77.3 fL — AB (ref 78.0–100.0)
MCV: 77.6 fL — ABNORMAL LOW (ref 78.0–100.0)
Platelets: 201 10*3/uL (ref 150–400)
Platelets: 232 10*3/uL (ref 150–400)
RBC: 3.3 MIL/uL — AB (ref 4.22–5.81)
RBC: 3.53 MIL/uL — ABNORMAL LOW (ref 4.22–5.81)
RDW: 16.8 % — AB (ref 11.5–15.5)
RDW: 16.6 % — ABNORMAL HIGH (ref 11.5–15.5)
WBC: 7.6 10*3/uL (ref 4.0–10.5)
WBC: 8.2 10*3/uL (ref 4.0–10.5)

## 2015-05-18 LAB — BASIC METABOLIC PANEL
ANION GAP: 9 (ref 5–15)
BUN: 7 mg/dL (ref 6–20)
CHLORIDE: 109 mmol/L (ref 101–111)
CO2: 27 mmol/L (ref 22–32)
Calcium: 8.4 mg/dL — ABNORMAL LOW (ref 8.9–10.3)
Creatinine, Ser: 0.93 mg/dL (ref 0.61–1.24)
GFR calc Af Amer: >60 mL/min (ref >60)
GFR calc non Af Amer: >60 mL/min (ref >60)
Glucose, Bld: 101 mg/dL — ABNORMAL HIGH (ref 65–99)
POTASSIUM: 3.4 mmol/L — AB (ref 3.5–5.1)
SODIUM: 145 mmol/L (ref 135–145)

## 2015-05-18 LAB — PHOSPHORUS: PHOSPHORUS: 4 mg/dL (ref 2.5–4.6)

## 2015-05-18 LAB — TRIGLYCERIDES: Triglycerides: 138 mg/dL (ref <150)

## 2015-05-18 LAB — HIV ANTIBODY (ROUTINE TESTING W REFLEX): HIV Screen 4th Generation wRfx: NONREACTIVE

## 2015-05-18 MED ORDER — POTASSIUM CHLORIDE CRYS ER 20 MEQ PO TBCR
40.0000 meq | EXTENDED_RELEASE_TABLET | ORAL | Status: DC
  Filled 2015-05-18: qty 2

## 2015-05-18 MED ORDER — BACLOFEN 10 MG PO TABS
10.0000 mg | ORAL_TABLET | Freq: Once | ORAL | Status: AC
  Administered 2015-05-18: 10 mg via ORAL
  Filled 2015-05-18: qty 1

## 2015-05-18 MED ORDER — SODIUM CHLORIDE 0.9 % IV SOLN
8.0000 mg/h | INTRAVENOUS | Status: DC
  Administered 2015-05-18 (×2): 8 mg/h via INTRAVENOUS
  Filled 2015-05-18 (×5): qty 80

## 2015-05-18 MED ORDER — POTASSIUM CHLORIDE 20 MEQ/15ML (10%) PO SOLN
40.0000 meq | ORAL | Status: AC
  Administered 2015-05-18 (×2): 40 meq via ORAL
  Filled 2015-05-18 (×4): qty 30

## 2015-05-18 MED ORDER — FENTANYL CITRATE (PF) 100 MCG/2ML IJ SOLN: 25.0000 ug | INTRAMUSCULAR | Status: DC | PRN

## 2015-05-18 NOTE — Progress Notes (Signed)
PULMONARY / CRITICAL CARE MEDICINE   Name: Eric Hood MRN: [id number] DOB: [birth year]    ADMISSION DATE:  05/15/2015 TODAYS DATE: 05/18/2015  REFERRING MD :  Emergency Department  CHIEF COMPLAINT:  Passed out post valium OD  INITIAL PRESENTATION: Hypercarbic/hypoxic   STUDIES:  7/30 CTA chest >> no evidence of PE   SIGNIFICANT EVENTS: 7/30 intubated, central line placed 8/1 extubated   LINES/DRAINS: ETT 7/30 >> 8/1 Central line L [place] 7/30 >>8/2   BRIEF PATIENT DESCRIPTOR: 34 yo WM nonsmoker with hx of chronic back pain, heroin use, and esophogeal stricture with esophogeal bleeding, presented to [facility] ED post what was thought to be a heroin overdose on 7/30.  Reportedly he snorted heroin and woke up in the bathtub with vomitus and blood in tub. PCCM was consulted and he was intubated and central line placed. He then presented with signs of RLL infiltrate per CXR and anemia. He has remained alert and oriented. He was extubated 8/1. Patient is doing well, comfortable, now says he overdosed on valium during a panic attack and does not do IV drugs.  SUBJECTIVE:  Patient sitting in chair, very comfortable. Reports no pain, no discomfort. No coughing or sputum production, breathing comfortably. Wants to get out of room, says he feels like going for a run. Reports history of hematochezia right before coming into the hospital  VITAL SIGNS: Temp:  [98.4 F (36.9 C)-101.5 F (38.6 C)] 98.4 F (36.9 C) (08/02 0739) Pulse Rate:  [72-120] 72 (08/02 0600) Resp:  [17-47] 20 (08/02 0600) BP: (89-135)/(51-82) 129/72 mmHg (08/02 0600) SpO2:  [86 %-100 %] 98 % (08/02 0600) FiO2 (%):  [40 %] 40 % (08/01 0824) HEMODYNAMICS: CVP:  [2 mmHg-7 mmHg] 7 mmHg INTAKE / OUTPUT:  Intake/Output Summary (Last 24 hours) at 05/18/15 0750 Last data filed at 05/18/15 0600  Gross per 24 hour  Intake   2677 ml  Output   3910 ml  Net  -1233 ml    PHYSICAL EXAMINATION: General:  Pale, awake, pleasant  young man, sitting in chair, very comfortable Neuro: Awake, alert, conversing HEENT: PERRLA Cardiovascular: NSR, -m/r/g, no edema Lungs:  Mild wheezing RLL, diminished breath sounds in bases but clear Abdomen:  Soft +bs Musculoskeletal:  intact Skin:  Pale, warm  LABS:  CBC  Recent Labs Lab 05/17/15 0315 05/17/15 1448 05/18/15  WBC 7.3 7.6 7.6  HGB 8.2* 8.1* 7.9*  HCT 26.8* 26.5* 25.6*  PLT 186 204 201   Coag's  Recent Labs Lab 05/15/15 1455  APTT 26  INR 1.06   BMET  Recent Labs Lab 05/16/15 0207 05/17/15 0315 05/18/15 0500  NA 143 143 145  K 3.9 3.3* 3.4*  CL 109 111 109  CO2 25 28 27   BUN 8 7 7   CREATININE 1.12 0.99 0.93  GLUCOSE 129* 129* 101*   Electrolytes  Recent Labs Lab 05/15/15 1455 05/16/15 0207 05/17/15 0315 05/18/15 0500  CALCIUM 8.6* 7.8* 8.3* 8.4*  MG 1.5*  --  1.7 2.0  PHOS 4.1  --  2.4* 4.0   Sepsis Markers  Recent Labs Lab 05/15/15 1329 05/15/15 1455 05/15/15 2039  LATICACIDVEN 2.55* 3.9* 5.5*  PROCALCITON  --  <0.10  --    ABG  Recent Labs Lab 05/16/15 1215 05/16/15 1254 05/17/15 0415  PHART 7.415 7.416 7.395  PCO2ART 40.7 40.6 46.7*  PO2ART 80.0 65.0* 94.5   Liver Enzymes  Recent Labs Lab 05/15/15 1308 05/15/15 1455  AST 27 27  ALT 18 22  ALKPHOS 68 71  BILITOT 0.3 0.5  ALBUMIN 3.3* 3.6   Cardiac Enzymes  Recent Labs Lab 05/15/15 1455 05/15/15 1943 05/16/15 0207  TROPONINI 0.06* 0.11* 0.05*   Glucose  Recent Labs Lab 05/15/15 1643 05/15/15 2001 05/16/15 0004 05/16/15 0354 05/16/15 0816  GLUCAP 111* 96 112* 93 93    Imaging No results found.   ASSESSMENT / PLAN:  PULMONARY OETT 7/30 >> 8/1 A: Aspiration Pneumonia, rapidly resolved per CXR 8/1 Hypoxia/hypercarbia - resolved P:   Get repeat CXR this am to ensure clearance Assessment  wheezing Incentive spirometry On nasal cannula O2, sats remaining >96  CARDIOVASCULAR CVL 7/31 left [place] >> A:  Hypotension-  resolved Elevated troponin on admission  Elevated lactate on admission New PVCs vs. afib (?) controlled rate, asymptomatic P:  Remove central line today Ok to decrease fluids to <50/hr Likely no longer need for fluid, holding BP appropriately Telemetry monitoring ecg  RENAL A:  Hypokalemia Elevated urine output, c/w iatrogenic P:   Continue K supplements  24hr Urine sodium 161  GASTROINTESTINAL A:  Hx of esophagitis, GIB requiring transfusion, GERD Hx esophageal stricture with balloon dilation x 6 Recent hematochezia episode before hospitalization - likely continued esophagitis P:   PPI drip, on protonix at home 40 BID No bloody stools noted Order hemoccult Consult GI for need dilation and etiology bleed Start diet  HEMATOLOGIC A:   Acute Anemia - blood loss with likely hemodilution  - hgb drop from 12.2 to 7.9 last 3 days P:  CBC monitoring am Gi evalution   Retic count low normal Normal LDH, likely not hemolysis No anticoagulants Transfuse for hgb <7  INFECTIOUS A:   Presumed aspiration pneumonia, lactate 5.5 on 7/30, no white count, afebrile - resolving P:   Trend lactate Repeat CXR today  BCx2 7/30>> neg UC 7/30>> neg Sputum7/30 >> cxld  Abx:  7/30 Unasyn>> day 4/x, consider 5 days add 8/3  ENDOCRINE A:   No acute issue  P:   Cortisol 27 on admission CBG wnl  NEUROLOGIC A:   Chronic pain Valium OD P:   Off all meds - family doc has new Rx to control patient's pain and panic attacks that he can't remember the name of but is supposed to be less dangerous/less likely to OD May need psych eval outpt FAMILY  - Updates: no family at bedside  - Inter-disciplinary family meet or Palliative Care meeting due by:   day 7   TODAY'S SUMMARY:  Extubated 8/1, patient doing well, recommend moving to telemetry, having GI consult for bleed   To [facility], tele  Lavon Paganini. Titus Mould, MD, [place] Pgr: [facility]

## 2015-05-18 NOTE — Progress Notes (Signed)
Eric Hood [id number]  Transfer Data: 05/18/2015 6:48 PM  Attending Provider: Juanito Doom, MD  CHY:IFOYDXAJO,INOMV W, MD  Code Status: Full  Eric Hood is a 34 y.o. male patient transferred from 18M  -No acute distress noted.  -No complaints of shortness of breath.  -No complaints of chest pain.  Cardiac Monitoring:  Box # 10 in place.   Blood pressure 130/78, pulse 90, temperature 98.7 F (37.1 C), temperature source Oral, resp. rate 20, height 6' (1.829 m), weight 88 kg (194 lb 0.1 oz), SpO2 96 %.  ?  IV Fluids: IV in place, occlusive dsg intact without redness, IV cath antecubital right, condition patent and no redness    Allergies: Review of patient's allergies indicates no known allergies.  Past Medical History:  has a past medical history of Back pain; Reflux; Esophageal stricture (03/2014); Hiatal hernia; Ulcerative esophagitis (03/2014); Anxiety; GERD (gastroesophageal reflux disease); and Anemia (10/23/2014).  Past Surgical History:  has past surgical history that includes Hand surgery (Right); Esophageal dilation; and Esophagogastroduodenoscopy (egd) with propofol (N/A, 01/18/2015).  Social History:  reports that he has never smoked. He has never used smokeless tobacco. He reports that he does not drink alcohol or use illicit drugs.  Skin: intact   Patient/Family orientated to room. Instructed on how to call for help. Will continue to evaluate and treat per MD orders.

## 2015-05-18 NOTE — Progress Notes (Signed)
eLink Pharmacist-Brief Progress Note Patient Name: Eric Hood DOB: 1981/05/04 MRN: [id number]   Date of Service  05/18/2015  HPI/Events of Note  Pt no longer requiring pressors or propofol  eICU Interventions  D/c Levophed and Propofol off of MAR   Spoke with Dr. Stevenson Clinch, will d/c Levophed and Propofol off patient profile.  Cassie L. Nicole Kindred, PharmD Clinical Pharmacy Resident Pager: [phone] 05/18/2015 5:20 PM

## 2015-05-19 DIAGNOSIS — D500 Iron deficiency anemia secondary to blood loss (chronic): Secondary | ICD-10-CM

## 2015-05-19 DIAGNOSIS — T424X4D Poisoning by benzodiazepines, undetermined, subsequent encounter: Secondary | ICD-10-CM

## 2015-05-19 DIAGNOSIS — S27309D Unspecified injury of lung, unspecified, subsequent encounter: Secondary | ICD-10-CM

## 2015-05-19 LAB — CBC
HEMATOCRIT: 30.9 % — AB (ref 39.0–52.0)
HEMOGLOBIN: 9.5 g/dL — AB (ref 13.0–17.0)
MCH: 23.2 pg — ABNORMAL LOW (ref 26.0–34.0)
MCHC: 30.7 g/dL (ref 30.0–36.0)
MCV: 75.4 fL — AB (ref 78.0–100.0)
Platelets: 295 10*3/uL (ref 150–400)
RBC: 4.1 MIL/uL — AB (ref 4.22–5.81)
RDW: 16.3 % — ABNORMAL HIGH (ref 11.5–15.5)
WBC: 8 10*3/uL (ref 4.0–10.5)

## 2015-05-19 LAB — PHOSPHORUS: Phosphorus: 3.4 mg/dL (ref 2.5–4.6)

## 2015-05-19 LAB — BASIC METABOLIC PANEL
Anion gap: 9 (ref 5–15)
BUN: 9 mg/dL (ref 6–20)
CALCIUM: 9.2 mg/dL (ref 8.9–10.3)
CO2: 26 mmol/L (ref 22–32)
Chloride: 109 mmol/L (ref 101–111)
Creatinine, Ser: 0.87 mg/dL (ref 0.61–1.24)
GFR calc non Af Amer: >60 mL/min (ref >60)
Glucose, Bld: 96 mg/dL (ref 65–99)
Potassium: 3.6 mmol/L (ref 3.5–5.1)
SODIUM: 144 mmol/L (ref 135–145)

## 2015-05-19 LAB — RETICULOCYTES
RBC.: 4.76 MIL/uL (ref 4.22–5.81)
RETIC CT PCT: 0.8 % (ref 0.4–3.1)
Retic Count, Absolute: 38.1 10*3/uL (ref 19.0–186.0)

## 2015-05-19 LAB — C DIFFICILE QUICK SCREEN W PCR REFLEX
C DIFFICILE (CDIFF) TOXIN: NEGATIVE
C Diff antigen: POSITIVE — AB

## 2015-05-19 LAB — VITAMIN B12: VITAMIN B 12: 287 pg/mL (ref 180–914)

## 2015-05-19 LAB — MAGNESIUM: Magnesium: 2 mg/dL (ref 1.7–2.4)

## 2015-05-19 LAB — IRON AND TIBC
IRON: 27 ug/dL — AB (ref 45–182)
Saturation Ratios: 7 % — ABNORMAL LOW (ref 17.9–39.5)
TIBC: 368 ug/dL (ref 250–450)
UIBC: 341 ug/dL

## 2015-05-19 LAB — FOLATE: Folate: 14.7 ng/mL (ref >5.9)

## 2015-05-19 LAB — FERRITIN: FERRITIN: 78 ng/mL (ref 24–336)

## 2015-05-19 MED ORDER — METRONIDAZOLE 500 MG PO TABS
500.0000 mg | ORAL_TABLET | Freq: Three times a day (TID) | ORAL | Status: DC
  Administered 2015-05-19: 500 mg via ORAL
  Filled 2015-05-19: qty 1

## 2015-05-19 MED ORDER — METRONIDAZOLE 500 MG PO TABS
500.0000 mg | ORAL_TABLET | Freq: Three times a day (TID) | ORAL | Status: DC
Start: 2015-05-19 — End: 2015-05-20
  Administered 2015-05-19 – 2015-05-20 (×3): 500 mg via ORAL
  Filled 2015-05-19 (×3): qty 1

## 2015-05-19 MED ORDER — BACLOFEN 10 MG PO TABS
10.0000 mg | ORAL_TABLET | Freq: Three times a day (TID) | ORAL | Status: DC
  Administered 2015-05-19 (×2): 10 mg via ORAL
  Filled 2015-05-19 (×2): qty 1

## 2015-05-19 MED ORDER — ACETAMINOPHEN 325 MG PO TABS: 650.0000 mg | ORAL_TABLET | Freq: Four times a day (QID) | ORAL | Status: DC | PRN

## 2015-05-19 MED ORDER — PANTOPRAZOLE SODIUM 40 MG PO TBEC
40.0000 mg | DELAYED_RELEASE_TABLET | Freq: Two times a day (BID) | ORAL | Status: DC
  Administered 2015-05-19: 40 mg via ORAL
  Filled 2015-05-19: qty 1

## 2015-05-19 MED ORDER — CYANOCOBALAMIN 1000 MCG/ML IJ SOLN
1000.0000 ug | Freq: Every day | INTRAMUSCULAR | Status: DC
  Administered 2015-05-19: 1000 ug via SUBCUTANEOUS
  Filled 2015-05-19 (×2): qty 1

## 2015-05-19 MED ORDER — SODIUM CHLORIDE 0.9 % IV SOLN
510.0000 mg | Freq: Once | INTRAVENOUS | Status: AC
  Administered 2015-05-19: 510 mg via INTRAVENOUS
  Filled 2015-05-19: qty 17

## 2015-05-19 MED ORDER — DIPHENHYDRAMINE HCL 25 MG PO CAPS
25.0000 mg | ORAL_CAPSULE | Freq: Every evening | ORAL | Status: DC | PRN
  Administered 2015-05-19: 25 mg via ORAL
  Filled 2015-05-19: qty 1

## 2015-05-19 NOTE — Progress Notes (Signed)
McClung MD made aware that patient is refusing protonix drip.

## 2015-05-19 NOTE — Progress Notes (Signed)
Patient requesting something to help him sleep, notified Dr. Thereasa Solo.

## 2015-05-19 NOTE — Progress Notes (Signed)
Utilization Review completed. Azrielle Springsteen RN BSN CM 

## 2015-05-19 NOTE — Progress Notes (Signed)
[place] TEAM 1 - Stepdown/ICU TEAM Progress Note  Eric Hood [id number] DOB: 1981/08/01 DOA: 05/15/2015 PCP: Eulas Post, MD  Admit HPI / Brief Narrative: 34 yo M nonsmoker with hx of chronic back pain, heroin use, and esophogeal stricture with esophogeal bleeding, presented to [facility] ED post what was thought to be a heroin overdose on 7/30. Reportedly he snorted heroin and woke up in the bathtub with vomitus and blood in tub. PCCM was consulted and he was intubated and central line placed. He then developed signs of RLL infiltrate per CXR and anemia. He was extubated 8/1 and subsequently reported that he overdosed on valium during a panic attack and does not do IV drugs.  HPI/Subjective: Patient is resting comfortably in a bedside chair.  He has no complaints at this time.  He denies chest pain shortness of breath fevers chills nausea or vomiting.  He has not yet tried to eat solid food.  He has had a bowel movement this afternoon which she describes as loose but tan in color.  His nurse confirms that he has been having diarrhea.  Assessment/Plan:  Drug overdose - Hypoxic and Hypercarbic Respiratory Failure  Patient vehemently denies IV drug abuse but does admit to taking multiple doses of a benzodiazepine to help him sleep and feels that they "all called up on him at once"  Aspiration Pneumonia rapidly resolved per CXR 8/1 - to complete 5 days of empiric antibiotic coverage  Hypotension Resolved  Hypokalemia Potassium has normalized  History of esophagitis with prior GI bleed requiring transfusion - history esophageal stricture status post dilation 6 If the patient is able tolerate a soft diet I do not feel that he has to remain in the hospital to undergo a GI evaluation as this appears to be a chronic issue - if however he is not able tolerate oral intake then he will of course require an inpatient GI consultation  Microcytic Anemia of unclear etiology  Hgb nadir of  7.9 this hospitalization - currently climbing - suspect chronic blood loss from ulcerative esophagitis leading to Fe deficiency and heme + stool - Fe studies confirm Fe deficiency - load w/ IV Fe today - if hemoglobin remains stable overnight this could be further evaluated and follow-up with his chronic GI physician  B12 deficiency  Begin replacement via subcutaneous route - transition to oral supplementation at time of discharge - will need f/u in 8-12 weeks   C diff colonization Antigen + but toxin negative - nonetheless the patient is presently on broad-spectrum antibiotic and is having diarrhea therefore I feel it is most appropriate to treat him with Flagyl for a planned 10 day course  Code Status: FULL Family Communication: no family present at time of exam Disposition Plan: Possible discharge home in a.m. if hemoglobin stable and able to tolerate diet  Consultants: PCCM  Procedures: 7/30 intubated, central line placed 8/01 extubated  Antibiotics: Unasyn 7/30 > Flagyl 8/3 >  DVT prophylaxis: SCDs  Objective: Blood pressure 119/65, pulse 75, temperature 98.2 F (36.8 C), temperature source Oral, resp. rate 18, height 6' (1.829 m), weight 78.8 kg (173 lb 11.6 oz), SpO2 100 %.  Intake/Output Summary (Last 24 hours) at 05/19/15 1044 Last data filed at 05/18/15 1800  Gross per 24 hour  Intake    235 ml  Output   1375 ml  Net  -1140 ml   Exam: General: No acute respiratory distress Lungs: Clear to auscultation bilaterally without wheezes or crackles Cardiovascular: Regular  rate and rhythm without murmur gallop or rub normal S1 and S2 Abdomen: Nontender, nondistended, soft, bowel sounds positive, no rebound, no ascites, no appreciable mass Extremities: No significant cyanosis, clubbing, or edema bilateral lower extremities  Data Reviewed: Basic Metabolic Panel:  Recent Labs Lab 05/15/15 1455 05/16/15 0207 05/17/15 0315 05/18/15 0500 05/19/15 0039  NA 140 143 143  145 144  K 3.8 3.9 3.3* 3.4* 3.6  CL 103 109 111 109 109  CO2 27 25 28 27 26   GLUCOSE 136* 129* 129* 101* 96  BUN 8 8 7 7 9   CREATININE 0.99 1.12 0.99 0.93 0.87  CALCIUM 8.6* 7.8* 8.3* 8.4* 9.2  MG 1.5*  --  1.7 2.0 2.0  PHOS 4.1  --  2.4* 4.0 3.4    CBC:  Recent Labs Lab 05/17/15 0315 05/17/15 1448 05/18/15 05/18/15 1250 05/19/15 0039  WBC 7.3 7.6 7.6 8.2 8.0  HGB 8.2* 8.1* 7.9* 8.3* 9.5*  HCT 26.8* 26.5* 25.6* 27.3* 30.9*  MCV 77.9* 77.9* 77.6* 77.3* 75.4*  PLT 186 204 201 232 295    Liver Function Tests:  Recent Labs Lab 05/15/15 1308 05/15/15 1455  AST 27 27  ALT 18 22  ALKPHOS 68 71  BILITOT 0.3 0.5  PROT 6.0* 6.4*  ALBUMIN 3.3* 3.6    Recent Labs Lab 05/15/15 1455  LIPASE 15*  AMYLASE 58   Coags:  Recent Labs Lab 05/15/15 1455  INR 1.06    Recent Labs Lab 05/15/15 1455  APTT 26    Cardiac Enzymes:  Recent Labs Lab 05/15/15 1455 05/15/15 1943 05/16/15 0207  TROPONINI 0.06* 0.11* 0.05*    CBG:  Recent Labs Lab 05/15/15 1643 05/15/15 2001 05/16/15 0004 05/16/15 0354 05/16/15 0816  GLUCAP 111* 96 112* 93 93    Recent Results (from the past 240 hour(s))  Urine culture     Status: None   Collection Time: 05/15/15  2:13 PM  Result Value Ref Range Status   Specimen Description URINE, RANDOM  Final   Special Requests ADDED 2214  Final   Culture NO GROWTH 2 DAYS  Final   Report Status 05/17/2015 FINAL  Final  Culture, blood (routine x 2)     Status: None (Preliminary result)   Collection Time: 05/15/15  2:45 PM  Result Value Ref Range Status   Specimen Description BLOOD RIGHT HAND  Final   Special Requests BOTTLES DRAWN AEROBIC AND ANAEROBIC 5CC  Final   Culture NO GROWTH 3 DAYS  Final   Report Status PENDING  Incomplete  Culture, blood (routine x 2)     Status: None (Preliminary result)   Collection Time: 05/15/15  2:50 PM  Result Value Ref Range Status   Specimen Description BLOOD LEFT HAND  Final   Special Requests  BOTTLES DRAWN AEROBIC AND ANAEROBIC 5CC  Final   Culture NO GROWTH 3 DAYS  Final   Report Status PENDING  Incomplete  MRSA PCR Screening     Status: None   Collection Time: 05/15/15  4:45 PM  Result Value Ref Range Status   MRSA by PCR NEGATIVE NEGATIVE Final    Comment:        The GeneXpert MRSA Assay (FDA approved for NASAL specimens only), is one component of a comprehensive MRSA colonization surveillance program. It is not intended to diagnose MRSA infection nor to guide or monitor treatment for MRSA infections.      Studies:   Recent x-ray studies have been reviewed in detail by the Attending Physician  Scheduled Meds:  Scheduled Meds: . ampicillin-sulbactam (UNASYN) IV  3 g Intravenous Q6H  . antiseptic oral rinse  7 mL Mouth Rinse QID  . chlorhexidine  15 mL Mouth Rinse BID    Time spent on care of this patient: 35 mins   MCCLUNG,JEFFREY T , MD   [facility] Office  [phone] Pager - Text Page per Shea Evans as per below:  On-Call/Text Page:      [email or web address]      password [email or web address]  If 7PM-7AM, please contact night-coverage [email or web address] Password [email or web address] 05/19/2015, 10:44 AM   LOS: 4 days

## 2015-05-19 NOTE — Progress Notes (Signed)
Notified Dr. Thereasa Solo of positive C.diff.

## 2015-05-20 ENCOUNTER — Inpatient Hospital Stay (HOSPITAL_COMMUNITY): Payer: 59 | Admitting: Certified Registered"

## 2015-05-20 ENCOUNTER — Encounter (HOSPITAL_COMMUNITY)
Admission: EM | Disposition: A | Discharge status: Home / Self Care | Payer: Self-pay | Source: Home / Self Care | Attending: Pulmonary Disease

## 2015-05-20 ENCOUNTER — Encounter (HOSPITAL_COMMUNITY): Payer: Self-pay | Admitting: Certified Registered"

## 2015-05-20 ENCOUNTER — Inpatient Hospital Stay (HOSPITAL_COMMUNITY): Payer: Self-pay | Admitting: Certified Registered"

## 2015-05-20 DIAGNOSIS — R1310 Dysphagia, unspecified: Secondary | ICD-10-CM

## 2015-05-20 DIAGNOSIS — D509 Iron deficiency anemia, unspecified: Secondary | ICD-10-CM

## 2015-05-20 DIAGNOSIS — R0600 Dyspnea, unspecified: Secondary | ICD-10-CM

## 2015-05-20 DIAGNOSIS — J9601 Acute respiratory failure with hypoxia: Secondary | ICD-10-CM

## 2015-05-20 DIAGNOSIS — T50901S Poisoning by unspecified drugs, medicaments and biological substances, accidental (unintentional), sequela: Secondary | ICD-10-CM

## 2015-05-20 HISTORY — PX: BALLOON DILATION: SHX5330

## 2015-05-20 HISTORY — PX: ESOPHAGOGASTRODUODENOSCOPY: SHX5428

## 2015-05-20 LAB — CBC
HEMATOCRIT: 31.2 % — AB (ref 39.0–52.0)
HEMOGLOBIN: 9.7 g/dL — AB (ref 13.0–17.0)
MCH: 23.2 pg — ABNORMAL LOW (ref 26.0–34.0)
MCHC: 31.1 g/dL (ref 30.0–36.0)
MCV: 74.6 fL — AB (ref 78.0–100.0)
PLATELETS: 303 10*3/uL (ref 150–400)
RBC: 4.18 MIL/uL — ABNORMAL LOW (ref 4.22–5.81)
RDW: 15.8 % — ABNORMAL HIGH (ref 11.5–15.5)
WBC: 8.2 10*3/uL (ref 4.0–10.5)

## 2015-05-20 LAB — CULTURE, BLOOD (ROUTINE X 2)
CULTURE: NO GROWTH
Culture: NO GROWTH

## 2015-05-20 SURGERY — EGD (ESOPHAGOGASTRODUODENOSCOPY)
Anesthesia: Monitor Anesthesia Care

## 2015-05-20 MED ORDER — METRONIDAZOLE 500 MG PO TABS: 500.0000 mg | ORAL_TABLET | Freq: Three times a day (TID) | ORAL | Status: DC

## 2015-05-20 MED ORDER — PROPOFOL INFUSION 10 MG/ML OPTIME
INTRAVENOUS | Status: DC | PRN
  Administered 2015-05-20: 300 ug/kg/min via INTRAVENOUS

## 2015-05-20 MED ORDER — SODIUM CHLORIDE 0.9 % IV SOLN: INTRAVENOUS | Status: DC

## 2015-05-20 MED ORDER — BUTAMBEN-TETRACAINE-BENZOCAINE 2-2-14 % EX AERO
INHALATION_SPRAY | CUTANEOUS | Status: DC | PRN
  Administered 2015-05-20: 2 via TOPICAL

## 2015-05-20 MED ORDER — CYANOCOBALAMIN 250 MCG PO TABS: 250.0000 ug | ORAL_TABLET | Freq: Every day | ORAL | Status: DC

## 2015-05-20 MED ORDER — LACTATED RINGERS IV SOLN
INTRAVENOUS | Status: DC
  Administered 2015-05-20: 1000 mL via INTRAVENOUS
  Administered 2015-05-20: 11:00:00 via INTRAVENOUS

## 2015-05-20 MED ORDER — FENTANYL CITRATE (PF) 100 MCG/2ML IJ SOLN: 25.0000 ug | INTRAMUSCULAR | Status: DC | PRN

## 2015-05-20 NOTE — Progress Notes (Signed)
Spoke with Dr Sloan Leiter, patient does not require psych consultation.

## 2015-05-20 NOTE — Anesthesia Postprocedure Evaluation (Signed)
  Anesthesia Post-op Note  Patient: Eric Hood  Procedure(s) Performed: Procedure(s): ESOPHAGOGASTRODUODENOSCOPY (EGD) (N/A) BALLOON DILATION (N/A)  Patient Location: PACU  Anesthesia Type: MAC  Level of Consciousness: awake and alert   Airway and Oxygen Therapy: Patient Spontanous Breathing  Post-op Pain: Controlled  Post-op Assessment: Post-op Vital signs reviewed, Patient's Cardiovascular Status Stable and Respiratory Function Stable  Post-op Vital Signs: Reviewed  Filed Vitals:   05/20/15 1145  BP: 140/77  Pulse: 78  Temp:   Resp: 20    Complications: No apparent anesthesia complications

## 2015-05-20 NOTE — Op Note (Signed)
[facility] [place], [address]   ENDOSCOPY PROCEDURE REPORT  PATIENT: Eric Hood, Eric Hood  MR#: [id number] BIRTHDATE: Oct 16, 1981 , 33  yrs. old GENDER: male ENDOSCOPIST: Milus Banister, MD PROCEDURE DATE:  05/20/2015 PROCEDURE:  EGD w/ balloon dilation ASA CLASS:     Class II INDICATIONS:  recurrent dysphagia, worse lately.  Several previous EGDs with dilation for mid esophagus stricture (most recent 01/2015), biopsies previously have been neg for EOE.. MEDICATIONS: Monitored anesthesia care TOPICAL ANESTHETIC: none  DESCRIPTION OF PROCEDURE: After the risks benefits and alternatives of the procedure were thoroughly explained, informed consent was obtained.  The Pentax Gastroscope [id number] endoscope was introduced through the mouth and advanced to the second portion of the duodenum , Without limitations.  The instrument was slowly withdrawn as the mucosa was fully examined.  The previously noted mid esophagus stricture was located.  The proximal edge was about 30cm from the incisors.  I could not advance 1mm diameter adult gastroscope through the stricture. Lumen estimated to be 6-78mm.  Using a CRE TTS wire guided balloon I dilated the stricture up to 11mm.  Following dilation I was able to advance the gastroscope into the stomach and noted 3cm hiatal hernia.  The stricture was about 4-5cm long and following dilation there wer several superficial mucosal tears with self limited oozing.  The examination was otherwise normal.  Retroflexed views revealed no abnormalities.     The scope was then withdrawn from the patient and the procedure completed. COMPLICATIONS: There were no immediate complications.  ENDOSCOPIC IMPRESSION: The previously noted mid esophagus stricture was located.  The proximal edge was about 30cm from the incisors.  I could not advance 73mm diameter adult gastroscope through the stricture. Lumen estimated to be 6-42mm.   Using a CRE TTS wire guided balloon I dilated the stricture up to 22mm.  Following dilation I was able to advance the gastroscope into the stomach and noted 3cm hiatal hernia.  The stricture was about 4-5cm long and following dilation there wer several superficial mucosal tears with self limited oozing.  The examination was otherwise normal  RECOMMENDATIONS: Continue twice daily PPI (best taken 20-30 min prior to breakfast and dinner meals), chew your food well, eat slowly and take small bites.  I will forward this report to Dr.  Fuller Plan so that he can schedule repeat EGD/dilation in the next few weeks.   eSigned:  Milus Banister, MD 05/20/2015 11:38 AM    CC: Lucio Edward, MD

## 2015-05-20 NOTE — Progress Notes (Signed)
Pt verbalizes understanding of all discharge instructions and discharged home in stable condition. 

## 2015-05-20 NOTE — Interval H&P Note (Signed)
History and Physical Interval Note:  05/20/2015 11:05 AM  Eric Hood  has presented today for surgery, with the diagnosis of dysphagia, hx esophageal stricture  The various methods of treatment have been discussed with the patient and family. After consideration of risks, benefits and other options for treatment, the patient has consented to  Procedure(s): ESOPHAGOGASTRODUODENOSCOPY (EGD) (N/A) BALLOON DILATION (N/A) as a surgical intervention .  The patient's history has been reviewed, patient examined, no change in status, stable for surgery.  I have reviewed the patient's chart and labs.  Questions were answered to the patient's satisfaction.     Milus Banister

## 2015-05-20 NOTE — H&P (View-Only) (Signed)
[place] TEAM 1 - Stepdown/ICU TEAM Progress Note  Mohannad Olivero TIW:[id number] DOB: Sep 14, 1981 DOA: 05/15/2015 PCP: Eulas Post, MD  Admit HPI / Brief Narrative: 34 yo M nonsmoker with hx of chronic back pain, heroin use, and esophogeal stricture with esophogeal bleeding, presented to [facility] ED post what was thought to be a heroin overdose on 7/30. Reportedly he snorted heroin and woke up in the bathtub with vomitus and blood in tub. PCCM was consulted and he was intubated and central line placed. He then developed signs of RLL infiltrate per CXR and anemia. He was extubated 8/1 and subsequently reported that he overdosed on valium during a panic attack and does not do IV drugs.  HPI/Subjective: Patient is resting comfortably in a bedside chair.  He has no complaints at this time.  He denies chest pain shortness of breath fevers chills nausea or vomiting.  He has not yet tried to eat solid food.  He has had a bowel movement this afternoon which she describes as loose but tan in color.  His nurse confirms that he has been having diarrhea.  Assessment/Plan:  Drug overdose - Hypoxic and Hypercarbic Respiratory Failure  Patient vehemently denies IV drug abuse but does admit to taking multiple doses of a benzodiazepine to help him sleep and feels that they "all called up on him at once"  Aspiration Pneumonia rapidly resolved per CXR 8/1 - to complete 5 days of empiric antibiotic coverage  Hypotension Resolved  Hypokalemia Potassium has normalized  History of esophagitis with prior GI bleed requiring transfusion - history esophageal stricture status post dilation 6 If the patient is able tolerate a soft diet I do not feel that he has to remain in the hospital to undergo a GI evaluation as this appears to be a chronic issue - if however he is not able tolerate oral intake then he will of course require an inpatient GI consultation  Microcytic Anemia of unclear etiology  Hgb nadir of  7.9 this hospitalization - currently climbing - suspect chronic blood loss from ulcerative esophagitis leading to Fe deficiency and heme + stool - Fe studies confirm Fe deficiency - load w/ IV Fe today - if hemoglobin remains stable overnight this could be further evaluated and follow-up with his chronic GI physician  B12 deficiency  Begin replacement via subcutaneous route - transition to oral supplementation at time of discharge - will need f/u in 8-12 weeks   C diff colonization Antigen + but toxin negative - nonetheless the patient is presently on broad-spectrum antibiotic and is having diarrhea therefore I feel it is most appropriate to treat him with Flagyl for a planned 10 day course  Code Status: FULL Family Communication: no family present at time of exam Disposition Plan: Possible discharge home in a.m. if hemoglobin stable and able to tolerate diet  Consultants: PCCM  Procedures: 7/30 intubated, central line placed 8/01 extubated  Antibiotics: Unasyn 7/30 > Flagyl 8/3 >  DVT prophylaxis: SCDs  Objective: Blood pressure 119/65, pulse 75, temperature 98.2 F (36.8 C), temperature source Oral, resp. rate 18, height 6' (1.829 m), weight 78.8 kg (173 lb 11.6 oz), SpO2 100 %.  Intake/Output Summary (Last 24 hours) at 05/19/15 1044 Last data filed at 05/18/15 1800  Gross per 24 hour  Intake    235 ml  Output   1375 ml  Net  -1140 ml   Exam: General: No acute respiratory distress Lungs: Clear to auscultation bilaterally without wheezes or crackles Cardiovascular: Regular  rate and rhythm without murmur gallop or rub normal S1 and S2 Abdomen: Nontender, nondistended, soft, bowel sounds positive, no rebound, no ascites, no appreciable mass Extremities: No significant cyanosis, clubbing, or edema bilateral lower extremities  Data Reviewed: Basic Metabolic Panel:  Recent Labs Lab 05/15/15 1455 05/16/15 0207 05/17/15 0315 05/18/15 0500 05/19/15 0039  NA 140 143 143  145 144  K 3.8 3.9 3.3* 3.4* 3.6  CL 103 109 111 109 109  CO2 27 25 28 27 26   GLUCOSE 136* 129* 129* 101* 96  BUN 8 8 7 7 9   CREATININE 0.99 1.12 0.99 0.93 0.87  CALCIUM 8.6* 7.8* 8.3* 8.4* 9.2  MG 1.5*  --  1.7 2.0 2.0  PHOS 4.1  --  2.4* 4.0 3.4    CBC:  Recent Labs Lab 05/17/15 0315 05/17/15 1448 05/18/15 05/18/15 1250 05/19/15 0039  WBC 7.3 7.6 7.6 8.2 8.0  HGB 8.2* 8.1* 7.9* 8.3* 9.5*  HCT 26.8* 26.5* 25.6* 27.3* 30.9*  MCV 77.9* 77.9* 77.6* 77.3* 75.4*  PLT 186 204 201 232 295    Liver Function Tests:  Recent Labs Lab 05/15/15 1308 05/15/15 1455  AST 27 27  ALT 18 22  ALKPHOS 68 71  BILITOT 0.3 0.5  PROT 6.0* 6.4*  ALBUMIN 3.3* 3.6    Recent Labs Lab 05/15/15 1455  LIPASE 15*  AMYLASE 58   Coags:  Recent Labs Lab 05/15/15 1455  INR 1.06    Recent Labs Lab 05/15/15 1455  APTT 26    Cardiac Enzymes:  Recent Labs Lab 05/15/15 1455 05/15/15 1943 05/16/15 0207  TROPONINI 0.06* 0.11* 0.05*    CBG:  Recent Labs Lab 05/15/15 1643 05/15/15 2001 05/16/15 0004 05/16/15 0354 05/16/15 0816  GLUCAP 111* 96 112* 93 93    Recent Results (from the past 240 hour(s))  Urine culture     Status: None   Collection Time: 05/15/15  2:13 PM  Result Value Ref Range Status   Specimen Description URINE, RANDOM  Final   Special Requests ADDED 2214  Final   Culture NO GROWTH 2 DAYS  Final   Report Status 05/17/2015 FINAL  Final  Culture, blood (routine x 2)     Status: None (Preliminary result)   Collection Time: 05/15/15  2:45 PM  Result Value Ref Range Status   Specimen Description BLOOD RIGHT HAND  Final   Special Requests BOTTLES DRAWN AEROBIC AND ANAEROBIC 5CC  Final   Culture NO GROWTH 3 DAYS  Final   Report Status PENDING  Incomplete  Culture, blood (routine x 2)     Status: None (Preliminary result)   Collection Time: 05/15/15  2:50 PM  Result Value Ref Range Status   Specimen Description BLOOD LEFT HAND  Final   Special Requests  BOTTLES DRAWN AEROBIC AND ANAEROBIC 5CC  Final   Culture NO GROWTH 3 DAYS  Final   Report Status PENDING  Incomplete  MRSA PCR Screening     Status: None   Collection Time: 05/15/15  4:45 PM  Result Value Ref Range Status   MRSA by PCR NEGATIVE NEGATIVE Final    Comment:        The GeneXpert MRSA Assay (FDA approved for NASAL specimens only), is one component of a comprehensive MRSA colonization surveillance program. It is not intended to diagnose MRSA infection nor to guide or monitor treatment for MRSA infections.      Studies:   Recent x-ray studies have been reviewed in detail by the Attending Physician  Scheduled Meds:  Scheduled Meds: . ampicillin-sulbactam (UNASYN) IV  3 g Intravenous Q6H  . antiseptic oral rinse  7 mL Mouth Rinse QID  . chlorhexidine  15 mL Mouth Rinse BID    Time spent on care of this patient: 35 mins   Vahe Pienta T , MD   [facility] Office  [phone] Pager - Text Page per Shea Evans as per below:  On-Call/Text Page:      [email or web address]      password [email or web address]  If 7PM-7AM, please contact night-coverage [email or web address] Password [email or web address] 05/19/2015, 10:44 AM   LOS: 4 days

## 2015-05-20 NOTE — Anesthesia Preprocedure Evaluation (Addendum)
Anesthesia Evaluation  Patient identified by MRN, date of birth, ID band Patient awake    Reviewed: Allergy & Precautions, H&P , NPO status , Patient's Chart, lab work & pertinent test results  Airway Mallampati: II  TM Distance: >3 FB Neck ROM: Full    Dental no notable dental hx. (+) Teeth Intact, Dental Advisory Given   Pulmonary neg pulmonary ROS,  breath sounds clear to auscultation  Pulmonary exam normal       Cardiovascular negative cardio ROS  Rhythm:Regular Rate:Normal     Neuro/Psych Anxiety negative neurological ROS     GI/Hepatic Neg liver ROS, hiatal hernia, PUD, GERD-  Medicated,  Endo/Other  negative endocrine ROS  Renal/GU negative Renal ROS  negative genitourinary   Musculoskeletal   Abdominal   Peds  Hematology negative hematology ROS (+) anemia ,   Anesthesia Other Findings   Reproductive/Obstetrics negative OB ROS                            Anesthesia Physical Anesthesia Plan  ASA: II  Anesthesia Plan: MAC   Post-op Pain Management:    Induction: Intravenous  Airway Management Planned: Nasal Cannula  Additional Equipment:   Intra-op Plan:   Post-operative Plan:   Informed Consent: I have reviewed the patients History and Physical, chart, labs and discussed the procedure including the risks, benefits and alternatives for the proposed anesthesia with the patient or authorized representative who has indicated his/her understanding and acceptance.   Dental advisory given  Plan Discussed with: CRNA  Anesthesia Plan Comments:         Anesthesia Quick Evaluation

## 2015-05-20 NOTE — Discharge Summary (Addendum)
PATIENT DETAILS Name: Eric Hood Age: 34 y.o. Sex: male Date of Birth: 01-02-1981 MRN: [id number]. Admitting Physician: Juanito Doom, MD GHW:[id number],VELFY W, MD  Admit Date: 05/15/2015 Discharge date: 05/20/2015  Recommendations for Outpatient Follow-up:  1. Please repeat CBC and chemistries at next visit  2. Blood cultures negative so far-please follow till final  3. Minimize use of benzodiazepine's and narcotics  4. May need referral to psychiatry for treatment of anxiety and insomnia  PRIMARY DISCHARGE DIAGNOSIS:  Active Problems:   Respiratory failure   Acute lung injury   Acute respiratory failure with hypoxia   Respiratory distress      PAST MEDICAL HISTORY: Past Medical History  Diagnosis Date  . Back pain   . Reflux   . Esophageal stricture 03/2014  . Hiatal hernia   . Ulcerative esophagitis 03/2014    severe  . Anxiety   . GERD (gastroesophageal reflux disease)   . Anemia 10/23/2014    DISCHARGE MEDICATIONS: Current Discharge Medication List    START taking these medications   Details  metroNIDAZOLE (FLAGYL) 500 MG tablet Take 1 tablet (500 mg total) by mouth every 8 (eight) hours. Qty: 27 tablet, Refills: 0    vitamin B-12 (CYANOCOBALAMIN) 250 MCG tablet Take 1 tablet (250 mcg total) by mouth daily. Qty: 30 tablet, Refills: 0      CONTINUE these medications which have NOT CHANGED   Details  baclofen (LIORESAL) 10 MG tablet Take 1 tablet (10 mg total) by mouth 3 (three) times daily. Qty: 30 each, Refills: 0    pantoprazole (PROTONIX) 40 MG tablet TAKE 1 TABLET (40 MG TOTAL) BY MOUTH 2 (TWO) TIMES DAILY. Qty: 180 tablet, Refills: 3    ranitidine (ZANTAC) 300 MG tablet Take 1 tablet (300 mg total) by mouth at bedtime. Qty: 30 tablet, Refills: 11        ALLERGIES:  No Known Allergies  BRIEF HPI:  See H&P, Labs, Consult and Test reports for all details in brief, 34 yo M nonsmoker with hx of chronic back pain, heroin use, and  esophogeal stricture with esophogeal bleeding, presented to [facility] ED post what was thought to be a heroin overdose on 7/30. Reportedly he snorted heroin and woke up in the bathtub with vomitus and blood in tub. PCCM was consulted and he was intubated and central line placed. He then developed signs of RLL infiltrate per CXR and anemia. He was extubated 8/1 and subsequently reported that he overdosed on valium during a panic attack and does not do IV drugs.  CONSULTATIONS:   pulmonary/intensive care and GI  PERTINENT RADIOLOGIC STUDIES: Ct Angio Chest Pe W/cm &/or Wo Cm  05/15/2015   CLINICAL DATA:  Respiratory distress after drug use.  Hypoxia.  EXAM: CT ANGIOGRAPHY CHEST WITH CONTRAST  TECHNIQUE: Multidetector CT imaging of the chest was performed using the standard protocol during bolus administration of intravenous contrast. Multiplanar CT image reconstructions and MIPs were obtained to evaluate the vascular anatomy.  CONTRAST:  64mL OMNIPAQUE IOHEXOL 300 MG/ML  SOLN  COMPARISON:  10/23/2014  FINDINGS: Endotracheal tube in adequate position. Lungs are adequately inflated and demonstrate a moderate bilateral nodular airspace process most prominent over the mid to upper lungs right worse than left. Mild right posterior basilar atelectasis. No effusion. Airways are within normal.  Heart is normal in size. There is no evidence of pulmonary embolism. There is a 1.1 cm AP window lymph node. There is a 1 cm right subcarinal lymph node as these  lymph nodes are likely reactive. There is an air-fluid level over the upper esophagus with fluid throughout the mid to distal esophagus which may be due to reflux versus dysmotility. Remaining mediastinal structures are within normal.  Images through the upper abdomen are within normal. Remainder of the exam is unchanged.  Review of the MIP images confirms the above findings.  IMPRESSION: No evidence of pulmonary embolism.  Diffuse bilateral nodular airspace process most  prominent over the mid to upper lungs right worse than left. Minimal reactive mediastinal adenopathy. Findings are likely due to infection. Recommend follow-up CT 4 weeks.  Moderate fluid within the esophagus with air-fluid level over the upper esophagus likely due to reflux.   Electronically Signed   By: Marin Olp M.D.   On: 05/15/2015 16:42   Dg Chest Port 1 View  05/18/2015   CLINICAL DATA:  Wheezing.  EXAM: PORTABLE CHEST - 1 VIEW  COMPARISON:  05/17/2015  FINDINGS: Endotracheal tube has been removed. Left central line tip overlies the superior vena cava, unchanged. There has been significant improvement in aeration of the right lung base. Possible developing infiltrate in the left midlung zone. There is perihilar density bilaterally which is minimal. No definite pulmonary edema.  IMPRESSION: 1. Significant improvement in aeration of the right lung base. 2. Suspect developing infiltrate in the left mid lung zone. 3. Status postextubation.   Electronically Signed   By: Nolon Nations M.D.   On: 05/18/2015 12:35   Dg Chest Port 1 View  05/17/2015   CLINICAL DATA:  Hypoxia  EXAM: PORTABLE CHEST - 1 VIEW  COMPARISON:  May 15, 2015  FINDINGS: Endotracheal tube tip is 4.8 cm above the carina. Central catheter tip is in the superior vena cava. No pneumothorax. There has been dramatic clearing of patchy airspace opacity compared to recent prior study. There is a small amount of residual airspace consolidation in the right base as well as a slight degree of interstitial edema in the right base. There is also a rather minimal right effusion. No new opacity. Heart size and pulmonary vascular normal. No adenopathy. No bone lesions.  IMPRESSION: Tube and catheter positions as described without pneumothorax. Dramatic clearing of opacity overall compared to recent study, although there does remain a small amount of patchy infiltrate in the right base with rather minimal right effusion. No new opacity. No change in  cardiac silhouette.   Electronically Signed   By: Lowella Grip III M.D.   On: 05/17/2015 07:22   Dg Chest Port 1 View  05/15/2015   CLINICAL DATA:  Central line placement.  Acute lung injury.  EXAM: PORTABLE CHEST - 1 VIEW  COMPARISON:  Chest radiograph earlier this day at 1651 hour. Chest CT earlier this day.  FINDINGS: Tip of the left subclavian central venous catheter in the mid SVC. No pneumothorax. Endotracheal tube at the thoracic inlet, 5.4 cm from the current. Patient is rotated to the right. Progressive patchy and nodular airspace opacity in the right lower lobe with questionable developing pleural effusion. Diffuse ground-glass nodular opacities in an upper lobe predominant distribution again seen. Cardiomediastinal contours are normal.  IMPRESSION: 1. Tip of the left central line in the mid SVC.  No pneumothorax. 2. Diffuse bilateral multifocal airspace opacity, with progressive opacity at the right lung base. Question developing right pleural effusion.   Electronically Signed   By: Jeb Levering M.D.   On: 05/15/2015 23:58   Dg Chest Port 1 View  05/15/2015   CLINICAL DATA:  Acute respiratory failure.  EXAM: PORTABLE CHEST - 1 VIEW  COMPARISON:  CT and radiographs 05/15/2015  FINDINGS: Endotracheal tube in good position 4.6 cm from carina. Bilateral upper lobe nodular airspace disease again demonstrated without improvement. No pneumothorax. No pleural fluid  IMPRESSION: 1. Endotracheal tube in good position. 2. Bilateral nodular airspace disease.   Electronically Signed   By: Suzy Bouchard M.D.   On: 05/15/2015 17:02   Dg Chest Portable 1 View  05/15/2015   CLINICAL DATA:  Shortness of breath and hemoptysis for 1 day. Recent drug overdose.  EXAM: PORTABLE CHEST - 1 VIEW  COMPARISON:  12/05/2014  FINDINGS: New asymmetric airspace disease is seen in both lungs, with greatest involvement in the right lung base. This may be due to hemorrhage, asymmetric edema, or infection. No evidence of  pneumothorax or pleural effusion. Heart size and mediastinal contours are within normal limits.  IMPRESSION: New asymmetric airspace disease, with differential diagnosis including hemorrhage, asymmetric edema, and infection.   Electronically Signed   By: Earle Gell M.D.   On: 05/15/2015 13:25     PERTINENT LAB RESULTS: CBC:  Recent Labs  05/19/15 0039 05/20/15 0554  WBC 8.0 8.2  HGB 9.5* 9.7*  HCT 30.9* 31.2*  PLT 295 303   CMET CMP     Component Value Date/Time   NA 144 05/19/2015 0039   K 3.6 05/19/2015 0039   CL 109 05/19/2015 0039   CO2 26 05/19/2015 0039   GLUCOSE 96 05/19/2015 0039   BUN 9 05/19/2015 0039   CREATININE 0.87 05/19/2015 0039   CALCIUM 9.2 05/19/2015 0039   PROT 6.4* 05/15/2015 1455   ALBUMIN 3.6 05/15/2015 1455   AST 27 05/15/2015 1455   ALT 22 05/15/2015 1455   ALKPHOS 71 05/15/2015 1455   BILITOT 0.5 05/15/2015 1455   GFRNONAA >60 05/19/2015 0039   GFRAA >60 05/19/2015 0039    GFR Estimated Creatinine Clearance: 131.4 mL/min (by C-G formula based on Cr of 0.87). No results for input(s): LIPASE, AMYLASE in the last 72 hours. No results for input(s): CKTOTAL, CKMB, CKMBINDEX, TROPONINI in the last 72 hours. Invalid input(s): POCBNP No results for input(s): DDIMER in the last 72 hours. No results for input(s): HGBA1C in the last 72 hours.  Recent Labs  05/18/15 1810  TRIG 138   No results for input(s): TSH, T4TOTAL, T3FREE, THYROIDAB in the last 72 hours.  Invalid input(s): FREET3  Recent Labs  05/17/15 1448 05/19/15 1145  VITAMINB12  --  287  FOLATE  --  14.7  FERRITIN  --  78  TIBC  --  368  IRON  --  27*  RETICCTPCT 0.7 0.8   Coags: No results for input(s): INR in the last 72 hours.  Invalid input(s): PT Microbiology: Recent Results (from the past 240 hour(s))  Urine culture     Status: None   Collection Time: 05/15/15  2:13 PM  Result Value Ref Range Status   Specimen Description URINE, RANDOM  Final   Special  Requests ADDED 2214  Final   Culture NO GROWTH 2 DAYS  Final   Report Status 05/17/2015 FINAL  Final  Culture, blood (routine x 2)     Status: None (Preliminary result)   Collection Time: 05/15/15  2:45 PM  Result Value Ref Range Status   Specimen Description BLOOD RIGHT HAND  Final   Special Requests BOTTLES DRAWN AEROBIC AND ANAEROBIC 5CC  Final   Culture NO GROWTH 4 DAYS  Final  Report Status PENDING  Incomplete  Culture, blood (routine x 2)     Status: None (Preliminary result)   Collection Time: 05/15/15  2:50 PM  Result Value Ref Range Status   Specimen Description BLOOD LEFT HAND  Final   Special Requests BOTTLES DRAWN AEROBIC AND ANAEROBIC 5CC  Final   Culture NO GROWTH 4 DAYS  Final   Report Status PENDING  Incomplete  MRSA PCR Screening     Status: None   Collection Time: 05/15/15  4:45 PM  Result Value Ref Range Status   MRSA by PCR NEGATIVE NEGATIVE Final    Comment:        The GeneXpert MRSA Assay (FDA approved for NASAL specimens only), is one component of a comprehensive MRSA colonization surveillance program. It is not intended to diagnose MRSA infection nor to guide or monitor treatment for MRSA infections.   C difficile quick scan w PCR reflex     Status: Abnormal   Collection Time: 05/19/15  8:48 AM  Result Value Ref Range Status   C Diff antigen POSITIVE (A) NEGATIVE Final   C Diff toxin NEGATIVE NEGATIVE Final   C Diff interpretation   Final    C. difficile present, but toxin not detected. This indicates colonization. In most cases, this does not require treatment. If patient has signs and symptoms consistent with colitis, consider treatment.     BRIEF HOSPITAL COURSE:   Active Problems: Drug overdose with Valium: Presented with respiratory failure, requiring intubation. Initially thought to have heroin overdose, however per history patient unintentionally overdosed on Valium. Patient claims that he uses benzodiazepine's for panic attacks. He is  currently awake and alert. He denies any suicidal or homicidal ideations and claims that this was accidental and he himself called 911. His father is at bedside, per patient, his brother has already destroyed/removed remaining benzodiazepine from his house. He is requesting discharge, he is being discharged at his request. He has been counseled extensively regarding importance of avoiding excessive benzodiazepine use, we will defer to PCP refer to psychiatry for better anxiety control.  Acute hypercarbic respiratory failure: Secondary to drug overdose. Required mechanical ventilation on admission. Respiratory failure has resolved, patient currently on room air. Doing well and requesting discharge.  Suspected aspiration pneumonia: Rapidly improved, has already completed a 5 day course-therefore will not be discharged on any further antibiotics  C diff colonization:Antigen + but toxin negative - nonetheless the patient is presently on broad-spectrum antibiotic and is having diarrhea therefore felt appropriate to treat him with Flagyl for a planned 10 day course  History of esophagitis with prior GI bleed requiring transfusion - history esophageal stricture status post dilation 6: During this admission patient complained of dysphagia-GI was consulted, patient underwent EGD and subsequent dilatation. Recommendations are to continue PPI twice daily, I've asked patient to follow-up with his primary gastroenterologist on discharge   Microcytic Anemia of unclear etiology:Hgb nadir of 7.9 this hospitalization - currently climbing - suspect chronic blood loss from ulcerative esophagitis leading to Fe deficiency and heme + stool - Fe studies confirm Fe deficiency -given  IV Fe-hematoma stable at 9.7 on discharge. Defer further workup/treatment to the outpatient setting   TODAY-DAY OF DISCHARGE:  Subjective:   Ottavio Norem today has no headache,no chest abdominal pain,no new weakness tingling or numbness, feels  much better wants to go home today.   Objective:   Blood pressure 133/81, pulse 74, temperature 98.1 F (36.7 C), temperature source Oral, resp. rate 18, height 6' (  1.829 m), weight 76.2 kg (167 lb 15.9 oz), SpO2 98 %. No intake or output data in the 24 hours ending 05/20/15 1418 Filed Weights   05/18/15 1839 05/19/15 0535 05/20/15 0516  Weight: 78.8 kg (173 lb 11.6 oz) 78.8 kg (173 lb 11.6 oz) 76.2 kg (167 lb 15.9 oz)    Exam Awake Alert, Oriented *3, No new F.N deficits, Normal affect [place].AT,PERRAL Supple Neck,No JVD, No cervical lymphadenopathy appriciated.  Symmetrical Chest wall movement, Good air movement bilaterally, CTAB RRR,No Gallops,Rubs or new Murmurs, No Parasternal Heave +ve B.Sounds, Abd Soft, Non tender, No organomegaly appriciated, No rebound -guarding or rigidity. No Cyanosis, Clubbing or edema, No new Rash or bruise  DISCHARGE CONDITION: Stable  DISPOSITION: Home  DISCHARGE INSTRUCTIONS:    Activity:  As tolerated   Diet recommendation: Regular Diet  Discharge Instructions    Call MD for:  persistant nausea and vomiting    Complete by:  As directed      Diet general    Complete by:  As directed      Increase activity slowly    Complete by:  As directed            Follow-up Information    Follow up with [facility] at [place] On 05/24/2015.   Specialty:  Family Medicine   Why:  Monday, August 8th, 2016 @ 1:15pm   Contact information:   [place] [place] [phone]      Follow up with Pricilla Riffle. Fuller Plan, MD. Schedule an appointment as soon as possible for a visit in 2 weeks.   Specialty:  Gastroenterology   Contact information:   [address] [address] [phone]         Total Time spent on discharge equals  45 minutes.  SignedOren Binet 05/20/2015 2:18 PM

## 2015-05-20 NOTE — Transfer of Care (Signed)
Immediate Anesthesia Transfer of Care Note  Patient: Eric Hood  Procedure(s) Performed: Procedure(s): ESOPHAGOGASTRODUODENOSCOPY (EGD) (N/A) BALLOON DILATION (N/A)  Patient Location: Endoscopy Unit  Anesthesia Type:MAC  Level of Consciousness: sedated and responds to stimulation  Airway & Oxygen Therapy: Patient Spontanous Breathing and Patient connected to nasal cannula oxygen  Post-op Assessment: Report given to RN and Post -op Vital signs reviewed and stable  Post vital signs: Reviewed and stable  Last Vitals:  Filed Vitals:   05/20/15 1051  BP:   Pulse:   Temp: 37.1 C  Resp:     Complications: No apparent anesthesia complications

## 2015-05-20 NOTE — Consult Note (Signed)
[facility] Consult: 8:18 AM 05/20/2015  LOS: 5 days    Referring Provider: Dr Sloan Leiter  Primary Care Physician:  Eulas Post, MD Primary Gastroenterologist:  Dr. Fuller Plan     Reason for Consultation:  Dysphagia.    HPI: Eric Hood is a 34 y.o. male.  Issues with chronic pain. Pt with hx grade D ulcerative esophagitis.  Numerous previous EGDs and baloon dilatations of mid esophageal strictures dating to 03/2014, 08/2014, 09/2014, 11/2014.  Latest EGD was 01/18/15 at which point he had persistent severe esophagitis, strictures, HH and push through resolution of food impaction.  Biopsies have not shown eosinophilic esophagitis or dysplasia/metaplasia.  On BID PPI  11/2014 Colonoscopy for IDA, FOBT +: normal study.   Admitted 7/30 after valiium overdose. Apparently he had a bottle of Valium containing 80 pills, he took one, blacked out and when he woke up he had only 10 Valium in the bottle. Required vent support for aspiration PNA.  Bump in Troponins to 0.11.  Post extubation pt providing hx on recurrent dysphagia. He had gotten to the point where he felt he needed repeat esophageal dilatation, having trouble with solid foods. In the hospital oral metronidazole (initiated for possible C. difficile) has led to acute dysphagia. After swallowing the pill yesterday he noticed it was sticking, this morning's pill led to him having difficulty swallowing liquids afterwards. As to the question of C. difficile, patient normally has 2 formed stools daily. Since his stay in the ICU he is still just having 2 bowel movements a day but they are now loose.   Past Medical History  Diagnosis Date  . Back pain   . Reflux   . Esophageal stricture 03/2014  . Hiatal hernia   . Ulcerative esophagitis 03/2014    severe  . Anxiety     . GERD (gastroesophageal reflux disease)   . Anemia 10/23/2014    Past Surgical History  Procedure Laterality Date  . Hand surgery Right   . Esophageal dilation      On 3 occasions: 03/2014, 08/2014, 09/2014  . Esophagogastroduodenoscopy (egd) with propofol N/A 01/18/2015    Procedure: ESOPHAGOGASTRODUODENOSCOPY (EGD) WITH PROPOFOL;  Surgeon: Lafayette Dragon, MD;  Location: [facility];  Service: Endoscopy;  Laterality: N/A;    Prior to Admission medications   Medication Sig Start Date End Date Taking? Authorizing Provider  baclofen (LIORESAL) 10 MG tablet Take 1 tablet (10 mg total) by mouth 3 (three) times daily. 05/12/15  Yes Lyndal Pulley, DO  pantoprazole (PROTONIX) 40 MG tablet TAKE 1 TABLET (40 MG TOTAL) BY MOUTH 2 (TWO) TIMES DAILY. 02/10/15  Yes Lyndal Pulley, DO  ranitidine (ZANTAC) 300 MG tablet Take 1 tablet (300 mg total) by mouth at bedtime. 02/23/15  Yes Ladene Artist, MD    Scheduled Meds: . antiseptic oral rinse  7 mL Mouth Rinse QID  . baclofen  10 mg Oral TID  . chlorhexidine  15 mL Mouth Rinse BID  . cyanocobalamin  1,000 mcg Subcutaneous Q2000  . metroNIDAZOLE  500 mg Oral 3  times per day  . pantoprazole  40 mg Oral BID AC   Infusions:   PRN Meds: acetaminophen, acetaminophen, albuterol, diphenhydrAMINE, ondansetron (ZOFRAN) IV   Allergies as of 05/15/2015  . (No Known Allergies)    Family History  Problem Relation Age of Onset  . Hypertension Mother   . Hypertension Father   . Colon cancer Neg Hx   . Esophageal cancer Neg Hx   . Rectal cancer Neg Hx   . Stomach cancer Neg Hx     History   Social History  . Marital Status: Single    Spouse Name: N/A  . Number of Children: N/A  . Years of Education: N/A   Occupational History  . Not on file.   Social History Main Topics  . Smoking status: Never Smoker   . Smokeless tobacco: Never Used  . Alcohol Use: No  . Drug Use: No     Comment: opiate abuse  . Sexual Activity: No   Other  Topics Concern  . Not on file   Social History Narrative    REVIEW OF SYSTEMS: Constitutional:  No malaise, no weakness.NT:  No nose bleeds Pulm:  No cough, no shortness of breath CV:  No palpitations, no LE edema.  no chest pain GU:  No hematuria, no frequency GI:  Per history of present illness  Heme:   Patient has not been taking oral iron for quite some time though it was prescribed in the past.  Transfusions:   no previous transfusions. Neuro:  No headaches.   chronic issues with insomnia which is why he has the prescription for Valium. Some lower extremity numbness at times. Chronic back pain.  Derm:  No itching, no rash or sores.  Endocrine:  No sweats or chills.  No polyuria or dysuria Immunization:  knots.. Travel:  None beyond local counties in last few months.    PHYSICAL EXAM: Vital signs in last 24 hours: Filed Vitals:   05/20/15 0516  BP: 126/77  Pulse: 78  Temp: 98.2 F (36.8 C)  Resp: 18   Wt Readings from Last 3 Encounters:  05/20/15 167 lb 15.9 oz (76.2 kg)  05/12/15 182 lb (82.555 kg)  03/16/15 171 lb (77.565 kg)    General:  Slightly pale but overall well appearing WF. Comfortable Head:  No swelling, no signs of trauma, no asymmetry.  Eyes:  No scleral icterus, no conjunctival pallor. Ears:  Not HOH  Nose:  No discharge or congestion Mouth:  Clear, moist. Good dentition. Neck:  No masses, no thyromegaly. Lungs:  Clear to auscultation and percussion bilaterally. No cough, no dyspnea. Heart: RRR. No MRG. S1, S2 audible. Abdomen:  Soft, Nd, NT.  No mass, bruits or HSM.  Marland Kitchen   Rectal:  Deferred   Musc/Skeltl: No joint swelling, erythema or contracture deformity. Extremities:No CCE. Neurologic:  Oriented 3. Alert. No tremor. Moves all 4 limbs. Limb strength not tested. Skin:  no telangiectasia, sores, rashes Nodes:no cervical or inguinal adenopathy.    Psych:  cooperative, affect normal.   Intake/Output from previous day:   Intake/Output this  shift:    LAB RESULTS:  Recent Labs  05/18/15 1250 05/19/15 0039 05/20/15 0554  WBC 8.2 8.0 8.2  HGB 8.3* 9.5* 9.7*  HCT 27.3* 30.9* 31.2*  PLT 232 295 303   BMET Lab Results  Component Value Date   NA 144 05/19/2015   NA 145 05/18/2015   NA 143 05/17/2015   K 3.6 05/19/2015   K 3.4*  05/18/2015   K 3.3* 05/17/2015   CL 109 05/19/2015   CL 109 05/18/2015   CL 111 05/17/2015   CO2 26 05/19/2015   CO2 27 05/18/2015   CO2 28 05/17/2015   GLUCOSE 96 05/19/2015   GLUCOSE 101* 05/18/2015   GLUCOSE 129* 05/17/2015   BUN 9 05/19/2015   BUN 7 05/18/2015   BUN 7 05/17/2015   CREATININE 0.87 05/19/2015   CREATININE 0.93 05/18/2015   CREATININE 0.99 05/17/2015   CALCIUM 9.2 05/19/2015   CALCIUM 8.4* 05/18/2015   CALCIUM 8.3* 05/17/2015   LFT No results for input(s): PROT, ALBUMIN, AST, ALT, ALKPHOS, BILITOT, BILIDIR, IBILI in the last 72 hours. PT/INR Lab Results  Component Value Date   INR 1.06 05/15/2015   Lipase     Component Value Date/Time   LIPASE 15* 05/15/2015 1455    Drugs of Abuse     Component Value Date/Time   LABOPIA POSITIVE* 05/15/2015 1413   COCAINSCRNUR NONE DETECTED 05/15/2015 1413   LABBENZ POSITIVE* 05/15/2015 1413   AMPHETMU NONE DETECTED 05/15/2015 1413   THCU NONE DETECTED 05/15/2015 1413   LABBARB NONE DETECTED 05/15/2015 1413     Ref. Range 05/19/2015 08:48  C Diff interpretation Unknown C. difficile pres...  C Diff toxin Latest Ref Range: NEGATIVE  NEGATIVE  C Diff antigen Latest Ref Range: NEGATIVE  POSITIVE (A)    RADIOLOGY STUDIES: Dg Chest Port 1 View  05/18/2015   CLINICAL DATA:  Wheezing.  EXAM: PORTABLE CHEST - 1 VIEW  COMPARISON:  05/17/2015  FINDINGS: Endotracheal tube has been removed. Left central line tip overlies the superior vena cava, unchanged. There has been significant improvement in aeration of the right lung base. Possible developing infiltrate in the left midlung zone. There is perihilar density bilaterally  which is minimal. No definite pulmonary edema.  IMPRESSION: 1. Significant improvement in aeration of the right lung base. 2. Suspect developing infiltrate in the left mid lung zone. 3. Status postextubation.   Electronically Signed   By: Nolon Nations M.D.   On: 05/18/2015 12:35    ENDOSCOPIC STUDIES: Per HPI  IMPRESSION:   *  Dysphagia, recurrent.  Hx of same and EGDs dating back to 03/2014 showing severe esophagitis, esophageal strictures requiring repeat dilatations, HH.  Food impaction in 01/2015.    *  Benzo overdose.  Chronic pain.   *  Acute on chronic, microcytic anemia.  Hx IDA.  Received infusion of ferriheme today.  *  C diff Ag+, toxin negative. Normally this reflects just colonization. However patient's stools loose but not copious. M.D. Initiated oral metronidazole yesterday.  not convinced this is active C. difficile. Loose stools may have merely been a side effect of Unasyn, discontinued yesterday.    PLAN:     *  EGD and likely dilatation of esophagus today    Azucena Freed  05/20/2015, 8:18 AM Pager: [phone]     ________________________________________________________________________  Velora Heckler GI MD note:  I personally examined the patient, reviewed the data and agree with the assessment and plan described above.  Planning on EGD, dilation today.   Owens Loffler, MD [facility] Pager [phone]

## 2015-05-21 ENCOUNTER — Telehealth: Payer: Self-pay

## 2015-05-21 ENCOUNTER — Encounter (HOSPITAL_COMMUNITY): Payer: Self-pay | Admitting: Gastroenterology

## 2015-05-21 NOTE — Telephone Encounter (Signed)
Patient is notified that EGD is scheduled for 07/27/15 10:00.  He will come for a pre-visit for 06/23/15 2:00

## 2015-05-21 NOTE — Telephone Encounter (Signed)
-----   Message from Ladene Artist, MD sent at 05/20/2015 11:55 AM EDT ----- Butch Penny ask Caroleann Casler to schedule. Any ideas on why he has such frequent recurrences? He doesn't always follow up when he should. I wonder if he is compliant with PPI BID? Eric Hood    ----- Message -----    From: Milus Banister, MD    Sent: 05/20/2015  11:40 AM      To: Ladene Artist, MD  Eric Hood See EGD below.  I think he's going to need repeat dilation in next few weeks, didn't feel comfortable going larger than 81mm today and usually he's getting dilation to 75mm.    Thanks DJ   ENDOSCOPIC IMPRESSION: The previously noted mid esophagus stricture was located. The proximal edge was about 30cm from the incisors. I could not advance 46mm diameter adult gastroscope through the stricture. Lumen estimated to be 6-87mm. Using a CRE TTS wire guided balloon I dilated the stricture up to 18mm. Following dilation I was able to advance the gastroscope into the stomach and noted 3cm hiatal hernia. The stricture was about 4-5cm long and following dilation there wer several superficial mucosal tears with self limited oozing. The examination was otherwise normal  RECOMMENDATIONS: Continue twice daily PPI (best taken 20-30 min prior to breakfast and dinner meals), chew your food well, eat slowly and take small bites. I will forward this report to Dr. Fuller Plan so that he can schedule repeat EGD/dilation in the next few weeks.

## 2015-05-24 ENCOUNTER — Ambulatory Visit (INDEPENDENT_AMBULATORY_CARE_PROVIDER_SITE_OTHER): Payer: Self-pay | Admitting: Family Medicine

## 2015-05-24 ENCOUNTER — Encounter: Payer: Self-pay | Admitting: Family Medicine

## 2015-05-24 VITALS — BP 120/70 | HR 87 | Temp 98.4°F | Wt 172.0 lb

## 2015-05-24 DIAGNOSIS — F419 Anxiety disorder, unspecified: Secondary | ICD-10-CM

## 2015-05-24 DIAGNOSIS — J690 Pneumonitis due to inhalation of food and vomit: Secondary | ICD-10-CM

## 2015-05-24 DIAGNOSIS — J9601 Acute respiratory failure with hypoxia: Secondary | ICD-10-CM

## 2015-05-24 DIAGNOSIS — K222 Esophageal obstruction: Secondary | ICD-10-CM

## 2015-05-24 DIAGNOSIS — D509 Iron deficiency anemia, unspecified: Secondary | ICD-10-CM

## 2015-05-24 NOTE — Progress Notes (Signed)
Pre visit review using our clinic review tool, if applicable. No additional management support is needed unless otherwise documented below in the visit note. 

## 2015-05-24 NOTE — Patient Instructions (Signed)
Iron-Rich Diet An iron-rich diet contains foods that are good sources of iron. Iron is an important mineral that helps your body produce hemoglobin. Hemoglobin is a protein in red blood cells that carries oxygen to the body's tissues. Sometimes, the iron level in your blood can be low. This may be caused by:  A lack of iron in your diet.  Blood loss.  Times of growth, such as during pregnancy or during a child's growth and development. Low levels of iron can cause a decrease in the number of red blood cells. This can result in iron deficiency anemia. Iron deficiency anemia symptoms include:  Tiredness.  Weakness.  Irritability.  Increased chance of infection. Here are some recommendations for daily iron intake:  Males older than 33 years of age need 8 mg of iron per day.  Women ages 50 to 14 need 18 mg of iron per day.  Pregnant women need 27 mg of iron per day, and women who are over 76 years of age and breastfeeding need 9 mg of iron per day.  Women over the age of 16 need 8 mg of iron per day. SOURCES OF IRON There are 2 types of iron that are found in food: heme iron and nonheme iron. Heme iron is absorbed by the body better than nonheme iron. Heme iron is found in meat, poultry, and fish. Nonheme iron is found in grains, beans, and vegetables. Heme Iron Sources Food / Iron (mg)  Chicken liver, 3 oz (85 g)/ 10 mg  Beef liver, 3 oz (85 g)/ 5.5 mg  Oysters, 3 oz (85 g)/ 8 mg  Beef, 3 oz (85 g)/ 2 to 3 mg  Shrimp, 3 oz (85 g)/ 2.8 mg  [place], 3 oz (85 g)/ 2 mg  Chicken, 3 oz (85 g) / 1 mg  Fish (tuna, halibut), 3 oz (85 g)/ 1 mg  Pork, 3 oz (85 g)/ 0.9 mg Nonheme Iron Sources Food / Iron (mg)  Ready-to-eat breakfast cereal, iron-fortified / 3.9 to 7 mg  Tofu,  cup / 3.4 mg  Kidney beans,  cup / 2.6 mg  Baked potato with skin / 2.7 mg  Asparagus,  cup / 2.2 mg  Avocado / 2 mg  Dried peaches,  cup / 1.6 mg  Raisins,  cup / 1.5 mg  Soy milk, 1 cup  / 1.5 mg  Whole-wheat bread, 1 slice / 1.2 mg  Spinach, 1 cup / 0.8 mg  Broccoli,  cup / 0.6 mg IRON ABSORPTION Certain foods can decrease the body's absorption of iron. Try to avoid these foods and beverages while eating meals with iron-containing foods:  Coffee.  Tea.  Fiber.  Soy. Foods containing vitamin C can help increase the amount of iron your body absorbs from iron sources, especially from nonheme sources. Eat foods with vitamin C along with iron-containing foods to increase your iron absorption. Foods that are high in vitamin C include many fruits and vegetables. Some good sources are:  Fresh orange juice.  Oranges.  Strawberries.  Mangoes.  Grapefruit.  Red bell peppers.  Green bell peppers.  Broccoli.  Potatoes with skin.  Tomato juice. Document Released: 05/16/2005 Document Revised: 12/25/2011 Document Reviewed: 03/23/2011 [facility] Patient Information 2015 [place], [place]. This information is not intended to replace advice given to you by your health care provider. Make sure you discuss any questions you have with your health care provider.  Consider Benadryl as needed for insomnia.   Consider Over the counter B12 1000 mg  once daily.

## 2015-05-24 NOTE — Progress Notes (Signed)
Subjective:    Patient ID: Eric Hood, male    DOB: 1981/03/20, 34 y.o.   MRN: [id number]  HPI Patient seen for hospital follow-up. He is admitted on July 30 after apparent respiratory arrest after taking diazepam. He has history of chronic back pain and GERD with esophageal stricture. Initially, for some reason, they thought he had taken heroin overdose but he apparently had taken some diazepam following a panic attack. He states he took at least 4 and possibly more 5 mg diazepam which are from old prescription. We have not prescribed any recent benzodiazepine's. He denied any suicidal intent. Chest x-ray showed possible right lower lobe infiltrate. CT angiography no pulmonary embolus.  Patient had drug overdose with Valium. He apparently himself called 911. Family states that they have destroyed any remaining benzodiazepine's from the house. He had acute hypercarbic respiratory failure related to drug overdose. He did require mechanical ventilation. He was quickly extubated. He completed 5 day course of antibiotic's with no further fever or shortness of breath  Patient had C. difficile colonization but toxin negative. He been treated with antibiotic's and was having diarrhea and therefore treated with full course of Flagyl. Diarrhea resolved at this time.  Long history of esophagitis and esophageal stricture. He had dilatation during this admission. Microcytic anemia with hemoglobin 7.9 on admission and discharge 9.7. He has not had any lightheadedness or dizziness. No abdominal pain. Still has slow esophageal transit at times.  History of some chronic anxiety. We tried SSRI medications in the past but he states that they did not seem to help his anxiety. No history of agitation or bipolar  Past Medical History  Diagnosis Date  . Back pain   . Reflux   . Esophageal stricture 03/2014  . Hiatal hernia   . Ulcerative esophagitis 03/2014    severe  . Anxiety   . GERD (gastroesophageal  reflux disease)   . Anemia 10/23/2014   Past Surgical History  Procedure Laterality Date  . Hand surgery Right   . Esophageal dilation      On 3 occasions: 03/2014, 08/2014, 09/2014  . Esophagogastroduodenoscopy (egd) with propofol N/A 01/18/2015    Procedure: ESOPHAGOGASTRODUODENOSCOPY (EGD) WITH PROPOFOL;  Surgeon: Lafayette Dragon, MD;  Location: [facility];  Service: Endoscopy;  Laterality: N/A;  . Esophagogastroduodenoscopy N/A 05/20/2015    Procedure: ESOPHAGOGASTRODUODENOSCOPY (EGD);  Surgeon: Milus Banister, MD;  Location: [place];  Service: Endoscopy;  Laterality: N/A;  . Balloon dilation N/A 05/20/2015    Procedure: BALLOON DILATION;  Surgeon: Milus Banister, MD;  Location: [place];  Service: Endoscopy;  Laterality: N/A;    reports that he has never smoked. He has never used smokeless tobacco. He reports that he does not drink alcohol or use illicit drugs. family history includes Hypertension in his father and mother. There is no history of Colon cancer, Esophageal cancer, Rectal cancer, or Stomach cancer. No Known Allergies    Review of Systems  Constitutional: Negative for fever and chills.  HENT: Positive for trouble swallowing.   Respiratory: Negative for shortness of breath.   Cardiovascular: Negative for chest pain.  Gastrointestinal: Negative for nausea, vomiting, abdominal pain and diarrhea.  Genitourinary: Negative for dysuria.  Neurological: Negative for dizziness.  Psychiatric/Behavioral: Positive for sleep disturbance. Negative for suicidal ideas, confusion and agitation. The patient is nervous/anxious.        Objective:   Physical Exam  Constitutional: He appears well-developed and well-nourished.  HENT:  Mouth/Throat: Oropharynx is clear and  moist.  Neck: Neck supple. No thyromegaly present.  Cardiovascular: Normal rate and regular rhythm.  Exam reveals no gallop.   No murmur heard. Pulmonary/Chest: Effort normal and breath sounds normal. No  respiratory distress. He has no wheezes. He has no rales.  Abdominal: Soft. Bowel sounds are normal. He exhibits no distension and no mass. There is no tenderness. There is no rebound and no guarding.  Musculoskeletal: He exhibits no edema.  Lymphadenopathy:    He has no cervical adenopathy.          Assessment & Plan:  #1 respiratory arrest following drug overdose. Avoid any use of benzodiazepine. #2 recent suspected aspiration pneumonia clinically improved following broad-spectrum antibiotic's #3 C. difficile colonization but toxin negative. Diarrhea resolved on Flagyl  # 4 microcytic anemia. Repeat CBC. Patient declines labs today asking that we delay these for one week because some soreness in both arms from some recent labs # 5 history of esophagitis with esophageal stricture. Recent dilatation as above. Continue PPI #6 low normal B12. Likely related to chronic PPI use. He'll try oral supplement.  Consider repeat level in 6 months. #8 chronic anxiety. We recommended psychiatry consult and we will set this up and he agrees

## 2015-06-01 ENCOUNTER — Other Ambulatory Visit: Payer: Self-pay

## 2015-06-23 ENCOUNTER — Ambulatory Visit (AMBULATORY_SURGERY_CENTER): Payer: Self-pay | Admitting: *Deleted

## 2015-06-23 ENCOUNTER — Ambulatory Visit: Payer: Self-pay | Admitting: Family Medicine

## 2015-06-23 VITALS — Ht 72.0 in | Wt 172.0 lb

## 2015-06-23 DIAGNOSIS — K222 Esophageal obstruction: Secondary | ICD-10-CM

## 2015-06-23 NOTE — Progress Notes (Signed)
Patient denies any allergies to eggs or soy. Patient denies any problems with anesthesia/sedation. Patient denies any oxygen use at home and does not take any diet/weight loss medications. Patient declined EMMI information today.  

## 2015-07-27 ENCOUNTER — Encounter: Payer: Self-pay | Admitting: Gastroenterology

## 2015-07-27 ENCOUNTER — Ambulatory Visit (AMBULATORY_SURGERY_CENTER): Payer: PRIVATE HEALTH INSURANCE | Admitting: Gastroenterology

## 2015-07-27 VITALS — BP 118/68 | HR 82 | Temp 98.2°F | Resp 17 | Ht 72.0 in | Wt 172.0 lb

## 2015-07-27 DIAGNOSIS — R1319 Other dysphagia: Secondary | ICD-10-CM

## 2015-07-27 DIAGNOSIS — R1314 Dysphagia, pharyngoesophageal phase: Secondary | ICD-10-CM | POA: Diagnosis not present

## 2015-07-27 DIAGNOSIS — K222 Esophageal obstruction: Secondary | ICD-10-CM

## 2015-07-27 DIAGNOSIS — R1310 Dysphagia, unspecified: Secondary | ICD-10-CM

## 2015-07-27 MED ORDER — SODIUM CHLORIDE 0.9 % IV SOLN: 500.0000 mL | INTRAVENOUS | Status: DC

## 2015-07-27 NOTE — Progress Notes (Signed)
Report to PACU, RN, vss, BBS= Clear.  

## 2015-07-27 NOTE — Op Note (Signed)
[place]  [address] [place], [id number]   ENDOSCOPY PROCEDURE REPORT  PATIENT: Eric Hood, Eric Hood  MR#: [id number] BIRTHDATE: 10/09/1981 , 34  yrs. old GENDER: male ENDOSCOPIST: Ladene Artist, MD, [facility] PROCEDURE DATE:  07/27/2015 PROCEDURE:  EGD w/ wire guided (savary) dilation ASA CLASS:     Class II INDICATIONS:  dysphagia and esophageal stricture for repeat dilation. MEDICATIONS: Monitored anesthesia care and Propofol 200 mg IV TOPICAL ANESTHETIC: none DESCRIPTION OF PROCEDURE: After the risks benefits and alternatives of the procedure were thoroughly explained, informed consent was obtained.  The [id number] endoscope was introduced through the mouth and advanced to the second portion of the duodenum , Without limitations.  The instrument was slowly withdrawn as the mucosa was fully examined.    ESOPHAGUS: There was a fibrotic, benign appearing and friable stricture in the mid esophagus measuring about 3 cm.  The stricture was traversable.  The stricture was dilated using a 33mm, 19mm and 69mm  savary dilators over guidewire. The was minimal resistance and heme with each dilator.   There was erosive esophagitis noted from the mid to distal esophagus. STOMACH: The mucosa of the stomach appeared normal. DUODENUM: The duodenal mucosa showed no abnormalities in the bulb and 2nd part of the duodenum.  Retroflexed views revealed a 4-5 cm hiatal hernia.   The scope was then withdrawn from the patient and the procedure completed.  COMPLICATIONS: There were no immediate complications.  ENDOSCOPIC IMPRESSION: 1.   Stricture in the mid esophagus; dilated using savary dilators over guidewire 2.   Erosive esophagitis 3.   4-5 cm hiatal hernia  RECOMMENDATIONS: 1.  Anti-reflux regimen 2.  Post dilation instructions 3.  Continue PPI bid, ac 4.  Office visit in 2-3 months and call if dysphagia recurs before visit  eSigned:  Ladene Artist, MD,  [facility] 07/27/2015 10:49 AM

## 2015-07-27 NOTE — Patient Instructions (Signed)
YOU HAD AN ENDOSCOPIC PROCEDURE TODAY AT THE [facility]:   Refer to the procedure report that was given to you for any specific questions about what was found during the examination.  If the procedure report does not answer your questions, please call your gastroenterologist to clarify.  If you requested that your care partner not be given the details of your procedure findings, then the procedure report has been included in a sealed envelope for you to review at your convenience later.  YOU SHOULD EXPECT: Some feelings of bloating in the abdomen. Passage of more gas than usual.  Walking can help get rid of the air that was put into your GI tract during the procedure and reduce the bloating. If you had a lower endoscopy (such as a colonoscopy or flexible sigmoidoscopy) you may notice spotting of blood in your stool or on the toilet paper. If you underwent a bowel prep for your procedure, you may not have a normal bowel movement for a few days.  Please Note:  You might notice some irritation and congestion in your nose or some drainage.  This is from the oxygen used during your procedure.  There is no need for concern and it should clear up in a day or so.  SYMPTOMS TO REPORT IMMEDIATELY:    Following upper endoscopy (EGD)  Vomiting of blood or coffee ground material  New chest pain or pain under the shoulder blades  Painful or persistently difficult swallowing  New shortness of breath  Fever of 100F or higher  Black, tarry-looking stools  For urgent or emergent issues, a gastroenterologist can be reached at any hour by calling [phone].       DIET: Follow Dilation diet  ACTIVITY:  You should plan to take it easy for the rest of today and you should NOT DRIVE or use heavy machinery until tomorrow (because of the sedation medicines used during the test).    FOLLOW UP: Our staff will call the number listed on your records the next business day following your procedure  to check on you and address any questions or concerns that you may have regarding the information given to you following your procedure. If we do not reach you, we will leave a message.  However, if you are feeling well and you are not experiencing any problems, there is no need to return our call.  We will assume that you have returned to your regular daily activities without incident.  If any biopsies were taken you will be contacted by phone or by letter within the next 1-3 weeks.  Please call us at [phone] if you have not heard about the biopsies in 3 weeks.    SIGNATURES/CONFIDENTIALITY: You and/or your care partner have signed paperwork which will be entered into your electronic medical record.  These signatures attest to the fact that that the information above on your After Visit Summary has been reviewed and is understood.  Full responsibility of the confidentiality of this discharge information lies with you and/or your care-partner.   

## 2015-07-27 NOTE — Progress Notes (Signed)
Called to room to assist during endoscopic procedure.  Patient ID and intended procedure confirmed with present staff. Received instructions for my participation in the procedure from the performing physician.  

## 2015-07-28 ENCOUNTER — Telehealth: Payer: Self-pay

## 2015-07-28 NOTE — Telephone Encounter (Signed)
  Follow up Call-  Call back number 07/27/2015 11/20/2014 10/02/2014 09/08/2014 04/10/2014  Post procedure Call Back phone  # [phone]  Permission to leave phone message Yes Yes Yes Yes Yes     Patient questions:  Do you have a fever, pain , or abdominal swelling? No. Pain Score  0 *  Have you tolerated food without any problems? Yes.    Have you been able to return to your normal activities? Yes.    Do you have any questions about your discharge instructions: Diet   No. Medications  No. Follow up visit  No.  Do you have questions or concerns about your Care? No.  Actions: * If pain score is 4 or above: No action needed, pain <4.  Pt had no problems last night. maw

## 2015-08-06 ENCOUNTER — Other Ambulatory Visit (INDEPENDENT_AMBULATORY_CARE_PROVIDER_SITE_OTHER): Payer: PRIVATE HEALTH INSURANCE

## 2015-08-06 DIAGNOSIS — I10 Essential (primary) hypertension: Secondary | ICD-10-CM | POA: Diagnosis not present

## 2015-08-06 DIAGNOSIS — D649 Anemia, unspecified: Secondary | ICD-10-CM

## 2015-08-06 LAB — CBC WITH DIFFERENTIAL/PLATELET
BASOS ABS: 0 10*3/uL (ref 0.0–0.1)
Basophils Relative: 0.4 % (ref 0.0–3.0)
Eosinophils Absolute: 0.1 10*3/uL (ref 0.0–0.7)
Eosinophils Relative: 1.6 % (ref 0.0–5.0)
HEMATOCRIT: 37.2 % — AB (ref 39.0–52.0)
Hemoglobin: 12.1 g/dL — ABNORMAL LOW (ref 13.0–17.0)
LYMPHS PCT: 14.2 % (ref 12.0–46.0)
Lymphs Abs: 1.1 10*3/uL (ref 0.7–4.0)
MCHC: 32.4 g/dL (ref 30.0–36.0)
MCV: 79.8 fl (ref 78.0–100.0)
MONOS PCT: 4 % (ref 3.0–12.0)
Monocytes Absolute: 0.3 10*3/uL (ref 0.1–1.0)
NEUTROS ABS: 5.9 10*3/uL (ref 1.4–7.7)
Neutrophils Relative %: 79.8 % — ABNORMAL HIGH (ref 43.0–77.0)
Platelets: 450 10*3/uL — ABNORMAL HIGH (ref 150.0–400.0)
RBC: 4.66 Mil/uL (ref 4.22–5.81)
RDW: 21.8 % — ABNORMAL HIGH (ref 11.5–15.5)
WBC: 7.4 10*3/uL (ref 4.0–10.5)

## 2015-08-06 LAB — BASIC METABOLIC PANEL
BUN: 12 mg/dL (ref 6–23)
CO2: 29 mEq/L (ref 19–32)
Calcium: 9.3 mg/dL (ref 8.4–10.5)
Chloride: 103 mEq/L (ref 96–112)
Creatinine, Ser: 1.04 mg/dL (ref 0.40–1.50)
GFR: 87.33 mL/min (ref >60.00)
Glucose, Bld: 127 mg/dL — ABNORMAL HIGH (ref 70–99)
Potassium: 4.5 mEq/L (ref 3.5–5.1)
Sodium: 139 mEq/L (ref 135–145)

## 2015-08-09 ENCOUNTER — Ambulatory Visit: Payer: PRIVATE HEALTH INSURANCE | Admitting: Family Medicine

## 2015-08-24 ENCOUNTER — Encounter: Payer: Self-pay | Admitting: Family Medicine

## 2015-08-24 ENCOUNTER — Ambulatory Visit (INDEPENDENT_AMBULATORY_CARE_PROVIDER_SITE_OTHER): Payer: PRIVATE HEALTH INSURANCE | Admitting: Family Medicine

## 2015-08-24 VITALS — BP 122/70 | HR 96 | Ht 72.0 in | Wt 173.0 lb

## 2015-08-24 DIAGNOSIS — M999 Biomechanical lesion, unspecified: Secondary | ICD-10-CM

## 2015-08-24 DIAGNOSIS — M9904 Segmental and somatic dysfunction of sacral region: Secondary | ICD-10-CM

## 2015-08-24 DIAGNOSIS — M9903 Segmental and somatic dysfunction of lumbar region: Secondary | ICD-10-CM

## 2015-08-24 DIAGNOSIS — M9901 Segmental and somatic dysfunction of cervical region: Secondary | ICD-10-CM | POA: Diagnosis not present

## 2015-08-24 DIAGNOSIS — M9902 Segmental and somatic dysfunction of thoracic region: Secondary | ICD-10-CM

## 2015-08-24 DIAGNOSIS — M546 Pain in thoracic spine: Secondary | ICD-10-CM | POA: Diagnosis not present

## 2015-08-24 MED ORDER — BACLOFEN 10 MG PO TABS: 10.0000 mg | ORAL_TABLET | Freq: Three times a day (TID) | ORAL | Status: DC

## 2015-08-24 NOTE — Assessment & Plan Note (Signed)
Decision today to treat with OMT was based on Physical Exam  After verbal consent patient was treated with HVLA, ME, FPR techniques in Cervical, thoracic,  lumbar and sacral areas  Patient tolerated the procedure well with improvement in symptoms  Patient given exercises, stretches and lifestyle modifications  See medications in patient instructions if given  Patient will follow up in 4-6 weeks                          

## 2015-08-24 NOTE — Progress Notes (Signed)
  [name] [address] [place], [place] [address] Phone: [phone] Subjective:     CC: Thoracic back pain, and low back pain.  [id number] Eric Hood is a 34 y.o. male coming in with complaint of back pain. Been 4 months since we've seen patient. Patient did have a scare of a Valium overdose. Patient states continued to have more of the sharp pain on the lower spine. Seem to be on the right side. Describes it as a dull, throbbing aching sensation. Denies any radiation. Sometimes seems to radiate around the side. No radiation down the leg. Still able to do daily activities.   Review of Systems: No headache, visual changes, nausea, vomiting, diarrhea, constipation, dizziness, abdominal pain, skin rash, fevers, chills,swollen lymph nodes, body aches, joint swelling, muscle aches, chest pain, shortness of breath, mood changes.   Objective Blood pressure 122/70, pulse 96, height 6' (1.829 m), weight 173 lb (78.472 kg), SpO2 99 %.  General: looks much better HEENT: Pupils equal, extraocular movements intact  Respiratory: Patient's speak in full sentences and does not appear short of breath  Cardiovascular: No lower extremity edema, non tender, no erythema  Skin: Warm dry intact with no signs of infection or rash on extremities or on axial skeleton.  Abdomen: Soft abdomen non tender  through XII are intact, neurovascularly intact in all extremities with 2+ DTRs and 2+ pulses.  Lymph: No lymphadenopathy of posterior or anterior cervical chain or axillae bilaterally.  Gait normal with good balance and coordination.  MSK:  Non tender with full range of motion and good stability and symmetric strength and tone of shoulders, wrist, elbows, hip, knee and ankles bilaterally.    OMT Physical Exam  Cervical  C2 flexed rotated and side bent left C4 flexed rotated and side bent right  Thoracic T2 extended rotated and side bent right T5 extended rotated  inside that right T8 extended rotated and side bent left  Lumbar L2 flexed rotated and side bent right with tight hip flexor still noted L4 flexed rotated and side bent right  Sacrum Left on left  Illium Neutral    Impression and Recommendations:     This case required medical decision making of moderate complexity.

## 2015-08-24 NOTE — Assessment & Plan Note (Signed)
Patient did have increasing pain again. I do think that this mostly of his hip flexor. Given a refill of the baclofen. Discussed with patient to avoid any significant overdose. Patient wanted more pills at one time and we declined. We discussed potential side effects. Patient is going do some exercises on a more regular basis. We discussed topical anti-inflammatory. Patient come back and see me again in 4-6 weeks.

## 2015-08-24 NOTE — Patient Instructions (Addendum)
Good to see you The hip flexor is key.  Ice when you need it.  See me again in 3-4 weeks in case or move it back if doing well.

## 2015-08-24 NOTE — Progress Notes (Signed)
Pre visit review using our clinic review tool, if applicable. No additional management support is needed unless otherwise documented below in the visit note. 

## 2015-08-25 ENCOUNTER — Telehealth: Payer: Self-pay | Admitting: Family Medicine

## 2015-08-25 MED ORDER — PREDNISONE 50 MG PO TABS: ORAL_TABLET | ORAL | Status: DC

## 2015-08-25 NOTE — Telephone Encounter (Signed)
We can send him in prednsone if he would like.

## 2015-08-25 NOTE — Telephone Encounter (Signed)
Spoke to pt & he would like the rx for prednisone. rx sent into pharmacy.

## 2015-08-25 NOTE — Telephone Encounter (Signed)
Patient called in to advise that he is actually doing worse today than yesterday. He says that instead of a range of motion issue, every movement hurts. He would like advice on how to proceed

## 2015-09-02 ENCOUNTER — Other Ambulatory Visit: Payer: Self-pay

## 2015-09-02 ENCOUNTER — Telehealth: Payer: Self-pay | Admitting: Family Medicine

## 2015-09-02 DIAGNOSIS — M545 Low back pain: Secondary | ICD-10-CM

## 2015-09-02 NOTE — Telephone Encounter (Signed)
error 

## 2015-09-02 NOTE — Telephone Encounter (Signed)
Patient Name: Eric Hood DOB: September 03, 1981 Initial Comment Caller states he is having back pain. Nurse Assessment Nurse: Ronnald Ramp, RN, Miranda Date/Time (Eastern Time): 09/02/2015 9:04:19 AM Confirm and document reason for call. If symptomatic, describe symptoms. ---Caller states he has been having chronic back pain. A couple of months ago the pain started to be lower in his back, more in the middle of his back. At time the pain radiates to his right side. Seen by MD last week and prescribed prednisone but symptoms not improving. Has the patient traveled out of the country within the last 30 days? ---Not Applicable Does the patient have any new or worsening symptoms? ---Yes Will a triage be completed? ---Yes Related visit to physician within the last 2 weeks? ---Yes Does the PT have any chronic conditions? (i.e. diabetes, asthma, etc.) ---Yes List chronic conditions. ---GERD Is this a behavioral health call? ---No Guidelines Guideline Title Affirmed Question Affirmed Notes Back Pain [1] MODERATE back pain (e.g., interferes with normal activities) AND [2] present > 3 days Final Disposition User See PCP When Office is Open (within 3 days) Ronnald Ramp, Therapist, sports, Miranda Comments Called backline at the [facility] office and spoke with [place]. She states the Dr. Thompson Caul MA is already working on the xray and appt and will call the pt back. Referrals GO TO FACILITY OTHER - SPECIFY Disagree/Comply: Comply

## 2015-09-02 NOTE — Telephone Encounter (Signed)
Pt is returning your call. Please call back.

## 2015-09-02 NOTE — Telephone Encounter (Signed)
Scheduled tomorrow but if worsening then need to go to ED>

## 2015-09-02 NOTE — Telephone Encounter (Signed)
Patient states he finished prednisone and his back is still hurting really bad Please give him a call

## 2015-09-02 NOTE — Telephone Encounter (Signed)
We can see him tomorrow would want xray of thoracic and lumbar before appointment.

## 2015-09-02 NOTE — Telephone Encounter (Signed)
Spoke to pt, he is coming in tomorrow @ 9am to see dr. Tamala Julian

## 2015-09-03 ENCOUNTER — Ambulatory Visit (INDEPENDENT_AMBULATORY_CARE_PROVIDER_SITE_OTHER): Payer: PRIVATE HEALTH INSURANCE | Admitting: Family Medicine

## 2015-09-03 ENCOUNTER — Ambulatory Visit (INDEPENDENT_AMBULATORY_CARE_PROVIDER_SITE_OTHER)
Admission: RE | Admit: 2015-09-03 | Discharge: 2015-09-03 | Disposition: A | Discharge status: Home / Self Care | Payer: PRIVATE HEALTH INSURANCE | Source: Ambulatory Visit | Attending: Family Medicine | Admitting: Family Medicine

## 2015-09-03 ENCOUNTER — Encounter: Payer: Self-pay | Admitting: Family Medicine

## 2015-09-03 VITALS — BP 142/82 | HR 84 | Wt 182.0 lb

## 2015-09-03 DIAGNOSIS — M545 Low back pain: Secondary | ICD-10-CM

## 2015-09-03 DIAGNOSIS — M546 Pain in thoracic spine: Secondary | ICD-10-CM | POA: Diagnosis not present

## 2015-09-03 MED ORDER — ACYCLOVIR 400 MG PO TABS: 400.0000 mg | ORAL_TABLET | Freq: Three times a day (TID) | ORAL | Status: DC

## 2015-09-03 MED ORDER — VITAMIN D (ERGOCALCIFEROL) 1.25 MG (50000 UNIT) PO CAPS: 50000.0000 [IU] | ORAL_CAPSULE | ORAL | Status: DC

## 2015-09-03 NOTE — Progress Notes (Signed)
  [name] [address] [place], [place] [address] Phone: [phone] Subjective:     CC: Thoracic back pain, and low back pain.  [id number] Aleix Karmel is a 34 y.o. male coming in with complaint of back pain. Had significant exacerbation of his back pain. Seems to be more in the thoracic spine. An states that it does some spasms especially with rotation. Patient states he can catch his breath. Seems to feel better when he lays down her does not move. Denies any bowel or bladder incontinence. Denies any rash. States that it is tender to even light sensation on the rib cage on the right side. Does not remember any true injury.  Patient did have x-rays today. X-rays show some osteopenia but no significant bony abnormalities noted. This is of the thoracic and lumbar spine.   Review of Systems: No headache, visual changes, nausea, vomiting, diarrhea, constipation, dizziness, abdominal pain, skin rash, fevers, chills,swollen lymph nodes, body aches, joint swelling, muscle aches, chest pain, shortness of breath, mood changes.   Objective Blood pressure 142/82, pulse 84, weight 182 lb (82.555 kg).  General: looks much better HEENT: Pupils equal, extraocular movements intact  Respiratory: Patient's speak in full sentences and does not appear short of breath  Cardiovascular: No lower extremity edema, non tender, no erythema  Skin: Warm dry intact with no signs of infection or rash on extremities or on axial skeleton.  Abdomen: Soft abdomen non tender  through XII are intact, neurovascularly intact in all extremities with 2+ DTRs and 2+ pulses.  Lymph: No lymphadenopathy of posterior or anterior cervical chain or axillae bilaterally.  Gait normal with good balance and coordination.  MSK:  Non tender with full range of motion and good stability and symmetric strength and tone of shoulders, wrist, elbows, hip, knee and ankles bilaterally.    Back exam shows  the patient is severely tender to palpation at the thoracolumbar junction on the right side. Patient is even tender to light palpation going across the rib cage. No rash noted. No abdominal pain noted. Bowel sounds positive in all 4 quadrants with no masses. No spinous process tenderness. Trigger points are noted in the [facility] spinal musculature at the thoracolumbar junction.  After verbal consent patient was prepped with call swabs and with a 25-gauge 1 and she was injected in for trigger point places in the musculature. Minimal blood loss. Total amount of medications used was 4 mL of 0.5% Marcaine and 1 mL of Kenalog 40 mg/dL. Post injection instructions given.    Impression and Recommendations:     This case required medical decision making of moderate complexity.

## 2015-09-03 NOTE — Assessment & Plan Note (Signed)
Diagnosis is secondary to more of a muscle spasm. Differential also includes shingles with patient having pain with light palpation. I do not see any signs of fracture on x-ray today. No significant changes in the thoracolumbar junction noted. Patient did respond very well to the trigger point injections. Due to patient's accidental overdose a did not feel comfortable giving him any other type of pain medications at this time. Patient though does have some mild osteopenia so I would like to have patient take once weekly vitamin D.

## 2015-09-03 NOTE — Progress Notes (Signed)
Pre visit review using our clinic review tool, if applicable. No additional management support is needed unless otherwise documented below in the visit note. 

## 2015-09-03 NOTE — Patient Instructions (Addendum)
Good to see you Tried some trigger point injections.  Will give you some acyclovir incase this does become a rash and shingles.  Ice ice ice Try a tennisball in the area.  Lets do once weekly vitamin D for 12 weeks.  The health of your bones on xray show some osteopenia.  See me again in 2 weeks.

## 2015-09-16 ENCOUNTER — Telehealth: Payer: Self-pay | Admitting: Family Medicine

## 2015-09-16 ENCOUNTER — Encounter (HOSPITAL_BASED_OUTPATIENT_CLINIC_OR_DEPARTMENT_OTHER): Payer: Self-pay | Admitting: *Deleted

## 2015-09-16 ENCOUNTER — Emergency Department (HOSPITAL_BASED_OUTPATIENT_CLINIC_OR_DEPARTMENT_OTHER)
Admission: EM | Admit: 2015-09-16 | Discharge: 2015-09-17 | Disposition: A | Discharge status: Home / Self Care | Payer: PRIVATE HEALTH INSURANCE | Attending: Emergency Medicine | Admitting: Emergency Medicine

## 2015-09-16 DIAGNOSIS — M549 Dorsalgia, unspecified: Secondary | ICD-10-CM

## 2015-09-16 DIAGNOSIS — M546 Pain in thoracic spine: Secondary | ICD-10-CM | POA: Insufficient documentation

## 2015-09-16 DIAGNOSIS — G8929 Other chronic pain: Secondary | ICD-10-CM

## 2015-09-16 DIAGNOSIS — Z79899 Other long term (current) drug therapy: Secondary | ICD-10-CM | POA: Insufficient documentation

## 2015-09-16 DIAGNOSIS — K219 Gastro-esophageal reflux disease without esophagitis: Secondary | ICD-10-CM | POA: Insufficient documentation

## 2015-09-16 DIAGNOSIS — Z8659 Personal history of other mental and behavioral disorders: Secondary | ICD-10-CM | POA: Insufficient documentation

## 2015-09-16 DIAGNOSIS — D649 Anemia, unspecified: Secondary | ICD-10-CM | POA: Insufficient documentation

## 2015-09-16 LAB — URINALYSIS, ROUTINE W REFLEX MICROSCOPIC
Bilirubin Urine: NEGATIVE
Glucose, UA: NEGATIVE mg/dL
Hgb urine dipstick: NEGATIVE
Ketones, ur: 15 mg/dL — AB
LEUKOCYTES UA: NEGATIVE
NITRITE: NEGATIVE
Protein, ur: NEGATIVE mg/dL
Specific Gravity, Urine: 1.031 — ABNORMAL HIGH (ref 1.005–1.030)
pH: 6 (ref 5.0–8.0)

## 2015-09-16 MED ORDER — CYCLOBENZAPRINE HCL 10 MG PO TABS: 10.0000 mg | ORAL_TABLET | Freq: Two times a day (BID) | ORAL | Status: DC | PRN

## 2015-09-16 MED ORDER — HYDROCODONE-ACETAMINOPHEN 5-325 MG PO TABS: 1.0000 | ORAL_TABLET | Freq: Once | ORAL | Status: DC

## 2015-09-16 MED ORDER — ONDANSETRON 4 MG PO TBDP
4.0000 mg | ORAL_TABLET | Freq: Once | ORAL | Status: AC
  Administered 2015-09-16: 4 mg via ORAL
  Filled 2015-09-16: qty 1

## 2015-09-16 MED ORDER — KETOROLAC TROMETHAMINE 30 MG/ML IJ SOLN
30.0000 mg | Freq: Once | INTRAMUSCULAR | Status: AC
  Administered 2015-09-16: 30 mg via INTRAVENOUS
  Filled 2015-09-16: qty 1

## 2015-09-16 MED ORDER — DIAZEPAM 5 MG PO TABS
5.0000 mg | ORAL_TABLET | Freq: Once | ORAL | Status: AC
  Administered 2015-09-16: 5 mg via ORAL
  Filled 2015-09-16: qty 1

## 2015-09-16 MED ORDER — FENTANYL CITRATE (PF) 100 MCG/2ML IJ SOLN
50.0000 ug | Freq: Once | INTRAMUSCULAR | Status: AC
  Administered 2015-09-16: 50 ug via INTRAMUSCULAR
  Filled 2015-09-16: qty 2

## 2015-09-16 NOTE — ED Notes (Signed)
Pt called out asking for more pain medicine. PA made aware, states she will reassess pt momentarily.

## 2015-09-16 NOTE — ED Notes (Signed)
Pt verbalizes understanding of d/c instructions and medication. Ambulated from tx room to exit with family member with quick steady gait in NAD.

## 2015-09-16 NOTE — ED Notes (Signed)
Pt called out c/o that his stomach is beginning to hurt.

## 2015-09-16 NOTE — ED Notes (Signed)
Pt continues to heavily question the meds PA is ordering. Insisting that only Dilaudid works. Originally refused Valium and then changed his mind. Pt tolerated meds well.

## 2015-09-16 NOTE — ED Notes (Signed)
Pt asking many questions about the pain medicine. Insisting that Dilaudid works better. Stating that the provider was going to give him Morphine. Order confirmed and med given. Pt tolerated well.

## 2015-09-16 NOTE — Telephone Encounter (Signed)
Patient called back. Told him we would call him if there was a cancellation for tomorrow but is otherwise on the schedule for Monday.

## 2015-09-16 NOTE — Discharge Instructions (Signed)
Take your medications as prescribed as needed for pain relief. You may also continue taking Tylenol as prescribed over-the-counter for pain relief. Follow-up with your primary care provider regarding pain management of your chronic back pain within the next week. Turn to the emergency department if symptoms worsen or new onset of fever, numbness, tingling, groin anesthesia, loss of bowel or bladder, weakness.

## 2015-09-16 NOTE — Telephone Encounter (Signed)
Sorry whatever is next availible.

## 2015-09-16 NOTE — Telephone Encounter (Signed)
tient Name: Eric Hood  DOB: 1980-10-28    Initial Comment caller states he has a skin tag that is bleeding   Nurse Assessment  Nurse: Raphael Gibney, RN, Vanita Ingles Date/Time (Eastern Time): 09/16/2015 1:23:04 PM  Confirm and document reason for call. If symptomatic, describe symptoms. ---Caller states he has a skin tag under his left arm that is bleeding. Started bleeding about a week ago. Has had tag for quite a while. Bleeds when he rubs the area.  Has the patient traveled out of the country within the last 30 days? ---No  Does the patient have any new or worsening symptoms? ---Yes  Will a triage be completed? ---Yes  Related visit to physician within the last 2 weeks? ---No  Does the PT have any chronic conditions? (i.e. diabetes, asthma, etc.) ---No  Is this a behavioral health call? ---No     Guidelines    Guideline Title Affirmed Question Affirmed Notes  Skin Lesion - Moles or Growths [1] Skin growth or mole AND [2] bleeds or itches or is painful    Final Disposition User   See PCP within 2 Maudry Diego, RN, Vanita Ingles    Comments  Pt states he has appt at Dr. Erick Blinks office tomorrow at 10 am. Can not remember the name of the doctor.   Referrals  REFERRED TO PCP OFFICE   Disagree/Comply: Comply

## 2015-09-16 NOTE — Telephone Encounter (Signed)
Pt called in complain of severe back pain. Wanting to know if you can get him in this week

## 2015-09-16 NOTE — ED Provider Notes (Signed)
CSN: [id number]     Arrival date & time 09/16/15  1642 History   First MD Initiated Contact with Patient 09/16/15 1721     Chief Complaint  Patient presents with  . Back Pain     (Consider location/radiation/quality/duration/timing/severity/associated sxs/prior Treatment) HPI   Pt is a 34yo male with PMH of back pain, reflux, anxiety, drug overdose (04/2015) who presents to the ED with complaint of back pain. Patient reports having chronic upper back pain for the past 10 years after falling off a horse. He reports he started having right upper thoracic pain over the past 1-2 years. He reports having worsening pain to his right upper thoracic back today that started a week ago, denies radiation of pain. Reports pain is worsened with movement or sitting/standing. Denies any recent fall, trauma, injury. Patient reports she has been taking Tylenol at home without relief. Pt denies fever, numbness, tingling, abdominal pain, nausea, vomiting, urinary symptoms, saddle anesthesia, loss of bowel or bladder, weakness, IVDU, cancer or recent spinal manipulation. Patient reports he was seen by his orthopedist last week regarding his back pain when he had a negative x-ray at that time. He reports he currently is in the process of trying to find a new orthopedist. He notes he has to appointment scheduled for next week with 2 different orthopedists.  Past Medical History  Diagnosis Date  . Back pain   . Reflux   . Esophageal stricture 03/2014  . Hiatal hernia   . Ulcerative esophagitis 03/2014    severe  . Anxiety   . GERD (gastroesophageal reflux disease)   . Anemia 10/23/2014  . Blood transfusion without reported diagnosis april 2016   Past Surgical History  Procedure Laterality Date  . Hand surgery Right   . Esophageal dilation      On 3 occasions: 03/2014, 08/2014, 09/2014  . Esophagogastroduodenoscopy (egd) with propofol N/A 01/18/2015    Procedure: ESOPHAGOGASTRODUODENOSCOPY (EGD) WITH PROPOFOL;   Surgeon: Lafayette Dragon, MD;  Location: [facility];  Service: Endoscopy;  Laterality: N/A;  . Esophagogastroduodenoscopy N/A 05/20/2015    Procedure: ESOPHAGOGASTRODUODENOSCOPY (EGD);  Surgeon: Milus Banister, MD;  Location: [place];  Service: Endoscopy;  Laterality: N/A;  . Balloon dilation N/A 05/20/2015    Procedure: BALLOON DILATION;  Surgeon: Milus Banister, MD;  Location: [place];  Service: Endoscopy;  Laterality: N/A;   Family History  Problem Relation Age of Onset  . Hypertension Mother   . Hypertension Father   . Colon cancer Neg Hx   . Esophageal cancer Neg Hx   . Rectal cancer Neg Hx   . Stomach cancer Neg Hx    Social History  Substance Use Topics  . Smoking status: Never Smoker   . Smokeless tobacco: Never Used  . Alcohol Use: No    Review of Systems  Musculoskeletal: Positive for back pain.  All other systems reviewed and are negative.     Allergies  Hydrocodone  Home Medications   Prior to Admission medications   Medication Sig Start Date End Date Taking? Authorizing Provider  acyclovir (ZOVIRAX) 400 MG tablet Take 1 tablet (400 mg total) by mouth 3 (three) times daily. 09/03/15   Lyndal Pulley, DO  baclofen (LIORESAL) 10 MG tablet Take 1 tablet (10 mg total) by mouth 3 (three) times daily. 08/24/15   Lyndal Pulley, DO  cyclobenzaprine (FLEXERIL) 10 MG tablet Take 1 tablet (10 mg total) by mouth 2 (two) times daily as needed for muscle spasms. 09/16/15  Chesley Noon Jahden Schara, PA-C  pantoprazole (PROTONIX) 40 MG tablet TAKE 1 TABLET (40 MG TOTAL) BY MOUTH 2 (TWO) TIMES DAILY. 02/10/15   Lyndal Pulley, DO  ranitidine (ZANTAC) 300 MG tablet Take 1 tablet (300 mg total) by mouth at bedtime. 02/23/15   Ladene Artist, MD  vitamin B-12 (CYANOCOBALAMIN) 250 MCG tablet Take 1 tablet (250 mcg total) by mouth daily. 05/20/15   Shanker Kristeen Mans, MD  Vitamin D, Ergocalciferol, (DRISDOL) 50000 UNITS CAPS capsule Take 1 capsule (50,000 Units total) by mouth  every 7 (seven) days. 09/03/15   Lyndal Pulley, DO   BP 113/78 mmHg  Pulse 73  Temp(Src) 98.6 F (37 C) (Oral)  Resp 18  Ht 6' (1.829 m)  Wt 82.555 kg  BMI 24.68 kg/m2  SpO2 99% Physical Exam  Constitutional: He is oriented to person, place, and time. He appears well-developed and well-nourished.  HENT:  Head: Normocephalic and atraumatic.  Eyes: Conjunctivae and EOM are normal. Right eye exhibits no discharge. Left eye exhibits no discharge. No scleral icterus.  Neck: Normal range of motion. Neck supple.  Cardiovascular: Normal rate, regular rhythm, normal heart sounds and intact distal pulses.   Pulmonary/Chest: Effort normal and breath sounds normal. No respiratory distress. He has no wheezes. He has no rales. He exhibits no tenderness.  Abdominal: Soft. Bowel sounds are normal. He exhibits no distension and no mass. There is no tenderness. There is no rebound and no guarding.  Musculoskeletal: He exhibits tenderness. He exhibits no edema.  No midline C/T/L tenderness. TTP at right upper thoracic paraspinal muscles. Decreased range of motion of back due to pain. Patient able to stand and ambulate in room. 5/5 strength of bilateral lower extremities, sensation intact, 2+ PT pulses, cap refill less than 2. Full range of motion of bilateral lower extremities. Negative straight leg raise.  Neurological: He is alert and oriented to person, place, and time.  Skin: Skin is warm and dry.  Nursing note and vitals reviewed.   ED Course  Procedures (including critical care time) Labs Review Labs Reviewed  URINALYSIS, ROUTINE W REFLEX MICROSCOPIC (NOT AT [facility] - Abnormal; Notable for the following:    APPearance CLOUDY (*)    Specific Gravity, Urine 1.031 (*)    Ketones, ur 15 (*)    All other components within normal limits    Imaging Review No results found. I have personally reviewed and evaluated these images and lab results as part of my medical decision-making.  Filed Vitals:    09/16/15 1905 09/16/15 2037  BP: 113/70 113/78  Pulse: 76 73  Temp: 98.6 F (37 C)   Resp: 18 18     MDM   Final diagnoses:  Chronic back pain    Patient presents with worsening of his chronic back pain. Denies any recent fall, trauma, injury. No back pain red flags. Patient reports he is currently in the process of seeing multiple new orthopedist regarding his back pain. He reports having an x-ray done last week by his orthopedist which was negative. Hx of drug overdose. VSS. Exam revealed mild tenderness to right upper thoracic paraspinal muscles, no neuro deficits, no midline tenderness. Bilateral lower extremities neurovascularly intact.  Nurse reports that patient is requesting Dilaudid for his pain and reports that he is allergic to other narcotics and cannot take NSAIDs due to reflux. After discussing pain management with patient we agreed on trying fentanyl. Patient reports his pain has slightly improved after given medication. Nurse reports patient  is continuing to ask for more pain medicine on reassessment. I discussed with patient that his presentation is consistent with his chronic back pain that he has had for several years and I do not feel that any imaging is warranted at this time. I do not suspect any acute fracture, spinal cord compression/cauda equina syndrome. I suspect patient's pain is likely due to musculoskeletal etiology, likely muscle strain/spasm. Patient given Toradol and Valium for pain. Plan to discharge patient home and follow-up with orthopedist at his scheduled appointment on Monday.   Evaluation does not show pathology requring ongoing emergent intervention or admission. Pt is hemodynamically stable and mentating appropriately. All questions answered. Return precautions discussed and outpatient follow up given.      Chesley Noon [place], [place] 09/16/15 2202  Harvel Quale, MD 09/17/15 [phone]

## 2015-09-16 NOTE — ED Notes (Signed)
Mid back pain. States he needs pain medication. He is in the process of having an extensive workup to find out why he is having the pain.

## 2015-09-17 ENCOUNTER — Ambulatory Visit: Payer: PRIVATE HEALTH INSURANCE | Admitting: Family Medicine

## 2015-09-17 DIAGNOSIS — Z0289 Encounter for other administrative examinations: Secondary | ICD-10-CM

## 2015-09-20 ENCOUNTER — Ambulatory Visit: Payer: PRIVATE HEALTH INSURANCE | Admitting: Family Medicine

## 2015-09-21 ENCOUNTER — Encounter: Payer: Self-pay | Admitting: Family Medicine

## 2015-09-21 ENCOUNTER — Ambulatory Visit (INDEPENDENT_AMBULATORY_CARE_PROVIDER_SITE_OTHER): Payer: PRIVATE HEALTH INSURANCE | Admitting: Family Medicine

## 2015-09-21 VITALS — BP 122/74 | HR 74 | Ht 72.0 in | Wt 179.0 lb

## 2015-09-21 DIAGNOSIS — M546 Pain in thoracic spine: Secondary | ICD-10-CM

## 2015-09-21 NOTE — Progress Notes (Signed)
  [name] [address] [place], [place] [address] Phone: [phone] Subjective:     CC: Thoracic back pain, and low back pain.  [id number] Eric Hood is a 34 y.o. male coming in with complaint of back pain. Having more still on the right side and thoracolumbar junction pain. Seems to be more on the right side. Patient was having significant amount pain that he went to the emergency department. Workup was fairly unremarkable. Patient was given fentanyl which did helped some of the pain. States though since and continues to have pain. Cannot do anti-inflammatories secondary to patient's history of gastroesophageal reflux as well as a gastrointestinal bleed previously. Patient has had significant workup for this including advance imaging with no significant findings. Patient is failed all other treatment options including osteopathic manipulation, formal physical therapy, even other alternative modalities. Patient is unable to take different medications on a regular basis mostly secondary to financial constraints. States that the pain is still debilitating most of the time. Can wake him up at night. Denies any fever, chills, or any abnormal weight loss.  Patient did have x-rays today. X-rays show some osteopenia but no significant bony abnormalities noted. This is of the thoracic and lumbar spine.   Review of Systems: No headache, visual changes, nausea, vomiting, diarrhea, constipation, dizziness, abdominal pain, skin rash, fevers, chills,swollen lymph nodes, body aches, joint swelling, muscle aches, chest pain, shortness of breath, mood changes.   Objective Blood pressure 122/74, pulse 74, height 6' (1.829 m), weight 179 lb (81.194 kg), SpO2 99 %.  General: looks much better HEENT: Pupils equal, extraocular movements intact  Respiratory: Patient's speak in full sentences and does not appear short of breath  Cardiovascular: No lower extremity edema, non  tender, no erythema  Skin: Warm dry intact with no signs of infection or rash on extremities or on axial skeleton.  Abdomen: Soft abdomen non tender  through XII are intact, neurovascularly intact in all extremities with 2+ DTRs and 2+ pulses.  Lymph: No lymphadenopathy of posterior or anterior cervical chain or axillae bilaterally.  Gait normal with good balance and coordination.  MSK:  Non tender with full range of motion and good stability and symmetric strength and tone of shoulders, wrist, elbows, hip, knee and ankles bilaterally.    Back exam shows the patient is tender to palpation at the thoracolumbar junction on the right side. This tender to light palpation than previous exam. No rash noted. No abdominal pain noted. Bowel sounds positive in all 4 quadrants with no masses. No spinous process tenderness. Trigger points over the last time has completely resolved.    Impression and Recommendations:     This case required medical decision making of moderate complexity.

## 2015-09-21 NOTE — Patient Instructions (Addendum)
Good to see you I am sorry you are still hurting I hope a new set of eyes can help Iron 65mg  daily with vitamin C and the vitamin D can help Check [facility] and [facility] for $4 list of medications.  Try the uloric daily for next 2 weeks to see if gout plays a role.  Possibly have him do some labs such as for gout, vitamin D level and iron.  If you have questions you know where I am

## 2015-09-21 NOTE — Progress Notes (Signed)
Pre visit review using our clinic review tool, if applicable. No additional management support is needed unless otherwise documented below in the visit note. 

## 2015-09-21 NOTE — Assessment & Plan Note (Signed)
Patient continues to have this significant difficult he. We discussed with patient at this time that we have exhausted most different treatment options. Patient has had workup that has been fairly unremarkable. Has had many different medical problems including the gastrointestinal bleed that did cause some concern. Patient has been fairly noncompliant with medications secondary to financial constraints. Patient is no longer even taking the different vitamin supplementations that seemed to be helping such as a iron and vitamin D initially. Patient is not responding to the osteopathic manipulation at this time. I do not feel that repeating advance imaging at this time would be warranted. I am not comfortable with giving any pain medications with patient having 2 admissions for narcotic overdose over the course the last 15 months. Patient is seen another provider this week and hopefully that they will have some insight of the situation. Discussed with patient we can continue to refill the muscle relaxer if needed. Differential we discussed also could include possible gout which patient has not been tested for previously. Seems interested with the be more in the thoracolumbar junctional. No histories of kidney stones noted. All patient's questions were answered. Patient will return as needed.  Spent  25 minutes with patient face-to-face and had greater than 50% of counseling including as described above in assessment and plan.

## 2015-09-23 ENCOUNTER — Encounter: Payer: Self-pay | Admitting: Family Medicine

## 2015-09-23 ENCOUNTER — Other Ambulatory Visit: Payer: Self-pay | Admitting: Family Medicine

## 2015-09-23 ENCOUNTER — Ambulatory Visit (INDEPENDENT_AMBULATORY_CARE_PROVIDER_SITE_OTHER): Payer: PRIVATE HEALTH INSURANCE | Admitting: Family Medicine

## 2015-09-23 VITALS — BP 110/90 | HR 114 | Temp 98.6°F | Resp 16

## 2015-09-23 DIAGNOSIS — D225 Melanocytic nevi of trunk: Secondary | ICD-10-CM

## 2015-09-23 DIAGNOSIS — D239 Other benign neoplasm of skin, unspecified: Secondary | ICD-10-CM

## 2015-09-23 DIAGNOSIS — D229 Melanocytic nevi, unspecified: Secondary | ICD-10-CM

## 2015-09-23 NOTE — Progress Notes (Signed)
   Subjective:    Patient ID: Eric Hood, male    DOB: [birth year], 34 y.o.   MRN: [id number]  HPI Here with irritated nevus left axillary region. Several days ago he woke up with some blood in this area. He does not recall any local trauma. Slowly growing in size. Not painful. He has history of multiple nevi on his trunk especially. We removed 1 left lower thoracic area over couple years ago that was moderate dysplasia. He is not aware of any other changes with any other nevi.  Past Medical History  Diagnosis Date  . Back pain   . Reflux   . Esophageal stricture 03/2014  . Hiatal hernia   . Ulcerative esophagitis 03/2014    severe  . Anxiety   . GERD (gastroesophageal reflux disease)   . Anemia 10/23/2014  . Blood transfusion without reported diagnosis april 2016   Past Surgical History  Procedure Laterality Date  . Hand surgery Right   . Esophageal dilation      On 3 occasions: 03/2014, 08/2014, 09/2014  . Esophagogastroduodenoscopy (egd) with propofol N/A 01/18/2015    Procedure: ESOPHAGOGASTRODUODENOSCOPY (EGD) WITH PROPOFOL;  Surgeon: Lafayette Dragon, MD;  Location: [facility];  Service: Endoscopy;  Laterality: N/A;  . Esophagogastroduodenoscopy N/A 05/20/2015    Procedure: ESOPHAGOGASTRODUODENOSCOPY (EGD);  Surgeon: Milus Banister, MD;  Location: [place];  Service: Endoscopy;  Laterality: N/A;  . Balloon dilation N/A 05/20/2015    Procedure: BALLOON DILATION;  Surgeon: Milus Banister, MD;  Location: [place];  Service: Endoscopy;  Laterality: N/A;    reports that he has never smoked. He has never used smokeless tobacco. He reports that he does not drink alcohol or use illicit drugs. family history includes Hypertension in his father and mother. There is no history of Colon cancer, Esophageal cancer, Rectal cancer, or Stomach cancer. Allergies  Allergen Reactions  . Hydrocodone     Vomiting       Review of Systems  Constitutional: Negative for appetite change  and unexpected weight change.  Hematological: Negative for adenopathy.       Objective:   Physical Exam  Constitutional: He appears well-developed and well-nourished.  Cardiovascular: Normal rate and regular rhythm.   Pulmonary/Chest: Breath sounds normal. No respiratory distress. He has no wheezes. He has no rales.  Skin:  Left axillary region-fleshy colored well-demarcated dermal nevus about 6-7 mm diameter at its base. No atypical features  He does have several nevi on his truck and he has a couple including mid upper thoracic area and left lower lumbar area have a with some asymmetry and color variegation          Assessment & Plan:  #1 dermal nevus left axillary region. Recently irritated and bleeding. No atypical features. Discussed risks and benefits of shave excision including risks of bleeding, bruising, scar formation, and infection and patient consented to proceed.  Skin prepped with betadine and alcohol and shave excision of lesion with #15 blade with minimal bleeding.  Patient tolerated well.  Antibiotic and dressing  applied. Specimen sent to [facility] for further evaluation #2 atypical nevi. Past history of dysplastic nevi back. We've highly advise referral to dermatology for opinion regarding his multiple nevi

## 2015-09-23 NOTE — Progress Notes (Signed)
Pre visit review using our clinic review tool, if applicable. No additional management support is needed unless otherwise documented below in the visit note. 

## 2015-09-23 NOTE — Patient Instructions (Addendum)
  Moles Moles are usually harmless growths on the skin. They are accumulations of color (pigment) cells in the skin that:   Can be various colors, from light brown to black.  Can appear anywhere on the body.  May remain flat or become raised.  May contain hairs.  May remain smooth or develop wrinkling. Most moles are not cancerous (benign). However, some moles may develop changes and become cancerous. It is important to check your moles every month. If you check your moles regularly, you will be able to notice any changes that may occur.  CAUSES  Moles occur when skin cells grow together in clusters instead of spreading out in the skin as they normally do. The reason for this clustering is unknown. DIAGNOSIS  Your caregiver will perform a skin examination to diagnose your mole.  TREATMENT  Moles usually do not require treatment. If a mole becomes worrisome, your caregiver may choose to take a sample of the mole or remove it entirely, and then send it to a lab for examination.  HOME CARE INSTRUCTIONS  Check your mole(s) monthly for changes that may indicate skin cancer. These changes can include:  A change in size.  A change in color. Note that moles tend to darken during pregnancy or when taking birth control pills (oral contraception).  A change in shape.  A change in the border of the mole.  Wear sunscreen (with an SPF of at least 31) when you spend long periods of time outside. Reapply the sunscreen every 2-3 hours.  Schedule annual appointments with your skin doctor (dermatologist) if you have a large number of moles. SEEK MEDICAL CARE IF:  Your mole changes size, especially if it becomes larger than a pencil eraser.  Your mole changes in color or develops more than one color.  Your mole becomes itchy or bleeds.  Your mole, or the skin near the mole, becomes painful, sore, red, or swollen.  Your mole becomes scaly, sheds skin, or oozes fluid.  Your mole develops  irregular borders.  Your mole becomes flat or develops raised areas.  Your mole becomes hard or soft.   This information is not intended to replace advice given to you by your health care provider. Make sure you discuss any questions you have with your health care provider.   Document Released: 06/27/2001 Document Revised: 06/26/2012 Document Reviewed: 04/15/2012 Elsevier Interactive Patient Education 2016 [place] wound dry for the first 24 hours then clean daily with soap and water for one week. Apply topical antibiotic daily for 3-4 days. Keep covered with clean dressing for 4-5 days. Follow up promptly for any signs of infection such as redness, warmth, pain, or drainage.

## 2015-10-14 ENCOUNTER — Ambulatory Visit (INDEPENDENT_AMBULATORY_CARE_PROVIDER_SITE_OTHER): Payer: PRIVATE HEALTH INSURANCE | Admitting: Family Medicine

## 2015-10-14 ENCOUNTER — Encounter: Payer: Self-pay | Admitting: Family Medicine

## 2015-10-14 VITALS — BP 120/80 | HR 80 | Wt 175.0 lb

## 2015-10-14 DIAGNOSIS — S22000A Wedge compression fracture of unspecified thoracic vertebra, initial encounter for closed fracture: Secondary | ICD-10-CM | POA: Diagnosis not present

## 2015-10-14 DIAGNOSIS — K222 Esophageal obstruction: Secondary | ICD-10-CM | POA: Diagnosis not present

## 2015-10-14 MED ORDER — HYDROXYZINE HCL 10 MG PO TABS: 10.0000 mg | ORAL_TABLET | Freq: Three times a day (TID) | ORAL | Status: DC | PRN

## 2015-10-14 MED ORDER — ESOMEPRAZOLE MAGNESIUM 40 MG PO CPDR: 40.0000 mg | DELAYED_RELEASE_CAPSULE | Freq: Every day | ORAL | Status: DC

## 2015-10-14 NOTE — Assessment & Plan Note (Signed)
Patient given new prescription for Nexium today. We discussed continuing the Zantac. In addition of this patient will be given hydroxyzine for more of the anxiety component. We discussed patient should follow-up with his gastroenterologist in the near future. Patient will consider this. Discuss that I'm okay as long as he does well with this medication with prescribing for the next 6 months. Patient stated understanding. No stiffening worsening symptoms to seek medical attention immediately.

## 2015-10-14 NOTE — Assessment & Plan Note (Signed)
Seen on MRI and outside facility. Did have report but was unable to independently visualize. Debrided and entirety no. Seems to be stable overall. Likely older than 7-10 years. Patient likely will have some mild chronic discomfort of this upper back. We discussed with him about different treatment options again. Not normal for patient to have a compression fracture at such a young age. Likely secondary to some of the reabsorption and anemia problems patient has had over the years. We discussed once again referral to a rheumatologist for further workup for the possibility of an underlying disease that could be contributing. Patient has declined. We discussed again I do not feel that pain medications especially with his history of drug overdose will be a possibility to help. Patient will go and discuss this with another provider. We will not give any pain medications. The postural exercises and continuing to do muscle strengthening will be the most beneficial for him. Patient was not responding to osteopathic manipulation today do not think that it will be worth attempted again. Patient will come back and see me again on an as-needed basis.  Spent  25 minutes with patient face-to-face and had greater than 50% of counseling including as described above in assessment and plan.

## 2015-10-14 NOTE — Patient Instructions (Signed)
Good to see you Hydroxyzine try 1 pill with the baclofen and see if that helps Ice when you need it Try the nexium and hopefully this is better and cheaper.  Try to do some of the posture exercises.  We can consider working you up for gout at some point or try the uloric if you want to see if it helps I hope the other doc will help out See me in 6 weeks to make sure medicine is treating you right Happy New Year!

## 2015-10-14 NOTE — Progress Notes (Signed)
[name] [address] [place], [place] [address] Phone: [phone] Subjective:     CC: Thoracic back pain, and low back pain. Gastroesophageal reflux disease  [id number] Eric Hood is a 34 y.o. male coming in with complaint of back pain. Having more still on the right side and thoracolumbar junction pain. Seems to be more on the right side. Patient went and saw a back specialist. We did get an MRI of the thoracic spine showing the patient did have a stable T6 compression fracture with mild arthritic changes of the anterior columns. Report was reviewed by me today. Patient states that he continues to have the chronic pain. Was given muscle relaxers but did not give any pain medications. Patient is wondering if anything could be given to him. Patient states that the muscle relaxer does not recall the pain away.  Past Medical History  Diagnosis Date  . Back pain   . Reflux   . Esophageal stricture 03/2014  . Hiatal hernia   . Ulcerative esophagitis 03/2014    severe  . Anxiety   . GERD (gastroesophageal reflux disease)   . Anemia 10/23/2014  . Blood transfusion without reported diagnosis april 2016   Past Surgical History  Procedure Laterality Date  . Hand surgery Right   . Esophageal dilation      On 3 occasions: 03/2014, 08/2014, 09/2014  . Esophagogastroduodenoscopy (egd) with propofol N/A 01/18/2015    Procedure: ESOPHAGOGASTRODUODENOSCOPY (EGD) WITH PROPOFOL;  Surgeon: Lafayette Dragon, MD;  Location: [facility];  Service: Endoscopy;  Laterality: N/A;  . Esophagogastroduodenoscopy N/A 05/20/2015    Procedure: ESOPHAGOGASTRODUODENOSCOPY (EGD);  Surgeon: Milus Banister, MD;  Location: [facility];  Service: Endoscopy;  Laterality: N/A;  . Balloon dilation N/A 05/20/2015    Procedure: BALLOON DILATION;  Surgeon: Milus Banister, MD;  Location: [facility];  Service: Endoscopy;  Laterality: N/A;   Social History  Substance Use Topics  .  Smoking status: Never Smoker   . Smokeless tobacco: Never Used  . Alcohol Use: No   Allergies  Allergen Reactions  . Hydrocodone     Vomiting    Family History  Problem Relation Age of Onset  . Hypertension Mother   . Hypertension Father   . Colon cancer Neg Hx   . Esophageal cancer Neg Hx   . Rectal cancer Neg Hx   . Stomach cancer Neg Hx      Patient was having some mild increase in his of esophagitis. Patient states that his Protonix is too expensive to get. Patient is wondering if I would be able to change the medication.     Review of Systems: No headache, visual changes, nausea, vomiting, diarrhea, constipation, dizziness, abdominal pain, skin rash, fevers, chills,swollen lymph nodes, body aches, joint swelling, muscle aches, chest pain, shortness of breath, mood changes.   Objective Blood pressure 120/80, pulse 80, weight 175 lb (79.379 kg), SpO2 99 %.  General: looks much better HEENT: Pupils equal, extraocular movements intact  Respiratory: Patient's speak in full sentences and does not appear short of breath  Cardiovascular: No lower extremity edema, non tender, no erythema  Skin: Warm dry intact with no signs of infection or rash on extremities or on axial skeleton.  Abdomen: Soft abdomen with mild epigastric pain  through XII are intact, neurovascularly intact in all extremities with 2+ DTRs and 2+ pulses.  Lymph: No lymphadenopathy of posterior or anterior cervical chain or axillae bilaterally.  Gait normal with  good balance and coordination.  MSK:  Non tender with full range of motion and good stability and symmetric strength and tone of shoulders, wrist, elbows, hip, knee and ankles bilaterally.   Did not do back exam today.    Impression and Recommendations:     This case required medical decision making of moderate complexity.

## 2016-01-20 IMAGING — DX DG LUMBAR SPINE COMPLETE 4+V
5 series · 5 of 5 positions shown · non-contrast
Comparison: CT abdomen pelvis - 12/05/2014; thoracic spine
radiographs -earlier same day

CLINICAL DATA: Remote history of fall from horse with chronic back
pain. No recent injury.

EXAM:
LUMBAR SPINE - COMPLETE 4+ VIEW

[l-spine ap]
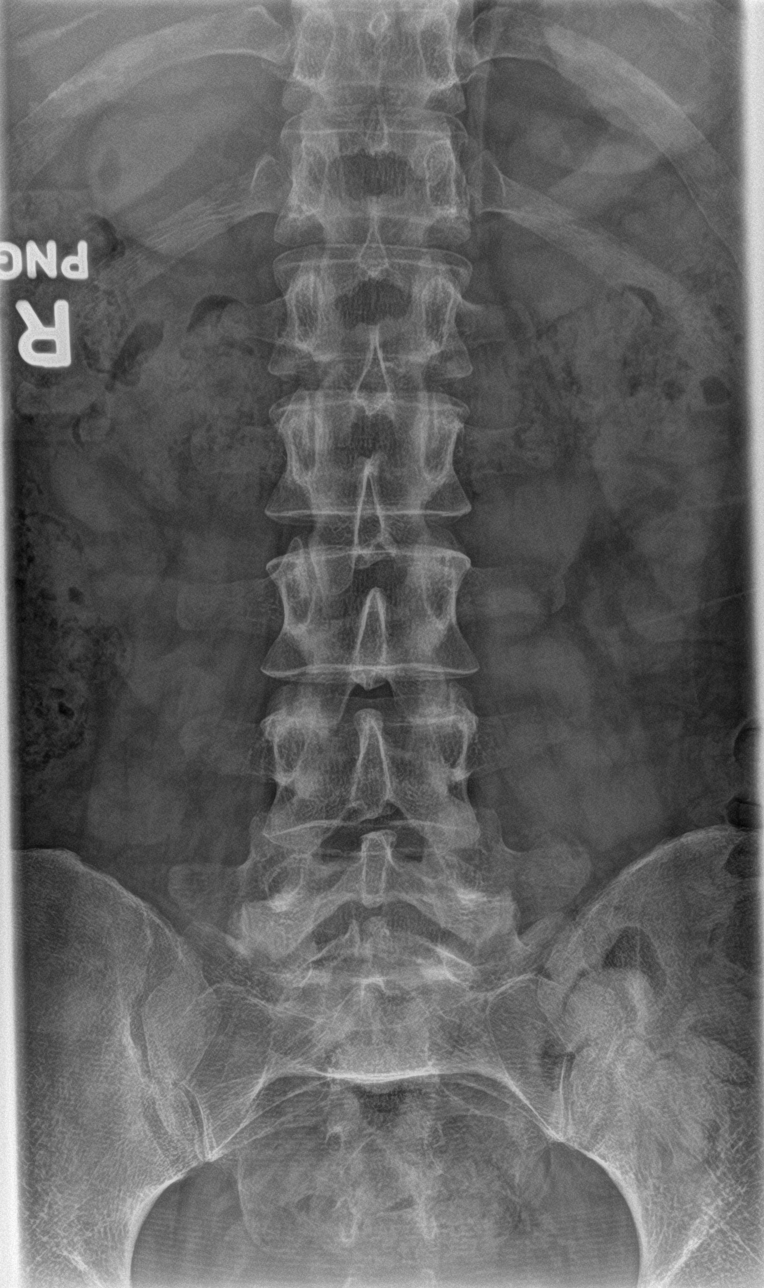

[l-spine obl (1 of 2)]
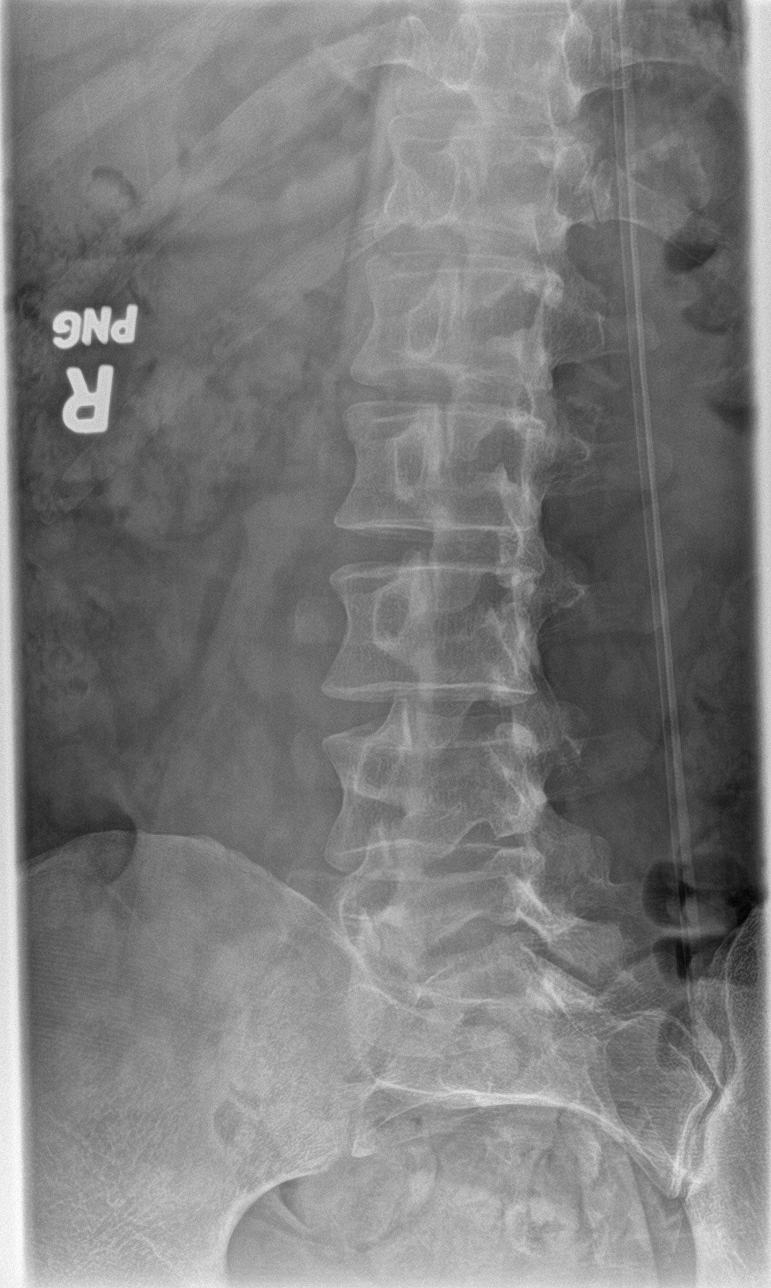

[l-spine obl (2 of 2)]
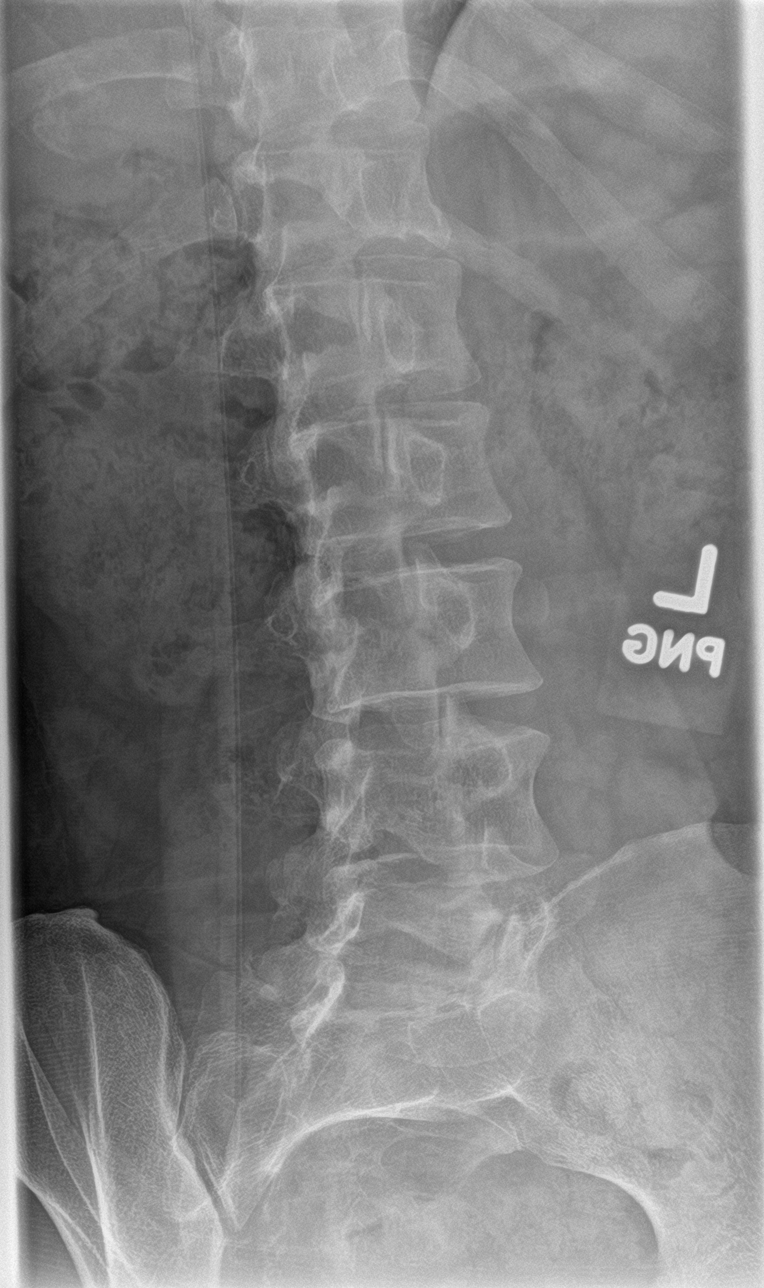

[l-spine lat]
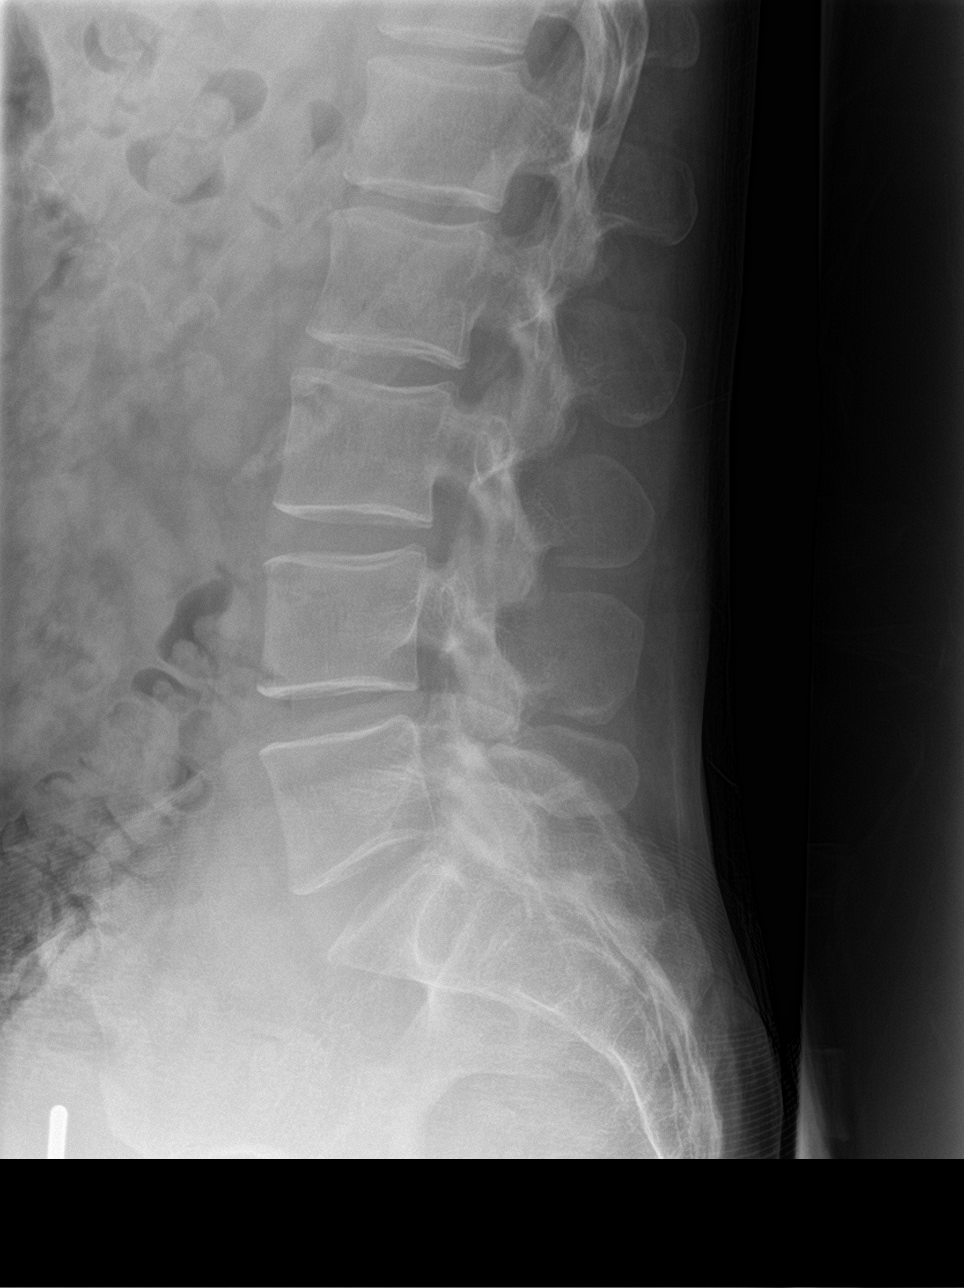

[l-spine spot]
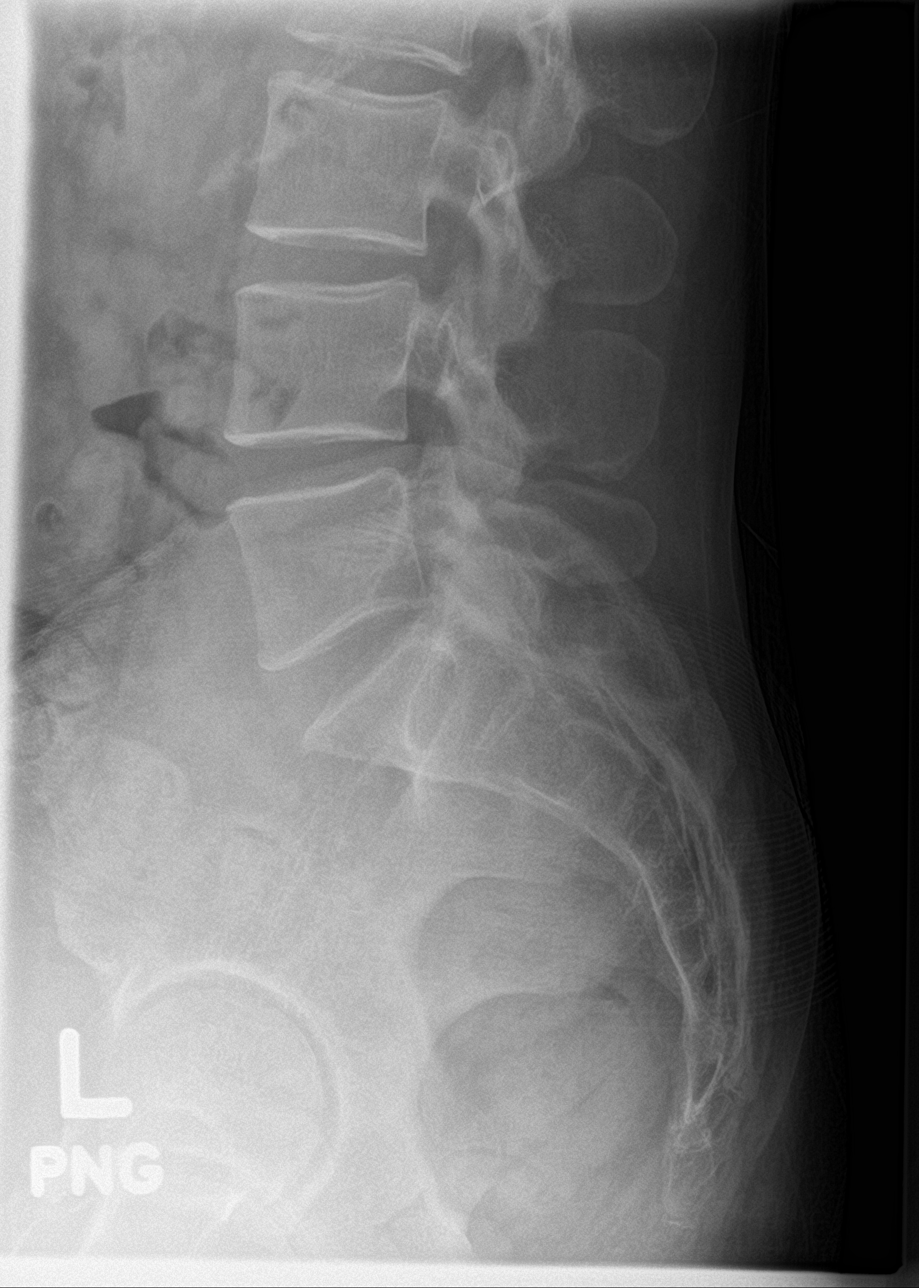

[5 of 5 positions shown; findings below may reference images not displayed]

FINDINGS: There are 5 non rib-bearing lumbar type vertebral bodies.

Normal alignment of the lumbar spine. No anterolisthesis or
retrolisthesis. No definite pars defects.

Lumbar vertebral body heights are preserved.

Intervertebral disc space heights are preserved.

Limited visualization of the bilateral SI joints is normal.

Regional bowel gas pattern and soft tissues are normal.
IMPRESSION: Normal radiographs of the lumbar spine.

## 2016-03-29 ENCOUNTER — Encounter: Payer: Self-pay | Admitting: Family Medicine

## 2016-03-29 ENCOUNTER — Ambulatory Visit (INDEPENDENT_AMBULATORY_CARE_PROVIDER_SITE_OTHER): Payer: Self-pay | Admitting: Family Medicine

## 2016-03-29 VITALS — BP 122/90 | HR 101 | Temp 98.4°F | Ht 72.0 in | Wt 181.0 lb

## 2016-03-29 DIAGNOSIS — J029 Acute pharyngitis, unspecified: Secondary | ICD-10-CM

## 2016-03-29 NOTE — Progress Notes (Signed)
Pre visit review using our clinic review tool, if applicable. No additional management support is needed unless otherwise documented below in the visit note. 

## 2016-03-29 NOTE — Progress Notes (Signed)
   Subjective:    Patient ID: Eric Hood, male    DOB: 1981/02/06, 35 y.o.   MRN: [id number]  HPI Acute visit for sore throat. Onset about 4 days ago. Minimal nasal congestion. He's also had some cough which has mostly been nonproductive. No associated fevers or chills. No sick contacts. He had one episode of vomiting last Saturday but none whatsoever since then. No diarrhea. He avoids nonsteroidals secondary to past history of upper GI bleed.  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 He states that his back pain is well controlled and he is been much more functional and able to work  Past Medical History  Diagnosis Date  . Back pain   . Reflux   . Esophageal stricture 03/2014  . Hiatal hernia   . Ulcerative esophagitis 03/2014    severe  . Anxiety   . GERD (gastroesophageal reflux disease)   . Anemia 10/23/2014  . Blood transfusion without reported diagnosis april 2016   Past Surgical History  Procedure Laterality Date  . Hand surgery Right   . Esophageal dilation      On 3 occasions: 03/2014, 08/2014, 09/2014  . Esophagogastroduodenoscopy (egd) with propofol N/A 01/18/2015    Procedure: ESOPHAGOGASTRODUODENOSCOPY (EGD) WITH PROPOFOL;  Surgeon: Lafayette Dragon, MD;  [facility];  Service: Endoscopy;  Laterality: N/A;  . Esophagogastroduodenoscopy N/A 05/20/2015    Procedure: ESOPHAGOGASTRODUODENOSCOPY (EGD);  Surgeon: Milus Banister, MD;  Location: [facility];  Service: Endoscopy;  Laterality: N/A;  . Balloon dilation N/A 05/20/2015    Procedure: BALLOON DILATION;  Surgeon: Milus Banister, MD;  Location: [facility];  Service: Endoscopy;  Laterality: N/A;    reports that he has never smoked. He has never used smokeless tobacco. He reports that he does not drink alcohol or use illicit drugs. family history includes Hypertension in his father and mother. There is no history  of Colon cancer, Esophageal cancer, Rectal cancer, or Stomach cancer. Allergies  Allergen Reactions  . Hydrocodone     Vomiting       Review of Systems  Constitutional: Negative for fever and chills.  HENT: Positive for congestion and sore throat. Negative for ear pain and voice change.   Respiratory: Positive for cough. Negative for shortness of breath and wheezing.        Objective:   Physical Exam  Constitutional: He appears well-developed and well-nourished.  HENT:  Right Ear: External ear normal.  Left Ear: External ear normal.  Mouth/Throat: Oropharynx is clear and moist.  Neck: Neck supple.  Cardiovascular: Normal rate and regular rhythm.   Pulmonary/Chest: Breath sounds normal. No respiratory distress. He has no wheezes. He has no rales.  Lymphadenopathy:    He has no cervical adenopathy.          Assessment & Plan:  Acute pharyngitis. Likely viral. We elected not to check rapid strep given very low probability (age, no exudate, no cervical adenopathy, lack of fever, presence of cough and nasal congestion).  Treat symptomatically. Avoid nonsteroidals with prior history of upper GI bleed. He'll try some Tylenol and choloroseptic. Follow-up promptly for any fever or new symptoms  Eulas Post MD [facility]

## 2016-03-29 NOTE — Patient Instructions (Signed)

## 2016-09-12 ENCOUNTER — Encounter (HOSPITAL_COMMUNITY): Payer: Self-pay | Admitting: Emergency Medicine

## 2016-09-12 ENCOUNTER — Emergency Department (HOSPITAL_COMMUNITY)
Admission: EM | Admit: 2016-09-12 | Discharge: 2016-09-12 | Disposition: A | Discharge status: Home / Self Care | Payer: Self-pay | Attending: Emergency Medicine | Admitting: Emergency Medicine

## 2016-09-12 DIAGNOSIS — Y929 Unspecified place or not applicable: Secondary | ICD-10-CM | POA: Insufficient documentation

## 2016-09-12 DIAGNOSIS — X58XXXA Exposure to other specified factors, initial encounter: Secondary | ICD-10-CM | POA: Insufficient documentation

## 2016-09-12 DIAGNOSIS — T18108A Unspecified foreign body in esophagus causing other injury, initial encounter: Secondary | ICD-10-CM | POA: Insufficient documentation

## 2016-09-12 DIAGNOSIS — Z79899 Other long term (current) drug therapy: Secondary | ICD-10-CM | POA: Insufficient documentation

## 2016-09-12 DIAGNOSIS — Y939 Activity, unspecified: Secondary | ICD-10-CM | POA: Insufficient documentation

## 2016-09-12 DIAGNOSIS — Y999 Unspecified external cause status: Secondary | ICD-10-CM | POA: Insufficient documentation

## 2016-09-12 NOTE — ED Notes (Signed)
Pt states that he felt the piece of pork in his throat go down. Pt is now able to tolerate oral liquids.

## 2016-09-12 NOTE — ED Provider Notes (Signed)
[facility] DEPT Provider Note   CSN: [id number] Arrival date & time: 09/12/16  1411     History   Chief Complaint Chief Complaint  Patient presents with  . choked on food    HPI Eric Hood is a 35 y.o. male.  Patient states that he felt like he had a piece of meat stuck in his esophagus for the last 8 hours. Patient has had his esophagus dilated 8 times. Patient now states that he feels like when he walked back into the emergency department the meeting went down into his stomach and he feels fine now.   The history is provided by the patient. No language interpreter was used.  Illness  This is a recurrent problem. The current episode started 6 to 12 hours ago. The problem occurs constantly. The problem has been resolved. Pertinent negatives include no chest pain, no abdominal pain and no headaches. Nothing aggravates the symptoms. Nothing relieves the symptoms.    Past Medical History:  Diagnosis Date  . Anemia 10/23/2014  . Anxiety   . Back pain   . Blood transfusion without reported diagnosis april 2016  . Esophageal stricture 03/2014  . GERD (gastroesophageal reflux disease)   . Hiatal hernia   . Reflux   . Ulcerative esophagitis 03/2014   severe    Patient Active Problem List   Diagnosis Date Noted  . Thoracic compression fracture ([facility]) 10/14/2015  . Trigger point of thoracic region 09/03/2015  . Acute lung injury   . Acute respiratory failure with hypoxia ([facility])   . Respiratory distress   . Respiratory failure ([facility]) 05/15/2015  . Acute respiratory failure with hypoxemia ([facility])   . Aspiration pneumonia ([facility])   . Acute encephalopathy   . Narcotic overdose   . Loss of weight 02/10/2015  . Scaphoid fracture of wrist 01/20/2015  . Acute esophagitis   . Benign esophageal stricture   . Food impaction of esophagus   . Acute right lower quadrant pain 11/13/2014  . Anemia 10/23/2014  . Heme positive stool 10/23/2014  . GERD (gastroesophageal reflux  disease) 10/23/2014  . Right-sided chest/RUQ pain 10/23/2014  . History of esophagitis and stricture 10/23/2014  . Anxiety state, unspecified 06/15/2014  . Scapular dysfunction 03/29/2014  . Nonallopathic lesion of thoracic region 03/29/2014  . Nonallopathic lesion of cervical region 03/29/2014  . Nonallopathic lesion-rib cage 03/29/2014  . Nonallopathic lesion of sacral region 03/29/2014  . Nonallopathic lesion of lumbosacral region 03/29/2014  . Dysplastic nevus of trunk 03/07/2013  . Thoracic back pain 02/11/2013    Past Surgical History:  Procedure Laterality Date  . BALLOON DILATION N/A 05/20/2015   Procedure: BALLOON DILATION;  Surgeon: Milus Banister, MD;  Location: [facility];  Service: Endoscopy;  Laterality: N/A;  . ESOPHAGEAL DILATION     On 3 occasions: 03/2014, 08/2014, 09/2014  . ESOPHAGOGASTRODUODENOSCOPY N/A 05/20/2015   Procedure: ESOPHAGOGASTRODUODENOSCOPY (EGD);  Surgeon: Milus Banister, MD;  Location: [facility];  Service: Endoscopy;  Laterality: N/A;  . ESOPHAGOGASTRODUODENOSCOPY (EGD) WITH PROPOFOL N/A 01/18/2015   Procedure: ESOPHAGOGASTRODUODENOSCOPY (EGD) WITH PROPOFOL;  Surgeon: Lafayette Dragon, MD;  [facility];  Service: Endoscopy;  Laterality: N/A;  . HAND SURGERY Right        Home Medications    Prior to Admission medications   Medication Sig Start Date End Date Taking? Authorizing Provider  ranitidine (ZANTAC) 300 MG tablet Take 1 tablet (300 mg total) by mouth at bedtime. 02/23/15  Yes Ladene Artist, MD  esomeprazole (  NEXIUM) 40 MG capsule Take 1 capsule (40 mg total) by mouth daily. Patient not taking: Reported on 09/12/2016 10/14/15   Lyndal Pulley, DO  hydrOXYzine (ATARAX/VISTARIL) 10 MG tablet Take 1-2 tablets (10-20 mg total) by mouth 3 (three) times daily as needed for anxiety. Patient not taking: Reported on 03/29/2016 10/14/15   Lyndal Pulley, DO    Family History Family History  Problem Relation Age of Onset  .  Hypertension Mother   . Hypertension Father   . Colon cancer Neg Hx   . Esophageal cancer Neg Hx   . Rectal cancer Neg Hx   . Stomach cancer Neg Hx     Social History Social History  Substance Use Topics  . Smoking status: Never Smoker  . Smokeless tobacco: Never Used  . Alcohol use No     Allergies   Hydrocodone   Review of Systems Review of Systems  Constitutional: Negative for appetite change and fatigue.  HENT: Negative for congestion, ear discharge and sinus pressure.        Difficulty swallowing  Eyes: Negative for discharge.  Respiratory: Negative for cough.   Cardiovascular: Negative for chest pain.  Gastrointestinal: Negative for abdominal pain and diarrhea.  Genitourinary: Negative for frequency and hematuria.  Musculoskeletal: Negative for back pain.  Skin: Negative for rash.  Neurological: Negative for seizures and headaches.  Psychiatric/Behavioral: Negative for hallucinations.     Physical Exam Updated Vital Signs BP (!) 135/103 (BP Location: Left Arm)   Pulse 88   Temp 97.9 F (36.6 C) (Oral)   Resp 18   Ht 6' (1.829 m)   Wt 185 lb (83.9 kg)   SpO2 97%   BMI 25.09 kg/m   Physical Exam  Constitutional: He is oriented to person, place, and time. He appears well-developed.  HENT:  Head: Normocephalic.  Eyes: Conjunctivae and EOM are normal. No scleral icterus.  Neck: Neck supple. No tracheal deviation present.  Cardiovascular: Exam reveals no gallop and no friction rub.   No murmur heard. Pulmonary/Chest: No stridor. He has no wheezes. He has no rales. He exhibits no tenderness.  Musculoskeletal: Normal range of motion.  Lymphadenopathy:    He has no cervical adenopathy.  Neurological: He is oriented to person, place, and time. Coordination normal.  Skin: No rash noted. No erythema.  Psychiatric: He has a normal mood and affect. His behavior is normal.     ED Treatments / Results  Labs (all labs ordered are listed, but only abnormal  results are displayed) Labs Reviewed - No data to display  EKG  EKG Interpretation None       Radiology No results found.  Procedures Procedures (including critical care time)  Medications Ordered in ED Medications - No data to display   Initial Impression / Assessment and Plan / ED Course  I have reviewed the triage vital signs and the nursing notes.  Pertinent labs & imaging results that were available during my care of the patient were reviewed by me and considered in my medical decision making (see chart for details).  Clinical Course     Patient with a meat impaction earlier today. This has resolved. Patient will follow up with GI next week and eat only soft foods until he is rechecked Final Clinical Impressions(s) / ED Diagnoses   Final diagnoses:  Foreign body in esophagus, initial encounter    New Prescriptions New Prescriptions   No medications on file     Milton Ferguson, MD 09/12/16 1434

## 2016-09-12 NOTE — ED Triage Notes (Signed)
Patient reports getting piece of pork stuck in throat this morning. Patient states that he is unable to swallow liquids as everything comes back up. Patient adds that he has had to have throat stretched 8-9 times.

## 2016-09-12 NOTE — Discharge Instructions (Signed)
Follow-up as planned with Dr. Fuller Plan. He only saw foods like applesauce loops return if problems

## 2016-09-19 ENCOUNTER — Ambulatory Visit: Payer: Self-pay | Admitting: Physician Assistant

## 2016-10-16 HISTORY — PX: COLONOSCOPY: SHX174

## 2017-07-04 ENCOUNTER — Encounter: Payer: Self-pay | Admitting: Physician Assistant

## 2017-07-04 ENCOUNTER — Encounter (INDEPENDENT_AMBULATORY_CARE_PROVIDER_SITE_OTHER): Payer: Self-pay

## 2017-07-04 ENCOUNTER — Ambulatory Visit (INDEPENDENT_AMBULATORY_CARE_PROVIDER_SITE_OTHER): Payer: Self-pay | Admitting: Physician Assistant

## 2017-07-04 VITALS — BP 110/70 | HR 96 | Ht 72.0 in | Wt 188.0 lb

## 2017-07-04 DIAGNOSIS — R1319 Other dysphagia: Secondary | ICD-10-CM

## 2017-07-04 DIAGNOSIS — K920 Hematemesis: Secondary | ICD-10-CM

## 2017-07-04 DIAGNOSIS — R1310 Dysphagia, unspecified: Secondary | ICD-10-CM

## 2017-07-04 DIAGNOSIS — K222 Esophageal obstruction: Secondary | ICD-10-CM

## 2017-07-04 MED ORDER — PANTOPRAZOLE SODIUM 40 MG PO TBEC: 40.0000 mg | DELAYED_RELEASE_TABLET | Freq: Two times a day (BID) | ORAL | 3 refills | Status: DC

## 2017-07-04 NOTE — Progress Notes (Signed)
Chief Complaint: Dysphagia  HPI:  Eric Hood is a 36 year old Caucasian male with a past medical history of multiple esophageal dilations due to stricture as well as ulcerative esophagitis and reflux,  who was referred to me by Eulas Post, MD for a complaint of dysphagia .     Per chart review patient's last EGD was performed by Dr. Fuller Plan 07/27/15 with findings of an esophageal stricture in the midesophagus dilated to 14 mm, erosive esophagitis and a 4-5 cm hiatal hernia. Patient was placed on a twice a day PPI.   Per chart review patient was seen in the ED in November for a food impaction, but the food passed down before he was seen.   Patient presents to clinic today and tells me that food got stuck in November and again 2 weeks ago. When the piece of meat got stuck 2 weeks ago, when the patient was coughing and trying to get this up, he also saw some bright red blood. It has happened a couple of times since then with various solid foods. Patient does note that currently he is not on his Esomeprazole 40 mg as this was too expensive and has just been taking his Zantac 300 mg at bedtime. He does continue with reflux and some esophageal discomfort.   Patient denies fever, chills, blood in the stool, melena, weight loss, fatigue,nausea and vomiting or symptoms that awaken him at night.  Past Medical History:  Diagnosis Date  . Anemia 10/23/2014  . Anxiety   . Back pain   . Blood transfusion without reported diagnosis april 2016  . Esophageal stricture 03/2014  . GERD (gastroesophageal reflux disease)   . Hiatal hernia   . Reflux   . Ulcerative esophagitis 03/2014   severe    Past Surgical History:  Procedure Laterality Date  . BALLOON DILATION N/A 05/20/2015   Procedure: BALLOON DILATION;  Surgeon: Milus Banister, MD;  Location: [facility];  Service: Endoscopy;  Laterality: N/A;  . ESOPHAGEAL DILATION     On 3 occasions: 03/2014, 08/2014, 09/2014  . ESOPHAGOGASTRODUODENOSCOPY N/A  05/20/2015   Procedure: ESOPHAGOGASTRODUODENOSCOPY (EGD);  Surgeon: Milus Banister, MD;  Location: [facility];  Service: Endoscopy;  Laterality: N/A;  . ESOPHAGOGASTRODUODENOSCOPY (EGD) WITH PROPOFOL N/A 01/18/2015   Procedure: ESOPHAGOGASTRODUODENOSCOPY (EGD) WITH PROPOFOL;  Surgeon: Lafayette Dragon, MD;  [facility];  Service: Endoscopy;  Laterality: N/A;  . HAND SURGERY Right     Current Outpatient Prescriptions  Medication Sig Dispense Refill  . ranitidine (ZANTAC) 300 MG tablet Take 1 tablet (300 mg total) by mouth at bedtime. 30 tablet 11  . esomeprazole (NEXIUM) 40 MG capsule Take 1 capsule (40 mg total) by mouth daily. (Patient not taking: Reported on 07/04/2017) 90 capsule 3  . hydrOXYzine (ATARAX/VISTARIL) 10 MG tablet Take 1-2 tablets (10-20 mg total) by mouth 3 (three) times daily as needed for anxiety. (Patient not taking: Reported on 07/04/2017) 90 tablet 1   No current facility-administered medications for this visit.     Allergies as of 07/04/2017 - Review Complete 07/04/2017  Allergen Reaction Noted  . Hydrocodone  09/16/2015    Family History  Problem Relation Age of Onset  . Hypertension Mother   . Hypertension Father   . Colon cancer Neg Hx   . Esophageal cancer Neg Hx   . Rectal cancer Neg Hx   . Stomach cancer Neg Hx     Social History   Social History  . Marital status: Single  Spouse name: N/A  . Number of children: N/A  . Years of education: N/A   Occupational History  . Not on file.   Social History Main Topics  . Smoking status: Never Smoker  . Smokeless tobacco: Never Used  . Alcohol use No  . Drug use: No     Comment: opiate abuse  . Sexual activity: No   Other Topics Concern  . Not on file   Social History Narrative  . No narrative on file    Review of Systems:    Constitutional: No weight loss, fever or chills Cardiovascular: No chest pain Respiratory: No SOB Gastrointestinal: See HPI and otherwise negative    Physical Exam:  Vital signs: BP 110/70   Pulse 96   Ht 6' (1.829 m)   Wt 188 lb (85.3 kg)   BMI 25.50 kg/m   Constitutional:   Pleasant Caucasian male appears to be in NAD, Well developed, Well nourished, alert and cooperative Head:  Normocephalic and atraumatic. Eyes:   PEERL, EOMI. No icterus. Conjunctiva pink. Ears:  Normal auditory acuity. Neck:  Supple Throat: Oral cavity and pharynx without inflammation, swelling or lesion.  Respiratory: Respirations even and unlabored. Lungs clear to auscultation bilaterally.   No wheezes, crackles, or rhonchi.  Cardiovascular: Normal S1, S2. No MRG. Regular rate and rhythm. No peripheral edema, cyanosis or pallor.  Gastrointestinal:  Soft, nondistended, nontender. No rebound or guarding. Normal bowel sounds. No appreciable masses or hepatomegaly. Psychiatric: Demonstrates good judgement and reason without abnormal affect or behaviors.  No recent labs or imaging.  Assessment: 1. Dysphagia: Multiple episodes over the past 6 months, one severe episode 2 weeks ago with some hematemesis; likely related to known esophageal stricture and erosive esophagitis 2. Hematemesis: 1 episode after a piece of meat got stuck in the patient's throat; likely related to erosive esophagitis as seen on previous scopes 3. History of esophageal stricture: Last dilated 2016  Plan: 1. Scheduled patient for an EGD with dilation in [place] with Dr. Fuller Plan. Did discuss risks, benefits, limitations and alternatives and the patient agrees to proceed. 2. Reviewed anti-dysphagia measures including taking small bites, drinking sips of water between bites, chewing well, avoiding distraction while eating and the chin tuck technique. 3. Prescribed Pantoprazole 40 mg twice a day, 30-60 minutes before eating breakfast and dinner #60 with 3 refills 4. Patient to follow up in clinic per recommendations from Dr. Fuller Plan after time of procedure.  Ellouise Newer, PA-C [facility] 07/04/2017, 10:03 AM  Cc: Eulas Post, MD

## 2017-07-04 NOTE — Progress Notes (Signed)
Reviewed and agree with management plan.  Jkai Arwood T. Regla Fitzgibbon, MD FACG 

## 2017-07-04 NOTE — Patient Instructions (Addendum)
You have been scheduled for an endoscopy. Please follow written instructions given to you at your visit today. If you use inhalers (even only as needed), please bring them with you on the day of your procedure. Your physician has requested that you go to www.startemmi.com and enter the access code given to you at your visit today. This web site gives a general overview about your procedure. However, you should still follow specific instructions given to you by our office regarding your preparation for the procedure.  We have sent the following medications to your pharmacy for you to pick up at your convenience: Pantoprazole 40 mg twice a day 30-60 mins before breakfast and dinner

## 2017-07-24 ENCOUNTER — Ambulatory Visit (AMBULATORY_SURGERY_CENTER): Payer: Self-pay | Admitting: Gastroenterology

## 2017-07-24 ENCOUNTER — Encounter: Payer: Self-pay | Admitting: Gastroenterology

## 2017-07-24 VITALS — BP 110/71 | HR 75 | Temp 99.3°F | Resp 12 | Ht 72.0 in | Wt 188.0 lb

## 2017-07-24 DIAGNOSIS — K222 Esophageal obstruction: Secondary | ICD-10-CM

## 2017-07-24 DIAGNOSIS — R1310 Dysphagia, unspecified: Secondary | ICD-10-CM

## 2017-07-24 MED ORDER — SODIUM CHLORIDE 0.9 % IV SOLN
500.0000 mL | INTRAVENOUS | Status: DC
Start: 2017-07-24 — End: 2017-07-24

## 2017-07-24 NOTE — Op Note (Signed)
[facility] Patient Name: Eric Hood Procedure Date: 07/24/2017 10:49 AM MRN: [id number] Endoscopist: Ladene Artist , MD Age: 36 Referring MD:  Date of Birth: 12/10/1980 Gender: Male Account #: [id number] Procedure:                Upper GI endoscopy Indications:              Dysphagia Medicines:                Monitored Anesthesia Care Procedure:                Pre-Anesthesia Assessment:                           - Prior to the procedure, a History and Physical                            was performed, and patient medications and                            allergies were reviewed. The patient's tolerance of                            previous anesthesia was also reviewed. The risks                            and benefits of the procedure and the sedation                            options and risks were discussed with the patient.                            All questions were answered, and informed consent                            was obtained. Prior Anticoagulants: The patient has                            taken no previous anticoagulant or antiplatelet                            agents. ASA Grade Assessment: II - A patient with                            mild systemic disease. After reviewing the risks                            and benefits, the patient was deemed in                            satisfactory condition to undergo the procedure.                           After obtaining informed consent, the endoscope was  passed under direct vision. Throughout the                            procedure, the patient's blood pressure, pulse, and                            oxygen saturations were monitored continuously. The                            Endoscope was introduced through the mouth, and                            advanced to the middle third of esophagus. The                            upper GI endoscopy was accomplished without                      difficulty. The patient tolerated the procedure                            well. Scope In: Scope Out: Findings:                 LA Grade C (one or more mucosal breaks continuous                            between tops of 2 or more mucosal folds, less than                            75% circumference) esophagitis with no bleeding was                            found in the mid esophagus.                           One severe benign-appearing, intrinsic stenosis was                            found in the mid esophagus. This measured 8 mm                            (inner diameter) x 3 cm (in length) and was not                            traversed. A TTS dilator was passed through the                            scope. Dilation with an 8.5-9.5-10.5 mm balloon                            dilator was performed to 10.5 mm. A TTS dilator was  passed through the scope. Dilation with an 08-27-12                            mm balloon dilator was performed to 12 mm.                            Following dilation the lumen appeared 1-2 mm wider                            and I was still unable to advance the scope past                            the stricture despite dilation. I was able to                            advance part was through the stricture Complications:            No immediate complications. Estimated Blood Loss:     Estimated blood loss was minimal. Impression:               - LA Grade C reflux esophagitis.                           - Benign-appearing esophageal stenosis. Dilated.                           - No specimens collected. Recommendation:           - Patient has a contact number available for                            emergencies. The signs and symptoms of potential                            delayed complications were discussed with the                            patient. Return to normal activities tomorrow.                             Written discharge instructions were provided to the                            patient.                           - Clear liquid diet for 2 hours, then advance as                            tolerated to soft diet indefinitely.                           - Continue present medications.                           - Repeat  upper endoscopy at the next available                            appointment for dilation under fluoscopy at                            hospital.                           - No aspirin, ibuprofen, naproxen, or other                            non-steroidal anti-inflammatory drugs. Ladene Artist, MD 07/24/2017 11:14:07 AM This report has been signed electronically.

## 2017-07-24 NOTE — Progress Notes (Signed)
Report to PACU, RN, vss, BBS= Clear.  

## 2017-07-24 NOTE — Patient Instructions (Signed)
YOU HAD AN ENDOSCOPIC PROCEDURE TODAY AT [facility]:   Refer to the procedure report that was given to you for any specific questions about what was found during the examination.  If the procedure report does not answer your questions, please call your gastroenterologist to clarify.  If you requested that your care partner not be given the details of your procedure findings, then the procedure report has been included in a sealed envelope for you to review at your convenience later.  YOU SHOULD EXPECT: Some feelings of bloating in the abdomen. Passage of more gas than usual.  Walking can help get rid of the air that was put into your GI tract during the procedure and reduce the bloating. If you had a lower endoscopy (such as a colonoscopy or flexible sigmoidoscopy) you may notice spotting of blood in your stool or on the toilet paper. If you underwent a bowel prep for your procedure, you may not have a normal bowel movement for a few days.  Please Note:  You might notice some irritation and congestion in your nose or some drainage.  This is from the oxygen used during your procedure.  There is no need for concern and it should clear up in a day or so.  SYMPTOMS TO REPORT IMMEDIATELY:     Following upper endoscopy (EGD)  Vomiting of blood or coffee ground material  New chest pain or pain under the shoulder blades  Painful or persistently difficult swallowing  New shortness of breath  Fever of 100F or higher  Black, tarry-looking stools  For urgent or emergent issues, a gastroenterologist can be reached at any hour by calling [phone].   DIET:  Follow Dilation Handout.   ACTIVITY:  You should plan to take it easy for the rest of today and you should NOT DRIVE or use heavy machinery until tomorrow (because of the sedation medicines used during the test).    FOLLOW UP: Our staff will call the number listed on your records the next business day following your procedure  to check on you and address any questions or concerns that you may have regarding the information given to you following your procedure. If we do not reach you, we will leave a message.  However, if you are feeling well and you are not experiencing any problems, there is no need to return our call.  We will assume that you have returned to your regular daily activities without incident.  If any biopsies were taken you will be contacted by phone or by letter within the next 1-3 weeks.  Please call us at [phone] if you have not heard about the biopsies in 3 weeks.    SIGNATURES/CONFIDENTIALITY: You and/or your care partner have signed paperwork which will be entered into your electronic medical record.  These signatures attest to the fact that that the information above on your After Visit Summary has been reviewed and is understood.  Full responsibility of the confidentiality of this discharge information lies with you and/or your care-partner.   No aspirin,ibuprofen,naproxen,or other non-steroidal anti-inflammatory drugs,resume remainder of medications. Office will call for dilation at hospital. Information given on dilation diet.

## 2017-07-25 ENCOUNTER — Telehealth: Payer: Self-pay

## 2017-07-25 NOTE — Telephone Encounter (Signed)
  Follow up Call-  Call Codee Tutson number 07/24/2017 07/27/2015 11/20/2014  Post procedure Call Caden Fatica phone  # [phone]  Permission to leave phone message Yes Yes Yes  Some recent data might be hidden     Patient questions:  Do you have a fever, pain , or abdominal swelling? No. Pain Score  0 *  Have you tolerated food without any problems? Yes.    Have you been able to return to your normal activities? Yes.    Do you have any questions about your discharge instructions: Diet   No. Medications  No. Follow up visit  No.  Do you have questions or concerns about your Care? No.  Actions: * If pain score is 4 or above: No action needed, pain <4.

## 2017-07-30 ENCOUNTER — Other Ambulatory Visit: Payer: Self-pay

## 2017-07-30 DIAGNOSIS — R1310 Dysphagia, unspecified: Secondary | ICD-10-CM

## 2017-07-30 DIAGNOSIS — K222 Esophageal obstruction: Secondary | ICD-10-CM

## 2017-07-30 NOTE — Progress Notes (Signed)
Instructions for EGD mailed to the patient.  He will call if he has any questions once he receives them in the mail.

## 2017-08-07 ENCOUNTER — Ambulatory Visit: Payer: Self-pay | Admitting: Gastroenterology

## 2017-08-28 ENCOUNTER — Encounter (HOSPITAL_COMMUNITY): Payer: Self-pay | Admitting: Emergency Medicine

## 2017-08-28 ENCOUNTER — Other Ambulatory Visit: Payer: Self-pay

## 2017-09-04 ENCOUNTER — Ambulatory Visit (HOSPITAL_COMMUNITY)
Admission: RE | Admit: 2017-09-04 | Discharge: 2017-09-04 | Disposition: A | Discharge status: Home / Self Care | Payer: Self-pay | Source: Ambulatory Visit | Attending: Gastroenterology | Admitting: Gastroenterology

## 2017-09-04 ENCOUNTER — Ambulatory Visit (HOSPITAL_COMMUNITY): Payer: Self-pay | Admitting: Anesthesiology

## 2017-09-04 ENCOUNTER — Encounter (HOSPITAL_COMMUNITY): Payer: Self-pay

## 2017-09-04 ENCOUNTER — Encounter (HOSPITAL_COMMUNITY)
Admission: RE | Disposition: A | Discharge status: Home / Self Care | Payer: Self-pay | Source: Ambulatory Visit | Attending: Gastroenterology

## 2017-09-04 ENCOUNTER — Other Ambulatory Visit: Payer: Self-pay

## 2017-09-04 ENCOUNTER — Ambulatory Visit (HOSPITAL_COMMUNITY): Payer: Self-pay

## 2017-09-04 DIAGNOSIS — Z885 Allergy status to narcotic agent status: Secondary | ICD-10-CM | POA: Insufficient documentation

## 2017-09-04 DIAGNOSIS — R1310 Dysphagia, unspecified: Secondary | ICD-10-CM

## 2017-09-04 DIAGNOSIS — K210 Gastro-esophageal reflux disease with esophagitis: Secondary | ICD-10-CM | POA: Insufficient documentation

## 2017-09-04 DIAGNOSIS — F419 Anxiety disorder, unspecified: Secondary | ICD-10-CM | POA: Insufficient documentation

## 2017-09-04 DIAGNOSIS — Z79899 Other long term (current) drug therapy: Secondary | ICD-10-CM | POA: Insufficient documentation

## 2017-09-04 DIAGNOSIS — K5190 Ulcerative colitis, unspecified, without complications: Secondary | ICD-10-CM | POA: Insufficient documentation

## 2017-09-04 DIAGNOSIS — K449 Diaphragmatic hernia without obstruction or gangrene: Secondary | ICD-10-CM | POA: Insufficient documentation

## 2017-09-04 DIAGNOSIS — K222 Esophageal obstruction: Secondary | ICD-10-CM | POA: Insufficient documentation

## 2017-09-04 DIAGNOSIS — K209 Esophagitis, unspecified: Secondary | ICD-10-CM

## 2017-09-04 HISTORY — PX: SAVORY DILATION: SHX5439

## 2017-09-04 HISTORY — PX: ESOPHAGOGASTRODUODENOSCOPY (EGD) WITH PROPOFOL: SHX5813

## 2017-09-04 SURGERY — ESOPHAGOGASTRODUODENOSCOPY (EGD) WITH PROPOFOL
Anesthesia: Monitor Anesthesia Care

## 2017-09-04 MED ORDER — MIDAZOLAM HCL 5 MG/5ML IJ SOLN
INTRAMUSCULAR | Status: DC | PRN
  Administered 2017-09-04: 2 mg via INTRAVENOUS

## 2017-09-04 MED ORDER — ONDANSETRON HCL 4 MG/2ML IJ SOLN
INTRAMUSCULAR | Status: DC | PRN
  Administered 2017-09-04: 4 mg via INTRAVENOUS

## 2017-09-04 MED ORDER — LACTATED RINGERS IV SOLN
INTRAVENOUS | Status: DC | PRN
  Administered 2017-09-04: 09:00:00 via INTRAVENOUS

## 2017-09-04 MED ORDER — ONDANSETRON HCL 4 MG/2ML IJ SOLN
INTRAMUSCULAR | Status: AC
  Filled 2017-09-04: qty 2

## 2017-09-04 MED ORDER — MIDAZOLAM HCL 2 MG/2ML IJ SOLN
INTRAMUSCULAR | Status: AC
  Filled 2017-09-04: qty 2

## 2017-09-04 MED ORDER — PROPOFOL 10 MG/ML IV BOLUS
INTRAVENOUS | Status: AC
  Filled 2017-09-04: qty 40

## 2017-09-04 MED ORDER — PROPOFOL 500 MG/50ML IV EMUL
INTRAVENOUS | Status: DC | PRN
  Administered 2017-09-04: 200 ug/kg/min via INTRAVENOUS

## 2017-09-04 MED ORDER — PROPOFOL 10 MG/ML IV BOLUS
INTRAVENOUS | Status: AC
  Filled 2017-09-04: qty 20

## 2017-09-04 MED ORDER — SODIUM CHLORIDE 0.9 % IV SOLN: INTRAVENOUS | Status: DC

## 2017-09-04 MED ORDER — PROPOFOL 500 MG/50ML IV EMUL
INTRAVENOUS | Status: DC | PRN
  Administered 2017-09-04: 50 mg via INTRAVENOUS

## 2017-09-04 SURGICAL SUPPLY — 14 items

## 2017-09-04 NOTE — Anesthesia Postprocedure Evaluation (Signed)
Anesthesia Post Note  Patient: Eric Hood  Procedure(s) Performed: ESOPHAGOGASTRODUODENOSCOPY (EGD) WITH PROPOFOL (N/A ) SAVORY DILATION (N/A )     Patient location during evaluation: PACU Anesthesia Type: General Level of consciousness: awake and alert Pain management: pain level controlled Vital Signs Assessment: post-procedure vital signs reviewed and stable Respiratory status: spontaneous breathing, nonlabored ventilation, respiratory function stable and patient connected to nasal cannula oxygen Cardiovascular status: stable and blood pressure returned to baseline Postop Assessment: no apparent nausea or vomiting Anesthetic complications: no    Last Vitals:  Vitals:   09/04/17 1120 09/04/17 1124  BP: (!) 95/45 105/65  Pulse: 77 79  Resp: 12 10  Temp:    SpO2: 94% 96%    Last Pain:  Vitals:   09/04/17 1106  TempSrc: Oral                 [name] DAVID

## 2017-09-04 NOTE — Op Note (Signed)
[facility] Patient Name: Eric Hood Procedure Date: 09/04/2017 MRN: [id number] Attending MD: Ladene Artist , MD Date of Birth: [birth year] CSN: [id number] Age: 36 Admit Type: Outpatient Procedure:                Upper GI endoscopy Indications:              Dysphagia, Stricture of the esophagus Providers:                Pricilla Riffle. Fuller Plan, MD, Cleda Daub, RN, Elspeth Cho Tech., Technician, Arnoldo Hooker, CRNA Referring MD:              Medicines:                Monitored Anesthesia Care Complications:            No immediate complications. Estimated Blood Loss:     Estimated blood loss was minimal. Procedure:                Pre-Anesthesia Assessment:                           - Prior to the procedure, a History and Physical                            was performed, and patient medications and                            allergies were reviewed. The patient's tolerance of                            previous anesthesia was also reviewed. The risks                            and benefits of the procedure and the sedation                            options and risks were discussed with the patient.                            All questions were answered, and informed consent                            was obtained. Prior Anticoagulants: The patient has                            taken no previous anticoagulant or antiplatelet                            agents. ASA Grade Assessment: II - A patient with                            mild systemic disease. After reviewing the risks  and benefits, the patient was deemed in                            satisfactory condition to undergo the procedure.                           After obtaining informed consent, the endoscope was                            passed under direct vision. Throughout the                            procedure, the patient's blood pressure, pulse, and                        oxygen saturations were monitored continuously. The                            EG-[id number] ([id number]) scope was introduced through the                            mouth, and advanced to the second part of duodenum.                            The upper GI endoscopy was accomplished without                            difficulty. The patient tolerated the procedure                            well. Scope In: Scope Out: Findings:      One severe benign-appearing, intrinsic stenosis was found in the mid       esophagus. This measured 7 mm (inner diameter) x 2 cm (in length) and       was traversed after dilation. A guidewire was placed under fluoroscopic       guidance and the scope was withdrawn. Dilation was performed with a       Savary dilator with no resistance at 8 mm. Estimated blood loss: none.       Dilations were performed with Savary dilators with mild resistance at 9       mm, 10 mm, 11 mm, 12 mm. Estimated blood loss was minimal with the last       2 dilators. The endoscope traversed the stricture after dilation.      LA Grade C (one or more mucosal breaks continuous between tops of 2 or       more mucosal folds, less than 75% circumference) esophagitis with no       bleeding was found in the mid esophagus.      The exam of the esophagus was otherwise normal.      A medium-sized hiatal hernia was present.      The exam of the stomach was otherwise normal.      The duodenal bulb and second portion of the duodenum were normal. Impression:               - Benign-appearing esophageal stenosis. Dilated.                           -  LA Grade C esophagitis.                           - Medium-sized hiatal hernia.                           - Normal duodenal bulb and second portion of the                            duodenum.                           - No specimens collected. Moderate Sedation:      N/A- Per Anesthesia Care Recommendation:           - Patient has a contact  number available for                            emergencies. The signs and symptoms of potential                            delayed complications were discussed with the                            patient. Return to normal activities tomorrow.                            Written discharge instructions were provided to the                            patient.                           - Clear liquid diet for 2 hours, then advance as                            tolerated to soft diet indefinitely.                           - Continue present medications.                           - Repeat upper endoscopy with dilation at                            appointment to be scheduled for retreatment. Procedure Code(s):        --- Professional ---                           [phone], Esophagogastroduodenoscopy, flexible,                            transoral; with insertion of guide wire followed by                            passage of dilator(s) through esophagus over guide  wire Diagnosis Code(s):        --- Professional ---                           K22.2, Esophageal obstruction                           K20.9, Esophagitis, unspecified                           K44.9, Diaphragmatic hernia without obstruction or                            gangrene                           R13.10, Dysphagia, unspecified CPT copyright 2016 American Medical Association. All rights reserved. The codes documented in this report are preliminary and upon coder review may  be revised to meet current compliance requirements. Ladene Artist, MD 09/04/2017 11:00:50 AM This report has been signed electronically. Number of Addenda: 0

## 2017-09-04 NOTE — Anesthesia Preprocedure Evaluation (Signed)
Anesthesia Evaluation  Patient identified by MRN, date of birth, ID band Patient awake    Reviewed: Allergy & Precautions, NPO status , Patient's Chart, lab work & pertinent test results  Airway Mallampati: I  TM Distance: >3 FB Neck ROM: Full    Dental   Pulmonary    Pulmonary exam normal        Cardiovascular Normal cardiovascular exam     Neuro/Psych Anxiety    GI/Hepatic GERD  Medicated and Controlled,  Endo/Other    Renal/GU      Musculoskeletal   Abdominal   Peds  Hematology   Anesthesia Other Findings   Reproductive/Obstetrics                             Anesthesia Physical Anesthesia Plan  ASA: III  Anesthesia Plan: General   Post-op Pain Management:    Induction: Intravenous  PONV Risk Score and Plan: 2 and Dexamethasone and Ondansetron  Airway Management Planned: Simple Face Mask  Additional Equipment:   Intra-op Plan:   Post-operative Plan:   Informed Consent: I have reviewed the patients History and Physical, chart, labs and discussed the procedure including the risks, benefits and alternatives for the proposed anesthesia with the patient or authorized representative who has indicated his/her understanding and acceptance.     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

## 2017-09-04 NOTE — H&P (View-Only) (Signed)
ADMISSION NOTE  Primary Care Physician:  Eulas Post, MD Primary Gastroenterologist:  Lucio Edward, MD  CHIEF COMPLAINT:  Dysphagia, esophageal stricture  HPI: Eric Hood is a 36 y.o. male with persistent dysphagia, a refractory esophageal stricture and LA Class C esophagitis.  EGD with dilation, fluoroscopy is planned.    Past Medical History:  Diagnosis Date  . Anemia 10/23/2014  . Anxiety   . Back pain   . Blood transfusion without reported diagnosis april 2016  . Esophageal stricture 03/2014  . GERD (gastroesophageal reflux disease)   . Hiatal hernia   . Reflux   . Substance abuse ([place])    on methadone  . Ulcerative esophagitis 03/2014   severe    Past Surgical History:  Procedure Laterality Date  . BALLOON DILATION N/A 05/20/2015   Performed by Milus Banister, MD at [facility] ENDOSCOPY  . ESOPHAGEAL DILATION     On 3 occasions: 03/2014, 08/2014, 09/2014  . ESOPHAGOGASTRODUODENOSCOPY (EGD) N/A 05/20/2015   Performed by Milus Banister, MD at [facility] ENDOSCOPY  . ESOPHAGOGASTRODUODENOSCOPY (EGD) N/A 01/18/2015   Performed by Lafayette Dragon, MD at [facility]  . ESOPHAGOGASTRODUODENOSCOPY (EGD) WITH PROPOFOL N/A 01/18/2015   Performed by Lafayette Dragon, MD at [facility]  . HAND SURGERY Right     Prior to Admission medications   Medication Sig Start Date End Date Taking? Authorizing Provider  methadone (DOLOPHINE) 10 MG/ML solution Take 90 mg daily by mouth.    Yes [provider]  omeprazole (PRILOSEC OTC) 20 MG tablet Take 20 mg daily by mouth.   Yes [provider]  ranitidine (ZANTAC) 300 MG tablet Take 1 tablet (300 mg total) by mouth at bedtime. Patient taking differently: Take 150 mg every evening by mouth.  02/23/15  Yes Ladene Artist, MD  esomeprazole (NEXIUM) 40 MG capsule Take 1 capsule (40 mg total) by mouth daily. Patient not taking: Reported on 07/24/2017 10/14/15   Lyndal Pulley, DO  hydrOXYzine (ATARAX/VISTARIL) 10 MG tablet  Take 1-2 tablets (10-20 mg total) by mouth 3 (three) times daily as needed for anxiety. Patient not taking: Reported on 07/04/2017 10/14/15   Lyndal Pulley, DO  pantoprazole (PROTONIX) 40 MG tablet Take 1 tablet (40 mg total) by mouth 2 (two) times daily before a meal. Patient not taking: Reported on 07/24/2017 07/04/17   Levin Erp, PA    No current facility-administered medications for this encounter.     Allergies as of 07/30/2017 - Review Complete 07/24/2017  Allergen Reaction Noted  . Hydrocodone  09/16/2015    Family History  Problem Relation Age of Onset  . Hypertension Mother   . Hypertension Father   . Colon cancer Neg Hx   . Esophageal cancer Neg Hx   . Rectal cancer Neg Hx   . Stomach cancer Neg Hx     Social History   Socioeconomic History  . Marital status: Single    Spouse name: Not on file  . Number of children: Not on file  . Years of education: Not on file  . Highest education level: Not on file  Social Needs  . Financial resource strain: Not on file  . Food insecurity - worry: Not on file  . Food insecurity - inability: Not on file  . Transportation needs - medical: Not on file  . Transportation needs - non-medical: Not on file  Occupational History  . Not on file  Tobacco Use  . Smoking status:  Never Smoker  . Smokeless tobacco: Never Used  Substance and Sexual Activity  . Alcohol use: No  . Drug use: No    Comment: opiate abuse  . Sexual activity: No  Other Topics Concern  . Not on file  Social History Narrative  . Not on file    Review of Systems:  All systems reviewed an negative except where noted in HPI.  Gen: Denies any fever, chills, sweats, anorexia, fatigue, weakness, malaise, weight loss, and sleep disorder CV: Denies chest pain, angina, palpitations, syncope, orthopnea, PND, peripheral edema, and claudication. Resp: Denies dyspnea at rest, dyspnea with exercise, cough, sputum, wheezing, coughing up blood, and  pleurisy. GI: Denies vomiting blood, jaundice, and fecal incontinence.   Denies odynophagia. GU : Denies urinary burning, blood in urine, urinary frequency, urinary hesitancy, nocturnal urination, and urinary incontinence. MS: Denies joint pain, limitation of movement, and swelling, stiffness, low back pain, extremity pain. Denies muscle weakness, cramps, atrophy.  Derm: Denies rash, itching, dry skin, hives, moles, warts, or unhealing ulcers.  Psych: Denies depression, anxiety, memory loss, suicidal ideation, hallucinations, paranoia, and confusion. Heme: Denies bruising, bleeding, and enlarged lymph nodes. Neuro:  Denies any headaches, dizziness, paresthesias. Endo:  Denies any problems with DM, thyroid, adrenal function.  Physical Exam: Vital signs in last 24 hours:     General:  Alert, well-developed, in NAD Head:  Normocephalic and atraumatic. Eyes:  Sclera clear, no icterus.   Conjunctiva pink. Ears:  Normal auditory acuity. Mouth:  No deformity or lesions.  Neck:  Supple; no masses . Lungs:  Clear throughout to auscultation.   No wheezes, crackles, or rhonchi. No acute distress. Heart:  Regular rate and rhythm; no murmurs. Abdomen:  Soft, nondistended, nontender. No masses, hepatomegaly. No obvious masses.  Normal bowel .    Rectal:  Not done Msk:  Symmetrical without gross deformities.. Pulses:  Normal pulses noted. Extremities:  Without edema. Neurologic:  Alert and  oriented x4;  grossly normal neurologically. Skin:  Intact without significant lesions or rashes. Cervical Nodes:  No significant cervical adenopathy. Psych:  Alert and cooperative. Normal mood and affect.   Studies/Results: No results found.  Impression / Plan:   1. Persistent esophageal stricture with dysphagia. For EGD/dilation with fluoroscopy.  Continue pantoprazole 40 mg po bid and ranitidine 300 mg hs and antireflux measures.     LOS: 0 days   Malcolm T. Fuller Plan MD 09/04/2017, 8:38 AM [phone]  Mon-Fri 8a-5p  [phone] after 5p, weekends, holidays

## 2017-09-04 NOTE — H&P (Signed)
ADMISSION NOTE  Primary Care Physician:  Eric Post, MD Primary Gastroenterologist:  Eric Edward, MD  CHIEF COMPLAINT:  Dysphagia, esophageal stricture  HPI: Eric Hood is a 36 y.o. male with persistent dysphagia, a refractory esophageal stricture and LA Class C esophagitis.  EGD with dilation, fluoroscopy is planned.    Past Medical History:  Diagnosis Date  . Anemia 10/23/2014  . Anxiety   . Back pain   . Blood transfusion without reported diagnosis april 2016  . Esophageal stricture 03/2014  . GERD (gastroesophageal reflux disease)   . Hiatal hernia   . Reflux   . 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 Ulcerative esophagitis 03/2014   severe    Past Surgical History:  Procedure Laterality Date  . BALLOON DILATION N/A 05/20/2015   Performed by Eric Banister, MD at [facility] ENDOSCOPY  . ESOPHAGEAL DILATION     On 3 occasions: 03/2014, 08/2014, 09/2014  . ESOPHAGOGASTRODUODENOSCOPY (EGD) N/A 05/20/2015   Performed by Eric Banister, MD at [facility] ENDOSCOPY  . ESOPHAGOGASTRODUODENOSCOPY (EGD) N/A 01/18/2015   Performed by Eric Dragon, MD at [place]  . ESOPHAGOGASTRODUODENOSCOPY (EGD) WITH PROPOFOL N/A 01/18/2015   Performed by Eric Dragon, MD at [place]  . HAND SURGERY Right     Prior to Admission medications   Medication Sig Start Date End Date Taking? Authorizing Provider  methadone (DOLOPHINE) 10 MG/ML solution Take 90 mg daily by mouth.    Yes [provider]  omeprazole (PRILOSEC OTC) 20 MG tablet Take 20 mg daily by mouth.   Yes [provider]  ranitidine (ZANTAC) 300 MG tablet Take 1 tablet (300 mg total) by mouth at bedtime. Patient taking differently: Take 150 mg every evening by mouth.  02/23/15  Yes Ladene Artist, MD  esomeprazole (NEXIUM) 40 MG capsule Take 1 capsule (40 mg total) by mouth daily. Patient not taking: Reported on 07/24/2017 10/14/15   Eric Pulley, DO  hydrOXYzine (ATARAX/VISTARIL) 10 MG tablet  Take 1-2 tablets (10-20 mg total) by mouth 3 (three) times daily as needed for anxiety. Patient not taking: Reported on 07/04/2017 10/14/15   Eric Pulley, DO  pantoprazole (PROTONIX) 40 MG tablet Take 1 tablet (40 mg total) by mouth 2 (two) times daily before a meal. Patient not taking: Reported on 07/24/2017 07/04/17   Eric Erp, PA    No current facility-administered medications for this encounter.     Allergies as of 07/30/2017 - Review Complete 07/24/2017  Allergen Reaction Noted  . Hydrocodone  09/16/2015    Family History  Problem Relation Age of Onset  . Hypertension Mother   . Hypertension Father   . Colon cancer Neg Hx   . Esophageal cancer Neg Hx   . Rectal cancer Neg Hx   . Stomach cancer Neg Hx     Social History   Socioeconomic History  . Marital status: Single    Spouse name: Not on file  . Number of children: Not on file  . Years of education: Not on file  . Highest education level: Not on file  Social Needs  . Financial resource strain: Not on file  . Food insecurity - worry: Not on file  . Food insecurity - inability: Not on file  . Transportation needs - medical: Not on file  . Transportation needs - non-medical: Not on file  Occupational History  . Not on file  Tobacco Use  . Smoking status:  Never Smoker  . Smokeless tobacco: Never Used  Substance and Sexual Activity  . Alcohol use: No  . Drug use: No    Comment: opiate abuse  . Sexual activity: No  Other Topics Concern  . Not on file  Social History Narrative  . Not on file    Review of Systems:  All systems reviewed an negative except where noted in HPI.  Gen: Denies any fever, chills, sweats, anorexia, fatigue, weakness, malaise, weight loss, and sleep disorder CV: Denies chest pain, angina, palpitations, syncope, orthopnea, PND, peripheral edema, and claudication. Resp: Denies dyspnea at rest, dyspnea with exercise, cough, sputum, wheezing, coughing up blood, and  pleurisy. GI: Denies vomiting blood, jaundice, and fecal incontinence.   Denies odynophagia. GU : Denies urinary burning, blood in urine, urinary frequency, urinary hesitancy, nocturnal urination, and urinary incontinence. MS: Denies joint pain, limitation of movement, and swelling, stiffness, low back pain, extremity pain. Denies muscle weakness, cramps, atrophy.  Derm: Denies rash, itching, dry skin, hives, moles, warts, or unhealing ulcers.  Psych: Denies depression, anxiety, memory loss, suicidal ideation, hallucinations, paranoia, and confusion. Heme: Denies bruising, bleeding, and enlarged lymph nodes. Neuro:  Denies any headaches, dizziness, paresthesias. Endo:  Denies any problems with DM, thyroid, adrenal function.  Physical Exam: Vital signs in last 24 hours:     General:  Alert, well-developed, in NAD Head:  Normocephalic and atraumatic. Eyes:  Sclera clear, no icterus.   Conjunctiva pink. Ears:  Normal auditory acuity. Mouth:  No deformity or lesions.  Neck:  Supple; no masses . Lungs:  Clear throughout to auscultation.   No wheezes, crackles, or rhonchi. No acute distress. Heart:  Regular rate and rhythm; no murmurs. Abdomen:  Soft, nondistended, nontender. No masses, hepatomegaly. No obvious masses.  Normal bowel .    Rectal:  Not done Msk:  Symmetrical without gross deformities.. Pulses:  Normal pulses noted. Extremities:  Without edema. Neurologic:  Alert and  oriented x4;  grossly normal neurologically. Skin:  Intact without significant lesions or rashes. Cervical Nodes:  No significant cervical adenopathy. Psych:  Alert and cooperative. Normal mood and affect.   Studies/Results: No results found.  Impression / Plan:   1. Persistent esophageal stricture with dysphagia. For EGD/dilation with fluoroscopy.  Continue pantoprazole 40 mg po bid and ranitidine 300 mg hs and antireflux measures.     LOS: 0 days   Eric Hood Plan MD 09/04/2017, 8:38 AM [phone]  Mon-Fri 8a-5p  [phone] after 5p, weekends, holidays

## 2017-09-04 NOTE — Transfer of Care (Signed)
Immediate Anesthesia Transfer of Care Note  Patient: Eric Hood  Procedure(s) Performed: ESOPHAGOGASTRODUODENOSCOPY (EGD) WITH PROPOFOL (N/A ) SAVORY DILATION (N/A )  Patient Location: PACU  Anesthesia Type:MAC  Level of Consciousness: awake, alert  and oriented  Airway & Oxygen Therapy: Patient Spontanous Breathing and Patient connected to nasal cannula oxygen  Post-op Assessment: Report given to RN  Post vital signs: Reviewed and stable  Last Vitals:  Vitals:   09/04/17 0858  BP: 122/76  Pulse: 87  Resp: 10  Temp: 36.9 C  SpO2: 94%    Last Pain:  Vitals:   09/04/17 0858  TempSrc: Oral         Complications: No apparent anesthesia complications

## 2017-09-04 NOTE — Discharge Instructions (Signed)
YOU HAD AN ENDOSCOPIC PROCEDURE TODAY: Refer to the procedure report and other information in the discharge instructions given to you for any specific questions about what was found during the examination. If this information does not answer your questions, please call [place] office at [phone] to clarify.  ° °YOU SHOULD EXPECT: Some feelings of bloating in the abdomen. Passage of more gas than usual. Walking can help get rid of the air that was put into your GI tract during the procedure and reduce the bloating. If you had a lower endoscopy (such as a colonoscopy or flexible sigmoidoscopy) you may notice spotting of blood in your stool or on the toilet paper. Some abdominal soreness may be present for a day or two, also. ° °DIET: Your first meal following the procedure should be a light meal and then it is ok to progress to your normal diet. A half-sandwich or bowl of soup is an example of a good first meal. Heavy or fried foods are harder to digest and may make you feel nauseous or bloated. Drink plenty of fluids but you should avoid alcoholic beverages for 24 hours. If you had a esophageal dilation, please see attached instructions for diet.   ° °ACTIVITY: Your care partner should take you home directly after the procedure. You should plan to take it easy, moving slowly for the rest of the day. You can resume normal activity the day after the procedure however YOU SHOULD NOT DRIVE, use power tools, machinery or perform tasks that involve climbing or major physical exertion for 24 hours (because of the sedation medicines used during the test).  ° °SYMPTOMS TO REPORT IMMEDIATELY: °A gastroenterologist can be reached at any hour. Please call [phone]  for any of the following symptoms:  °Following lower endoscopy (colonoscopy, flexible sigmoidoscopy) °Excessive amounts of blood in the stool  °Significant tenderness, worsening of abdominal pains  °Swelling of the abdomen that is new, acute  °Fever of 100° or  higher  °Following upper endoscopy (EGD, EUS, ERCP, esophageal dilation) °Vomiting of blood or coffee ground material  °New, significant abdominal pain  °New, significant chest pain or pain under the shoulder blades  °Painful or persistently difficult swallowing  °New shortness of breath  °Black, tarry-looking or red, bloody stools ° °FOLLOW UP:  °If any biopsies were taken you will be contacted by phone or by letter within the next 1-3 weeks. Call [phone]  if you have not heard about the biopsies in 3 weeks.  °Please also call with any specific questions about appointments or follow up tests. ° °

## 2017-09-05 ENCOUNTER — Encounter (HOSPITAL_COMMUNITY): Payer: Self-pay | Admitting: Gastroenterology

## 2017-09-10 ENCOUNTER — Other Ambulatory Visit: Payer: Self-pay

## 2017-09-10 ENCOUNTER — Telehealth: Payer: Self-pay

## 2017-09-10 DIAGNOSIS — K222 Esophageal obstruction: Secondary | ICD-10-CM

## 2017-09-10 NOTE — Telephone Encounter (Signed)
Informed patient repeat EGD with dilation is on 10/01/17 at 10:00am, arriving at 8:30am at [facility] Informed patient that I will mail him EGD instructions and to call our office if he receives the instructions in the mail and he has questions. Also, if he does not received the instructions in mail, to call our office. Patient verbalized understanding.

## 2017-09-10 NOTE — Telephone Encounter (Signed)
-----   Message from Ladene Artist, MD sent at 09/04/2017 11:04 AM EST ----- Had EGD/dilation and fluoroscopy today which needs repeat in the hospital in about 1 month. I have hospital OP time on 12/17.

## 2017-09-25 ENCOUNTER — Encounter (HOSPITAL_COMMUNITY): Payer: Self-pay | Admitting: Emergency Medicine

## 2017-09-25 ENCOUNTER — Other Ambulatory Visit: Payer: Self-pay

## 2017-10-01 ENCOUNTER — Ambulatory Visit (HOSPITAL_COMMUNITY): Payer: Self-pay | Admitting: Anesthesiology

## 2017-10-01 ENCOUNTER — Other Ambulatory Visit: Payer: Self-pay

## 2017-10-01 ENCOUNTER — Encounter (HOSPITAL_COMMUNITY): Payer: Self-pay

## 2017-10-01 ENCOUNTER — Ambulatory Visit (HOSPITAL_COMMUNITY)
Admission: RE | Admit: 2017-10-01 | Discharge: 2017-10-01 | Disposition: A | Discharge status: Home / Self Care | Payer: Self-pay | Source: Ambulatory Visit | Attending: Gastroenterology | Admitting: Gastroenterology

## 2017-10-01 ENCOUNTER — Encounter (HOSPITAL_COMMUNITY)
Admission: RE | Disposition: A | Discharge status: Home / Self Care | Payer: Self-pay | Source: Ambulatory Visit | Attending: Gastroenterology

## 2017-10-01 DIAGNOSIS — K222 Esophageal obstruction: Secondary | ICD-10-CM | POA: Insufficient documentation

## 2017-10-01 DIAGNOSIS — F419 Anxiety disorder, unspecified: Secondary | ICD-10-CM | POA: Insufficient documentation

## 2017-10-01 DIAGNOSIS — Z79899 Other long term (current) drug therapy: Secondary | ICD-10-CM | POA: Insufficient documentation

## 2017-10-01 DIAGNOSIS — K210 Gastro-esophageal reflux disease with esophagitis: Secondary | ICD-10-CM | POA: Insufficient documentation

## 2017-10-01 DIAGNOSIS — K449 Diaphragmatic hernia without obstruction or gangrene: Secondary | ICD-10-CM | POA: Insufficient documentation

## 2017-10-01 HISTORY — PX: ESOPHAGOGASTRODUODENOSCOPY (EGD) WITH PROPOFOL: SHX5813

## 2017-10-01 HISTORY — PX: SAVORY DILATION: SHX5439

## 2017-10-01 SURGERY — ESOPHAGOGASTRODUODENOSCOPY (EGD) WITH PROPOFOL
Anesthesia: Monitor Anesthesia Care

## 2017-10-01 MED ORDER — PROPOFOL 500 MG/50ML IV EMUL
INTRAVENOUS | Status: DC | PRN
  Administered 2017-10-01: 150 ug/kg/min via INTRAVENOUS

## 2017-10-01 MED ORDER — LACTATED RINGERS IV SOLN
INTRAVENOUS | Status: DC
  Administered 2017-10-01: 10:00:00 via INTRAVENOUS

## 2017-10-01 MED ORDER — PROPOFOL 10 MG/ML IV BOLUS
INTRAVENOUS | Status: AC
  Filled 2017-10-01: qty 20

## 2017-10-01 MED ORDER — LIDOCAINE 2% (20 MG/ML) 5 ML SYRINGE
INTRAMUSCULAR | Status: DC | PRN
  Administered 2017-10-01: 100 mg via INTRAVENOUS

## 2017-10-01 MED ORDER — PROPOFOL 10 MG/ML IV BOLUS
INTRAVENOUS | Status: DC | PRN
  Administered 2017-10-01 (×2): 30 mg via INTRAVENOUS

## 2017-10-01 SURGICAL SUPPLY — 14 items

## 2017-10-01 NOTE — Anesthesia Postprocedure Evaluation (Signed)
Anesthesia Post Note  Patient: Eric Hood  Procedure(s) Performed: ESOPHAGOGASTRODUODENOSCOPY (EGD) WITH PROPOFOL (N/A ) SAVORY DILATION (N/A )     Patient location during evaluation: PACU Anesthesia Type: MAC Level of consciousness: awake Pain management: pain level controlled Vital Signs Assessment: post-procedure vital signs reviewed and stable Respiratory status: spontaneous breathing Cardiovascular status: stable Postop Assessment: no apparent nausea or vomiting Anesthetic complications: no    Last Vitals:  Vitals:   10/01/17 1105 10/01/17 1110  BP:  108/81  Pulse:  77  Resp:  11  Temp:    SpO2: 99% 98%    Last Pain:  Vitals:   10/01/17 1048  TempSrc: Oral   Pain Goal:                 Salah Burlison JR,JOHN Novalie Leamy

## 2017-10-01 NOTE — Anesthesia Preprocedure Evaluation (Signed)
Anesthesia Evaluation  Patient identified by MRN, date of birth, ID band Patient awake    Reviewed: Allergy & Precautions, NPO status , Patient's Chart, lab work & pertinent test results  Airway Mallampati: I  TM Distance: >3 FB Neck ROM: Full    Dental no notable dental hx. (+) Teeth Intact   Pulmonary    Pulmonary exam normal breath sounds clear to auscultation       Cardiovascular Normal cardiovascular exam Rhythm:Regular Rate:Normal     Neuro/Psych PSYCHIATRIC DISORDERS Anxiety    GI/Hepatic Neg liver ROS, GERD  Medicated and Controlled,  Endo/Other  negative endocrine ROS  Renal/GU negative Renal ROS  negative genitourinary   Musculoskeletal negative musculoskeletal ROS (+)   Abdominal Normal abdominal exam  (+)   Peds  Hematology   Anesthesia Other Findings   Reproductive/Obstetrics                             Anesthesia Physical  Anesthesia Plan  ASA: III  Anesthesia Plan: MAC   Post-op Pain Management:    Induction: Intravenous  PONV Risk Score and Plan: 2 and Dexamethasone and Ondansetron  Airway Management Planned: Simple Face Mask  Additional Equipment:   Intra-op Plan:   Post-operative Plan:   Informed Consent: I have reviewed the patients History and Physical, chart, labs and discussed the procedure including the risks, benefits and alternatives for the proposed anesthesia with the patient or authorized representative who has indicated his/her understanding and acceptance.     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

## 2017-10-01 NOTE — Op Note (Signed)
[facility] Patient Name: Eric Hood Procedure Date: 10/01/2017 MRN: [id number] Attending MD: Ladene Artist , MD Date of Birth: Nov 09, 1980 CSN: [id number] Age: 36 Admit Type: Outpatient Procedure:                Upper GI endoscopy Indications:              Dysphagia, Stricture of the esophagus Providers:                Pricilla Riffle. Fuller Plan, MD, Jobe Igo, RN, Elspeth Cho Tech., Technician, [facility],                            CRNA Referring MD:              Medicines:                Monitored Anesthesia Care Complications:            No immediate complications. Estimated Blood Loss:     Estimated blood loss: none. Estimated blood loss                            was minimal. Estimated blood loss was minimal. Procedure:                Pre-Anesthesia Assessment:                           - Prior to the procedure, a History and Physical                            was performed, and patient medications and                            allergies were reviewed. The patient's tolerance of                            previous anesthesia was also reviewed. The risks                            and benefits of the procedure and the sedation                            options and risks were discussed with the patient.                            All questions were answered, and informed consent                            was obtained. Prior Anticoagulants: The patient has                            taken no previous anticoagulant or antiplatelet  agents. ASA Grade Assessment: II - A patient with                            mild systemic disease. After reviewing the risks                            and benefits, the patient was deemed in                            satisfactory condition to undergo the procedure.                           After obtaining informed consent, the endoscope was   passed under direct vision. Throughout the                            procedure, the patient's blood pressure, pulse, and                            oxygen saturations were monitored continuously. The                            Endoscope was introduced through the mouth, and                            advanced to the second part of duodenum. The upper                            GI endoscopy was accomplished without difficulty.                            The patient tolerated the procedure well. Although                            fluoroscopy was availabe it was no used. Scope In: Scope Out: Findings:      LA Grade C (one or more mucosal breaks continuous between tops of 2 or       more mucosal folds, less than 75% circumference) esophagitis with no       bleeding was found in the distal esophagus.      One moderate benign-appearing, intrinsic stenosis was found in the mid       esophagus. This measured 1 cm (inner diameter) x 2 cm (in length) and       was traversed. A guidewire was placed and the scope was withdrawn.       Dilations were performed with Savary dilators with no resistance at 11       mm, 12 mm. Estimated blood loss was minimal. Dilations were performed       with Savary dilators with mild resistance at 12.8 mm, 14 mm and 15 mm.       Estimated blood loss was minimal.      The exam of the esophagus was otherwise normal.      A medium-sized hiatal hernia was present.      The exam of the stomach was otherwise normal.  The duodenal bulb and second portion of the duodenum were normal. Impression:               - LA Grade C reflux esophagitis.                           - Benign-appearing esophageal stenosis. Dilated.                           - Medium-sized hiatal hernia.                           - Normal duodenal bulb and second portion of the                            duodenum.                           - No specimens collected. Moderate Sedation:      N/A- Per  Anesthesia Care Recommendation:           - Patient has a contact number available for                            emergencies. The signs and symptoms of potential                            delayed complications were discussed with the                            patient. Return to normal activities tomorrow.                            Written discharge instructions were provided to the                            patient.                           - Clear liquid diet for 2 hours, then soft diet                            today. Advance as tolerated.                           - Continue present medications.                           - Repeat upper endoscopy in 1 month in [place] to                            repeat dilation. Procedure Code(s):        --- Professional ---                           [phone], Esophagogastroduodenoscopy, flexible,  transoral; with insertion of guide wire followed by                            passage of dilator(s) through esophagus over guide                            wire Diagnosis Code(s):        --- Professional ---                           K21.0, Gastro-esophageal reflux disease with                            esophagitis                           K22.2, Esophageal obstruction                           K44.9, Diaphragmatic hernia without obstruction or                            gangrene                           R13.10, Dysphagia, unspecified CPT copyright 2016 American Medical Association. All rights reserved. The codes documented in this report are preliminary and upon coder review may  be revised to meet current compliance requirements. Ladene Artist, MD 10/01/2017 11:07:55 AM This report has been signed electronically. Number of Addenda: 0

## 2017-10-01 NOTE — Interval H&P Note (Signed)
History and Physical Interval Note:  10/01/2017 10:15 AM  Eric Hood  has presented today for surgery, with the diagnosis of Esophageal stricture  The various methods of treatment have been discussed with the patient and family. After consideration of risks, benefits and other options for treatment, the patient has consented to  Procedure(s): ESOPHAGOGASTRODUODENOSCOPY (EGD) WITH PROPOFOL (N/A) SAVORY DILATION (N/A) as a surgical intervention .  The patient's history has been reviewed, patient examined, no change in status, stable for surgery.  I have reviewed the patient's chart and labs.  Questions were answered to the patient's satisfaction.     Pricilla Riffle. Fuller Plan

## 2017-10-01 NOTE — Discharge Instructions (Signed)
YOU HAD AN ENDOSCOPIC PROCEDURE TODAY: Refer to the procedure report and other information in the discharge instructions given to you for any specific questions about what was found during the examination. If this information does not answer your questions, please call [place] office at [phone] to clarify.  ° °YOU SHOULD EXPECT: Some feelings of bloating in the abdomen. Passage of more gas than usual. Walking can help get rid of the air that was put into your GI tract during the procedure and reduce the bloating. If you had a lower endoscopy (such as a colonoscopy or flexible sigmoidoscopy) you may notice spotting of blood in your stool or on the toilet paper. Some abdominal soreness may be present for a day or two, also. ° °DIET: Your first meal following the procedure should be a light meal and then it is ok to progress to your normal diet. A half-sandwich or bowl of soup is an example of a good first meal. Heavy or fried foods are harder to digest and may make you feel nauseous or bloated. Drink plenty of fluids but you should avoid alcoholic beverages for 24 hours. If you had a esophageal dilation, please see attached instructions for diet.   ° °ACTIVITY: Your care partner should take you home directly after the procedure. You should plan to take it easy, moving slowly for the rest of the day. You can resume normal activity the day after the procedure however YOU SHOULD NOT DRIVE, use power tools, machinery or perform tasks that involve climbing or major physical exertion for 24 hours (because of the sedation medicines used during the test).  ° °SYMPTOMS TO REPORT IMMEDIATELY: °A gastroenterologist can be reached at any hour. Please call [phone]  for any of the following symptoms:  °Following lower endoscopy (colonoscopy, flexible sigmoidoscopy) °Excessive amounts of blood in the stool  °Significant tenderness, worsening of abdominal pains  °Swelling of the abdomen that is new, acute  °Fever of 100° or  higher  °Following upper endoscopy (EGD, EUS, ERCP, esophageal dilation) °Vomiting of blood or coffee ground material  °New, significant abdominal pain  °New, significant chest pain or pain under the shoulder blades  °Painful or persistently difficult swallowing  °New shortness of breath  °Black, tarry-looking or red, bloody stools ° °FOLLOW UP:  °If any biopsies were taken you will be contacted by phone or by letter within the next 1-3 weeks. Call [phone]  if you have not heard about the biopsies in 3 weeks.  °Please also call with any specific questions about appointments or follow up tests. ° °

## 2017-10-01 NOTE — Transfer of Care (Signed)
Immediate Anesthesia Transfer of Care Note  Patient: Eric Hood  Procedure(s) Performed: ESOPHAGOGASTRODUODENOSCOPY (EGD) WITH PROPOFOL (N/A ) SAVORY DILATION (N/A )  Patient Location: PACU and Endoscopy Unit  Anesthesia Type:MAC  Level of Consciousness: awake and patient cooperative  Airway & Oxygen Therapy: Patient Spontanous Breathing and Patient connected to nasal cannula oxygen  Post-op Assessment: Report given to RN and Post -op Vital signs reviewed and stable  Post vital signs: Reviewed and stable  Last Vitals:  Vitals:   10/01/17 0922  BP: 124/77  Pulse: 87  Resp: 13  Temp: 37.2 C  SpO2: 98%    Last Pain:  Vitals:   10/01/17 0922  TempSrc: Oral         Complications: No apparent anesthesia complications

## 2017-10-03 ENCOUNTER — Encounter (HOSPITAL_COMMUNITY): Payer: Self-pay | Admitting: Gastroenterology

## 2017-10-23 ENCOUNTER — Encounter (HOSPITAL_COMMUNITY): Payer: Self-pay | Admitting: Emergency Medicine

## 2017-10-23 ENCOUNTER — Emergency Department (HOSPITAL_COMMUNITY)
Admission: EM | Admit: 2017-10-23 | Discharge: 2017-10-23 | Disposition: A | Discharge status: Home / Self Care | Payer: Self-pay | Attending: Emergency Medicine | Admitting: Emergency Medicine

## 2017-10-23 ENCOUNTER — Other Ambulatory Visit: Payer: Self-pay

## 2017-10-23 DIAGNOSIS — F419 Anxiety disorder, unspecified: Secondary | ICD-10-CM | POA: Insufficient documentation

## 2017-10-23 DIAGNOSIS — Z79899 Other long term (current) drug therapy: Secondary | ICD-10-CM | POA: Insufficient documentation

## 2017-10-23 DIAGNOSIS — D649 Anemia, unspecified: Secondary | ICD-10-CM | POA: Insufficient documentation

## 2017-10-23 DIAGNOSIS — R112 Nausea with vomiting, unspecified: Secondary | ICD-10-CM | POA: Insufficient documentation

## 2017-10-23 LAB — ETHANOL: Alcohol, Ethyl (B): <10 mg/dL (ref <10)

## 2017-10-23 LAB — COMPREHENSIVE METABOLIC PANEL WITH GFR
ALT: 15 U/L — ABNORMAL LOW (ref 17–63)
AST: 20 U/L (ref 15–41)
Albumin: 3.2 g/dL — ABNORMAL LOW (ref 3.5–5.0)
Alkaline Phosphatase: 77 U/L (ref 38–126)
Anion gap: 5 (ref 5–15)
BUN: 18 mg/dL (ref 6–20)
CO2: 27 mmol/L (ref 22–32)
Calcium: 8.5 mg/dL — ABNORMAL LOW (ref 8.9–10.3)
Chloride: 106 mmol/L (ref 101–111)
Creatinine, Ser: 0.97 mg/dL (ref 0.61–1.24)
GFR calc Af Amer: >60 mL/min (ref >60)
GFR calc non Af Amer: >60 mL/min (ref >60)
Glucose, Bld: 104 mg/dL — ABNORMAL HIGH (ref 65–99)
Potassium: 4.2 mmol/L (ref 3.5–5.1)
Sodium: 138 mmol/L (ref 135–145)
Total Bilirubin: 0.4 mg/dL (ref 0.3–1.2)
Total Protein: 6.3 g/dL — ABNORMAL LOW (ref 6.5–8.1)

## 2017-10-23 LAB — TYPE AND SCREEN
ABO/RH(D): O POS
Antibody Screen: NEGATIVE

## 2017-10-23 LAB — POC OCCULT BLOOD, ED: Fecal Occult Bld: NEGATIVE

## 2017-10-23 LAB — CBC
HCT: 33.2 % — ABNORMAL LOW (ref 39.0–52.0)
Hemoglobin: 9.7 g/dL — ABNORMAL LOW (ref 13.0–17.0)
MCH: 21.5 pg — ABNORMAL LOW (ref 26.0–34.0)
MCHC: 29.2 g/dL — ABNORMAL LOW (ref 30.0–36.0)
MCV: 73.5 fL — ABNORMAL LOW (ref 78.0–100.0)
Platelets: 422 K/uL — ABNORMAL HIGH (ref 150–400)
RBC: 4.52 MIL/uL (ref 4.22–5.81)
RDW: 17.1 % — ABNORMAL HIGH (ref 11.5–15.5)
WBC: 7 K/uL (ref 4.0–10.5)

## 2017-10-23 LAB — PROTIME-INR
INR: 1.01
Prothrombin Time: 13.2 s (ref 11.4–15.2)

## 2017-10-23 LAB — ABO/RH: ABO/RH(D): O POS

## 2017-10-23 MED ORDER — ONDANSETRON 8 MG PO TBDP: 8.0000 mg | ORAL_TABLET | Freq: Three times a day (TID) | ORAL | 0 refills | Status: DC | PRN

## 2017-10-23 MED ORDER — ONDANSETRON HCL 4 MG/2ML IJ SOLN
4.0000 mg | Freq: Once | INTRAMUSCULAR | Status: AC
  Administered 2017-10-23: 4 mg via INTRAVENOUS
  Filled 2017-10-23: qty 2

## 2017-10-23 MED ORDER — SODIUM CHLORIDE 0.9 % IV SOLN
80.0000 mg | Freq: Once | INTRAVENOUS | Status: AC
  Administered 2017-10-23: 08:00:00 80 mg via INTRAVENOUS
  Filled 2017-10-23: qty 80

## 2017-10-23 MED ORDER — SODIUM CHLORIDE 0.9 % IV BOLUS (SEPSIS)
1000.0000 mL | Freq: Once | INTRAVENOUS | Status: AC
  Administered 2017-10-23: 1000 mL via INTRAVENOUS

## 2017-10-23 MED ORDER — METHADONE HCL 10 MG/ML PO CONC
90.0000 mg | Freq: Every day | ORAL | Status: DC
  Administered 2017-10-23: 90 mg via ORAL
  Filled 2017-10-23: qty 9

## 2017-10-23 NOTE — ED Triage Notes (Signed)
Pt reports having vomiting coffee ground emesis that started around 0200 along with generalized weakness. Pt reports having history of blood transfusions.

## 2017-10-23 NOTE — ED Notes (Signed)
Occult card at bedside 

## 2017-10-23 NOTE — ED Provider Notes (Addendum)
[facility] DEPT Provider Note   CSN: [id number] Arrival date & time: 10/23/17  1497     History   Chief Complaint Chief Complaint  Patient presents with  . GI Bleeding    HPI Eric Hood is a 37 y.o. male.  Pt has history of esophagitis and history of GI bleeding.  No clear source of bleeding per the patient but presumed to be from the esophagus.   Denies nsaid use.   The history is provided by the patient.  Emesis   This is a new problem. Episode onset: woke up last night vomiting. The problem occurs more than 10 times per day. The problem has not changed since onset.There has been no fever. Pertinent negatives include no chills, no diarrhea and no fever.    Past Medical History:  Diagnosis Date  . Anemia 10/23/2014  . Anxiety   . Back pain   . Blood transfusion without reported diagnosis april 2016  . Esophageal stricture 03/2014  . GERD (gastroesophageal reflux disease)   . Hiatal hernia   . Reflux   . 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 Ulcerative esophagitis 03/2014   severe    Patient Active Problem List   Diagnosis Date Noted  . Dysphagia   . Thoracic compression fracture ([place]) 10/14/2015  . Trigger point of thoracic region 09/03/2015  . Acute lung injury   . Acute respiratory failure with hypoxia ([place])   . Respiratory distress   . Respiratory failure ([place]) 05/15/2015  . Acute respiratory failure with hypoxemia ([place])   . Aspiration pneumonia ([place])   . Acute encephalopathy   . Narcotic overdose ([place])   . Loss of weight 02/10/2015  . Scaphoid fracture of wrist 01/20/2015  . Acute esophagitis   . Esophageal stricture   . Food impaction of esophagus   . Acute right lower quadrant pain 11/13/2014  . Anemia 10/23/2014  . Heme positive stool 10/23/2014  . GERD (gastroesophageal reflux disease) 10/23/2014  . Right-sided chest/RUQ pain 10/23/2014  . History of esophagitis and stricture 10/23/2014  . Anxiety state,  unspecified 06/15/2014  . Scapular dysfunction 03/29/2014  . Nonallopathic lesion of thoracic region 03/29/2014  . Nonallopathic lesion of cervical region 03/29/2014  . Nonallopathic lesion-rib cage 03/29/2014  . Nonallopathic lesion of sacral region 03/29/2014  . Nonallopathic lesion of lumbosacral region 03/29/2014  . Dysplastic nevus of trunk 03/07/2013  . Thoracic back pain 02/11/2013    Past Surgical History:  Procedure Laterality Date  . BALLOON DILATION N/A 05/20/2015   Procedure: BALLOON DILATION;  Surgeon: Milus Banister, MD;  Location: [place];  Service: Endoscopy;  Laterality: N/A;  . ESOPHAGEAL DILATION     On 3 occasions: 03/2014, 08/2014, 09/2014  . ESOPHAGOGASTRODUODENOSCOPY N/A 05/20/2015   Procedure: ESOPHAGOGASTRODUODENOSCOPY (EGD);  Surgeon: Milus Banister, MD;  Location: [place];  Service: Endoscopy;  Laterality: N/A;  . ESOPHAGOGASTRODUODENOSCOPY (EGD) WITH PROPOFOL N/A 01/18/2015   Procedure: ESOPHAGOGASTRODUODENOSCOPY (EGD) WITH PROPOFOL;  Surgeon: Lafayette Dragon, MD;  Location: [facility];  Service: Endoscopy;  Laterality: N/A;  . ESOPHAGOGASTRODUODENOSCOPY (EGD) WITH PROPOFOL N/A 09/04/2017   Procedure: ESOPHAGOGASTRODUODENOSCOPY (EGD) WITH PROPOFOL;  Surgeon: Ladene Artist, MD;  Location: [facility];  Service: Endoscopy;  Laterality: N/A;  need fluro  . ESOPHAGOGASTRODUODENOSCOPY (EGD) WITH PROPOFOL N/A 10/01/2017   Procedure: ESOPHAGOGASTRODUODENOSCOPY (EGD) WITH PROPOFOL;  Surgeon: Ladene Artist, MD;  Location: [facility];  Service: Endoscopy;  Laterality: N/A;  . HAND SURGERY Right   .  SAVORY DILATION N/A 09/04/2017   Procedure: SAVORY DILATION;  Surgeon: Ladene Artist, MD;  Location: [facility];  Service: Endoscopy;  Laterality: N/A;  . SAVORY DILATION N/A 10/01/2017   Procedure: SAVORY DILATION;  Surgeon: Ladene Artist, MD;  Location: [facility];  Service: Endoscopy;  Laterality: N/A;       Home Medications    Prior to  Admission medications   Medication Sig Start Date End Date Taking? Authorizing Provider  methadone (DOLOPHINE) 10 MG/ML solution Take 90 mg daily by mouth.    Yes [provider]  ranitidine (ZANTAC) 300 MG tablet Take 1 tablet (300 mg total) by mouth at bedtime. Patient taking differently: Take 150 mg by mouth at bedtime.  02/23/15  Yes Ladene Artist, MD  ondansetron (ZOFRAN ODT) 8 MG disintegrating tablet Take 1 tablet (8 mg total) by mouth every 8 (eight) hours as needed for nausea or vomiting. 10/23/17   Dorie Rank, MD  pantoprazole (PROTONIX) 40 MG tablet Take 1 tablet (40 mg total) by mouth 2 (two) times daily before a meal. Patient not taking: Reported on 07/24/2017 07/04/17   Levin Erp, PA    Family History Family History  Problem Relation Age of Onset  . Hypertension Mother   . Hypertension Father   . Colon cancer Neg Hx   . Esophageal cancer Neg Hx   . Rectal cancer Neg Hx   . Stomach cancer Neg Hx     Social History Social History   Tobacco Use  . Smoking status: Never Smoker  . Smokeless tobacco: Never Used  Substance Use Topics  . Alcohol use: No  . Drug use: No    Comment: opiate abuse     Allergies   Hydrocodone   Review of Systems Review of Systems  Constitutional: Negative for chills and fever.  Gastrointestinal: Positive for vomiting. Negative for diarrhea.  All other systems reviewed and are negative.    Physical Exam Updated Vital Signs BP 113/72 (BP Location: Left Arm)   Pulse 90   Temp 98 F (36.7 C) (Oral)   Resp 16   Ht 1.829 m (6')   Wt 83.9 kg (185 lb)   SpO2 95%   BMI 25.09 kg/m   Physical Exam  Constitutional: He appears well-developed and well-nourished. No distress.  HENT:  Head: Normocephalic and atraumatic.  Right Ear: External ear normal.  Left Ear: External ear normal.  Eyes: Conjunctivae are normal. Right eye exhibits no discharge. Left eye exhibits no discharge. No scleral icterus.  Neck: Neck  supple. No tracheal deviation present.  Cardiovascular: Normal rate, regular rhythm and intact distal pulses.  Pulmonary/Chest: Effort normal and breath sounds normal. No stridor. No respiratory distress. He has no wheezes. He has no rales.  Abdominal: Soft. Bowel sounds are normal. He exhibits no distension. There is no tenderness. There is no rebound and no guarding.  Genitourinary:  Genitourinary Comments: Brown stool  Musculoskeletal: He exhibits no edema or tenderness.  Neurological: He is alert. He has normal strength. No cranial nerve deficit (no facial droop, extraocular movements intact, no slurred speech) or sensory deficit. He exhibits normal muscle tone. He displays no seizure activity. Coordination normal.  Skin: Skin is warm and dry. No rash noted.  Psychiatric: He has a normal mood and affect.  Nursing note and vitals reviewed.    ED Treatments / Results  Labs (all labs ordered are listed, but only abnormal results are displayed) Labs Reviewed  COMPREHENSIVE METABOLIC PANEL - Abnormal;  Notable for the following components:      Result Value   Glucose, Bld 104 (*)    Calcium 8.5 (*)    Total Protein 6.3 (*)    Albumin 3.2 (*)    ALT 15 (*)    All other components within normal limits  CBC - Abnormal; Notable for the following components:   Hemoglobin 9.7 (*)    HCT 33.2 (*)    MCV 73.5 (*)    MCH 21.5 (*)    MCHC 29.2 (*)    RDW 17.1 (*)    Platelets 422 (*)    All other components within normal limits  PROTIME-INR  ETHANOL  OCCULT BLOOD GASTRIC / DUODENUM (SPECIMEN CUP)  POC OCCULT BLOOD, ED  TYPE AND SCREEN  ABO/RH    EKG  EKG Interpretation None       Radiology No results found.  Procedures Procedures (including critical care time)  Medications Ordered in ED Medications  methadone (DOLOPHINE) 10 MG/ML solution 90 mg (not administered)  sodium chloride 0.9 % bolus 1,000 mL (0 mLs Intravenous Stopped 10/23/17 0906)  pantoprazole (PROTONIX) 80  mg in sodium chloride 0.9 % 100 mL IVPB (0 mg Intravenous Stopped 10/23/17 0906)  ondansetron (ZOFRAN) injection 4 mg (4 mg Intravenous Given 10/23/17 0811)  sodium chloride 0.9 % bolus 1,000 mL (0 mLs Intravenous Stopped 10/23/17 0906)     Initial Impression / Assessment and Plan / ED Course  I have reviewed the triage vital signs and the nursing notes.  Pertinent labs & imaging results that were available during my care of the patient were reviewed by me and considered in my medical decision making (see chart for details).  Clinical Course as of Oct 23 946  Tue Oct 23, 2017  0945 I discussed the case with Ellouise Newer, [facility].  Patient is scheduled for a repeat endoscopy.  Feels that patient can follow-up as an outpatient  [JK]    Clinical Course User Index [JK] Dorie Rank, MD    Patient presented to the emergency room with complaints of nausea vomiting.  He has a known history of esophagitis.  Patient was concerned that he was vomiting up blood based on the appearance.  Patient's had no further episodes of vomiting.  His stool is negative for blood.  His hemoglobin is decreased from the last value 2 years ago.  However, upon reviewing his previous blood test it seems like his baseline was around 9.7.  There was one hemoglobin above 12 today's back around 9.7.  I am not certain if his baseline is generally 9.7 and the previous hemoglobin of 12 was erroneous or whether he has decreased from 2 years ago.  He does not seem to be actively bleeding now.  Patient had no further episodes of vomiting.  He is already scheduled for an outpatient endoscopy.  He will be seeing his GI doctor this week in preparation of that test.  I think is reasonable for him to be discharged and to return if he has any further episodes of hematemesis  Final Clinical Impressions(s) / ED Diagnoses   Final diagnoses:  Nausea and vomiting, intractability of vomiting not specified, unspecified vomiting type    ED  Discharge Orders        Ordered    ondansetron (ZOFRAN ODT) 8 MG disintegrating tablet  Every 8 hours PRN     10/23/17 0948       Dorie Rank, MD 10/23/17 [phone] Patient missed going to his  methadone clinic today because he is in the emergency room.  He did ask for a dose of his methadone.   Dorie Rank, MD 10/23/17 [phone]

## 2017-10-23 NOTE — Discharge Instructions (Addendum)
Follow-up with your GI doctor as planned.  Return for recurrent symptoms, feeling lightheaded, blood in your stool or vomit

## 2017-10-29 ENCOUNTER — Ambulatory Visit (AMBULATORY_SURGERY_CENTER): Payer: Self-pay | Admitting: *Deleted

## 2017-10-29 ENCOUNTER — Other Ambulatory Visit: Payer: Self-pay

## 2017-10-29 VITALS — Ht 72.0 in | Wt 187.6 lb

## 2017-10-29 DIAGNOSIS — R1310 Dysphagia, unspecified: Secondary | ICD-10-CM

## 2017-10-29 NOTE — Progress Notes (Signed)
No egg or soy allergy known to patient  No issues with past sedation with any surgeries  or procedures, no intubation problems  No diet pills per patient  No home 02 use per patient  No blood thinners per patient  Pt denies issues with constipation  No A fib or A flutter  EMMI video sent to pt's e mail pt. Declined has had several times

## 2017-10-31 ENCOUNTER — Encounter: Payer: Self-pay | Admitting: Gastroenterology

## 2017-10-31 ENCOUNTER — Ambulatory Visit (AMBULATORY_SURGERY_CENTER): Payer: Self-pay | Admitting: Gastroenterology

## 2017-10-31 ENCOUNTER — Other Ambulatory Visit: Payer: Self-pay

## 2017-10-31 VITALS — BP 114/62 | HR 76 | Temp 98.7°F | Resp 8 | Ht 72.0 in | Wt 187.0 lb

## 2017-10-31 DIAGNOSIS — R1310 Dysphagia, unspecified: Secondary | ICD-10-CM

## 2017-10-31 DIAGNOSIS — K2100 Gastro-esophageal reflux disease with esophagitis, without bleeding: Secondary | ICD-10-CM

## 2017-10-31 DIAGNOSIS — K222 Esophageal obstruction: Secondary | ICD-10-CM

## 2017-10-31 DIAGNOSIS — R1319 Other dysphagia: Secondary | ICD-10-CM

## 2017-10-31 MED ORDER — SODIUM CHLORIDE 0.9 % IV SOLN: 500.0000 mL | Freq: Once | INTRAVENOUS | Status: DC

## 2017-10-31 NOTE — Progress Notes (Signed)
No problems noted in the recovery room. maw 

## 2017-10-31 NOTE — Patient Instructions (Signed)
YOU HAD AN ENDOSCOPIC PROCEDURE TODAY AT [facility]:   Refer to the procedure report that was given to you for any specific questions about what was found during the examination.  If the procedure report does not answer your questions, please call your gastroenterologist to clarify.  If you requested that your care partner not be given the details of your procedure findings, then the procedure report has been included in a sealed envelope for you to review at your convenience later.  YOU SHOULD EXPECT: Some feelings of bloating in the abdomen. Passage of more gas than usual.  Walking can help get rid of the air that was put into your GI tract during the procedure and reduce the bloating. If you had a lower endoscopy (such as a colonoscopy or flexible sigmoidoscopy) you may notice spotting of blood in your stool or on the toilet paper. If you underwent a bowel prep for your procedure, you may not have a normal bowel movement for a few days.  Please Note:  You might notice some irritation and congestion in your nose or some drainage.  This is from the oxygen used during your procedure.  There is no need for concern and it should clear up in a day or so.  SYMPTOMS TO REPORT IMMEDIATELY:    Following upper endoscopy (EGD)  Vomiting of blood or coffee ground material  New chest pain or pain under the shoulder blades  Painful or persistently difficult swallowing  New shortness of breath  Fever of 100F or higher  Black, tarry-looking stools  For urgent or emergent issues, a gastroenterologist can be reached at any hour by calling [phone].   DIET: Please follow the dilatation diet the rest of today.  Handout was given to your care partner.  Drink plenty of fluids but you should avoid alcoholic beverages for 24 hours.  ACTIVITY:  You should plan to take it easy for the rest of today and you should NOT DRIVE or use heavy machinery until tomorrow (because of the sedation  medicines used during the test).    FOLLOW UP: Our staff will call the number listed on your records the next business day following your procedure to check on you and address any questions or concerns that you may have regarding the information given to you following your procedure. If we do not reach you, we will leave a message.  However, if you are feeling well and you are not experiencing any problems, there is no need to return our call.  We will assume that you have returned to your regular daily activities without incident.  If any biopsies were taken you will be contacted by phone or by letter within the next 1-3 weeks.  Please call us at [phone] if you have not heard about the biopsies in 3 weeks.    SIGNATURES/CONFIDENTIALITY: You and/or your care partner have signed paperwork which will be entered into your electronic medical record.  These signatures attest to the fact that that the information above on your After Visit Summary has been reviewed and is understood.  Full responsibility of the confidentiality of this discharge information lies with you and/or your care-partner.   Handouts were given to your care partner on a hiatal hernia, esophagitis with stricture, and the dilatation diet to follow the rest of today. You may resume your current medications today. Follow up in the GI office in 3 months.  The office will call you with an appointment. Please  call if any questions or concerns.

## 2017-10-31 NOTE — Progress Notes (Signed)
Called to room to assist during endoscopic procedure.  Patient ID and intended procedure confirmed with present staff. Received instructions for my participation in the procedure from the performing physician.  

## 2017-10-31 NOTE — Op Note (Signed)
[facility] Patient Name: Eric Hood Procedure Date: 10/31/2017 2:54 PM MRN: [id number] Endoscopist: Ladene Artist , MD Age: 37 Referring MD:  Date of Birth: July 22, 1981 Gender: Male Account #: [id number] Procedure:                Upper GI endoscopy Indications:              Therapeutic procedure for dilation, Dysphagia Medicines:                Monitored Anesthesia Care Procedure:                Pre-Anesthesia Assessment:                           - Prior to the procedure, a History and Physical                            was performed, and patient medications and                            allergies were reviewed. The patient's tolerance of                            previous anesthesia was also reviewed. The risks                            and benefits of the procedure and the sedation                            options and risks were discussed with the patient.                            All questions were answered, and informed consent                            was obtained. Prior Anticoagulants: The patient has                            taken no previous anticoagulant or antiplatelet                            agents. ASA Grade Assessment: II - A patient with                            mild systemic disease. After reviewing the risks                            and benefits, the patient was deemed in                            satisfactory condition to undergo the procedure.                           After obtaining informed consent, the endoscope was  passed under direct vision. Throughout the                            procedure, the patient's blood pressure, pulse, and                            oxygen saturations were monitored continuously. The                            Endoscope was introduced through the mouth, and                            advanced to the second part of duodenum. The upper                            GI endoscopy  was accomplished without difficulty.                            The patient tolerated the procedure well. Scope In: Scope Out: Findings:                 LA Grade C (one or more mucosal breaks continuous                            between tops of 2 or more mucosal folds, less than                            75% circumference) esophagitis with no bleeding was                            found in the distal esophagus.                           One moderate benign-appearing, intrinsic stenosis                            was found in the mid esophagus. This measured 1.2                            cm (inner diameter) and was traversed. A guidewire                            was placed and the scope was withdrawn. Dilation                            was performed with a Savary dilator with mild                            resistance at 13 mm. Estimated blood loss was                            minimal.  The exam of the esophagus was otherwise normal.                           A medium-sized hiatal hernia was present.                           The exam of the stomach was otherwise normal.                           The duodenal bulb and second portion of the                            duodenum were normal. Complications:            No immediate complications. Estimated Blood Loss:     Estimated blood loss was minimal. Impression:               - LA Grade C reflux esophagitis.                           - Benign-appearing esophageal stenosis. Dilated.                           - Medium-sized hiatal hernia.                           - Normal duodenal bulb and second portion of the                            duodenum.                           - No specimens collected. Recommendation:           - Patient has a contact number available for                            emergencies. The signs and symptoms of potential                            delayed complications were discussed  with the                            patient. Return to normal activities tomorrow.                            Written discharge instructions were provided to the                            patient.                           - Clear liquid diet for 2 hours, then advance as                            tolerated to soft diet today. Resume prior diet  tomorrrow.                           - Continue present medications.                           - Return to GI office in 3 months. Ladene Artist, MD 10/31/2017 3:17:47 PM This report has been signed electronically.

## 2017-10-31 NOTE — Progress Notes (Signed)
To recovery, report to RN, VSS. 

## 2017-10-31 NOTE — Progress Notes (Signed)
Pt's states no medical or surgical changes since previsit or office visit. 

## 2017-11-01 ENCOUNTER — Telehealth: Payer: Self-pay

## 2017-11-01 NOTE — Telephone Encounter (Signed)
  Follow up Call-  Call back number 10/31/2017 07/24/2017 07/27/2015  Post procedure Call Back phone  # [phone]  Permission to leave phone message Yes Yes Yes  Some recent data might be hidden     Patient questions:  Do you have a fever, pain , or abdominal swelling? No. Pain Score  0 *  Have you tolerated food without any problems? Yes.    Have you been able to return to your normal activities? Yes.    Do you have any questions about your discharge instructions: Diet   No. Medications  No. Follow up visit  No.  Do you have questions or concerns about your Care? No.  Actions: * If pain score is 4 or above: No action needed, pain <4.

## 2018-01-31 ENCOUNTER — Ambulatory Visit (INDEPENDENT_AMBULATORY_CARE_PROVIDER_SITE_OTHER): Payer: Self-pay | Admitting: Family Medicine

## 2018-01-31 ENCOUNTER — Encounter: Payer: Self-pay | Admitting: Family Medicine

## 2018-01-31 VITALS — BP 90/62 | HR 98 | Temp 98.6°F | Ht 72.0 in | Wt 192.2 lb

## 2018-01-31 DIAGNOSIS — J209 Acute bronchitis, unspecified: Secondary | ICD-10-CM

## 2018-01-31 MED ORDER — AZITHROMYCIN 250 MG PO TABS: ORAL_TABLET | ORAL | 0 refills | Status: DC

## 2018-01-31 MED ORDER — HYDROCODONE-HOMATROPINE 5-1.5 MG/5ML PO SYRP: 5.0000 mL | ORAL_SOLUTION | ORAL | 0 refills | Status: DC | PRN

## 2018-01-31 NOTE — Progress Notes (Signed)
   Subjective:    Patient ID: Eric Hood, male    DOB: 11-Mar-1981, 37 y.o.   MRN: [id number]  HPI Here for 2 weeks of coughing which produced yellow sputum at first but which is mostly dry now. No chest pain or SOB. No fever. Last night he apparently coughed up some blood tinged sputum. Taking Mucinex.    Review of Systems  Constitutional: Negative.   HENT: Negative.   Eyes: Negative.   Respiratory: Positive for cough. Negative for shortness of breath and wheezing.   Cardiovascular: Negative.        Objective:   Physical Exam  Constitutional: He appears well-developed and well-nourished.  HENT:  Right Ear: External ear normal.  Left Ear: External ear normal.  Nose: Nose normal.  Mouth/Throat: Oropharynx is clear and moist.  Eyes: Conjunctivae are normal.  Neck: No thyromegaly present.  Cardiovascular: Normal rate, regular rhythm, normal heart sounds and intact distal pulses.  Pulmonary/Chest: Effort normal. No respiratory distress. He has no wheezes. He has no rales.  Soft rhonchi   Lymphadenopathy:    He has no cervical adenopathy.          Assessment & Plan:  Bronchitis, treat with a Zpack. Alysia Penna, MD

## 2018-03-04 ENCOUNTER — Telehealth: Payer: Self-pay | Admitting: Gastroenterology

## 2018-03-04 NOTE — Telephone Encounter (Signed)
Patient requesting a repeat EGD and dilation.  Please see procedure report from January.  Ok to schedule or does he need OV?

## 2018-03-04 NOTE — Telephone Encounter (Signed)
Pt had EGD done this past January and is still having trouble swallowing. He wants to know if he can have EGD again.

## 2018-03-04 NOTE — Telephone Encounter (Signed)
OK for direct schedule for EGD/dilation in [place] Please reinforce the importance of taking his pantoprazole 40 mg bid and ranitidine 300 mg po hs (both meds each and every day). Please ask if he is taking them as prescribed and if not find out why and if there is anything we can do to help. If he need refills please provide for 1 year.

## 2018-03-05 ENCOUNTER — Ambulatory Visit (AMBULATORY_SURGERY_CENTER): Payer: Self-pay | Admitting: *Deleted

## 2018-03-05 ENCOUNTER — Other Ambulatory Visit: Payer: Self-pay

## 2018-03-05 VITALS — Ht 72.0 in | Wt 188.2 lb

## 2018-03-05 DIAGNOSIS — R1310 Dysphagia, unspecified: Secondary | ICD-10-CM

## 2018-03-05 MED ORDER — RANITIDINE HCL 300 MG PO TABS: 300.0000 mg | ORAL_TABLET | Freq: Every day | ORAL | 11 refills | Status: DC

## 2018-03-05 MED ORDER — PANTOPRAZOLE SODIUM 40 MG PO TBEC: 40.0000 mg | DELAYED_RELEASE_TABLET | Freq: Two times a day (BID) | ORAL | 11 refills | Status: DC

## 2018-03-05 NOTE — Telephone Encounter (Signed)
Patient has been taking pantoprazole and zantac as ordered.  I sent refills for both.  He will come today for EGD instructions and EGD on 03/08/18 10:30 with Dr. Fuller Plan.

## 2018-03-05 NOTE — Progress Notes (Signed)
No egg or soy allergy known to patient  No issues with past sedation with any surgeries  or procedures, no intubation problems  No diet pills per patient No home 02 use per patient  No blood thinners per patient  Pt denies issues with constipation  No A fib or A flutter  EMMI video sent to pt's e mail pt declined   

## 2018-03-08 ENCOUNTER — Encounter: Payer: Self-pay | Admitting: Gastroenterology

## 2018-03-08 ENCOUNTER — Ambulatory Visit (AMBULATORY_SURGERY_CENTER): Payer: Self-pay | Admitting: Gastroenterology

## 2018-03-08 ENCOUNTER — Other Ambulatory Visit: Payer: Self-pay

## 2018-03-08 VITALS — BP 110/80 | HR 89 | Temp 99.1°F | Resp 11 | Ht 72.0 in | Wt 188.0 lb

## 2018-03-08 DIAGNOSIS — R1310 Dysphagia, unspecified: Secondary | ICD-10-CM

## 2018-03-08 DIAGNOSIS — K222 Esophageal obstruction: Secondary | ICD-10-CM

## 2018-03-08 DIAGNOSIS — K2100 Gastro-esophageal reflux disease with esophagitis, without bleeding: Secondary | ICD-10-CM

## 2018-03-08 DIAGNOSIS — R1319 Other dysphagia: Secondary | ICD-10-CM

## 2018-03-08 MED ORDER — SODIUM CHLORIDE 0.9 % IV SOLN: 500.0000 mL | Freq: Once | INTRAVENOUS | Status: DC

## 2018-03-08 NOTE — Progress Notes (Signed)
Patient is to have repeat EGD but there were no appointments available within the 4 week time frame, Dr. Fuller Plan notified and ok with procedure being a little further out. SM

## 2018-03-08 NOTE — Patient Instructions (Signed)
YOU HAD AN ENDOSCOPIC PROCEDURE TODAY AT [facility]:   Refer to the procedure report that was given to you for any specific questions about what was found during the examination.  If the procedure report does not answer your questions, please call your gastroenterologist to clarify.  If you requested that your care partner not be given the details of your procedure findings, then the procedure report has been included in a sealed envelope for you to review at your convenience later.  YOU SHOULD EXPECT: Some feelings of bloating in the abdomen. Passage of more gas than usual.  Walking can help get rid of the air that was put into your GI tract during the procedure and reduce the bloating. If you had a lower endoscopy (such as a colonoscopy or flexible sigmoidoscopy) you may notice spotting of blood in your stool or on the toilet paper. If you underwent a bowel prep for your procedure, you may not have a normal bowel movement for a few days.  Please Note:  You might notice some irritation and congestion in your nose or some drainage.  This is from the oxygen used during your procedure.  There is no need for concern and it should clear up in a day or so.  SYMPTOMS TO REPORT IMMEDIATELY:    Following upper endoscopy (EGD)  Vomiting of blood or coffee ground material  New chest pain or pain under the shoulder blades  Painful or persistently difficult swallowing  New shortness of breath  Fever of 100F or higher  Black, tarry-looking stools  Please see handouts given to you on Esophagitis and Stricture, Hiatal Hernia and Post Dilation Diet.  For urgent or emergent issues, a gastroenterologist can be reached at any hour by calling [phone].  It is crucial to stay on Pantoprazole 40 mg twice a day before meals and Ranitidine 300 mg at bedtime.  Upper Endoscopy in 1 month.  Procedure is scheduled for Tuesday 04/23/18 at 4:oo pm, Previsit will be on July 11/2017 at 4:30.  DIET:   Clear liquids on 2 hours. At 2:00 pm you may start soft diet for the rest of the day. Tomorrow you may proceed to your regular diet.  Drink plenty of fluids but you should avoid alcoholic beverages for 24 hours.  ACTIVITY:  You should plan to take it easy for the rest of today and you should NOT DRIVE or use heavy machinery until tomorrow (because of the sedation medicines used during the test).    FOLLOW UP: Our staff will call the number listed on your records the next business day following your procedure to check on you and address any questions or concerns that you may have regarding the information given to you following your procedure. If we do not reach you, we will leave a message.  However, if you are feeling well and you are not experiencing any problems, there is no need to return our call.  We will assume that you have returned to your regular daily activities without incident.  If any biopsies were taken you will be contacted by phone or by letter within the next 1-3 weeks.  Please call us at [phone] if you have not heard about the biopsies in 3 weeks.    SIGNATURES/CONFIDENTIALITY: You and/or your care partner have signed paperwork which will be entered into your electronic medical record.  These signatures attest to the fact that that the information above on your After Visit Summary has been  reviewed and is understood.  Full responsibility of the confidentiality of this discharge information lies with you and/or your care-partner.  Thank you for letting us take care of your healthcare needs today.

## 2018-03-08 NOTE — Op Note (Signed)
[facility] [facility] Patient Name: Eric Hood Procedure Date: 03/08/2018 10:32 AM MRN: [id number] Endoscopist: Ladene Artist , MD Age: 37 Referring MD:  Date of Birth: Jan 24, 1981 Gender: Male Account #: [id number] Procedure:                Upper GI endoscopy Indications:              Dysphagia Medicines:                Monitored Anesthesia Care Procedure:                Pre-Anesthesia Assessment:                           - Prior to the procedure, a History and Physical                            was performed, and patient medications and                            allergies were reviewed. The patient's tolerance of                            previous anesthesia was also reviewed. The risks                            and benefits of the procedure and the sedation                            options and risks were discussed with the patient.                            All questions were answered, and informed consent                            was obtained. Prior Anticoagulants: The patient has                            taken no previous anticoagulant or antiplatelet                            agents. ASA Grade Assessment: II - A patient with                            mild systemic disease. After reviewing the risks                            and benefits, the patient was deemed in                            satisfactory condition to undergo the procedure.                           After obtaining informed consent, the endoscope was  passed under direct vision. Throughout the                            procedure, the patient's blood pressure, pulse, and                            oxygen saturations were monitored continuously. The                            Endoscope was introduced through the mouth, and                            advanced to the second part of duodenum. The upper                            GI endoscopy was somewhat difficult due to  abnormal                            anatomy, stricture. The patient tolerated the                            procedure well. Scope In: Scope Out: Findings:                 LA Grade C (one or more mucosal breaks continuous                            between tops of 2 or more mucosal folds, less than                            75% circumference) esophagitis with bleeding was                            found in the proximal, mid and distal esophagus.                           One benign-appearing, intrinsic severe stenosis was                            found 26 cm from the incisors. The stenosis was                            traversed after dilation. A TTS dilator was passed                            through the scope. Dilation with an 8.5-9.5-10.5 mm                            balloon dilator and then with an 11 - 12 mm balloon                            dilator was performed. The endosocpe could not  traverse the stricture after dilation to 10.5 mm.                            It was able to traverse the stricture with minimal                            resistance after dilation to 12 mm.                           The exam of the esophagus was otherwise normal.                           A medium-sized hiatal hernia was present.                           A few localized, medium non-bleeding erosions were                            found in the gastric fundus. There were no stigmata                            of recent bleeding.                           The exam of the stomach was otherwise normal.                           The duodenal bulb and second portion of the                            duodenum were normal. Complications:            No immediate complications. Estimated Blood Loss:     Estimated blood loss was minimal. Impression:               - LA Grade C reflux esophagitis.                           - Benign-appearing esophageal stenosis. Dilated.                            - Medium-sized hiatal hernia.                           - Non-bleeding erosive gastropathy.                           - Normal duodenal bulb and second portion of the                            duodenum.                           - No specimens collected. Recommendation:           - Patient has a contact number available for  emergencies. The signs and symptoms of potential                            delayed complications were discussed with the                            patient. Return to normal activities tomorrow.                            Written discharge instructions were provided to the                            patient.                           - Clear liquid diet for 2 hours, then advance as                            tolerated to soft diet today.                           - Continue present medications.                           - It is essential to remain on pantooprazole 40 mg                            bid ac and raniditine 300 mg hs long term.                           - Repeat upper endoscopy in 1 month to assess                            disease activity, repeat dilation. Ladene Artist, MD 03/08/2018 11:05:13 AM This report has been signed electronically.

## 2018-03-08 NOTE — Progress Notes (Signed)
To recovery, report to RN, VSS. 

## 2018-03-08 NOTE — Progress Notes (Signed)
Called to room to assist during endoscopic procedure.  Patient ID and intended procedure confirmed with present staff. Received instructions for my participation in the procedure from the performing physician.  

## 2018-03-08 NOTE — Progress Notes (Signed)
No changes in medical or surgical hx since PV per pt 

## 2018-03-12 ENCOUNTER — Telehealth: Payer: Self-pay | Admitting: *Deleted

## 2018-03-12 NOTE — Telephone Encounter (Signed)
  Follow up Call-  Call back number 03/08/2018 10/31/2017 07/24/2017 07/27/2015  Post procedure Call Back phone  # [phone]  Permission to leave phone message Yes Yes Yes Yes  Some recent data might be hidden     Patient questions:  Do you have a fever, pain , or abdominal swelling? No. Pain Score  0 *  Have you tolerated food without any problems? Yes.    Have you been able to return to your normal activities? Yes.    Do you have any questions about your discharge instructions: Diet   No. Medications  No. Follow up visit  No.  Do you have questions or concerns about your Care? No.  Actions: * If pain score is 4 or above: No action needed, pain <4.

## 2018-04-16 ENCOUNTER — Other Ambulatory Visit: Payer: Self-pay

## 2018-04-16 ENCOUNTER — Ambulatory Visit (AMBULATORY_SURGERY_CENTER): Payer: Self-pay

## 2018-04-16 VITALS — Ht 72.0 in | Wt 190.0 lb

## 2018-04-16 DIAGNOSIS — R1310 Dysphagia, unspecified: Secondary | ICD-10-CM

## 2018-04-16 NOTE — Progress Notes (Signed)
Denies allergies to eggs or soy products. Denies complication of anesthesia or sedation. Denies use of weight loss medication. Denies use of O2.   Emmi instructions declined.  

## 2018-04-23 ENCOUNTER — Encounter

## 2018-04-23 ENCOUNTER — Ambulatory Visit (AMBULATORY_SURGERY_CENTER): Payer: Self-pay | Admitting: Gastroenterology

## 2018-04-23 ENCOUNTER — Encounter: Payer: Self-pay | Admitting: Gastroenterology

## 2018-04-23 VITALS — BP 108/66 | HR 81 | Temp 99.1°F | Resp 15 | Ht 72.0 in | Wt 190.0 lb

## 2018-04-23 DIAGNOSIS — R1310 Dysphagia, unspecified: Secondary | ICD-10-CM

## 2018-04-23 DIAGNOSIS — K222 Esophageal obstruction: Secondary | ICD-10-CM

## 2018-04-23 DIAGNOSIS — R1319 Other dysphagia: Secondary | ICD-10-CM

## 2018-04-23 MED ORDER — SODIUM CHLORIDE 0.9 % IV SOLN: 500.0000 mL | Freq: Once | INTRAVENOUS | Status: DC

## 2018-04-23 NOTE — Op Note (Signed)
[facility] Patient Name: Eric Hood Procedure Date: 04/23/2018 4:03 PM MRN: [id number] Endoscopist: Ladene Artist , MD Age: 37 Referring MD:  Date of Birth: 25-Nov-1980 Gender: Male Account #: [id number] Procedure:                Upper GI endoscopy Indications:              Therapeutic procedure for esophageal stricture                            dilation, Dysphagia Medicines:                Monitored Anesthesia Care Procedure:                Pre-Anesthesia Assessment:                           - Prior to the procedure, a History and Physical                            was performed, and patient medications and                            allergies were reviewed. The patient's tolerance of                            previous anesthesia was also reviewed. The risks                            and benefits of the procedure and the sedation                            options and risks were discussed with the patient.                            All questions were answered, and informed consent                            was obtained. Prior Anticoagulants: The patient has                            taken no previous anticoagulant or antiplatelet                            agents. ASA Grade Assessment: II - A patient with                            mild systemic disease. After reviewing the risks                            and benefits, the patient was deemed in                            satisfactory condition to undergo the procedure.  After obtaining informed consent, the endoscope was                            passed under direct vision. Throughout the                            procedure, the patient's blood pressure, pulse, and                            oxygen saturations were monitored continuously. The                            Endoscope was introduced through the mouth, and                            advanced to the second part of duodenum. The  upper                            GI endoscopy was accomplished without difficulty.                            The patient tolerated the procedure well. Scope In: Scope Out: Findings:                 One benign-appearing, intrinsic severe stenosis was                            found 26 cm from the incisors. This stenosis                            measured 9 mm (inner diameter). The stenosis was                            traversed after dilation. A guidewire was placed                            and the scope was withdrawn. Dilation was performed                            with a Savary dilator with no resistance at 10 mm.                            Dilations were performed with Savary dilators with                            mild resistance at 11 mm, 12 mm and 13 mm. Heme                            noted on last 3 dilators. The dilation site was                            examined following endoscope reinsertion and showed  moderate improvement in luminal narrowing.                           One cratered esophageal ulcer with no bleeding and                            no stigmata of recent bleeding was found 35 cm from                            the incisors. The lesion was 10 mm in largest                            dimension.                           LA Grade C (one or more mucosal breaks continuous                            between tops of 2 or more mucosal folds, less than                            75% circumference) esophagitis with no bleeding was                            found in the mid esophagus.                           A medium-sized hiatal hernia was present.                           The exam of the stomach was otherwise normal.                           The duodenal bulb and second portion of the                            duodenum were normal. Complications:            No immediate complications. Estimated Blood Loss:     Estimated blood  loss was minimal. Impression:               - Benign-appearing esophageal stenosis. Dilated.                           - Non-bleeding esophageal ulcer.                           - LA Grade C reflux esophagitis.                           - Medium-sized hiatal hernia.                           - Normal duodenal bulb and second portion of the  duodenum.                           - No specimens collected. Recommendation:           - Clear liquid diet for 2 hours, then advance as                            tolerated to soft diet today.                           - Continue present medications. Compliance with                            pantoprazole 40 mg po bid and raniditine 300 mg hs                            was stressed.                           - Repeat upper endoscopy in about 1 month to                            evaluate the response to therapy and repeat                            dilation. Ladene Artist, MD 04/23/2018 4:28:19 PM This report has been signed electronically.

## 2018-04-23 NOTE — Progress Notes (Signed)
A and O x3. Report to RN. Tolerated MAC anesthesia well.Teeth unchanged after procedure.

## 2018-04-23 NOTE — Patient Instructions (Signed)
Handouts given:  Post dilation diet, Stricture, and GERD  Continue medications  Appointment for Repeat Endo made/needs to be made  YOU HAD AN ENDOSCOPIC PROCEDURE TODAY AT [facility]:   Refer to the procedure report that was given to you for any specific questions about what was found during the examination.  If the procedure report does not answer your questions, please call your gastroenterologist to clarify.  If you requested that your care partner not be given the details of your procedure findings, then the procedure report has been included in a sealed envelope for you to review at your convenience later.  YOU SHOULD EXPECT: Some feelings of bloating in the abdomen. Passage of more gas than usual.  Walking can help get rid of the air that was put into your GI tract during the procedure and reduce the bloating. If you had a lower endoscopy (such as a colonoscopy or flexible sigmoidoscopy) you may notice spotting of blood in your stool or on the toilet paper. If you underwent a bowel prep for your procedure, you may not have a normal bowel movement for a few days.  Please Note:  You might notice some irritation and congestion in your nose or some drainage.  This is from the oxygen used during your procedure.  There is no need for concern and it should clear up in a day or so.  SYMPTOMS TO REPORT IMMEDIATELY:   Following upper endoscopy (EGD)  Vomiting of blood or coffee ground material  New chest pain or pain under the shoulder blades  Painful or persistently difficult swallowing  New shortness of breath  Fever of 100F or higher  Black, tarry-looking stools  For urgent or emergent issues, a gastroenterologist can be reached at any hour by calling [phone].   DIET:  We do recommend a small meal at first, but then you may proceed to your regular diet.  Drink plenty of fluids but you should avoid alcoholic beverages for 24 hours.  ACTIVITY:  You should plan to  take it easy for the rest of today and you should NOT DRIVE or use heavy machinery until tomorrow (because of the sedation medicines used during the test).    FOLLOW UP: Our staff will call the number listed on your records the next business day following your procedure to check on you and address any questions or concerns that you may have regarding the information given to you following your procedure. If we do not reach you, we will leave a message.  However, if you are feeling well and you are not experiencing any problems, there is no need to return our call.  We will assume that you have returned to your regular daily activities without incident.  If any biopsies were taken you will be contacted by phone or by letter within the next 1-3 weeks.  Please call us at [phone] if you have not heard about the biopsies in 3 weeks.    SIGNATURES/CONFIDENTIALITY: You and/or your care partner have signed paperwork which will be entered into your electronic medical record.  These signatures attest to the fact that that the information above on your After Visit Summary has been reviewed and is understood.  Full responsibility of the confidentiality of this discharge information lies with you and/or your care-partner.

## 2018-04-23 NOTE — Progress Notes (Signed)
Called to room to assist during endoscopic procedure.  Patient ID and intended procedure confirmed with present staff. Received instructions for my participation in the procedure from the performing physician.  

## 2018-04-23 NOTE — Progress Notes (Signed)
I have reviewed the patient's medical history in detail and updated the computerized patient record.

## 2018-04-24 ENCOUNTER — Telehealth: Payer: Self-pay

## 2018-04-24 NOTE — Telephone Encounter (Signed)
  Follow up Call-  Call Julyan Gales number 04/23/2018 03/08/2018 10/31/2017 07/24/2017  Post procedure Call Shayley Medlin phone  # [phone]  Permission to leave phone message Yes Yes Yes Yes  Some recent data might be hidden     Patient questions:  Do you have a fever, pain , or abdominal swelling? No. Pain Score  0 *  Have you tolerated food without any problems? Yes.    Have you been able to return to your normal activities? Yes.    Do you have any questions about your discharge instructions: Diet   No. Medications  No. Follow up visit  No.  Do you have questions or concerns about your Care? No.  Actions: * If pain score is 4 or above: No action needed, pain <4.

## 2018-05-08 ENCOUNTER — Ambulatory Visit (INDEPENDENT_AMBULATORY_CARE_PROVIDER_SITE_OTHER): Payer: Self-pay | Admitting: Family Medicine

## 2018-05-08 ENCOUNTER — Encounter: Payer: Self-pay | Admitting: Family Medicine

## 2018-05-08 VITALS — BP 110/70 | HR 97 | Temp 98.5°F | Wt 185.2 lb

## 2018-05-08 DIAGNOSIS — K5903 Drug induced constipation: Secondary | ICD-10-CM

## 2018-05-08 DIAGNOSIS — T402X5A Adverse effect of other opioids, initial encounter: Secondary | ICD-10-CM

## 2018-05-08 NOTE — Patient Instructions (Signed)
Drink plenty of fluids  Consider daily Miralax 17 grams once daily  Try to get at least 25 -30 grams of fiber per day  Consider once daily probiotic  Consider Senna two tablets once daily as needed  Be in touch if the above not working.

## 2018-05-08 NOTE — Progress Notes (Signed)
Subjective:     Patient ID: Eric Hood, male   DOB: 1981/04/20, 37 y.o.   MRN: [id number]  HPI Patient is seen to discuss constipation issues. He has chronic pain related his back and is treated through the methadone clinic and currently on methadone 75 mg daily. He states he sometimes goes several days without bowel movement and we does go has to strain frequently.   small caliber stool. He's tried occasionally stool softeners the past but not consistently and also tried MiraLAX . He frequent feels bloated. No history of true impaction. No nausea or vomiting.  He had normal colonoscopy 2/16  Past Medical History:  Diagnosis Date  . Anemia 10/23/2014  . Anxiety   . Back pain   . Blood transfusion without reported diagnosis april 2016  . Esophageal stricture 03/2014  . GERD (gastroesophageal reflux disease)   . Hiatal hernia   . Reflux   . 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 Ulcerative esophagitis 03/2014   severe   Past Surgical History:  Procedure Laterality Date  . BALLOON DILATION N/A 05/20/2015   Procedure: BALLOON DILATION;  Surgeon: Milus Banister, MD;  Location: [facility];  Service: Endoscopy;  Laterality: N/A;  . COLONOSCOPY    . ESOPHAGEAL DILATION     On 3 occasions: 03/2014, 08/2014, 09/2014  . ESOPHAGOGASTRODUODENOSCOPY N/A 05/20/2015   Procedure: ESOPHAGOGASTRODUODENOSCOPY (EGD);  Surgeon: Milus Banister, MD;  Location: [facility];  Service: Endoscopy;  Laterality: N/A;  . ESOPHAGOGASTRODUODENOSCOPY (EGD) WITH PROPOFOL N/A 01/18/2015   Procedure: ESOPHAGOGASTRODUODENOSCOPY (EGD) WITH PROPOFOL;  Surgeon: Lafayette Dragon, MD;  Location: [facility];  Service: Endoscopy;  Laterality: N/A;  . ESOPHAGOGASTRODUODENOSCOPY (EGD) WITH PROPOFOL N/A 09/04/2017   Procedure: ESOPHAGOGASTRODUODENOSCOPY (EGD) WITH PROPOFOL;  Surgeon: Ladene Artist, MD;  Location: [facility];  Service: Endoscopy;  Laterality: N/A;  need fluro  . ESOPHAGOGASTRODUODENOSCOPY (EGD) WITH  PROPOFOL N/A 10/01/2017   Procedure: ESOPHAGOGASTRODUODENOSCOPY (EGD) WITH PROPOFOL;  Surgeon: Ladene Artist, MD;  Location: [facility];  Service: Endoscopy;  Laterality: N/A;  . HAND SURGERY Right   . SAVORY DILATION N/A 09/04/2017   Procedure: SAVORY DILATION;  Surgeon: Ladene Artist, MD;  Location: [facility];  Service: Endoscopy;  Laterality: N/A;  . SAVORY DILATION N/A 10/01/2017   Procedure: SAVORY DILATION;  Surgeon: Ladene Artist, MD;  Location: [facility];  Service: Endoscopy;  Laterality: N/A;  . UPPER GASTROINTESTINAL ENDOSCOPY      reports that he has never smoked. He has never used smokeless tobacco. He reports that he drinks alcohol. He reports that he does not use drugs. family history includes Hypertension in his father and mother. Allergies  Allergen Reactions  . Hydrocodone Nausea And Vomiting     Review of Systems  Respiratory: Negative for shortness of breath.   Gastrointestinal: Positive for constipation. Negative for blood in stool, nausea and vomiting.       Objective:   Physical Exam  Constitutional: He appears well-developed and well-nourished.  Cardiovascular: Normal rate and regular rhythm.  Pulmonary/Chest: Effort normal and breath sounds normal.  Abdominal: Soft. Bowel sounds are normal. He exhibits no mass.       Assessment:     Chronic opioid-induced constipation    Plan:     -We recommended conservative approach initially with increasing fluids, fiber of 25-30 g per day, consider once daily probiotic, MiraLAX 17 g daily, and consider senna 1-2 tablets daily. Touch base in 2 weeks if not seeing results  with the above. May need to consider motility agent if not getting relief with the above  Eulas Post MD [facility]

## 2018-05-30 ENCOUNTER — Ambulatory Visit (AMBULATORY_SURGERY_CENTER): Payer: Self-pay

## 2018-05-30 VITALS — Ht 72.0 in | Wt 186.2 lb

## 2018-05-30 DIAGNOSIS — K222 Esophageal obstruction: Secondary | ICD-10-CM

## 2018-05-30 NOTE — Progress Notes (Signed)
Per pt, no allergies to soy or egg products.Pt not taking any weight loss meds or using  O2 at home.  Pt refused emmi video. 

## 2018-06-05 ENCOUNTER — Encounter: Payer: Self-pay | Admitting: Gastroenterology

## 2018-06-05 ENCOUNTER — Ambulatory Visit (AMBULATORY_SURGERY_CENTER): Payer: Self-pay | Admitting: Gastroenterology

## 2018-06-05 VITALS — BP 107/62 | HR 75 | Temp 98.9°F | Resp 15 | Ht 72.0 in | Wt 186.0 lb

## 2018-06-05 DIAGNOSIS — K2210 Ulcer of esophagus without bleeding: Secondary | ICD-10-CM

## 2018-06-05 DIAGNOSIS — K2100 Gastro-esophageal reflux disease with esophagitis, without bleeding: Secondary | ICD-10-CM

## 2018-06-05 DIAGNOSIS — R1310 Dysphagia, unspecified: Secondary | ICD-10-CM

## 2018-06-05 DIAGNOSIS — K222 Esophageal obstruction: Secondary | ICD-10-CM

## 2018-06-05 DIAGNOSIS — R1319 Other dysphagia: Secondary | ICD-10-CM

## 2018-06-05 MED ORDER — SODIUM CHLORIDE 0.9 % IV SOLN: 500.0000 mL | Freq: Once | INTRAVENOUS | Status: DC

## 2018-06-05 NOTE — Progress Notes (Signed)
Report to PACU, RN, vss, BBS= Clear.  

## 2018-06-05 NOTE — Op Note (Signed)
[facility] Patient Name: Eric Hood Procedure Date: 06/05/2018 1:49 PM MRN: [id number] Endoscopist: Ladene Artist , MD Age: 37 Referring MD:  Date of Birth: [birth year] Gender: Male Account #: [id number] Procedure:                Upper GI endoscopy Indications:              Therapeutic procedure for esophageal stricture                            dilation, Dysphagia Medicines:                Monitored Anesthesia Care Procedure:                Pre-Anesthesia Assessment:                           - Prior to the procedure, a History and Physical                            was performed, and patient medications and                            allergies were reviewed. The patient's tolerance of                            previous anesthesia was also reviewed. The risks                            and benefits of the procedure and the sedation                            options and risks were discussed with the patient.                            All questions were answered, and informed consent                            was obtained. Prior Anticoagulants: The patient has                            taken no previous anticoagulant or antiplatelet                            agents. ASA Grade Assessment: II - A patient with                            mild systemic disease. After reviewing the risks                            and benefits, the patient was deemed in                            satisfactory condition to undergo the procedure.  After obtaining informed consent, the endoscope was                            passed under direct vision. Throughout the                            procedure, the patient's blood pressure, pulse, and                            oxygen saturations were monitored continuously. The                            Endoscope was introduced through the mouth, and                            advanced to the second part of duodenum.  The upper                            GI endoscopy was accomplished without difficulty.                            The patient tolerated the procedure well. Scope In: Scope Out: Findings:                 One benign-appearing, intrinsic moderate stenosis                            was found 26 cm from the incisors. This stenosis                            measured 1 cm (inner diameter). The stenosis was                            traversed. A guidewire was placed and the scope was                            withdrawn. Dilations were performed with Savary                            dilators with mild resistance at 12 mm, 13 mm and                            14 mm. Heme on each dilator.                           One cratered esophageal ulcer with no bleeding and                            no stigmata of recent bleeding was found 34 to 35                            cm from the incisors.  The exam of the esophagus was otherwise normal.                           LA Grade C (one or more mucosal breaks continuous                            between tops of 2 or more mucosal folds, less than                            75% circumference) esophagitis with no bleeding was                            found in the distal esophagus.                           A medium-sized hiatal hernia was present.                           The exam of the stomach was otherwise normal.                           The duodenal bulb and second portion of the                            duodenum were normal. Complications:            No immediate complications. Estimated Blood Loss:     Estimated blood loss was minimal. Impression:               - Benign-appearing esophageal stenosis. Dilated.                           - Non-bleeding esophageal ulcer.                           - LA Grade C reflux esophagitis.                           - Medium-sized hiatal hernia.                           - Normal  duodenal bulb and second portion of the                            duodenum.                           - No specimens collected. Recommendation:           - Clear liquid diet today, then advance as                            tolerated to soft diet today.                           - Continue present medications.                           -  Long term compliance with pantoprazole 40 mg po                            bid and ranitidine 300 mg hs was stressed.                           - Return to GI office in 3 months. Ladene Artist, MD 06/05/2018 2:12:52 PM This report has been signed electronically.

## 2018-06-05 NOTE — Progress Notes (Signed)
I have reviewed the patient's medical history in detail and updated the computerized patient record.

## 2018-06-05 NOTE — Progress Notes (Signed)
Called to room to assist during endoscopic procedure.  Patient ID and intended procedure confirmed with present staff. Received instructions for my participation in the procedure from the performing physician.  

## 2018-06-05 NOTE — Patient Instructions (Signed)
YOU HAD AN ENDOSCOPIC PROCEDURE TODAY AT [facility]:   Refer to the procedure report that was given to you for any specific questions about what was found during the examination.  If the procedure report does not answer your questions, please call your gastroenterologist to clarify.  If you requested that your care partner not be given the details of your procedure findings, then the procedure report has been included in a sealed envelope for you to review at your convenience later.  YOU SHOULD EXPECT: Some feelings of bloating in the abdomen. Passage of more gas than usual.  Walking can help get rid of the air that was put into your GI tract during the procedure and reduce the bloating. If you had a lower endoscopy (such as a colonoscopy or flexible sigmoidoscopy) you may notice spotting of blood in your stool or on the toilet paper. If you underwent a bowel prep for your procedure, you may not have a normal bowel movement for a few days.  Please Note:  You might notice some irritation and congestion in your nose or some drainage.  This is from the oxygen used during your procedure.  There is no need for concern and it should clear up in a day or so.  SYMPTOMS TO REPORT IMMEDIATELY:   Following upper endoscopy (EGD)  Vomiting of blood or coffee ground material  New chest pain or pain under the shoulder blades  Painful or persistently difficult swallowing  New shortness of breath  Fever of 100F or higher  Black, tarry-looking stools  For urgent or emergent issues, a gastroenterologist can be reached at any hour by calling [phone].   DIET:  Follow Esophageal Dilation Diet.  Drink plenty of fluids but you should avoid alcoholic beverages for 24 hours.  ACTIVITY:  You should plan to take it easy for the rest of today and you should NOT DRIVE or use heavy machinery until tomorrow (because of the sedation medicines used during the test).    FOLLOW UP: Our staff will call  the number listed on your records the next business day following your procedure to check on you and address any questions or concerns that you may have regarding the information given to you following your procedure. If we do not reach you, we will leave a message.  However, if you are feeling well and you are not experiencing any problems, there is no need to return our call.  We will assume that you have returned to your regular daily activities without incident.  If any biopsies were taken you will be contacted by phone or by letter within the next 1-3 weeks.  Please call us at [phone] if you have not heard about the biopsies in 3 weeks.  Return to GI office in 3 months   SIGNATURES/CONFIDENTIALITY: You and/or your care partner have signed paperwork which will be entered into your electronic medical record.  These signatures attest to the fact that that the information above on your After Visit Summary has been reviewed and is understood.  Full responsibility of the confidentiality of this discharge information lies with you and/or your care-partner.

## 2018-06-06 ENCOUNTER — Encounter: Payer: Self-pay | Admitting: Gastroenterology

## 2018-06-06 ENCOUNTER — Telehealth: Payer: Self-pay | Admitting: *Deleted

## 2018-06-06 NOTE — Telephone Encounter (Signed)
  Follow up Call-  Call back number 06/05/2018 04/23/2018 03/08/2018 10/31/2017 07/24/2017  Post procedure Call Back phone  # [phone]  Permission to leave phone message Yes Yes Yes Yes Yes  Some recent data might be hidden     Patient questions:  Do you have a fever, pain , or abdominal swelling? No. Pain Score  0 *  Have you tolerated food without any problems? Yes.    Have you been able to return to your normal activities? Yes.    Do you have any questions about your discharge instructions: Diet   No. Medications  No. Follow up visit  No.  Do you have questions or concerns about your Care? No.  Actions: * If pain score is 4 or above: No action needed, pain <4.

## 2018-06-06 NOTE — Telephone Encounter (Signed)
  Follow up Call-  Call back number 06/05/2018 04/23/2018 03/08/2018 10/31/2017 07/24/2017  Post procedure Call Back phone  # [phone]  Permission to leave phone message Yes Yes Yes Yes Yes  Some recent data might be hidden     Patient questions:  Do you have a fever, pain , or abdominal swelling? No. Pain Score  0 *  Have you tolerated food without any problems? Yes.    Have you been able to return to your normal activities? Yes.    Do you have any questions about your discharge instructions: Diet   No. Medications  No. Follow up visit  No.  Do you have questions or concerns about your Care? No.  Actions: * If pain score is 4 or above: No action needed, pain <4.

## 2018-10-22 ENCOUNTER — Ambulatory Visit: Payer: Self-pay

## 2018-10-22 ENCOUNTER — Encounter (HOSPITAL_COMMUNITY): Payer: Self-pay

## 2018-10-22 ENCOUNTER — Emergency Department (HOSPITAL_COMMUNITY)
Admission: EM | Admit: 2018-10-22 | Discharge: 2018-10-22 | Disposition: A | Discharge status: Home / Self Care | Payer: Self-pay | Attending: Emergency Medicine | Admitting: Emergency Medicine

## 2018-10-22 ENCOUNTER — Ambulatory Visit: Payer: Self-pay | Admitting: *Deleted

## 2018-10-22 ENCOUNTER — Other Ambulatory Visit: Payer: Self-pay

## 2018-10-22 DIAGNOSIS — D649 Anemia, unspecified: Secondary | ICD-10-CM | POA: Insufficient documentation

## 2018-10-22 DIAGNOSIS — Z79899 Other long term (current) drug therapy: Secondary | ICD-10-CM | POA: Insufficient documentation

## 2018-10-22 DIAGNOSIS — F1193 Opioid use, unspecified with withdrawal: Secondary | ICD-10-CM

## 2018-10-22 DIAGNOSIS — F1123 Opioid dependence with withdrawal: Secondary | ICD-10-CM

## 2018-10-22 DIAGNOSIS — K219 Gastro-esophageal reflux disease without esophagitis: Secondary | ICD-10-CM | POA: Insufficient documentation

## 2018-10-22 LAB — CBC WITH DIFFERENTIAL/PLATELET
Abs Immature Granulocytes: 0.03 10*3/uL (ref 0.00–0.07)
Basophils Absolute: 0 10*3/uL (ref 0.0–0.1)
Basophils Relative: 0 %
EOS ABS: 0.9 10*3/uL — AB (ref 0.0–0.5)
Eosinophils Relative: 12 %
HCT: 32.1 % — ABNORMAL LOW (ref 39.0–52.0)
Hemoglobin: 8.1 g/dL — ABNORMAL LOW (ref 13.0–17.0)
IMMATURE GRANULOCYTES: 0 %
Lymphocytes Relative: 31 %
Lymphs Abs: 2.3 10*3/uL (ref 0.7–4.0)
MCH: 16.9 pg — ABNORMAL LOW (ref 26.0–34.0)
MCHC: 25.2 g/dL — AB (ref 30.0–36.0)
MCV: 67.2 fL — ABNORMAL LOW (ref 80.0–100.0)
Monocytes Absolute: 0.6 10*3/uL (ref 0.1–1.0)
Monocytes Relative: 8 %
Neutro Abs: 3.6 10*3/uL (ref 1.7–7.7)
Neutrophils Relative %: 49 %
Platelets: 423 10*3/uL — ABNORMAL HIGH (ref 150–400)
RBC: 4.78 MIL/uL (ref 4.22–5.81)
RDW: 21.4 % — ABNORMAL HIGH (ref 11.5–15.5)
WBC: 7.4 10*3/uL (ref 4.0–10.5)
nRBC: 0 % (ref 0.0–0.2)

## 2018-10-22 LAB — COMPREHENSIVE METABOLIC PANEL
ALT: 15 U/L (ref 0–44)
AST: 21 U/L (ref 15–41)
Albumin: 3.5 g/dL (ref 3.5–5.0)
Alkaline Phosphatase: 94 U/L (ref 38–126)
Anion gap: 6 (ref 5–15)
BILIRUBIN TOTAL: 0.3 mg/dL (ref 0.3–1.2)
BUN: 14 mg/dL (ref 6–20)
CO2: 28 mmol/L (ref 22–32)
Calcium: 8.6 mg/dL — ABNORMAL LOW (ref 8.9–10.3)
Chloride: 106 mmol/L (ref 98–111)
Creatinine, Ser: 1.06 mg/dL (ref 0.61–1.24)
GFR calc Af Amer: >60 mL/min (ref >60)
GFR calc non Af Amer: >60 mL/min (ref >60)
Glucose, Bld: 124 mg/dL — ABNORMAL HIGH (ref 70–99)
POTASSIUM: 4.1 mmol/L (ref 3.5–5.1)
Sodium: 140 mmol/L (ref 135–145)
TOTAL PROTEIN: 6.9 g/dL (ref 6.5–8.1)

## 2018-10-22 LAB — POC OCCULT BLOOD, ED: Fecal Occult Bld: POSITIVE — AB

## 2018-10-22 LAB — LIPASE, BLOOD: Lipase: 30 U/L (ref 11–51)

## 2018-10-22 MED ORDER — LIDOCAINE VISCOUS HCL 2 % MT SOLN
15.0000 mL | Freq: Once | OROMUCOSAL | Status: AC
  Administered 2018-10-22: 15 mL via ORAL
  Filled 2018-10-22 (×2): qty 15

## 2018-10-22 MED ORDER — SODIUM CHLORIDE 0.9 % IV BOLUS
1000.0000 mL | Freq: Once | INTRAVENOUS | Status: AC
  Administered 2018-10-22: 1000 mL via INTRAVENOUS

## 2018-10-22 MED ORDER — CLONIDINE HCL 0.1 MG PO TABS
0.1000 mg | ORAL_TABLET | Freq: Once | ORAL | Status: DC
  Filled 2018-10-22: qty 1

## 2018-10-22 MED ORDER — PANTOPRAZOLE SODIUM 40 MG IV SOLR
40.0000 mg | Freq: Once | INTRAVENOUS | Status: AC
  Administered 2018-10-22: 40 mg via INTRAVENOUS
  Filled 2018-10-22: qty 40

## 2018-10-22 MED ORDER — ALUM & MAG HYDROXIDE-SIMETH 200-200-20 MG/5ML PO SUSP
30.0000 mL | Freq: Once | ORAL | Status: AC
  Administered 2018-10-22: 30 mL via ORAL
  Filled 2018-10-22 (×2): qty 30

## 2018-10-22 NOTE — Telephone Encounter (Signed)
Please see message. °

## 2018-10-22 NOTE — Telephone Encounter (Signed)
Called patient and he stated that he is on his way to the ER and he is going to keep his appointment for tomorrow but if he is not able to make it he will call and cancel.

## 2018-10-22 NOTE — Telephone Encounter (Signed)
Returned call to patient to inquire about hislower abdominal pain. Pt states that it is more discomfort. He states it started when he stopped taking Methadone. He says he has been just laying in bed. He is oriented. He has no vomiting or having diarrhea. Pt was advised to be seen in the Ed for withdrawal symptoms.  Pt agrees to go. Pt states when he did this in the past that he took xanax for relief. Pt states he has none and has not taken any. Pt states nobody is with him now but he feels he can drive.  Pt advised to have some drive him to the ED. If he had to calling 911 would be ok. Pt verbalized understanding.  Reason for Disposition . Patient sounds very sick or weak to the triager  Answer Assessment - Initial Assessment Questions 1. LOCATION: "Where does it hurt?"      lowerabdomine 2. RADIATION: "Does the pain shoot anywhere else?" (e.g., chest, back)     No 3. ONSET: "When did the pain begin?" (Minutes, hours or days ago)      Since quiting methadone 4. SUDDEN: "Gradual or sudden onset?"     yes 5. PATTERN "Does the pain come and go, or is it constant?"    - If constant: "Is it getting better, staying the same, or worsening?"      (Note: Constant means the pain never goes away completely; most serious pain is constant and it progresses)     - If intermittent: "How long does it last?" "Do you have pain now?"     (Note: Intermittent means the pain goes away completely between bouts)     Comes and goes 6. SEVERITY: "How bad is the pain?"  (e.g., Scale 1-10; mild, moderate, or severe)    - MILD (1-3): doesn't interfere with normal activities, abdomen soft and not tender to touch     - MODERATE (4-7): interferes with normal activities or awakens from sleep, tender to touch     - SEVERE (8-10): excruciating pain, doubled over, unable to do any normal activities       mild 7. RECURRENT SYMPTOM: "Have you ever had this type of abdominal pain before?" If so, ask: "When was the last time?" and  "What happened that time?"      no 8. CAUSE: "What do you think is causing the abdominal pain?"     Quit methadone 9. RELIEVING/AGGRAVATING FACTORS: "What makes it better or worse?" (e.g., movement, antacids, bowel movement)     Xanax  10. OTHER SYMPTOMS: "Has there been any vomiting, diarrhea, constipation, or urine problems?"       No  Protocols used: ABDOMINAL PAIN - MALE-A-AH

## 2018-10-22 NOTE — ED Notes (Signed)
Writer went to check on the patient while in triage and noted that the patient had 5 knives on his person. Patient stated, "I always have them when I come in here." Patient opted to take the knives to his car.

## 2018-10-22 NOTE — Telephone Encounter (Signed)
Agree with evaluation in ER.

## 2018-10-22 NOTE — ED Notes (Signed)
Prior to coming to [facility]. Patient had another small knife on his person. Knife was placed in security.

## 2018-10-22 NOTE — Telephone Encounter (Signed)
Last OV 04/2018, Next OV tomorrow  Please see message and advise unless you want to wait for him tomorrow for his appointment.

## 2018-10-22 NOTE — Patient Outreach (Addendum)
[facility] met with the patient to provide substance use recovery support and help with substance use treatment resources. Patient is currently experiencing methadone withdrawals. Patient states that he has taken suboxone in the past and did not like the way it made him feel. Patient states that he does not have a substance use addiction history with opioids or any other drugs/alcohol and has been taking methadone for pain management. Patient currently receives his Methadone from [facility] still provided the patient with [facility] contact information. [facility] strongly encouraged the patient to contact [facility] if would like information for other MAT providers that help with MAT in the [place] area.

## 2018-10-22 NOTE — Discharge Instructions (Signed)
Please continue taking Protonix as prescribed. You will need to speak to your primary care provider regarding your withdrawal symptoms and dosing of your methadone. Return to ED for worsening symptoms, trouble breathing, increased bleeding, lightheadedness, chest pain.

## 2018-10-22 NOTE — Telephone Encounter (Signed)
Pt reports has been weaning off Methadone. since beginning of Jan. 2019. States at that time was taking 105mg .  Two days ago weaned down to 60mg .  States can not sleep, "Feel real jittery." Reports had similar symptoms when went down to 70mg , "Worse now at 60mg ."  Pt states "I really want to get off of this methadone." Requesting medication be called in. States has taken Xanax in past which was helpful. "Even just a few until my appointment." States overdosed on valium.  Pt has appt 10/25/2018 with Dr. Elease Hashimoto.  If appropriate: [facility] in [place] [address].  Please advise: [phone]  Reason for Disposition . [1] Request for URGENT new prescription or refill of "essential" medication (i.e., likelihood of harm to patient if not taken) AND [2] triager unable to fill per unit policy  Answer Assessment - Initial Assessment Questions 1. SYMPTOMS: "Do you have any symptoms?"     Yes, weaning off methadone 2. SEVERITY: If symptoms are present, ask "Are they mild, moderate or severe?"     moderate  Protocols used: MEDICATION QUESTION CALL-A-AH

## 2018-10-22 NOTE — ED Triage Notes (Signed)
Patient states he quit taking Methadone 6 days ago. Patient c/o "feeling jittery, have sleeplessness, and feeling weak." patient states "it was my choice to come off of methadone because I was just tired of taking it."

## 2018-10-22 NOTE — Patient Outreach (Addendum)
[facility] offered the patient a residential substance use treatment detox center list to hold on to after future discharge from the [facility] to further address methadone withdrawals, but is not interested in getting connected to a residential substance use treatment detox center. 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 [facility] encouraged the patient to follow up with [facility] to assist with tapering needs on his methadone prescription.

## 2018-10-22 NOTE — ED Provider Notes (Signed)
[facility] DEPT Provider Note   CSN: [id number] Arrival date & time: 10/22/18  1707     History   Chief Complaint Chief Complaint  Patient presents with  . withdrawal from methadone    HPI Eric Hood is a 38 y.o. male with a past medical history of substance abuse currently on 70 mg of methadone, GERD, esophageal stricture, who presents to ED for evaluation of withdrawal symptoms from methadone.  He has been on methadone for the past 2 years.  He has been on 70 mg of methadone for the past several months but stopped taking it 6 days ago.  He has began feeling jittery, unable to sleep at night and feeling generally weak.  He has not talked to the provider who prescribes him methadone about discontinuing his methadone.  He states that "I am tired of taking it, I do not want to be on it anymore."  He denies any abdominal pain, chest pain, vomiting, diarrhea, fever.  He does note some heartburn related to his GERD and is requesting medication for this.  Denies any injuries or falls, numbness in arms or legs, tremors.  He denies any other drug use.  HPI  Past Medical History:  Diagnosis Date  . Anemia 10/23/2014  . Anxiety   . Back pain   . Blood transfusion without reported diagnosis april 2016  . Esophageal stricture 03/2014  . GERD (gastroesophageal reflux disease)   . Hiatal hernia   . Reflux   . Substance abuse ([place])    on methadone  . Ulcerative esophagitis 03/2014   severe    Patient Active Problem List   Diagnosis Date Noted  . Dysphagia   . Thoracic compression fracture ([place]) 10/14/2015  . Trigger point of thoracic region 09/03/2015  . Acute lung injury   . Acute respiratory failure with hypoxia ([place])   . Respiratory distress   . Respiratory failure ([place]) 05/15/2015  . Acute respiratory failure with hypoxemia ([place])   . Aspiration pneumonia ([place])   . Acute encephalopathy   . Narcotic overdose ([place])   . Loss of weight 02/10/2015    . Scaphoid fracture of wrist 01/20/2015  . Acute esophagitis   . Esophageal stricture   . Food impaction of esophagus   . Acute right lower quadrant pain 11/13/2014  . Anemia 10/23/2014  . Heme positive stool 10/23/2014  . GERD (gastroesophageal reflux disease) 10/23/2014  . Right-sided chest/RUQ pain 10/23/2014  . History of esophagitis and stricture 10/23/2014  . Anxiety state, unspecified 06/15/2014  . Scapular dysfunction 03/29/2014  . Nonallopathic lesion of thoracic region 03/29/2014  . Nonallopathic lesion of cervical region 03/29/2014  . Nonallopathic lesion-rib cage 03/29/2014  . Nonallopathic lesion of sacral region 03/29/2014  . Nonallopathic lesion of lumbosacral region 03/29/2014  . Dysplastic nevus of trunk 03/07/2013  . Thoracic back pain 02/11/2013    Past Surgical History:  Procedure Laterality Date  . BALLOON DILATION N/A 05/20/2015   Procedure: BALLOON DILATION;  Surgeon: Milus Banister, MD;  Location: [facility];  Service: Endoscopy;  Laterality: N/A;  . COLONOSCOPY    . ESOPHAGEAL DILATION     On 3 occasions: 03/2014, 08/2014, 09/2014  . ESOPHAGOGASTRODUODENOSCOPY N/A 05/20/2015   Procedure: ESOPHAGOGASTRODUODENOSCOPY (EGD);  Surgeon: Milus Banister, MD;  Location: [facility];  Service: Endoscopy;  Laterality: N/A;  . ESOPHAGOGASTRODUODENOSCOPY (EGD) WITH PROPOFOL N/A 01/18/2015   Procedure: ESOPHAGOGASTRODUODENOSCOPY (EGD) WITH PROPOFOL;  Surgeon: Lafayette Dragon, MD;  Location: [facility];  Service: Endoscopy;  Laterality: N/A;  . ESOPHAGOGASTRODUODENOSCOPY (EGD) WITH PROPOFOL N/A 09/04/2017   Procedure: ESOPHAGOGASTRODUODENOSCOPY (EGD) WITH PROPOFOL;  Surgeon: Ladene Artist, MD;  Location: [facility];  Service: Endoscopy;  Laterality: N/A;  need fluro  . ESOPHAGOGASTRODUODENOSCOPY (EGD) WITH PROPOFOL N/A 10/01/2017   Procedure: ESOPHAGOGASTRODUODENOSCOPY (EGD) WITH PROPOFOL;  Surgeon: Ladene Artist, MD;  Location: [facility];  Service: Endoscopy;   Laterality: N/A;  . HAND SURGERY Right   . SAVORY DILATION N/A 09/04/2017   Procedure: SAVORY DILATION;  Surgeon: Ladene Artist, MD;  Location: [facility];  Service: Endoscopy;  Laterality: N/A;  . SAVORY DILATION N/A 10/01/2017   Procedure: SAVORY DILATION;  Surgeon: Ladene Artist, MD;  Location: [facility];  Service: Endoscopy;  Laterality: N/A;  . UPPER GASTROINTESTINAL ENDOSCOPY          Home Medications    Prior to Admission medications   Medication Sig Start Date End Date Taking? Authorizing Provider  methadone (DOLOPHINE) 10 MG/ML solution Take 70 mg by mouth daily.    Yes [provider]  pantoprazole (PROTONIX) 40 MG tablet Take 1 tablet (40 mg total) by mouth 2 (two) times daily before a meal. 03/05/18  Yes Ladene Artist, MD  ranitidine (ZANTAC) 300 MG tablet Take 1 tablet (300 mg total) by mouth at bedtime. 03/05/18  Yes Ladene Artist, MD  Tetrahydrozoline HCl (VISINE OP) Apply 1 drop to eye daily as needed (dry eyes).   Yes [provider]    Family History Family History  Problem Relation Age of Onset  . Hypertension Mother   . Hypertension Father   . Colon cancer Neg Hx   . Esophageal cancer Neg Hx   . Rectal cancer Neg Hx   . Stomach cancer Neg Hx   . Colon polyps Neg Hx   . Liver cancer Neg Hx   . Pancreatic cancer Neg Hx     Social History Social History   Tobacco Use  . Smoking status: Never Smoker  . Smokeless tobacco: Never Used  Substance Use Topics  . Alcohol use: Yes    Comment: very rarely  . Drug use: Not Currently    Comment: currently not taking Methadone     Allergies   Hydrocodone   Review of Systems Review of Systems  Constitutional: Positive for fatigue. Negative for appetite change, chills and fever.  HENT: Negative for ear pain, rhinorrhea, sneezing and sore throat.   Eyes: Negative for photophobia and visual disturbance.  Respiratory: Negative for cough, chest tightness, shortness of breath  and wheezing.   Cardiovascular: Negative for chest pain and palpitations.  Gastrointestinal: Negative for abdominal pain, blood in stool, constipation, diarrhea, nausea and vomiting.  Genitourinary: Negative for dysuria, hematuria and urgency.  Musculoskeletal: Negative for myalgias.  Skin: Negative for rash.  Neurological: Negative for dizziness, weakness and light-headedness.  Psychiatric/Behavioral: Positive for sleep disturbance. The patient is nervous/anxious.      Physical Exam Updated Vital Signs BP 107/67   Pulse 97   Temp 98.1 F (36.7 C) (Oral)   Resp 11   Ht 6' (1.829 m)   Wt 83.9 kg   SpO2 98%   BMI 25.09 kg/m   Physical Exam Vitals signs and nursing note reviewed.  Constitutional:      General: He is not in acute distress.    Appearance: He is well-developed.  HENT:     Head: Normocephalic and atraumatic.     Nose: Nose normal.  Eyes:  General: No scleral icterus.       Right eye: No discharge.        Left eye: No discharge.     Conjunctiva/sclera: Conjunctivae normal.  Neck:     Musculoskeletal: Normal range of motion and neck supple.  Cardiovascular:     Rate and Rhythm: Normal rate and regular rhythm.     Heart sounds: Normal heart sounds. No murmur. No friction rub. No gallop.   Pulmonary:     Effort: Pulmonary effort is normal. No respiratory distress.     Breath sounds: Normal breath sounds.  Abdominal:     General: Bowel sounds are normal. There is no distension.     Palpations: Abdomen is soft.     Tenderness: There is no abdominal tenderness. There is no guarding.     Comments: Abdomen soft, nontender and nondistended.  Genitourinary:    Rectum: Guaiac result positive. No tenderness.     Comments: Stool not grossly hemoccult positive. Musculoskeletal: Normal range of motion.  Skin:    General: Skin is warm and dry.     Findings: No rash.  Neurological:     Mental Status: He is alert.     Motor: No abnormal muscle tone.      Coordination: Coordination normal.      ED Treatments / Results  Labs (all labs ordered are listed, but only abnormal results are displayed) Labs Reviewed  COMPREHENSIVE METABOLIC PANEL - Abnormal; Notable for the following components:      Result Value   Glucose, Bld 124 (*)    Calcium 8.6 (*)    All other components within normal limits  CBC WITH DIFFERENTIAL/PLATELET - Abnormal; Notable for the following components:   Hemoglobin 8.1 (*)    HCT 32.1 (*)    MCV 67.2 (*)    MCH 16.9 (*)    MCHC 25.2 (*)    RDW 21.4 (*)    Platelets 423 (*)    Eosinophils Absolute 0.9 (*)    All other components within normal limits  POC OCCULT BLOOD, ED - Abnormal; Notable for the following components:   Fecal Occult Bld POSITIVE (*)    All other components within normal limits  LIPASE, BLOOD    EKG None  Radiology No results found.  Procedures Procedures (including critical care time)  Medications Ordered in ED Medications  alum & mag hydroxide-simeth (MAALOX/MYLANTA) 200-200-20 MG/5ML suspension 30 mL (30 mLs Oral Given 10/22/18 2028)    And  lidocaine (XYLOCAINE) 2 % viscous mouth solution 15 mL (15 mLs Oral Given 10/22/18 2028)  sodium chloride 0.9 % bolus 1,000 mL (1,000 mLs Intravenous New Bag/Given 10/22/18 2104)  pantoprazole (PROTONIX) injection 40 mg (40 mg Intravenous Given 10/22/18 2233)     Initial Impression / Assessment and Plan / ED Course  I have reviewed the triage vital signs and the nursing notes.  Pertinent labs & imaging results that were available during my care of the patient were reviewed by me and considered in my medical decision making (see chart for details).  Clinical Course as of Oct 23 2311  [place] Oct 22, 2018  2014 RN states that patient is refusing all medications.  I then spoke to the patient who states that he has been having generalized weakness and felt similar when he required a blood transfusion in the past.  He denies any rectal bleeding but does  note one episode of dark-colored emesis last week.  Patient did not tell me any of this  information during my initial evaluation, as he stated that his symptoms are related to his withdrawal.  Lab work, give fluids and GI cocktail and reassess.   [HK]  2039 Hemoglobin(!): 8.1 [HK]  2251 Fecal Occult Blood, POC(!): POSITIVE [HK]  2251 Patient with improvement in symptoms with IV fluids.   [HK]  2253 Patient given IV protonix for what appears to be a slow GI bleed, with his history of ulcers. He is HDS here, hgb seems to be at baseline 8-9 based on past readings. At the time of transfusion in 2016, his hemoglobin was 6.2.  He notes continued improvement with fluids.   [HK]  2254 Will have patient follow-up with PCP regarding withdrawal, and follow-up with GI regarding his bleeding.  We will ask him to return to ED for any severe worsening symptoms.   [HK]    Clinical Course User Index [HK] Delia Heady, PA-C     Final Clinical Impressions(s) / ED Diagnoses   Final diagnoses:  Anemia, unspecified type  Opiate withdrawal [facility])    ED Discharge Orders    None       Delia Heady, PA-C 10/22/18 2313    Tegeler, Gwenyth Allegra, MD 10/22/18 [phone]

## 2018-10-22 NOTE — Telephone Encounter (Signed)
This is WAY to rapid to reduce his Methadone.  Methadone tapering needs to be done by his pain management specialist and done very gradually under their guidance.  We will not be able to prescribe any benzodiazepines- especially in setting of high dose Methadone.

## 2018-10-23 ENCOUNTER — Ambulatory Visit: Payer: Self-pay | Admitting: Family Medicine

## 2018-10-23 NOTE — Telephone Encounter (Signed)
Called patient and LMOVM to return call  Eric Hood for [facility] to Discuss results / PCP / recommendations / Schedule patient  Per Dr. Elease Hashimoto: Patient does not need to follow up with him from ER visit, patient has to go to the [facility] clinic to taper off of this medication. Also Dr. Elease Hashimoto stated that he needs to follow up with his GI doctor for the anemia.  CRM Created.

## 2018-10-25 ENCOUNTER — Ambulatory Visit: Payer: Self-pay | Admitting: Family Medicine

## 2018-11-19 ENCOUNTER — Ambulatory Visit (INDEPENDENT_AMBULATORY_CARE_PROVIDER_SITE_OTHER): Payer: Self-pay | Admitting: Family Medicine

## 2018-11-19 ENCOUNTER — Observation Stay (HOSPITAL_COMMUNITY)
Admission: EM | Admit: 2018-11-19 | Discharge: 2018-11-21 | Disposition: A | Discharge status: Home / Self Care | Payer: Self-pay | Attending: Internal Medicine | Admitting: Internal Medicine

## 2018-11-19 ENCOUNTER — Other Ambulatory Visit: Payer: Self-pay

## 2018-11-19 ENCOUNTER — Emergency Department (HOSPITAL_COMMUNITY): Payer: Self-pay

## 2018-11-19 ENCOUNTER — Encounter: Payer: Self-pay | Admitting: Family Medicine

## 2018-11-19 ENCOUNTER — Telehealth: Payer: Self-pay | Admitting: *Deleted

## 2018-11-19 ENCOUNTER — Encounter (HOSPITAL_COMMUNITY): Payer: Self-pay | Admitting: Emergency Medicine

## 2018-11-19 VITALS — BP 120/62 | HR 119 | Temp 99.6°F | Ht 72.0 in | Wt 179.2 lb

## 2018-11-19 DIAGNOSIS — Z9119 Patient's noncompliance with other medical treatment and regimen: Secondary | ICD-10-CM | POA: Insufficient documentation

## 2018-11-19 DIAGNOSIS — Z862 Personal history of diseases of the blood and blood-forming organs and certain disorders involving the immune mechanism: Secondary | ICD-10-CM

## 2018-11-19 DIAGNOSIS — Z885 Allergy status to narcotic agent status: Secondary | ICD-10-CM | POA: Insufficient documentation

## 2018-11-19 DIAGNOSIS — R509 Fever, unspecified: Secondary | ICD-10-CM | POA: Insufficient documentation

## 2018-11-19 DIAGNOSIS — F1120 Opioid dependence, uncomplicated: Secondary | ICD-10-CM

## 2018-11-19 DIAGNOSIS — R197 Diarrhea, unspecified: Secondary | ICD-10-CM

## 2018-11-19 DIAGNOSIS — Z79899 Other long term (current) drug therapy: Secondary | ICD-10-CM | POA: Insufficient documentation

## 2018-11-19 DIAGNOSIS — R531 Weakness: Secondary | ICD-10-CM | POA: Insufficient documentation

## 2018-11-19 DIAGNOSIS — D649 Anemia, unspecified: Secondary | ICD-10-CM

## 2018-11-19 DIAGNOSIS — Z8719 Personal history of other diseases of the digestive system: Secondary | ICD-10-CM

## 2018-11-19 DIAGNOSIS — D509 Iron deficiency anemia, unspecified: Principal | ICD-10-CM | POA: Insufficient documentation

## 2018-11-19 DIAGNOSIS — K2100 Gastro-esophageal reflux disease with esophagitis, without bleeding: Secondary | ICD-10-CM

## 2018-11-19 DIAGNOSIS — G894 Chronic pain syndrome: Secondary | ICD-10-CM | POA: Insufficient documentation

## 2018-11-19 DIAGNOSIS — N179 Acute kidney failure, unspecified: Secondary | ICD-10-CM | POA: Insufficient documentation

## 2018-11-19 DIAGNOSIS — R062 Wheezing: Secondary | ICD-10-CM

## 2018-11-19 DIAGNOSIS — R1310 Dysphagia, unspecified: Secondary | ICD-10-CM

## 2018-11-19 DIAGNOSIS — F5104 Psychophysiologic insomnia: Secondary | ICD-10-CM

## 2018-11-19 DIAGNOSIS — Z79891 Long term (current) use of opiate analgesic: Secondary | ICD-10-CM | POA: Insufficient documentation

## 2018-11-19 DIAGNOSIS — R7989 Other specified abnormal findings of blood chemistry: Secondary | ICD-10-CM | POA: Insufficient documentation

## 2018-11-19 LAB — CBC WITH DIFFERENTIAL/PLATELET
Basophils Absolute: 0 10*3/uL (ref 0.0–0.1)
Basophils Relative: 0.4 % (ref 0.0–3.0)
Eosinophils Absolute: 0.4 10*3/uL (ref 0.0–0.7)
Eosinophils Relative: 4.6 % (ref 0.0–5.0)
HCT: 26.6 % — ABNORMAL LOW (ref 39.0–52.0)
Hemoglobin: 7.8 g/dL — CL (ref 13.0–17.0)
Lymphocytes Relative: 14.9 % (ref 12.0–46.0)
Lymphs Abs: 1.3 10*3/uL (ref 0.7–4.0)
MCHC: 29.3 g/dL — ABNORMAL LOW (ref 30.0–36.0)
MCV: 57.8 fl — ABNORMAL LOW (ref 78.0–100.0)
Monocytes Absolute: 0.5 10*3/uL (ref 0.1–1.0)
Monocytes Relative: 6.4 % (ref 3.0–12.0)
Neutro Abs: 6.3 10*3/uL (ref 1.4–7.7)
Neutrophils Relative %: 73.7 % (ref 43.0–77.0)
Platelets: 348 10*3/uL (ref 150.0–400.0)
RBC: 4.61 Mil/uL (ref 4.22–5.81)
RDW: 20.5 % — ABNORMAL HIGH (ref 11.5–15.5)
WBC: 8.5 10*3/uL (ref 4.0–10.5)

## 2018-11-19 LAB — CBC
HCT: 28.1 % — ABNORMAL LOW (ref 39.0–52.0)
HEMOGLOBIN: 7.3 g/dL — AB (ref 13.0–17.0)
MCH: 16.9 pg — ABNORMAL LOW (ref 26.0–34.0)
MCHC: 26 g/dL — ABNORMAL LOW (ref 30.0–36.0)
MCV: 65 fL — ABNORMAL LOW (ref 80.0–100.0)
Platelets: 351 10*3/uL (ref 150–400)
RBC: 4.32 MIL/uL (ref 4.22–5.81)
RDW: 20.9 % — ABNORMAL HIGH (ref 11.5–15.5)
WBC: 8.2 10*3/uL (ref 4.0–10.5)
nRBC: 0 % (ref 0.0–0.2)

## 2018-11-19 LAB — COMPREHENSIVE METABOLIC PANEL
ALK PHOS: 97 U/L (ref 38–126)
ALT: 18 U/L (ref 0–44)
ANION GAP: 8 (ref 5–15)
AST: 21 U/L (ref 15–41)
Albumin: 3.7 g/dL (ref 3.5–5.0)
BUN: 13 mg/dL (ref 6–20)
CALCIUM: 8.7 mg/dL — AB (ref 8.9–10.3)
CO2: 26 mmol/L (ref 22–32)
Chloride: 104 mmol/L (ref 98–111)
Creatinine, Ser: 1.25 mg/dL — ABNORMAL HIGH (ref 0.61–1.24)
GFR calc Af Amer: >60 mL/min (ref >60)
GFR calc non Af Amer: >60 mL/min (ref >60)
Glucose, Bld: 99 mg/dL (ref 70–99)
POTASSIUM: 3.6 mmol/L (ref 3.5–5.1)
Sodium: 138 mmol/L (ref 135–145)
TOTAL PROTEIN: 7.3 g/dL (ref 6.5–8.1)
Total Bilirubin: 0.2 mg/dL — ABNORMAL LOW (ref 0.3–1.2)

## 2018-11-19 LAB — PREPARE RBC (CROSSMATCH)

## 2018-11-19 LAB — INFLUENZA PANEL BY PCR (TYPE A & B)
Influenza A By PCR: NEGATIVE
Influenza B By PCR: NEGATIVE

## 2018-11-19 LAB — POC OCCULT BLOOD, ED: Fecal Occult Bld: NEGATIVE

## 2018-11-19 MED ORDER — ACETAMINOPHEN 325 MG PO TABS: 650.0000 mg | ORAL_TABLET | Freq: Four times a day (QID) | ORAL | Status: DC | PRN

## 2018-11-19 MED ORDER — CLONIDINE HCL 0.1 MG PO TABS
0.1000 mg | ORAL_TABLET | Freq: Once | ORAL | Status: DC
  Filled 2018-11-19 (×2): qty 1

## 2018-11-19 MED ORDER — HYDROXYZINE HCL 25 MG PO TABS
25.0000 mg | ORAL_TABLET | Freq: Every day | ORAL | Status: DC
  Administered 2018-11-19 – 2018-11-20 (×2): 25 mg via ORAL
  Filled 2018-11-19 (×2): qty 1

## 2018-11-19 MED ORDER — ACETAMINOPHEN 500 MG PO TABS
1000.0000 mg | ORAL_TABLET | Freq: Once | ORAL | Status: AC
  Administered 2018-11-19: 1000 mg via ORAL
  Filled 2018-11-19: qty 2

## 2018-11-19 MED ORDER — ONDANSETRON HCL 4 MG/2ML IJ SOLN
4.0000 mg | Freq: Once | INTRAMUSCULAR | Status: AC
  Administered 2018-11-19: 4 mg via INTRAVENOUS
  Filled 2018-11-19: qty 2

## 2018-11-19 MED ORDER — METHADONE HCL 10 MG PO TABS
70.0000 mg | ORAL_TABLET | Freq: Once | ORAL | Status: AC
  Administered 2018-11-19: 70 mg via ORAL
  Filled 2018-11-19: qty 7

## 2018-11-19 MED ORDER — HYDROXYZINE HCL 25 MG PO TABS: ORAL_TABLET | ORAL | 1 refills | Status: DC

## 2018-11-19 MED ORDER — PANTOPRAZOLE SODIUM 40 MG IV SOLR
40.0000 mg | Freq: Once | INTRAVENOUS | Status: DC
  Filled 2018-11-19 (×2): qty 40

## 2018-11-19 MED ORDER — ACETAMINOPHEN 325 MG PO TABS
650.0000 mg | ORAL_TABLET | Freq: Once | ORAL | Status: AC
  Administered 2018-11-19: 650 mg via ORAL
  Filled 2018-11-19: qty 2

## 2018-11-19 MED ORDER — SODIUM CHLORIDE 0.9% IV SOLUTION
Freq: Once | INTRAVENOUS | Status: AC
  Administered 2018-11-19: 22:00:00 via INTRAVENOUS

## 2018-11-19 MED ORDER — LACTATED RINGERS IV BOLUS
1000.0000 mL | Freq: Once | INTRAVENOUS | Status: AC
  Administered 2018-11-19: 1000 mL via INTRAVENOUS

## 2018-11-19 MED ORDER — DIPHENHYDRAMINE HCL 25 MG PO CAPS
25.0000 mg | ORAL_CAPSULE | Freq: Once | ORAL | Status: AC
  Administered 2018-11-19: 25 mg via ORAL
  Filled 2018-11-19: qty 1

## 2018-11-19 MED ORDER — PANTOPRAZOLE SODIUM 40 MG PO TBEC
40.0000 mg | DELAYED_RELEASE_TABLET | Freq: Two times a day (BID) | ORAL | Status: DC
  Administered 2018-11-20 – 2018-11-21 (×4): 40 mg via ORAL
  Filled 2018-11-19 (×4): qty 1

## 2018-11-19 MED ORDER — PANTOPRAZOLE SODIUM 40 MG IV SOLR: 40.0000 mg | Freq: Once | INTRAVENOUS | Status: DC

## 2018-11-19 MED ORDER — ACETAMINOPHEN 650 MG RE SUPP: 650.0000 mg | Freq: Four times a day (QID) | RECTAL | Status: DC | PRN

## 2018-11-19 MED ORDER — FAMOTIDINE 20 MG PO TABS
40.0000 mg | ORAL_TABLET | Freq: Every day | ORAL | Status: DC
  Administered 2018-11-19 – 2018-11-20 (×2): 40 mg via ORAL
  Filled 2018-11-19 (×2): qty 2

## 2018-11-19 MED ORDER — PROMETHAZINE HCL 25 MG PO TABS: 25.0000 mg | ORAL_TABLET | Freq: Four times a day (QID) | ORAL | Status: DC | PRN

## 2018-11-19 NOTE — ED Provider Notes (Addendum)
[facility] DEPT Provider Note   CSN: [id number] Arrival date & time: 11/19/18  1501     History   Chief Complaint Chief Complaint  Patient presents with  . Abnormal Lab  . Weakness    HPI Trelyn Vanderlinde is a 38 y.o. male.  The history is provided by the patient.  Illness  Location:  General Severity:  Moderate Onset quality:  Gradual Duration:  2 days Timing:  Constant Chronicity:  New Context:  Patient with general fatigue over last two days, worse today with some body aches. Patient saw PCP and Hb was 7.8. Pt denies melena or bloody stool, hx of ulcers and transfusion years ago. Has had cough and sputum production.  Relieved by:  Nothing Worsened by:  Nothing  Associated symptoms: cough, diarrhea, fatigue and nausea   Associated symptoms: no abdominal pain, no chest pain, no congestion, no ear pain, no fever, no headaches, no myalgias, no rash, no rhinorrhea, no shortness of breath, no sore throat, no vomiting and no wheezing     Past Medical History:  Diagnosis Date  . Anemia 10/23/2014  . Anxiety   . Back pain   . Blood transfusion without reported diagnosis april 2016  . Esophageal stricture 03/2014  . GERD (gastroesophageal reflux disease)   . Hiatal hernia   . Reflux   . Substance abuse ([place])    on methadone  . Ulcerative esophagitis 03/2014   severe    Patient Active Problem List   Diagnosis Date Noted  . 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 Fever 11/19/2018  . Acute on chronic anemia 11/19/2018  . Dysphagia   . Thoracic compression fracture ([place]) 10/14/2015  . Trigger point of thoracic region 09/03/2015  . Acute lung injury   . Acute respiratory failure with hypoxia ([place])   . Respiratory distress   . Respiratory failure ([place]) 05/15/2015  . Acute respiratory failure with hypoxemia ([place])   . Aspiration pneumonia ([place])   . Acute encephalopathy   . Narcotic overdose ([place])   . Loss of weight 02/10/2015  .  Scaphoid fracture of wrist 01/20/2015  . Acute esophagitis   . Esophageal stricture   . Food impaction of esophagus   . Acute right lower quadrant pain 11/13/2014  . Anemia 10/23/2014  . Heme positive stool 10/23/2014  . GERD (gastroesophageal reflux disease) 10/23/2014  . Right-sided chest/RUQ pain 10/23/2014  . History of esophagitis and stricture 10/23/2014  . Anxiety state, unspecified 06/15/2014  . Scapular dysfunction 03/29/2014  . Nonallopathic lesion of thoracic region 03/29/2014  . Nonallopathic lesion of cervical region 03/29/2014  . Nonallopathic lesion-rib cage 03/29/2014  . Nonallopathic lesion of sacral region 03/29/2014  . Nonallopathic lesion of lumbosacral region 03/29/2014  . Dysplastic nevus of trunk 03/07/2013  . Thoracic back pain 02/11/2013    Past Surgical History:  Procedure Laterality Date  . BALLOON DILATION N/A 05/20/2015   Procedure: BALLOON DILATION;  Surgeon: Milus Banister, MD;  Location: [facility];  Service: Endoscopy;  Laterality: N/A;  . COLONOSCOPY    . ESOPHAGEAL DILATION     On 3 occasions: 03/2014, 08/2014, 09/2014  . ESOPHAGOGASTRODUODENOSCOPY N/A 05/20/2015   Procedure: ESOPHAGOGASTRODUODENOSCOPY (EGD);  Surgeon: Milus Banister, MD;  Location: [facility];  Service: Endoscopy;  Laterality: N/A;  . ESOPHAGOGASTRODUODENOSCOPY (EGD) WITH PROPOFOL N/A 01/18/2015   Procedure: ESOPHAGOGASTRODUODENOSCOPY (EGD) WITH PROPOFOL;  Surgeon: Lafayette Dragon, MD;  Location: [facility];  Service: Endoscopy;  Laterality: N/A;  . ESOPHAGOGASTRODUODENOSCOPY (EGD) WITH  PROPOFOL N/A 09/04/2017   Procedure: ESOPHAGOGASTRODUODENOSCOPY (EGD) WITH PROPOFOL;  Surgeon: Ladene Artist, MD;  Location[facility];  Service: Endoscopy;  Laterality: N/A;  need fluro  . ESOPHAGOGASTRODUODENOSCOPY (EGD) WITH PROPOFOL N/A 10/01/2017   Procedure: ESOPHAGOGASTRODUODENOSCOPY (EGD) WITH PROPOFOL;  Surgeon: Ladene Artist, MD;  [facility];  Service: Endoscopy;   Laterality: N/A;  . HAND SURGERY Right   . SAVORY DILATION N/A 09/04/2017   Procedure: SAVORY DILATION;  Surgeon: Ladene Artist, MD;  [facility];  Service: Endoscopy;  Laterality: N/A;  . SAVORY DILATION N/A 10/01/2017   Procedure: SAVORY DILATION;  Surgeon: Ladene Artist, MD;  Location[facility];  Service: Endoscopy;  Laterality: N/A;  . UPPER GASTROINTESTINAL ENDOSCOPY          Home Medications    Prior to Admission medications   Medication Sig Start Date End Date Taking? Authorizing Provider  methadone (DOLOPHINE) 10 MG/ML solution Take 70 mg by mouth daily.    Yes [provider]  pantoprazole (PROTONIX) 40 MG tablet Take 1 tablet (40 mg total) by mouth 2 (two) times daily before a meal. Patient taking differently: Take 40 mg by mouth daily as needed (reflux).  03/05/18  Yes Ladene Artist, MD  promethazine (PHENERGAN) 25 MG tablet Take 25 mg by mouth every 6 (six) hours as needed for nausea or vomiting.   Yes [provider]  ranitidine (ZANTAC) 300 MG tablet Take 1 tablet (300 mg total) by mouth at bedtime. 03/05/18  Yes Ladene Artist, MD  Tetrahydrozoline HCl (VISINE OP) Apply 1 drop to eye daily as needed (dry eyes).   Yes [provider]  hydrOXYzine (ATARAX/VISTARIL) 25 MG tablet Take one to two tablets qhs prn insomnia. 11/19/18   Burchette, Alinda Sierras, MD    Family History Family History  Problem Relation Age of Onset  . Hypertension Mother   . Hypertension Father   . Colon cancer Neg Hx   . Esophageal cancer Neg Hx   . Rectal cancer Neg Hx   . Stomach cancer Neg Hx   . Colon polyps Neg Hx   . Liver cancer Neg Hx   . Pancreatic cancer Neg Hx     Social History Social History   Tobacco Use  . Smoking status: Never Smoker  . Smokeless tobacco: Never Used  Substance Use Topics  . Alcohol use: Yes    Comment: very rarely  . Drug use: Not Currently    Comment: currently not taking Methadone     Allergies     Hydrocodone   Review of Systems Review of Systems  Constitutional: Positive for fatigue. Negative for chills and fever.  HENT: Negative for congestion, ear pain, rhinorrhea and sore throat.   Eyes: Negative for pain and visual disturbance.  Respiratory: Positive for cough. Negative for shortness of breath and wheezing.   Cardiovascular: Negative for chest pain and palpitations.  Gastrointestinal: Positive for diarrhea and nausea. Negative for abdominal pain and vomiting.  Genitourinary: Negative for dysuria and hematuria.  Musculoskeletal: Negative for arthralgias, back pain and myalgias.  Skin: Negative for color change and rash.  Neurological: Negative for seizures, syncope and headaches.  All other systems reviewed and are negative.    Physical Exam Updated Vital Signs  ED Triage Vitals  Enc Vitals Group     BP 11/19/18 1509 109/76     Pulse Rate 11/19/18 1509 (!) 109     Resp 11/19/18 1509 16     Temp  11/19/18 1509 98.3 F (36.8 C)     Temp Source 11/19/18 1543 Rectal     SpO2 11/19/18 1509 99 %     Weight 11/19/18 1612 185 lb (83.9 kg)     Height 11/19/18 1612 6' (1.829 m)     Head Circumference --      Peak Flow --      Pain Score 11/19/18 1514 8     Pain Loc --      Pain Edu? --      Excl. in [place]? --     Physical Exam Vitals signs and nursing note reviewed.  Constitutional:      General: He is not in acute distress.    Appearance: He is well-developed. He is ill-appearing.  HENT:     Head: Normocephalic and atraumatic.     Nose: Nose normal.     Mouth/Throat:     Mouth: Mucous membranes are moist.  Eyes:     Extraocular Movements: Extraocular movements intact.     Conjunctiva/sclera: Conjunctivae normal.     Pupils: Pupils are equal, round, and reactive to light.  Neck:     Musculoskeletal: Normal range of motion and neck supple. No neck rigidity or muscular tenderness.  Cardiovascular:     Rate and Rhythm: Regular rhythm. Tachycardia present.      Pulses: Normal pulses.     Heart sounds: Normal heart sounds. No murmur.  Pulmonary:     Effort: Pulmonary effort is normal. No respiratory distress.     Breath sounds: Normal breath sounds.  Abdominal:     General: There is no distension.     Palpations: Abdomen is soft.     Tenderness: There is no abdominal tenderness.  Genitourinary:    Rectum: Guaiac result negative.  Musculoskeletal:     Right lower leg: No edema.     Left lower leg: No edema.  Skin:    General: Skin is warm and dry.     Capillary Refill: Capillary refill takes less than 2 seconds.     Coloration: Skin is pale.  Neurological:     General: No focal deficit present.     Mental Status: He is alert and oriented to person, place, and time.     Cranial Nerves: No cranial nerve deficit.     Sensory: No sensory deficit.     Motor: No weakness.     Coordination: Coordination normal.  Psychiatric:        Mood and Affect: Mood normal.      ED Treatments / Results  Labs (all labs ordered are listed, but only abnormal results are displayed) Labs Reviewed  COMPREHENSIVE METABOLIC PANEL - Abnormal; Notable for the following components:      Result Value   Creatinine, Ser 1.25 (*)    Calcium 8.7 (*)    Total Bilirubin 0.2 (*)    All other components within normal limits  CBC - Abnormal; Notable for the following components:   Hemoglobin 7.3 (*)    HCT 28.1 (*)    MCV 65.0 (*)    MCH 16.9 (*)    MCHC 26.0 (*)    RDW 20.9 (*)    All other components within normal limits  URINE CULTURE  CULTURE, BLOOD (ROUTINE X 2)  CULTURE, BLOOD (ROUTINE X 2)  INFLUENZA PANEL BY PCR (TYPE A & B)  URINALYSIS, ROUTINE W REFLEX MICROSCOPIC  RAPID URINE DRUG SCREEN, HOSP PERFORMED  POC OCCULT BLOOD, ED  I-STAT CG4 LACTIC ACID,  ED  TYPE AND SCREEN    EKG None  Radiology Dg Chest 2 View  Result Date: 11/19/2018 CLINICAL DATA:  38 year old male with anemia.  Weakness.  Cough. EXAM: CHEST - 2 VIEW COMPARISON:  Chest  radiographs 05/18/2015 and earlier. FINDINGS: Normal lung volumes and mediastinal contours. Visualized tracheal air column is within normal limits. Both lungs are clear. No pneumothorax or pleural effusion. No acute osseous abnormality identified. Negative visible bowel gas pattern. IMPRESSION: Negative.  No cardiopulmonary abnormality. Electronically Signed   By: Genevie Ann M.D.   On: 11/19/2018 16:38    Procedures .Critical Care Performed by: Lennice Sites, DO Authorized by: Lennice Sites, DO   Critical care provider statement:    Critical care time (minutes):  40   Critical care was necessary to treat or prevent imminent or life-threatening deterioration of the following conditions:  Circulatory failure (symptomatic anemia, possible sepsis)   Critical care was time spent personally by me on the following activities:  Development of treatment plan with patient or surrogate, blood draw for specimens, discussions with consultants, discussions with primary provider, evaluation of patient's response to treatment, obtaining history from patient or surrogate, examination of patient, ordering and performing treatments and interventions, ordering and review of laboratory studies, ordering and review of radiographic studies, pulse oximetry, re-evaluation of patient's condition and review of old charts   I assumed direction of critical care for this patient from another provider in my specialty: no     (including critical care time)  Medications Ordered in ED Medications  cloNIDine (CATAPRES) tablet 0.1 mg (has no administration in time range)  pantoprazole (PROTONIX) injection 40 mg (has no administration in time range)  lactated ringers bolus 1,000 mL ( Intravenous Stopped 11/19/18 1812)  acetaminophen (TYLENOL) tablet 1,000 mg (1,000 mg Oral Given 11/19/18 1603)  ondansetron (ZOFRAN) injection 4 mg (4 mg Intravenous Given 11/19/18 1603)     Initial Impression / Assessment and Plan / ED Course  I have  reviewed the triage vital signs and the nursing notes.  Pertinent labs & imaging results that were available during my care of the patient were reviewed by me and considered in my medical decision making (see chart for details).  Clinical Course as of Nov 19 1905  Tue Nov 19, 2018  1804 DG Chest 2 View [GS]    Clinical Course User Index [GS] Bernerd Pho, Student-PA    Garet Hooton is a 38 year old male with history of substance abuse, reflux, esophageal stricture who presents to the ED with fatigue, anemia.  Patient with mild tachycardia upon arrival but otherwise normal vitals.  Upon my evaluation patient felt warm and did a rectal temperature that was 101.3.  Patient states that he saw his primary care doctor today for general fatigue, insomnia.  Patient was prescribed Vistaril and had blood work drawn.  Hemoglobin was 7.8.  Recent hemoglobin about a month ago was 8.1.  Patient has had significant GI history and does have GI follow-up this Friday.  Patient overall has vague symptoms.  States that he is has some diarrhea, general fatigue.  Denies any drug use, alcohol use.  He does appear pale.  Rectal exam did not show any melena or hematochezia.  Occult test was negative.  Patient with no abdominal pain.  He does state that he has had a cough.  Differential is wide with possible methadone withdrawal versus symptomatic anemia versus viral process.  Denies any urinary symptoms.  Will obtain infectious work-up with chest  x-ray, urinalysis, blood cultures.  No concern for meningitis.  Patient has normal neurological exam.  Patient with no significant leukocytosis.  No significant electrolyte abnormality.  Repeat hemoglobin is 7.3.  Chest x-ray showed no signs of pneumonia.  Influenza testing negative.  Patient given fluid bolus, Zofran, Tylenol with some improvement.  Overall patient with multiple etiologies of his symptoms today.  Discussed case with Dr. Silvio Pate with GI who states that if  patient is admitted they will evaluate him but patient likely can follow-up outpatient given no melena or hematochezia.  Overall patient with fever of overall unknown origin but suspect likely viral process. Possible symptomatic anemia. Having difficulty tolerating p.o. Possible withdrawal symptoms as well.  Given a dose of clonidine and admitted to hospital service to follow-up cultures.  Will hold off on antibiotics at this time.  Admitted in stable condition.  This chart was dictated using voice recognition software.  Despite best efforts to proofread,  errors can occur which can change the documentation meaning.    Final Clinical Impressions(s) / ED Diagnoses   Final diagnoses:  Fever in adult  Diarrhea, unspecified type  Symptomatic anemia    ED Discharge Orders    None       Lennice Sites, DO 11/19/18 1908    Lennice Sites, DO 12/02/18 [id number]

## 2018-11-19 NOTE — ED Notes (Signed)
Patient remains unable to provide urine sample at this time.

## 2018-11-19 NOTE — ED Notes (Signed)
ED TO INPATIENT HANDOFF REPORT  Name/Age/Gender Eric Hood 38 y.o. male  Code Status Code Status History    Date Active Date Inactive Code Status Order ID Comments User Context   05/15/2015 1417 05/20/2015 1744 Full Code [id number]  Rhodia Albright, NP ED   10/23/2014 1701 10/25/2014 2153 Full Code [id number]  Delfina Redwood, MD Inpatient      Home/SNF/Other Home  Chief Complaint Abnormal Labs  Level of Care/Admitting Diagnosis ED Disposition    ED Disposition Condition [facility] Area: [facility] [[id number]]  Level of Care: Telemetry [5]  Admit to tele based on following criteria: Other see comments  Comments: tachycardia  Diagnosis: Anemia [673419]  Admitting Physician: Shelbie Proctor [[id number]]  Attending Physician: Shelbie Proctor [[id number]]  PT Class (Do Not Modify): Observation [104]  PT Acc Code (Do Not Modify): Observation [10022]       Medical History Past Medical History:  Diagnosis Date  . Anemia 10/23/2014  . Anxiety   . Back pain   . Blood transfusion without reported diagnosis april 2016  . Esophageal stricture 03/2014  . GERD (gastroesophageal reflux disease)   . Hiatal hernia   . Reflux   . Substance abuse ([place])    on methadone  . Ulcerative esophagitis 03/2014   severe    Allergies Allergies  Allergen Reactions  . Hydrocodone Nausea And Vomiting    IV Location/Drains/Wounds Patient Lines/Drains/Airways Status   Active Line/Drains/Airways    Name:   Placement date:   Placement time:   Site:   Days:   Peripheral IV 11/19/18 Left Forearm   11/19/18    1533    Forearm   less than 1          Labs/Imaging Results for orders placed or performed during the hospital encounter of 11/19/18 (from the past 48 hour(s))  Comprehensive metabolic panel     Status: Abnormal   Collection Time: 11/19/18  3:36 PM  Result Value Ref Range   Sodium 138 135 - 145 mmol/L   Potassium 3.6 3.5 - 5.1 mmol/L    Chloride 104 98 - 111 mmol/L   CO2 26 22 - 32 mmol/L   Glucose, Bld 99 70 - 99 mg/dL   BUN 13 6 - 20 mg/dL   Creatinine, Ser 1.25 (H) 0.61 - 1.24 mg/dL   Calcium 8.7 (L) 8.9 - 10.3 mg/dL   Total Protein 7.3 6.5 - 8.1 g/dL   Albumin 3.7 3.5 - 5.0 g/dL   AST 21 15 - 41 U/L   ALT 18 0 - 44 U/L   Alkaline Phosphatase 97 38 - 126 U/L   Total Bilirubin 0.2 (L) 0.3 - 1.2 mg/dL   GFR calc non Af Amer >60 >60 mL/min   GFR calc Af Amer >60 >60 mL/min   Anion gap 8 5 - 15    Comment: Performed at [facility], [place] [address], [place], [place] [address]  CBC     Status: Abnormal   Collection Time: 11/19/18  3:36 PM  Result Value Ref Range   WBC 8.2 4.0 - 10.5 K/uL   RBC 4.32 4.22 - 5.81 MIL/uL   Hemoglobin 7.3 (L) 13.0 - 17.0 g/dL    Comment: Reticulocyte Hemoglobin testing may be clinically indicated, consider ordering this additional test [id number]    HCT 28.1 (L) 39.0 - 52.0 %   MCV 65.0 (L) 80.0 - 100.0 fL   MCH 16.9 (L) 26.0 - 34.0  pg   MCHC 26.0 (L) 30.0 - 36.0 g/dL   RDW 20.9 (H) 11.5 - 15.5 %   Platelets 351 150 - 400 K/uL   nRBC 0.0 0.0 - 0.2 %    Comment: Performed at [facility], [place] [address], [place], [place] [address]  Type and screen [place]     Status: None   Collection Time: 11/19/18  3:36 PM  Result Value Ref Range   ABO/RH(D) O POS    Antibody Screen NEG    Sample Expiration      11/22/2018 Performed at [facility], [place] [address] [place], [place] [address]   POC occult blood, ED     Status: None   Collection Time: 11/19/18  3:52 PM  Result Value Ref Range   Fecal Occult Bld NEGATIVE NEGATIVE  Influenza panel by PCR (type A & B)     Status: None   Collection Time: 11/19/18  4:05 PM  Result Value Ref Range   Influenza A By PCR NEGATIVE NEGATIVE   Influenza B By PCR NEGATIVE NEGATIVE    Comment: (NOTE) The Xpert Xpress Flu assay is intended as an aid in the diagnosis of   influenza and should not be used as a sole basis for treatment.  This  assay is FDA approved for nasopharyngeal swab specimens only. Nasal  washings and aspirates are unacceptable for Xpert Xpress Flu testing. Performed at [facility], [place] [address], [place], [place] [address]    Dg Chest 2 View  Result Date: 11/19/2018 CLINICAL DATA:  38 year old male with anemia.  Weakness.  Cough. EXAM: CHEST - 2 VIEW COMPARISON:  Chest radiographs 05/18/2015 and earlier. FINDINGS: Normal lung volumes and mediastinal contours. Visualized tracheal air column is within normal limits. Both lungs are clear. No pneumothorax or pleural effusion. No acute osseous abnormality identified. Negative visible bowel gas pattern. IMPRESSION: Negative.  No cardiopulmonary abnormality. Electronically Signed   By: Genevie Ann M.D.   On: 11/19/2018 16:38   None  Pending Labs Unresulted Labs (From admission, onward)    Start     Ordered   11/19/18 1831  Blood culture (routine x 2)  BLOOD CULTURE X 2,   STAT     11/19/18 1830   11/19/18 1831  Rapid urine drug screen (hospital performed)  ONCE - STAT,   R     11/19/18 1830   11/19/18 1546  Urinalysis, Routine w reflex microscopic  Once,   R     11/19/18 1545   11/19/18 1546  Urine culture  ONCE - STAT,   STAT     11/19/18 1545   Signed and Held  HIV antibody (Routine Testing)  Once,   R     Signed and Held   Signed and Held  CBC  Tomorrow morning,   R     Signed and Held          Vitals/Pain Today's Vitals   11/19/18 1810 11/19/18 1810 11/19/18 1830 11/19/18 1900  BP: 98/61  107/67 104/73  Pulse: 85  85 85  Resp: 15  12 14   Temp: 98.1 F (36.7 C)     TempSrc: Oral     SpO2: 95%  97% 97%  Weight:      Height:      PainSc:  0-No pain      Isolation Precautions Droplet precaution  Medications Medications  cloNIDine (CATAPRES) tablet 0.1 mg (has no administration in time  range)  pantoprazole (PROTONIX) injection 40 mg (has no  administration in time range)  lactated ringers bolus 1,000 mL ( Intravenous Stopped 11/19/18 1812)  acetaminophen (TYLENOL) tablet 1,000 mg (1,000 mg Oral Given 11/19/18 1603)  ondansetron (ZOFRAN) injection 4 mg (4 mg Intravenous Given 11/19/18 1603)    Mobility walks

## 2018-11-19 NOTE — Patient Instructions (Signed)

## 2018-11-19 NOTE — Progress Notes (Signed)
Subjective:     Patient ID: Eric Hood, male   DOB: 08-11-1981, 38 y.o.   MRN: [id number]  HPI Patient is seen for the following issues  His major complaint is difficulty sleeping.  He abruptly took himself off methadone about a week ago but states he was having insomnia issues well before then.  He discontinued after tapering down quickly from 105 mg to 60 mg.  He has surprisingly done fairly well with no major withdrawal type symptoms.  He denies any caffeine use.  No alcohol use.  No daytime naps.  Currently only getting about 2 hours sleep per night.  He has tried multiple things in the past including melatonin, Tylenol PM, Benadryl without relief.  He also tried trazodone without relief.  He states he had sleepwalking type issues with Ambien and Lunesta.  He is not a candidate for benzodiazepines because of his opioid abuse history.  He did use hydroxyzine once and thinks that may have helped  He has history of recurrent dysphagia and esophageal stricture secondary to GERD.  He has follow-up scheduled with GI this Friday.  He has had some recent lightheadedness and is requesting getting his hemoglobin checked again.  No obvious GI bleeds.  No melena.  No hematemesis.  Appetite and weight are stable  Past Medical History:  Diagnosis Date  . Anemia 10/23/2014  . Anxiety   . Back pain   . Blood transfusion without reported diagnosis april 2016  . Esophageal stricture 03/2014  . GERD (gastroesophageal reflux disease)   . Hiatal hernia   . Reflux   . Substance abuse ([place])    on methadone  . Ulcerative esophagitis 03/2014   severe   Past Surgical History:  Procedure Laterality Date  . BALLOON DILATION N/A 05/20/2015   Procedure: BALLOON DILATION;  Surgeon: Milus Banister, MD;  Location: [facility];  Service: Endoscopy;  Laterality: N/A;  . COLONOSCOPY    . ESOPHAGEAL DILATION     On 3 occasions: 03/2014, 08/2014, 09/2014  . ESOPHAGOGASTRODUODENOSCOPY N/A 05/20/2015   Procedure:  ESOPHAGOGASTRODUODENOSCOPY (EGD);  Surgeon: Milus Banister, MD;  Location: [facility];  Service: Endoscopy;  Laterality: N/A;  . ESOPHAGOGASTRODUODENOSCOPY (EGD) WITH PROPOFOL N/A 01/18/2015   Procedure: ESOPHAGOGASTRODUODENOSCOPY (EGD) WITH PROPOFOL;  Surgeon: Lafayette Dragon, MD;  [facility];  Service: Endoscopy;  Laterality: N/A;  . ESOPHAGOGASTRODUODENOSCOPY (EGD) WITH PROPOFOL N/A 09/04/2017   Procedure: ESOPHAGOGASTRODUODENOSCOPY (EGD) WITH PROPOFOL;  Surgeon: Ladene Artist, MD;  [facility];  Service: Endoscopy;  Laterality: N/A;  need fluro  . ESOPHAGOGASTRODUODENOSCOPY (EGD) WITH PROPOFOL N/A 10/01/2017   Procedure: ESOPHAGOGASTRODUODENOSCOPY (EGD) WITH PROPOFOL;  Surgeon: Ladene Artist, MD;  [facility];  Service: Endoscopy;  Laterality: N/A;  . HAND SURGERY Right   . SAVORY DILATION N/A 09/04/2017   Procedure: SAVORY DILATION;  Surgeon: Ladene Artist, MD;  [facility];  Service: Endoscopy;  Laterality: N/A;  . SAVORY DILATION N/A 10/01/2017   Procedure: SAVORY DILATION;  Surgeon: Ladene Artist, MD;  [facility];  Service: Endoscopy;  Laterality: N/A;  . UPPER GASTROINTESTINAL ENDOSCOPY      reports that he has never smoked. He has never used smokeless tobacco. He reports current alcohol use. He reports previous drug use. family history includes Hypertension in his father and mother. Allergies  Allergen Reactions  . Hydrocodone Nausea And Vomiting     Review of Systems  Constitutional: Negative for appetite change, fever and unexpected weight change.  HENT:  Positive for trouble swallowing.   Respiratory: Negative for shortness of breath.   Cardiovascular: Negative for chest pain.  Gastrointestinal: Negative for abdominal pain and blood in stool.  Neurological: Positive for dizziness and light-headedness. Negative for syncope.  Psychiatric/Behavioral: Positive for sleep disturbance. Negative for agitation,  confusion and suicidal ideas.       Objective:   Physical Exam Constitutional:      Appearance: Normal appearance.  Cardiovascular:     Comments: Regular rhythm but rate is up around 100. Musculoskeletal:     Right lower leg: No edema.     Left lower leg: No edema.  Neurological:     General: No focal deficit present.     Mental Status: He is alert and oriented to person, place, and time. Mental status is at baseline.     Cranial Nerves: No cranial nerve deficit.  Psychiatric:        Mood and Affect: Mood normal.        Behavior: Behavior normal.        Thought Content: Thought content normal.        Assessment:     #1 chronic insomnia.  Probably exacerbated by abrupt discontinuation of opioids as above.  He has tried multiple medications without relief as above.  He is not a good candidate for benzos because of his opioid history  #2 history of recurrent dysphagia esophageal stricture secondary to GERD  #3 history of anemia    Plan:     -We have agreed to trial of hydroxyzine 25 mg 1-2 nightly as needed.  Avoidance of benzos as above -Handout on sleep hygiene given -Continue close follow-up with GI as scheduled -Check CBC  Eulas Post MD [facility]

## 2018-11-19 NOTE — ED Notes (Signed)
Pt aware urine sample is needed 

## 2018-11-19 NOTE — ED Notes (Signed)
Bed: AS34 Expected date:  Expected time:  Means of arrival:  Comments: Triage

## 2018-11-19 NOTE — ED Triage Notes (Signed)
Patient sent from PCP hemoglobin 7.8. Reports weakness x2 days. Patient is pale. Hx GI bleeding. Denies dark stools/rectal bleeding.

## 2018-11-19 NOTE — H&P (Signed)
H&P        History and Physical    Eric Hood BOF:[id number] DOB: 1981-08-14 DOA: 11/19/2018  PCP: Eric Post, MD   Patient coming from: Home  I have personally briefly reviewed patient's old medical records in [place]  Chief Complaint: Fatigue, anemia  HPI: Eric Hood is a 38 y.o. male with medical history significant of dysphagia, chronic anemia, chronic methadose use presents with worsening anemia.  Been feeling more fatigued over the past week or so.  He has chronic anemia and actually called his GI doctor and has a follow-up on this Friday.  Blood work done with PCP showed hemoglobin of 7.5.  Repeat here was 7.3.  He has chronic anemia baseline appears to be an 8 .  Has history of dysphasia status Hood esophageal dilatation last September.  Has been having increasing dysphasia symptoms.  He denies any nausea vomiting but has had some loose stools for the past 2 weeks.  Patient was on chronic methadone decided to stop it about a week ago on his own.  He denies any chest pain or shortness of breath.  He has had a cough.  Patient had a fever here of 101.  His chest x-ray was negative  ED Course: ED provider spoke to GI [facility] who recommended patient follow-up in office on Friday as scheduled.  Patient received Tylenol for his fever in the ED.  He also received a dose of clonidine as he thought maybe 7 symptoms were related to withdrawal.  He was also given a dose of Protonix.  Review of Systems: Positive for fatigue, loose stools, cough denies chest pain shortness of breath abdominal pain all others reviewed with patient  and are  negative unless otherwise stated  Past Medical History:  Diagnosis Date  . Anemia 10/23/2014  . Anxiety   . Back pain   . Blood transfusion without reported diagnosis april 2016  . Esophageal stricture 03/2014  . GERD (gastroesophageal reflux disease)   . Hiatal hernia   . Reflux   . Substance abuse ([place])    on methadone  .  Ulcerative esophagitis 03/2014   severe    Past Surgical History:  Procedure Laterality Date  . BALLOON DILATION N/A 05/20/2015   Procedure: BALLOON DILATION;  Surgeon: Milus Banister, MD;  Location: [facility];  Service: Endoscopy;  Laterality: N/A;  . COLONOSCOPY    . ESOPHAGEAL DILATION     On 3 occasions: 03/2014, 08/2014, 09/2014  . ESOPHAGOGASTRODUODENOSCOPY N/A 05/20/2015   Procedure: ESOPHAGOGASTRODUODENOSCOPY (EGD);  Surgeon: Milus Banister, MD;  Location: [facility];  Service: Endoscopy;  Laterality: N/A;  . ESOPHAGOGASTRODUODENOSCOPY (EGD) WITH PROPOFOL N/A 01/18/2015   Procedure: ESOPHAGOGASTRODUODENOSCOPY (EGD) WITH PROPOFOL;  Surgeon: Lafayette Dragon, MD;  [facility];  Service: Endoscopy;  Laterality: N/A;  . ESOPHAGOGASTRODUODENOSCOPY (EGD) WITH PROPOFOL N/A 09/04/2017   Procedure: ESOPHAGOGASTRODUODENOSCOPY (EGD) WITH PROPOFOL;  Surgeon: Ladene Artist, MD;  [facility];  Service: Endoscopy;  Laterality: N/A;  need fluro  . ESOPHAGOGASTRODUODENOSCOPY (EGD) WITH PROPOFOL N/A 10/01/2017   Procedure: ESOPHAGOGASTRODUODENOSCOPY (EGD) WITH PROPOFOL;  Surgeon: Ladene Artist, MD;  [facility];  Service: Endoscopy;  Laterality: N/A;  . HAND SURGERY Right   . SAVORY DILATION N/A 09/04/2017   Procedure: SAVORY DILATION;  Surgeon: Ladene Artist, MD;  [facility];  Service: Endoscopy;  Laterality: N/A;  . SAVORY DILATION N/A 10/01/2017   Procedure: SAVORY DILATION;  Surgeon: Ladene Artist, MD;  [facility];  Service: Endoscopy;  Laterality: N/A;  . UPPER GASTROINTESTINAL ENDOSCOPY       reports that he has never smoked. He has never used smokeless tobacco. He reports current alcohol use. He reports previous drug use.  Allergies  Allergen Reactions  . Hydrocodone Nausea And Vomiting    Family History  Problem Relation Age of Onset  . Hypertension Mother   . Hypertension Father   . Colon cancer Neg Hx   .  Esophageal cancer Neg Hx   . Rectal cancer Neg Hx   . Stomach cancer Neg Hx   . Colon polyps Neg Hx   . Liver cancer Neg Hx   . Pancreatic cancer Neg Hx      Prior to Admission medications   Medication Sig Start Date End Date Taking? Authorizing Provider  methadone (DOLOPHINE) 10 MG/ML solution Take 70 mg by mouth daily.    Yes [provider]  pantoprazole (PROTONIX) 40 MG tablet Take 1 tablet (40 mg total) by mouth 2 (two) times daily before a meal. Patient taking differently: Take 40 mg by mouth daily as needed (reflux).  03/05/18  Yes Ladene Artist, MD  promethazine (PHENERGAN) 25 MG tablet Take 25 mg by mouth every 6 (six) hours as needed for nausea or vomiting.   Yes [provider]  ranitidine (ZANTAC) 300 MG tablet Take 1 tablet (300 mg total) by mouth at bedtime. 03/05/18  Yes Ladene Artist, MD  Tetrahydrozoline HCl (VISINE OP) Apply 1 drop to eye daily as needed (dry eyes).   Yes [provider]  hydrOXYzine (ATARAX/VISTARIL) 25 MG tablet Take one to two tablets qhs prn insomnia. 11/19/18   Eric Post, MD    Physical Exam: Vitals:   11/19/18 1830 11/19/18 1900 11/19/18 1930 11/19/18 2015  BP: 107/67 104/73 102/71 107/67  Pulse: 85 85 81 75  Resp: 12 14 10 12   Temp:    98 F (36.7 C)  TempSrc:    Oral  SpO2: 97% 97% 94% 94%  Weight:    81.8 kg  Height:    6' (1.829 m)    Constitutional: NAD, calm, comfortable Vitals:   11/19/18 1830 11/19/18 1900 11/19/18 1930 11/19/18 2015  BP: 107/67 104/73 102/71 107/67  Pulse: 85 85 81 75  Resp: 12 14 10 12   Temp:    98 F (36.7 C)  TempSrc:    Oral  SpO2: 97% 97% 94% 94%  Weight:    81.8 kg  Height:    6' (1.829 m)   Eyes: PERRL, pale conjunctiva ENMT: Mucous membranes are moist. Posterior pharynx clear of any exudate or lesions.  Neck: normal, supple, no masses,  Respiratory: clear to auscultation bilaterally, no wheezing, no crackles. Normal respiratory effort. No accessory muscle  use.  Cardiovascular: Regular rate and rhythm, no murmurs / rubs / gallops. No extremity edema. 2+ pedal pulses.   Abdomen: no tenderness, no masses palpated.. Bowel sounds positive.  Musculoskeletal: no clubbing / cyanosis. No joint deformity upper and lower extremities.  Skin: no rashes, lesions, ulcers. No induration Neurologic: CN 2-12 grossly intact. . Strength 5/5 in all 4.  Psychiatric: t. Alert and oriented x 3.  Flat affect      Labs on Admission: I have personally reviewed following labs and imaging studies  CBC: Recent Labs  Lab 11/19/18 0921 11/19/18 1536  WBC 8.5 8.2  NEUTROABS 6.3  --   HGB 7.8 Repeated and verified X2.* 7.3*  HCT 26.6  Repeated and verified X2.* 28.1*  MCV 57.8 Repeated and verified X2.* 65.0*  PLT 348.0 638   Basic Metabolic Panel: Recent Labs  Lab 11/19/18 1536  NA 138  K 3.6  CL 104  CO2 26  GLUCOSE 99  BUN 13  CREATININE 1.25*  CALCIUM 8.7*   GFR: Estimated Creatinine Clearance: 89.7 mL/min (A) (by C-G formula based on SCr of 1.25 mg/dL (H)). Liver Function Tests: Recent Labs  Lab 11/19/18 1536  AST 21  ALT 18  ALKPHOS 97  BILITOT 0.2*  PROT 7.3  ALBUMIN 3.7   No results for input(s): LIPASE, AMYLASE in the last 168 hours. No results for input(s): AMMONIA in the last 168 hours. Coagulation Profile: No results for input(s): INR, PROTIME in the last 168 hours. Cardiac Enzymes: No results for input(s): CKTOTAL, CKMB, CKMBINDEX, TROPONINI in the last 168 hours. BNP (last 3 results) No results for input(s): PROBNP in the last 8760 hours. HbA1C: No results for input(s): HGBA1C in the last 72 hours. CBG: No results for input(s): GLUCAP in the last 168 hours. Lipid Profile: No results for input(s): CHOL, HDL, LDLCALC, TRIG, CHOLHDL, LDLDIRECT in the last 72 hours. Thyroid Function Tests: No results for input(s): TSH, T4TOTAL, FREET4, T3FREE, THYROIDAB in the last 72 hours. Anemia Panel: No results for input(s): VITAMINB12,  FOLATE, FERRITIN, TIBC, IRON, RETICCTPCT in the last 72 hours. Urine analysis:    Component Value Date/Time   COLORURINE YELLOW 09/16/2015 1825   APPEARANCEUR CLOUDY (A) 09/16/2015 1825   LABSPEC 1.031 (H) 09/16/2015 1825   PHURINE 6.0 09/16/2015 1825   GLUCOSEU NEGATIVE 09/16/2015 1825   HGBUR NEGATIVE 09/16/2015 1825   BILIRUBINUR NEGATIVE 09/16/2015 1825   KETONESUR 15 (A) 09/16/2015 1825   PROTEINUR NEGATIVE 09/16/2015 1825   UROBILINOGEN 1.0 02/10/2015 1350   NITRITE NEGATIVE 09/16/2015 1825   LEUKOCYTESUR NEGATIVE 09/16/2015 1825    Radiological Exams on Admission: Dg Chest 2 View  Result Date: 11/19/2018 CLINICAL DATA:  38 year old male with anemia.  Weakness.  Cough. EXAM: CHEST - 2 VIEW COMPARISON:  Chest radiographs 05/18/2015 and earlier. FINDINGS: Normal lung volumes and mediastinal contours. Visualized tracheal air column is within normal limits. Both lungs are clear. No pneumothorax or pleural effusion. No acute osseous abnormality identified. Negative visible bowel gas pattern. IMPRESSION: Negative.  No cardiopulmonary abnormality. Electronically Signed   By: Genevie Ann M.D.   On: 11/19/2018 16:38    Assessment/Plan Principal Problem:   Acute on chronic anemia Active Problems:   History of esophagitis and stricture   Methadone dependence (HCC)   Fever  Elevated Creatinine  -transfuse 1 unit prbcs, h/h in am. Cont PPI, f/u with GI Friday as scheduled. Heme neg stool in ED .  Hemoglobin 7.3 today baseline around 8 -NO obvious source of infection. ? Viral. supportive care. Check blood CX -He now wants to restart methadone. Will give dose tonight, f/u with pain management outpatient. - elevated crt, nml BUN. Check UA, received 1 liter LR in ED   DVT prophylaxis: SCD Code Status:FULL  Disposition Plan: home tomorrow   Consults called: Ed called Dr Fuller Plan ( [facility])  Admission status: Obs tele    Kharisma Glasner Johnson-Pitts MD [facility] Pager [phone]  If 7PM-7AM, please contact night-coverage [email or web address] Password [facility]  11/19/2018, 9:31 PM

## 2018-11-20 ENCOUNTER — Telehealth: Payer: Self-pay

## 2018-11-20 ENCOUNTER — Observation Stay (HOSPITAL_COMMUNITY): Payer: Self-pay

## 2018-11-20 ENCOUNTER — Other Ambulatory Visit: Payer: Self-pay

## 2018-11-20 DIAGNOSIS — D509 Iron deficiency anemia, unspecified: Secondary | ICD-10-CM

## 2018-11-20 LAB — CBC WITH DIFFERENTIAL/PLATELET
Absolute Monocytes: 493 cells/uL (ref 200–950)
BASOS ABS: 26 {cells}/uL (ref 0–200)
Basophils Relative: 0.3 %
EOS PCT: 4.3 %
Eosinophils Absolute: 378 cells/uL (ref 15–500)
HCT: 29.7 % — ABNORMAL LOW (ref 38.5–50.0)
Hemoglobin: 7.8 g/dL — ABNORMAL LOW (ref 13.2–17.1)
Lymphs Abs: 1082 cells/uL (ref 850–3900)
MCH: 16.7 pg — AB (ref 27.0–33.0)
MCHC: 26.3 g/dL — AB (ref 32.0–36.0)
MCV: 63.7 fL — ABNORMAL LOW (ref 80.0–100.0)
MPV: 9.5 fL (ref 7.5–12.5)
Monocytes Relative: 5.6 %
Neutro Abs: 6820 cells/uL (ref 1500–7800)
Neutrophils Relative %: 77.5 %
Platelets: 262 10*3/uL (ref 140–400)
RBC: 4.66 10*6/uL (ref 4.20–5.80)
RDW: 19.2 % — ABNORMAL HIGH (ref 11.0–15.0)
Total Lymphocyte: 12.3 %
WBC: 8.8 10*3/uL (ref 3.8–10.8)

## 2018-11-20 LAB — RAPID URINE DRUG SCREEN, HOSP PERFORMED
Amphetamines: NOT DETECTED
Barbiturates: NOT DETECTED
Benzodiazepines: POSITIVE — AB
Cocaine: NOT DETECTED
Opiates: NOT DETECTED
Tetrahydrocannabinol: NOT DETECTED

## 2018-11-20 LAB — BASIC METABOLIC PANEL
Anion gap: 5 (ref 5–15)
BUN: 14 mg/dL (ref 6–20)
CO2: 26 mmol/L (ref 22–32)
Calcium: 8.2 mg/dL — ABNORMAL LOW (ref 8.9–10.3)
Chloride: 108 mmol/L (ref 98–111)
Creatinine, Ser: 1.22 mg/dL (ref 0.61–1.24)
GFR calc Af Amer: >60 mL/min (ref >60)
GFR calc non Af Amer: >60 mL/min (ref >60)
Glucose, Bld: 102 mg/dL — ABNORMAL HIGH (ref 70–99)
Potassium: 4.1 mmol/L (ref 3.5–5.1)
Sodium: 139 mmol/L (ref 135–145)

## 2018-11-20 LAB — IRON AND TIBC
Iron: 14 ug/dL — ABNORMAL LOW (ref 45–182)
Saturation Ratios: 4 % — ABNORMAL LOW (ref 17.9–39.5)
TIBC: 363 ug/dL (ref 250–450)
UIBC: 349 ug/dL

## 2018-11-20 LAB — URINALYSIS, ROUTINE W REFLEX MICROSCOPIC
Bilirubin Urine: NEGATIVE
Glucose, UA: NEGATIVE mg/dL
Hgb urine dipstick: NEGATIVE
Ketones, ur: NEGATIVE mg/dL
LEUKOCYTES UA: NEGATIVE
Nitrite: NEGATIVE
Protein, ur: NEGATIVE mg/dL
SPECIFIC GRAVITY, URINE: 1.017 (ref 1.005–1.030)
pH: 5 (ref 5.0–8.0)

## 2018-11-20 LAB — CBC
HCT: 28.1 % — ABNORMAL LOW (ref 39.0–52.0)
Hemoglobin: 7.4 g/dL — ABNORMAL LOW (ref 13.0–17.0)
MCH: 18 pg — ABNORMAL LOW (ref 26.0–34.0)
MCHC: 26.3 g/dL — AB (ref 30.0–36.0)
MCV: 68.2 fL — ABNORMAL LOW (ref 80.0–100.0)
Platelets: 291 10*3/uL (ref 150–400)
RBC: 4.12 MIL/uL — ABNORMAL LOW (ref 4.22–5.81)
RDW: 23.3 % — ABNORMAL HIGH (ref 11.5–15.5)
WBC: 5.8 10*3/uL (ref 4.0–10.5)
nRBC: 0 % (ref 0.0–0.2)

## 2018-11-20 LAB — HIV ANTIBODY (ROUTINE TESTING W REFLEX): HIV Screen 4th Generation wRfx: NONREACTIVE

## 2018-11-20 LAB — HEMOGLOBIN AND HEMATOCRIT, BLOOD
HEMATOCRIT: 28.1 % — AB (ref 39.0–52.0)
Hemoglobin: 7.5 g/dL — ABNORMAL LOW (ref 13.0–17.0)

## 2018-11-20 LAB — FERRITIN: Ferritin: 3 ng/mL — ABNORMAL LOW (ref 24–336)

## 2018-11-20 MED ORDER — IPRATROPIUM-ALBUTEROL 0.5-2.5 (3) MG/3ML IN SOLN
3.0000 mL | Freq: Two times a day (BID) | RESPIRATORY_TRACT | Status: DC
  Administered 2018-11-20 – 2018-11-21 (×2): 3 mL via RESPIRATORY_TRACT
  Filled 2018-11-20 (×2): qty 3

## 2018-11-20 MED ORDER — SODIUM CHLORIDE 0.9 % IV SOLN
510.0000 mg | INTRAVENOUS | Status: DC
  Administered 2018-11-20: 510 mg via INTRAVENOUS
  Filled 2018-11-20: qty 17

## 2018-11-20 MED ORDER — IPRATROPIUM-ALBUTEROL 0.5-2.5 (3) MG/3ML IN SOLN
3.0000 mL | Freq: Four times a day (QID) | RESPIRATORY_TRACT | Status: DC
  Administered 2018-11-20: 3 mL via RESPIRATORY_TRACT
  Filled 2018-11-20: qty 3

## 2018-11-20 MED ORDER — METHADONE HCL 10 MG PO TABS
70.0000 mg | ORAL_TABLET | Freq: Once | ORAL | Status: AC
  Administered 2018-11-20: 70 mg via ORAL
  Filled 2018-11-20: qty 7

## 2018-11-20 MED ORDER — FERROUS SULFATE 325 (65 FE) MG PO TABS
325.0000 mg | ORAL_TABLET | Freq: Two times a day (BID) | ORAL | Status: DC
  Administered 2018-11-20 – 2018-11-21 (×3): 325 mg via ORAL
  Filled 2018-11-20 (×3): qty 1

## 2018-11-20 NOTE — Telephone Encounter (Signed)
Eric Hood has been scheduled at the [facility] for 11/26/18 and 12/03/18 both at 10:00 am  Left message for patient to call back

## 2018-11-20 NOTE — Progress Notes (Signed)
Patient had signed and held order for iron infusion dated for today. On call GI Dr. Silverio Decamp returned page and stated to contact hospitalist or GI in the AM. Dr. Nevada Crane paged and instructed this RN to give the ordered iron infusion. Vital signs obtained prior to and after infusion and stable. Patient tolerated infusion well. Will continue with plan of care.

## 2018-11-20 NOTE — Telephone Encounter (Signed)
-----   Message from Ladene Artist, MD sent at 11/20/2018  2:58 PM EST ----- Worsening iron deficiency anemia in ED last night. He cannot tolerate Fe PO due to erosive esophagitis, esophageal stricture. Please arrange Feraheme IV x 2 performed 3 -8 days apart. He has an office appt with me on Friday however wanted to get this set up ASAP as he need ED evaluation yesterday.

## 2018-11-20 NOTE — Progress Notes (Signed)
Patient inquired about dose of methadone. Provider paged.

## 2018-11-20 NOTE — Progress Notes (Signed)
PROGRESS NOTE  Eric Hood WER:[id number] DOB: 06-19-1981 DOA: 11/19/2018 PCP: Eulas Post, MD  HPI/Recap of past 24 hours: Eric Hood is a 38 y.o. male with medical history significant of dysphagia, chronic anemia, chronic methadose use presents with worsening anemia.  Been feeling more fatigued over the past week or so.  He has chronic anemia and actually called his GI doctor and has a follow-up on this Friday.  Blood work done with PCP showed hemoglobin of 7.5.  Repeat here was 7.3.  He has chronic anemia baseline appears to be an 8 .  Has history of dysphasia status post esophageal dilatation last September.  Has been having increasing dysphasia symptoms.  He denies any nausea vomiting but has had some loose stools for the past 2 weeks.  Patient was on chronic methadone decided to stop it about a week ago on his own.  He denies any chest pain or shortness of breath.  He has had a cough.  Patient had a fever here of 101.  His chest x-ray was negative  ED Course: ED provider spoke to GI [facility] who recommended patient follow-up in office on Friday as scheduled.  Patient received Tylenol for his fever in the ED.  He also received a dose of clonidine as he thought maybe 7 symptoms were related to withdrawal.  He was also given a dose of Protonix.  11/20/2018: Patient seen and examined at bedside.  Reports wheezing post blood transfusion.  Chest x-ray unrevealing.  Hold off IV fluid.   Assessment/Plan: Principal Problem:   Acute on chronic anemia Active Problems:   History of esophagitis and stricture   Methadone dependence (HCC)   Fever  Symptomatic anemia/severe iron deficiency anemia Presented with hemoglobin 7.3 Transfused 1 unit of PRBC Hemoglobin increased to 7.4 with MCV 68 Repeat H&H Negative FOBT Patient has an appointment with GI on Friday, keep appointment No sign of overt bleeding Iron studies significant for severe iron deficiency Started on ferrous sulfate  325 mg twice daily Will obtain IV infusion outpatient Continue to monitor repeat CBC in the morning  Acute hypoxic respiratory failure post blood transfusion Currently on 3 L of oxygen by nasal cannula and saturating 96% Wean off O2 supplementation Start scheduled nebs Reviewed chest x-ray done on 11/20/2018 which revealed no lobular infiltrates or increase in pulmonary vascularity Home O2 evaluation prior to discharge  History of chronic pain syndrome on methadone Wants to restart methadone which he had stopped Resume dose of methadone Plan to discharge tomorrow to resume his methadone clinic appointments  AKI Baseline creatinine appears to be 0.9 Presented with creatinine of 1.25 Creatinine is improving to 1.22 Avoid nephrotoxic agents/dehydration/hypotension Repeat CBC in the morning Monitor urine output  DVT prophylaxis: SCDs Code Status:FULL  Disposition Plan: home possibly tomorrow 11/21/2018 Consults called: Ed called Dr Fuller Plan ( [facility])      Objective: Vitals:   11/20/18 0128 11/20/18 0412 11/20/18 1345 11/20/18 1352  BP: 99/63 (!) 113/96 109/70   Pulse: 84 80 83   Resp: 16 18 20    Temp: 98.1 F (36.7 C) 98 F (36.7 C) 98.1 F (36.7 C)   TempSrc: Oral Oral    SpO2: 92% 97% 97% 96%  Weight:      Height:        Intake/Output Summary (Last 24 hours) at 11/20/2018 1840 Last data filed at 11/20/2018 1226 Gross per 24 hour  Intake 362 ml  Output 275 ml  Net 87 ml  Filed Weights   11/19/18 1612 11/19/18 2015  Weight: 83.9 kg 81.8 kg    Exam:  . General: 38 y.o. year-old male well developed well nourished in no acute distress.  Alert and oriented x3. . Cardiovascular: Regular rate and rhythm with no rubs or gallops.  No thyromegaly or JVD noted.   Marland Kitchen Respiratory: Audible wheezes noted bilaterally and diffusely. Good inspiratory effort. . Abdomen: Soft nontender nondistended with normal bowel sounds x4 quadrants. . Musculoskeletal: No lower extremity  edema. 2/4 pulses in all 4 extremities. Marland Kitchen Psychiatry: Mood is appropriate for condition and setting   Data Reviewed: CBC: Recent Labs  Lab 11/19/18 0921 11/19/18 1231 11/19/18 1536 11/20/18 0408  WBC 8.5 8.8 8.2 5.8  NEUTROABS 6.3 6,820  --   --   HGB 7.8 Repeated and verified X2.* 7.8* 7.3* 7.4*  HCT 26.6 Repeated and verified X2.* 29.7* 28.1* 28.1*  MCV 57.8 Repeated and verified X2.* 63.7* 65.0* 68.2*  PLT 348.0 262 351 657   Basic Metabolic Panel: Recent Labs  Lab 11/19/18 1536 11/20/18 0408  NA 138 139  K 3.6 4.1  CL 104 108  CO2 26 26  GLUCOSE 99 102*  BUN 13 14  CREATININE 1.25* 1.22  CALCIUM 8.7* 8.2*   GFR: Estimated Creatinine Clearance: 91.9 mL/min (by C-G formula based on SCr of 1.22 mg/dL). Liver Function Tests: Recent Labs  Lab 11/19/18 1536  AST 21  ALT 18  ALKPHOS 97  BILITOT 0.2*  PROT 7.3  ALBUMIN 3.7   No results for input(s): LIPASE, AMYLASE in the last 168 hours. No results for input(s): AMMONIA in the last 168 hours. Coagulation Profile: No results for input(s): INR, PROTIME in the last 168 hours. Cardiac Enzymes: No results for input(s): CKTOTAL, CKMB, CKMBINDEX, TROPONINI in the last 168 hours. BNP (last 3 results) No results for input(s): PROBNP in the last 8760 hours. HbA1C: No results for input(s): HGBA1C in the last 72 hours. CBG: No results for input(s): GLUCAP in the last 168 hours. Lipid Profile: No results for input(s): CHOL, HDL, LDLCALC, TRIG, CHOLHDL, LDLDIRECT in the last 72 hours. Thyroid Function Tests: No results for input(s): TSH, T4TOTAL, FREET4, T3FREE, THYROIDAB in the last 72 hours. Anemia Panel: Recent Labs    11/20/18 0844  FERRITIN 3*  TIBC 363  IRON 14*   Urine analysis:    Component Value Date/Time   COLORURINE YELLOW 09/16/2015 1825   APPEARANCEUR CLOUDY (A) 09/16/2015 1825   LABSPEC 1.031 (H) 09/16/2015 1825   PHURINE 6.0 09/16/2015 1825   GLUCOSEU NEGATIVE 09/16/2015 1825   HGBUR NEGATIVE  09/16/2015 1825   BILIRUBINUR NEGATIVE 09/16/2015 1825   KETONESUR 15 (A) 09/16/2015 1825   PROTEINUR NEGATIVE 09/16/2015 1825   UROBILINOGEN 1.0 02/10/2015 1350   NITRITE NEGATIVE 09/16/2015 1825   LEUKOCYTESUR NEGATIVE 09/16/2015 1825   Sepsis Labs: @LABRCNTIP (procalcitonin:4,lacticidven:4)  ) Recent Results (from the past 240 hour(s))  Blood culture (routine x 2)     Status: None (Preliminary result)   Collection Time: 11/19/18  3:35 PM  Result Value Ref Range Status   Specimen Description   Final    BLOOD BLOOD LEFT FOREARM Performed at [facility], [place] [address] [place], [place] [address]    Special Requests   Final    BOTTLES DRAWN AEROBIC ONLY Blood Culture adequate volume Performed at [facility] [address] [place], [place] [address]    Culture   Final    NO GROWTH < 24 HOURS  Performed at [facility], [place] [address] [place], [place] [address]    Report Status PENDING  Incomplete  Blood culture (routine x 2)     Status: None (Preliminary result)   Collection Time: 11/19/18  8:46 PM  Result Value Ref Range Status   Specimen Description   Final    BLOOD LEFT ARM Performed at [facility] [address], [place], [place] [address]    Special Requests   Final    BOTTLES DRAWN AEROBIC AND ANAEROBIC Blood Culture results may not be optimal due to an inadequate volume of blood received in culture bottles Performed at [facility] [address] [place], [place] [address]    Culture   Final    NO GROWTH < 24 HOURS Performed at [facility] [address], [place], [place] [address]    Report Status PENDING  Incomplete      Studies: Dg Chest Port 1 View  Result Date: 11/20/2018 CLINICAL DATA:  Wheezing EXAM: PORTABLE CHEST 1 VIEW COMPARISON:  Yesterday FINDINGS: Normal heart size and mediastinal contours. There is no edema, consolidation, effusion, or pneumothorax.  No osseous findings. IMPRESSION: Negative portable chest. Electronically Signed   By: Monte Fantasia M.D.   On: 11/20/2018 09:47    Scheduled Meds: . cloNIDine  0.1 mg Oral Once  . famotidine  40 mg Oral QHS  . ferrous sulfate  325 mg Oral BID WC  . hydrOXYzine  25 mg Oral QHS  . ipratropium-albuterol  3 mL Nebulization BID  . pantoprazole  40 mg Oral BID AC  . pantoprazole (PROTONIX) IV  40 mg Intravenous Once    Continuous Infusions:   LOS: 0 days     Kayleen Memos, MD [facility] Pager [phone]  If 7PM-7AM, please contact night-coverage [email or web address] Password [facility] 11/20/2018, 6:40 PM

## 2018-11-20 NOTE — Progress Notes (Signed)
Patient appeared to be SOB this am. Pt reports feeling SOB. Sat 90% on ra. 2lt O2 applied sat increased 93%, then to 95-96% 2.5 lt. Will continue to monitor.

## 2018-11-21 LAB — CBC WITH DIFFERENTIAL/PLATELET
Abs Immature Granulocytes: 0.02 10*3/uL (ref 0.00–0.07)
Basophils Absolute: 0 10*3/uL (ref 0.0–0.1)
Basophils Relative: 0 %
Eosinophils Absolute: 0.8 10*3/uL — ABNORMAL HIGH (ref 0.0–0.5)
Eosinophils Relative: 12 %
HCT: 28.4 % — ABNORMAL LOW (ref 39.0–52.0)
Hemoglobin: 7.5 g/dL — ABNORMAL LOW (ref 13.0–17.0)
Immature Granulocytes: 0 %
LYMPHS ABS: 1.7 10*3/uL (ref 0.7–4.0)
Lymphocytes Relative: 27 %
MCH: 17.9 pg — ABNORMAL LOW (ref 26.0–34.0)
MCHC: 26.4 g/dL — ABNORMAL LOW (ref 30.0–36.0)
MCV: 67.6 fL — ABNORMAL LOW (ref 80.0–100.0)
Monocytes Absolute: 0.4 10*3/uL (ref 0.1–1.0)
Monocytes Relative: 7 %
Neutro Abs: 3.2 10*3/uL (ref 1.7–7.7)
Neutrophils Relative %: 54 %
Platelets: 282 10*3/uL (ref 150–400)
RBC: 4.2 MIL/uL — ABNORMAL LOW (ref 4.22–5.81)
RDW: 23.2 % — ABNORMAL HIGH (ref 11.5–15.5)
WBC: 6.1 10*3/uL (ref 4.0–10.5)
nRBC: 0 % (ref 0.0–0.2)

## 2018-11-21 LAB — COMPREHENSIVE METABOLIC PANEL
ALBUMIN: 3.2 g/dL — AB (ref 3.5–5.0)
ALT: 16 U/L (ref 0–44)
AST: 20 U/L (ref 15–41)
Alkaline Phosphatase: 77 U/L (ref 38–126)
Anion gap: 5 (ref 5–15)
BILIRUBIN TOTAL: 0.2 mg/dL — AB (ref 0.3–1.2)
BUN: 7 mg/dL (ref 6–20)
CO2: 28 mmol/L (ref 22–32)
Calcium: 8.1 mg/dL — ABNORMAL LOW (ref 8.9–10.3)
Chloride: 105 mmol/L (ref 98–111)
Creatinine, Ser: 1.13 mg/dL (ref 0.61–1.24)
GFR calc Af Amer: >60 mL/min (ref >60)
GFR calc non Af Amer: >60 mL/min (ref >60)
GLUCOSE: 115 mg/dL — AB (ref 70–99)
Potassium: 3.9 mmol/L (ref 3.5–5.1)
Sodium: 138 mmol/L (ref 135–145)
Total Protein: 6 g/dL — ABNORMAL LOW (ref 6.5–8.1)

## 2018-11-21 LAB — URINE CULTURE: Culture: NO GROWTH

## 2018-11-21 LAB — PREPARE RBC (CROSSMATCH)

## 2018-11-21 MED ORDER — METHADONE HCL 10 MG PO TABS
70.0000 mg | ORAL_TABLET | Freq: Once | ORAL | Status: AC
  Administered 2018-11-21: 70 mg via ORAL
  Filled 2018-11-21: qty 7

## 2018-11-21 MED ORDER — SODIUM CHLORIDE 0.9% IV SOLUTION
Freq: Once | INTRAVENOUS | Status: AC
  Administered 2018-11-21: 14:00:00 via INTRAVENOUS

## 2018-11-21 MED ORDER — ALUM & MAG HYDROXIDE-SIMETH 200-200-20 MG/5ML PO SUSP
30.0000 mL | ORAL | Status: DC | PRN
  Administered 2018-11-21: 30 mL via ORAL
  Filled 2018-11-21: qty 30

## 2018-11-21 MED ORDER — FERROUS SULFATE 325 (65 FE) MG PO TABS: 325.0000 mg | ORAL_TABLET | Freq: Two times a day (BID) | ORAL | 0 refills | Status: DC

## 2018-11-21 NOTE — Progress Notes (Signed)
PT Cancellation Note  Patient Details Name: Eric Hood MRN: [id number] DOB: 1981-07-30   Cancelled Treatment:    Reason Eval/Treat Not Completed: Patient declined, no reason specified;PT screened, no needs identified, will sign off : Pt reports he feels he is at baseline strength and cardiovascular endurance, he is just feeling fatigued due to anemia. Pt reports he has no acute or d/c PT needs, and states he has plenty of assist at home if he needs it. PT to sign off, reconsult if needed.   Julien Girt, PT Acute Rehabilitation Services Pager [phone]  Office [place] 11/21/2018, 1:08 PM

## 2018-11-21 NOTE — Discharge Summary (Signed)
Discharge Summary  Eric Hood FIE:[id number] DOB: 1981/02/26  PCP: Eric Post, MD  Admit date: 11/19/2018 Discharge date: 11/21/2018  Time spent: 35 minutes  Recommendations for Outpatient Follow-up:  1. Follow-up with GI tomorrow, Eric Hood from [place] at 11 am or as scheduled. 2. Follow up with your PCP 3. Take your medications as prescribed  Discharge Diagnoses:  Active Hospital Problems   Diagnosis Date Noted  . Acute on chronic anemia 11/19/2018  . Methadone dependence ([facility]) 11/19/2018  . Fever 11/19/2018  . History of esophagitis and stricture 10/23/2014    Resolved Hospital Problems  No resolved problems to display.    Discharge Condition: Stable   Diet recommendation: Resume previous diet   Vitals:   11/21/18 1330 11/21/18 1349  BP: 115/69 115/70  Pulse: 86 72  Resp: (!) 21 15  Temp: 99 F (37.2 C) 98.7 F (37.1 C)  SpO2: 97% 99%    History of present illness:  Eric Gombos Jonesis a 38 y.o.malewith medical history significant fordysphagia, chronic anemia, chronic methadose use presents with worsening anemia.Been feeling more fatigued over the past week or so. He has chronic anemia and actually called his GI doctor and has a follow-up appointment Friday 11/22/18 at 11 am. Blood work done with PCP showed hemoglobin of 7.5. Repeat here was 7.3. He has chronic anemia baseline appears to be an 8 . Has history of dysphagia status Hood esophageal dilatation last September. Has been having increasing dysphagia symptoms. He denies any nausea vomiting but has had intermittent loose stools for the past 2 weeks. Patient was on chronic methadone decided to stop it about a week ago on his own. He denies any chest pain or shortness of breath. He has had a cough.  His chest x-ray was negative. FOBT negative. No signs of overt bleeding.  Admitted for symptomatic anemia and transfused 1 U PRBC on 11/19/18. Persistent generalized weakness with Hg 7.5.  Another unit PRBC transfused on 11/21/18. Urine and blood cultures negative. LFTs and total bilirubin normal.  Severe iron deficiency anemia; received iron supplement infusion.  11/21/2018: Patient seen and examined at his bedside.  No acute events overnight.  Receive 1 unit PRBC prior to discharge.  Patient has an follow-up appointment tomorrow with GI.  He understands the importance of keeping the appointment and agrees to Hood.  On the day of discharge, the patient was hemodynamically stable.  He will need to follow-up with GI [place] tomorrow 11/22/2018, follow-up with his PCP, and take his medications as prescribed.    Hospital Course:  Principal Problem:   Acute on chronic anemia Active Problems:   History of esophagitis and stricture   Methadone dependence (HCC)   Fever  Symptomatic anemia/severe iron deficiency anemia Presented with hemoglobin 7.3 with generalized weakness Transfused 1 unit of PRBC on 11/19/18 Hemoglobin increased to 7.5 with MCV 68 Negative FOBT No sign of overt bleeding Iron studies significant for severe iron deficiency Received IV iron supplement on 11/20/18 C/w  ferrous sulfate 325 mg twice daily Hg 7.5 transfuse another unit on 11/21/18  Medical noncompliance Patient advised to take his medications as prescribed He understands and agrees to Hood Continue Protonix 40 mg twice daily Continue Zantac 300 mg nightly  Resolved Acute hypoxic respiratory failure Hood blood transfusion Passed Home O2 evaluation prior to discharge  History of chronic pain syndrome on methadone Wants to restart methadone which he had stopped on his own C/w methadone Keep methadone clinic appointments  Isolated fever on  11/19/18 with Tmax 101.3 No active sign of infection Resolved with no recurrence Blood cx x 2 and urine cx negative No afebrile w no leukocytosis Chest x-ray done on 11/19/2018 and 11/20/2018 no cardiopulmonary disease  Resolving AKI Baseline creatinine appears to  be 0.9 Presented with creatinine of 1.25 Creatinine is improving to 1.13 from 1.22 Continue to avoid nephrotoxic agents   Code Status:FULL  Consults called:Ed called Eric Hood ( [facility])     Discharge Exam: BP 115/70   Pulse 72   Temp 98.7 F (37.1 C) (Oral)   Resp 15   Ht 6' (1.829 m)   Wt 81.8 kg   SpO2 99%   BMI 24.46 kg/m  . General: 38 y.o. year-old male well developed well nourished in no acute distress.  Alert and oriented x3. . Cardiovascular: Regular rate and rhythm with no rubs or gallops.  No thyromegaly or JVD noted.   Marland Kitchen Respiratory: Clear to auscultation with no wheezes or rales. Good inspiratory effort. . Abdomen: Soft nontender nondistended with normal bowel sounds x4 quadrants. . Musculoskeletal: No lower extremity edema. 2/4 pulses in all 4 extremities. . Skin: No ulcerative lesions noted or rashes, . Psychiatry: Mood is appropriate for condition and setting  Discharge Instructions You were cared for by a hospitalist during your hospital stay. If you have any questions about your discharge medications or the care you received while you were in the hospital after you are discharged, you can call the unit and asked to speak with the hospitalist on call if the hospitalist that took care of you is not available. Once you are discharged, your primary care physician will handle any further medical issues. Please note that NO REFILLS for any discharge medications will be authorized once you are discharged, as it is imperative that you return to your primary care physician (or establish a relationship with a primary care physician if you do not have one) for your aftercare needs so that they can reassess your need for medications and monitor your lab values.   Allergies as of 11/21/2018      Reactions   Hydrocodone Nausea And Vomiting      Medication List    TAKE these medications   ferrous sulfate 325 (65 FE) MG tablet Take 1 tablet (325 mg total) by mouth  2 (two) times daily with a meal.   hydrOXYzine 25 MG tablet Commonly known as:  ATARAX/VISTARIL Take one to two tablets qhs prn insomnia.   methadone 10 MG/ML solution Commonly known as:  DOLOPHINE Take 70 mg by mouth daily.   pantoprazole 40 MG tablet Commonly known as:  PROTONIX Take 1 tablet (40 mg total) by mouth 2 (two) times daily before a meal. What changed:    when to take this  reasons to take this   promethazine 25 MG tablet Commonly known as:  PHENERGAN Take 25 mg by mouth every 6 (six) hours as needed for nausea or vomiting.   ranitidine 300 MG tablet Commonly known as:  ZANTAC Take 1 tablet (300 mg total) by mouth at bedtime.   VISINE OP Apply 1 drop to eye daily as needed (dry eyes).      Allergies  Allergen Reactions  . Hydrocodone Nausea And Vomiting   Follow-up Information    Eric Post, MD. Call in 1 day(s).   Specialty:  Family Medicine Why:  Please call for a Hood hospital follow-up appointment. Contact information: [place] [address] [phone]  Ladene Artist, MD. Call in 1 day(s).   Specialty:  Gastroenterology Why:  Please keep your appointment tomorrow 11/22/2018  Contact information: [address] [place] [address] [phone]            The results of significant diagnostics from this hospitalization (including imaging, microbiology, ancillary and laboratory) are listed below for reference.    Significant Diagnostic Studies: Dg Chest 2 View  Result Date: 11/19/2018 CLINICAL DATA:  38 year old male with anemia.  Weakness.  Cough. EXAM: CHEST - 2 VIEW COMPARISON:  Chest radiographs 05/18/2015 and earlier. FINDINGS: Normal lung volumes and mediastinal contours. Visualized tracheal air column is within normal limits. Both lungs are clear. No pneumothorax or pleural effusion. No acute osseous abnormality identified. Negative visible bowel gas pattern. IMPRESSION: Negative.  No  cardiopulmonary abnormality. Electronically Signed   By: Genevie Ann M.D.   On: 11/19/2018 16:38   Dg Chest Port 1 View  Result Date: 11/20/2018 CLINICAL DATA:  Wheezing EXAM: PORTABLE CHEST 1 VIEW COMPARISON:  Yesterday FINDINGS: Normal heart size and mediastinal contours. There is no edema, consolidation, effusion, or pneumothorax. No osseous findings. IMPRESSION: Negative portable chest. Electronically Signed   By: Monte Fantasia M.D.   On: 11/20/2018 09:47    Microbiology: Recent Results (from the past 240 hour(s))  Blood culture (routine x 2)     Status: None (Preliminary result)   Collection Time: 11/19/18  3:35 PM  Result Value Ref Range Status   Specimen Description   Final    BLOOD BLOOD LEFT FOREARM Performed at [facility], [place] [address], [place], [place] [address]    Special Requests   Final    BOTTLES DRAWN AEROBIC ONLY Blood Culture adequate volume Performed at [facility] [address] [place], [place] [address]    Culture   Final    NO GROWTH 2 DAYS Performed at [facility] [address], [place], [place] [address]    Report Status PENDING  Incomplete  Blood culture (routine x 2)     Status: None (Preliminary result)   Collection Time: 11/19/18  8:46 PM  Result Value Ref Range Status   Specimen Description   Final    BLOOD LEFT ARM Performed at [facility] [address], [place], [place] [address]    Special Requests   Final    BOTTLES DRAWN AEROBIC AND ANAEROBIC Blood Culture results may not be optimal due to an inadequate volume of blood received in culture bottles Performed at [facility] [address] [place], [place] [address]    Culture   Final    NO GROWTH 2 DAYS Performed at [facility] [address], [place], [place] [address]    Report Status PENDING  Incomplete  Urine culture     Status: None   Collection Time: 11/20/18  1:00 PM  Result Value Ref  Range Status   Specimen Description   Final    URINE, RANDOM Performed at [facility] [address] [place], [place] [address]    Special Requests   Final    NONE Performed at [facility], [place] [address] [place], [place] [address]    Culture   Final    NO GROWTH Performed at [facility], [place] [address] [place], [place] [address]    Report Status 11/21/2018 FINAL  Final     Labs: Basic Metabolic Panel: Recent Labs  Lab 11/19/18 1536 11/20/18 0408 11/21/18 0415  NA 138 139 138  K 3.6 4.1 3.9  CL 104 108 105  CO2 26 26 28   GLUCOSE 99 102* 115*  BUN 13 14 7   CREATININE 1.25* 1.22 1.13  CALCIUM 8.7* 8.2* 8.1*   Liver Function Tests: Recent Labs  Lab 11/19/18 1536 11/21/18 0415  AST 21 20  ALT 18 16  ALKPHOS 97 77  BILITOT 0.2* 0.2*  PROT 7.3 6.0*  ALBUMIN 3.7 3.2*   No results for input(s): LIPASE, AMYLASE in the last 168 hours. No results for input(s): AMMONIA in the last 168 hours. CBC: Recent Labs  Lab 11/19/18 0921 11/19/18 1231 11/19/18 1536 11/20/18 0408 11/20/18 1932 11/21/18 0415  WBC 8.5 8.8 8.2 5.8  --  6.1  NEUTROABS 6.3 6,820  --   --   --  3.2  HGB 7.8 Repeated and verified X2.* 7.8* 7.3* 7.4* 7.5* 7.5*  HCT 26.6 Repeated and verified X2.* 29.7* 28.1* 28.1* 28.1* 28.4*  MCV 57.8 Repeated and verified X2.* 63.7* 65.0* 68.2*  --  67.6*  PLT 348.0 262 351 291  --  282   Cardiac Enzymes: No results for input(s): CKTOTAL, CKMB, CKMBINDEX, TROPONINI in the last 168 hours. BNP: BNP (last 3 results) No results for input(s): BNP in the last 8760 hours.  ProBNP (last 3 results) No results for input(s): PROBNP in the last 8760 hours.  CBG: No results for input(s): GLUCAP in the last 168 hours.     Signed:  Kayleen Memos, MD [facility] 11/21/2018, 3:17 PM

## 2018-11-21 NOTE — Discharge Instructions (Signed)
Preventing Iron Deficiency Anemia, Adult  Iron deficiency is having a lack of iron in the body. Iron is an important mineral that your body needs to build healthy red blood cells. Iron deficiency anemia is a condition in which the concentration of red blood cells or hemoglobin in the blood is below normal because of too little iron. Hemoglobin is a substance in red blood cells that carries oxygen to the body's tissues. You may develop iron deficiency anemia due to:  Blood loss from an injury or condition such as Crohn's disease.  Your body being unable to properly absorb iron and use it to create red blood cells.  Lack of iron in your diet. You can prevent iron deficiency anemia by making certain changes to your diet and lifestyle. What nutrition changes can be made?  Eat foods that are high in iron, such as: ? Red meat, especially liver and beef. ? Poultry. ? Seafood. ? Dried fruit. ? Prune juice. ? Nuts. ? Pumpkin seeds. ? Beans. ? Leafy green vegetables. ? Molasses. ? Tofu.  Look for foods that have added iron (are fortified). Many cereals and breads are iron fortified.  Eat foods that contain vitamin C along with iron-rich foods, preferably in the same meal. Vitamin C increases your body's ability to absorb iron. Foods high in vitamin C include: ? Citrus fruits, such as lemons, oranges, and grapefruits. ? Berries. ? Bell peppers. ? Tomatoes. ? Broccoli.  Do not follow a diet that is very low in fat or very high in fiber.  Do not drink very large amounts of milk, tea, or coffee. What actions can I take to lower my risk? It is important to know whether you are at risk for iron deficiency anemia. Ask your health care provider if you need a blood test to measure your iron or red blood cells. You may have a higher risk for iron deficiency anemia if:  You are a woman and one of the following applies: ? You have heavy menstrual periods. ? You are pregnant. ? You are  breastfeeding.  You have had bypass surgery for weight loss.  You have a digestive disorder such as Crohn's disease, irritable bowel syndrome, or celiac disease.  You regularly take antacids or acid-lowering medicines.  You follow a vegetarian or vegan diet. To lower your risk for iron deficiency:  If you are a vegetarian or vegan, talk with your health care provider or a dietitian about: ? Taking an iron supplement. ? Adding more iron-rich foods to your diet.  Limit your use of antacids or acid-lowering medicines.  If you have heavy menstrual periods, are pregnant, or are breastfeeding, ask your health care provider about taking an iron supplement.  Work with your health care provider to manage conditions that can cause iron deficiency.  Take over-the-counter and prescription medicines only as told by your health care provider.  Keep all follow-up visits as told by your health care provider. This is important. Why are these changes important? It is important to make these changes so that you do not develop iron deficiency anemia. Iron-rich foods help your body produce more red blood cells and have more energy. What can happen if changes are not made? If you do not make these changes, you could develop iron deficiency anemia. If not treated, iron deficiency anemia can lead to serious health complications, including:  Long-term (chronic) fatigue.  Shortness of breath.  Abnormal heart rhythms.  Heart failure.  Uncomfortable sensations and an overwhelming urge  to move your legs (restless legs syndrome).  Weakened disease-fighting system (immune system). Where to find more information Learn more about preventing iron deficiency from:  [place], Lung, and [place]: https://wilson-eaton.com/  American Society of Hematology: www.hematology.org Contact a health care provider if you:  Develop symptoms of iron deficiency, including: ? Fatigue. ? Headache. ? Pale skin,  lips, and nail beds. ? Poor appetite. ? Weakness. Summary  Iron deficiency anemia is a condition in which the concentration of red blood cells or hemoglobin in the blood is below normal because of too little iron.  You can help prevent iron deficiency anemia by eating more iron-rich foods, such as red meat, poultry, or tofu. Look for foods that have added iron (are fortified), such as cereals.  Eat foods that contain vitamin C along with iron-rich foods, preferably in the same meal. Vitamin C increases the body's ability to absorb iron.  Ask your health care provider about your risk for iron deficiency anemia and whether a multivitamin or supplement may be right for you. This information is not intended to replace advice given to you by your health care provider. Make sure you discuss any questions you have with your health care provider. Document Released: 08/02/2017 Document Revised: 08/02/2017 Document Reviewed: 08/02/2017 Elsevier Interactive Patient Education  2019 [employer].   Iron Deficiency Anemia, Adult Iron-deficiency anemia is when you have a low amount of red blood cells or hemoglobin. This happens because you have too little iron in your body. Hemoglobin carries oxygen to parts of the body. Anemia can cause your body to not get enough oxygen. It may or may not cause symptoms. Follow these instructions at home: Medicines  Take over-the-counter and prescription medicines only as told by your doctor. This includes iron pills (supplements) and vitamins.  If you cannot handle taking iron pills by mouth, ask your doctor about getting iron through: ? A vein (intravenously). ? A shot (injection) into a muscle.  Take iron pills when your stomach is empty. If you cannot handle this, take them with food.  Do not drink milk or take antacids at the same time as your iron pills.  To prevent trouble pooping (constipation), eat fiber or take medicine (stool softener) as told by your  doctor. Eating and drinking   Talk with your doctor before changing the foods you eat. He or she may tell you to eat foods that have a lot of iron, such as: ? Liver. ? Lowfat (lean) beef. ? Breads and cereals that have iron added to them (fortified breads and cereals). ? Eggs. ? Dried fruit. ? Dark green, leafy vegetables.  Drink enough fluid to keep your pee (urine) clear or pale yellow.  Eat fresh fruits and vegetables that are high in vitamin C. They help your body to use iron. Foods with a lot of vitamin C include: ? Oranges. ? Peppers. ? Tomatoes. ? Mangoes. General instructions  Return to your normal activities as told by your doctor. Ask your doctor what activities are safe for you.  Keep yourself clean, and keep things clean around you (your surroundings). Anemia can make you get sick more easily.  Keep all follow-up visits as told by your doctor. This is important. Contact a doctor if:  You feel sick to your stomach (nauseous).  You throw up (vomit).  You feel weak.  You are sweating for no clear reason.  You have trouble pooping, such as: ? Pooping (having a bowel movement) less than 3 times  a week. ? Straining to poop. ? Having poop that is hard, dry, or larger than normal. ? Feeling full or bloated. ? Pain in the lower belly. ? Not feeling better after pooping. Get help right away if:  You pass out (faint). If this happens, do not drive yourself to the hospital. Call your local emergency services (911 in the [place]  You have chest pain.  You have shortness of breath that: ? Is very bad. ? Gets worse with physical activity.  You have a fast heartbeat.  You get light-headed when getting up from sitting or lying down. This information is not intended to replace advice given to you by your health care provider. Make sure you discuss any questions you have with your health care provider. Document Released: 11/04/2010 Document Revised: 06/21/2016  Document Reviewed: 06/21/2016 Elsevier Interactive Patient Education  2019 [employer].

## 2018-11-21 NOTE — Progress Notes (Signed)
SATURATION QUALIFICATIONS: (This note is used to comply with regulatory documentation for home oxygen)  Patient Saturations on Room Air at Rest = 94-97%  Patient Saturations on Room Air while Ambulating = 92%

## 2018-11-21 NOTE — Progress Notes (Signed)
Patient given discharge, follow up, and medication instructions, verbalized understanding, IV and telemetry monitor removed, personal belongings with patient, self transport home

## 2018-11-22 ENCOUNTER — Encounter: Payer: Self-pay | Admitting: Gastroenterology

## 2018-11-22 ENCOUNTER — Ambulatory Visit (INDEPENDENT_AMBULATORY_CARE_PROVIDER_SITE_OTHER): Payer: Self-pay | Admitting: Gastroenterology

## 2018-11-22 VITALS — BP 130/70 | HR 84 | Ht 72.0 in | Wt 184.0 lb

## 2018-11-22 DIAGNOSIS — R1310 Dysphagia, unspecified: Secondary | ICD-10-CM

## 2018-11-22 DIAGNOSIS — R1319 Other dysphagia: Secondary | ICD-10-CM

## 2018-11-22 DIAGNOSIS — K2100 Gastro-esophageal reflux disease with esophagitis, without bleeding: Secondary | ICD-10-CM

## 2018-11-22 DIAGNOSIS — D500 Iron deficiency anemia secondary to blood loss (chronic): Secondary | ICD-10-CM

## 2018-11-22 LAB — BPAM RBC
Blood Product Expiration Date: 202003022359
Blood Product Expiration Date: 202003022359
ISSUE DATE / TIME: 202002042206
ISSUE DATE / TIME: 202002061320
Unit Type and Rh: 5100
Unit Type and Rh: 5100

## 2018-11-22 LAB — TYPE AND SCREEN
ABO/RH(D): O POS
Antibody Screen: NEGATIVE
UNIT DIVISION: 0
Unit division: 0

## 2018-11-22 NOTE — Telephone Encounter (Signed)
Patient in the office today.  He was inpatient yesterday and had feraheme x 1.  Per Dr. Fuller Plan cancel one of the planned infusions.  Infusion for 12/03/18 cancelled.

## 2018-11-22 NOTE — Patient Instructions (Addendum)
Stop taking oral iron supplement.   You MUST take your Protonix twice daily and Zantac daily.   Keep your Feraheme infusion on 11/26/18 at 10:00am. Please go to [facility] patient care center [phone] Eric Hood) [address].   Please come back in 2 weeks (around 12/06/18) for a blood count in our basement lab.   You have been scheduled for an endoscopy. Please follow written instructions given to you at your visit today. If you use inhalers (even only as needed), please bring them with you on the day of your procedure. Your physician has requested that you go to www.startemmi.com and enter the access code given to you at your visit today. This web site gives a general overview about your procedure. However, you should still follow specific instructions given to you by our office regarding your preparation for the procedure.  Thank you for choosing me and [facility].  Pricilla Riffle. Dagoberto Ligas., MD., Marval Regal

## 2018-11-22 NOTE — Progress Notes (Signed)
    History of Present Illness: This is a 38 year old male hospitalized for worsening anemia and fever 2/4 - 2/6 given 2 U PRBC and fever resolved without a clear etiology found. Worsening solid food dysphagia and intermittent noncompliance with Protonix and Zantac. Dry cough for past couple days.  X-ray negative on 2/4. Intravenous iron was given on 2/5 in the hospital.  Colonoscopy performed in February 2016 was normal.  Last EGD in 05/2018: - Benign-appearing esophageal stenosis. Dilated. - Non-bleeding esophageal ulcer. - LA Grade C reflux esophagitis. - Medium-sized hiatal hernia. - Normal duodenal bulb and second portion of the duodenum.  Current Medications, Allergies, Past Medical History, Past Surgical History, Family History and Social History were reviewed in [employer] record.  Physical Exam: General: Well developed, well nourished, no acute distress Head: Normocephalic and atraumatic Eyes:  sclerae anicteric, EOMI Ears: Normal auditory acuity Mouth: No deformity or lesions Lungs: Clear throughout to auscultation Heart: Regular rate and rhythm; no murmurs, rubs or bruits Abdomen: Soft, non tender and non distended. No masses, hepatosplenomegaly or hernias noted. Normal Bowel sounds Rectal: Not done Musculoskeletal: Symmetrical with no gross deformities  Pulses:  Normal pulses noted Extremities: No clubbing, cyanosis, edema or deformities noted Neurological: Alert oriented x 4, grossly nonfocal Psychological:  Alert and cooperative. Normal mood and affect   Assessment and Recommendations:  1.  GERD, esophageal stricture, erosive esophagitis, history of esophageal ulcer, worsening dysphagia.  Recurrent esophageal stricture and ongoing esophagitis is strongly suspected.  Intermittent or frequent noncompliance with medications.  Ongoing and strict compliance with antireflux treatment was again emphasized.  Continue pantoprazole 40 mg twice daily and  ranitidine 300 mg at bedtime.  Schedule EGD with possible dilation. The risks (including bleeding, perforation, infection, missed lesions, medication reactions and possible hospitalization or surgery if complications occur), benefits, and alternatives to endoscopy with possible biopsy and possible dilation were discussed with the patient and they consent to proceed.   2. Iron deficiency anemia due to losses from chronic esophagitis. Feraheme x 1 to complete 2 doses. Discontinue and do not use oral Fe as Fe may cause esophagitis given his recurrent, persistent esophageal stricture. Repeat CBC in 2 weeks.  Further follow-up of iron deficiency anemia with PCP.  3. Dry cough. Follow up with PCP.

## 2018-11-24 ENCOUNTER — Emergency Department (HOSPITAL_COMMUNITY): Payer: Self-pay

## 2018-11-24 ENCOUNTER — Other Ambulatory Visit: Payer: Self-pay

## 2018-11-24 ENCOUNTER — Encounter (HOSPITAL_COMMUNITY): Payer: Self-pay

## 2018-11-24 DIAGNOSIS — R0989 Other specified symptoms and signs involving the circulatory and respiratory systems: Secondary | ICD-10-CM | POA: Insufficient documentation

## 2018-11-24 DIAGNOSIS — R0602 Shortness of breath: Secondary | ICD-10-CM | POA: Insufficient documentation

## 2018-11-24 DIAGNOSIS — F419 Anxiety disorder, unspecified: Secondary | ICD-10-CM | POA: Insufficient documentation

## 2018-11-24 DIAGNOSIS — Z79899 Other long term (current) drug therapy: Secondary | ICD-10-CM | POA: Insufficient documentation

## 2018-11-24 LAB — CBC WITH DIFFERENTIAL/PLATELET
Abs Immature Granulocytes: 0.05 10*3/uL (ref 0.00–0.07)
BASOS PCT: 1 %
Basophils Absolute: 0 10*3/uL (ref 0.0–0.1)
Eosinophils Absolute: 0.5 10*3/uL (ref 0.0–0.5)
Eosinophils Relative: 7 %
HCT: 40.4 % (ref 39.0–52.0)
Hemoglobin: 10.9 g/dL — ABNORMAL LOW (ref 13.0–17.0)
Immature Granulocytes: 1 %
Lymphocytes Relative: 30 %
Lymphs Abs: 2.2 10*3/uL (ref 0.7–4.0)
MCH: 19 pg — ABNORMAL LOW (ref 26.0–34.0)
MCHC: 27 g/dL — AB (ref 30.0–36.0)
MCV: 70.5 fL — ABNORMAL LOW (ref 80.0–100.0)
Monocytes Absolute: 0.5 10*3/uL (ref 0.1–1.0)
Monocytes Relative: 7 %
NRBC: 0 % (ref 0.0–0.2)
Neutro Abs: 4 10*3/uL (ref 1.7–7.7)
Neutrophils Relative %: 54 %
Platelets: 437 10*3/uL — ABNORMAL HIGH (ref 150–400)
RBC: 5.73 MIL/uL (ref 4.22–5.81)
RDW: 28.6 % — AB (ref 11.5–15.5)
WBC: 7.3 10*3/uL (ref 4.0–10.5)

## 2018-11-24 LAB — COMPREHENSIVE METABOLIC PANEL
ALT: 46 U/L — ABNORMAL HIGH (ref 0–44)
AST: 52 U/L — ABNORMAL HIGH (ref 15–41)
Albumin: 3.6 g/dL (ref 3.5–5.0)
Alkaline Phosphatase: 100 U/L (ref 38–126)
Anion gap: 6 (ref 5–15)
BUN: 15 mg/dL (ref 6–20)
CHLORIDE: 106 mmol/L (ref 98–111)
CO2: 30 mmol/L (ref 22–32)
CREATININE: 1.05 mg/dL (ref 0.61–1.24)
Calcium: 9.1 mg/dL (ref 8.9–10.3)
GFR calc Af Amer: >60 mL/min (ref >60)
GFR calc non Af Amer: >60 mL/min (ref >60)
Glucose, Bld: 96 mg/dL (ref 70–99)
Potassium: 4 mmol/L (ref 3.5–5.1)
Sodium: 142 mmol/L (ref 135–145)
Total Bilirubin: 0.2 mg/dL — ABNORMAL LOW (ref 0.3–1.2)
Total Protein: 7.3 g/dL (ref 6.5–8.1)

## 2018-11-24 LAB — CULTURE, BLOOD (ROUTINE X 2)
Culture: NO GROWTH
Culture: NO GROWTH
Special Requests: ADEQUATE

## 2018-11-24 NOTE — ED Triage Notes (Signed)
Pt states that he is short of breath. Pt states that he feels his throat is closing. Pt states that some food got stuck there yesterday. He thinks he got it out but is uncomfortable.Pt states he needs to have an endoscopy to stretch  His throat.

## 2018-11-25 ENCOUNTER — Telehealth: Payer: Self-pay

## 2018-11-25 ENCOUNTER — Emergency Department (HOSPITAL_COMMUNITY)
Admission: EM | Admit: 2018-11-25 | Discharge: 2018-11-25 | Disposition: A | Discharge status: Home / Self Care | Payer: Self-pay | Attending: Emergency Medicine | Admitting: Emergency Medicine

## 2018-11-25 DIAGNOSIS — R198 Other specified symptoms and signs involving the digestive system and abdomen: Secondary | ICD-10-CM

## 2018-11-25 DIAGNOSIS — R0602 Shortness of breath: Secondary | ICD-10-CM

## 2018-11-25 DIAGNOSIS — K228 Other specified diseases of esophagus: Secondary | ICD-10-CM

## 2018-11-25 NOTE — Telephone Encounter (Signed)
Ladene Artist, MD sent to Marlon Pel, RN      Previous Messages      Routed Note   Author: Ladene Artist, MD Service: Gastroenterology Author Type: Physician  Filed: 11/25/2018 6:20 AM Date of Service: 11/25/2018 6:20 AM Status: Signed  Editor: Ladene Artist, MD (Physician)     Show:Clear all [x] Manual[] Template[] Copied  Added by: [x] Ladene Artist, MD  [] Hover for details He should already have EGD dilation scheduled however we need to move it sooner. I can do 1:00 pm or 7:30 am on Wednesday 2/12.       Patient has been rescheduled to 11/27/18 0730.  He verbalized understanding.  To be NPO after midnight.

## 2018-11-25 NOTE — ED Provider Notes (Signed)
[facility] DEPT Provider Note   CSN: [id number] Arrival date & time: 11/24/18  1800     History   Chief Complaint Chief Complaint  Patient presents with  . Shortness of Breath    HPI Eric Hood is a 38 y.o. male.  The history is provided by the patient.  Shortness of Breath  He has history of ulcerative esophagitis with stricture and comes in stating that some food got stuck in his esophagus yesterday and it feels like it still lodged there.  He is unable to swallow solid foods but is able to swallow liquids.  He states that it was a piece of pot roast it got stuck.  He has had similar episodes in the past.  Over the course of the day today, he has felt dyspneic and dyspnea feels worse laying flat but is not affected by exertion.  There is no cough and he denies any chest pain, heaviness, tightness, pressure.  He states that he has had multiple endoscopies related to his stricture.  Past Medical History:  Diagnosis Date  . Anemia 10/23/2014  . Anxiety   . Back pain   . Blood transfusion without reported diagnosis april 2016  . Esophageal stricture 03/2014  . GERD (gastroesophageal reflux disease)   . Hiatal hernia   . Reflux   . 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 Ulcerative esophagitis 03/2014   severe    Patient Active Problem List   Diagnosis Date Noted  . 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 Fever 11/19/2018  . Acute on chronic anemia 11/19/2018  . Dysphagia   . Thoracic compression fracture ([place]) 10/14/2015  . Trigger point of thoracic region 09/03/2015  . Acute lung injury   . Acute respiratory failure with hypoxia ([place])   . Respiratory distress   . Respiratory failure ([place]) 05/15/2015  . Acute respiratory failure with hypoxemia ([place])   . Aspiration pneumonia ([place])   . Acute encephalopathy   . Narcotic overdose ([place])   . Loss of weight 02/10/2015  . Scaphoid fracture of wrist 01/20/2015  . Acute esophagitis   .  Esophageal stricture   . Food impaction of esophagus   . Acute right lower quadrant pain 11/13/2014  . Anemia 10/23/2014  . Heme positive stool 10/23/2014  . GERD (gastroesophageal reflux disease) 10/23/2014  . Right-sided chest/RUQ pain 10/23/2014  . History of esophagitis and stricture 10/23/2014  . Anxiety state, unspecified 06/15/2014  . Scapular dysfunction 03/29/2014  . Nonallopathic lesion of thoracic region 03/29/2014  . Nonallopathic lesion of cervical region 03/29/2014  . Nonallopathic lesion-rib cage 03/29/2014  . Nonallopathic lesion of sacral region 03/29/2014  . Nonallopathic lesion of lumbosacral region 03/29/2014  . Dysplastic nevus of trunk 03/07/2013  . Thoracic back pain 02/11/2013    Past Surgical History:  Procedure Laterality Date  . BALLOON DILATION N/A 05/20/2015   Procedure: BALLOON DILATION;  Surgeon: Milus Banister, MD;  Location: [facility];  Service: Endoscopy;  Laterality: N/A;  . COLONOSCOPY    . ESOPHAGEAL DILATION     On 3 occasions: 03/2014, 08/2014, 09/2014  . ESOPHAGOGASTRODUODENOSCOPY N/A 05/20/2015   Procedure: ESOPHAGOGASTRODUODENOSCOPY (EGD);  Surgeon: Milus Banister, MD;  Location: [facility];  Service: Endoscopy;  Laterality: N/A;  . ESOPHAGOGASTRODUODENOSCOPY (EGD) WITH PROPOFOL N/A 01/18/2015   Procedure: ESOPHAGOGASTRODUODENOSCOPY (EGD) WITH PROPOFOL;  Surgeon: Lafayette Dragon, MD;  Location: [facility];  Service: Endoscopy;  Laterality: N/A;  . ESOPHAGOGASTRODUODENOSCOPY (EGD) WITH PROPOFOL N/A  09/04/2017   Procedure: ESOPHAGOGASTRODUODENOSCOPY (EGD) WITH PROPOFOL;  Surgeon: Ladene Artist, MD;  Location: [facility];  Service: Endoscopy;  Laterality: N/A;  need fluro  . ESOPHAGOGASTRODUODENOSCOPY (EGD) WITH PROPOFOL N/A 10/01/2017   Procedure: ESOPHAGOGASTRODUODENOSCOPY (EGD) WITH PROPOFOL;  Surgeon: Ladene Artist, MD;  Location: [facility];  Service: Endoscopy;  Laterality: N/A;  . HAND SURGERY Right   . SAVORY DILATION N/A  09/04/2017   Procedure: SAVORY DILATION;  Surgeon: Ladene Artist, MD;  Location: [facility];  Service: Endoscopy;  Laterality: N/A;  . SAVORY DILATION N/A 10/01/2017   Procedure: SAVORY DILATION;  Surgeon: Ladene Artist, MD;  Location: [facility];  Service: Endoscopy;  Laterality: N/A;  . UPPER GASTROINTESTINAL ENDOSCOPY          Home Medications    Prior to Admission medications   Medication Sig Start Date End Date Taking? Authorizing Provider  methadone (DOLOPHINE) 10 MG/ML solution Take 70 mg by mouth daily.    Yes [provider]  pantoprazole (PROTONIX) 40 MG tablet Take 1 tablet (40 mg total) by mouth 2 (two) times daily before a meal. Patient taking differently: Take 40 mg by mouth daily as needed (reflux).  03/05/18  Yes Ladene Artist, MD  promethazine (PHENERGAN) 25 MG tablet Take 25 mg by mouth every 6 (six) hours as needed for nausea or vomiting.   Yes [provider]  ranitidine (ZANTAC) 300 MG tablet Take 1 tablet (300 mg total) by mouth at bedtime. 03/05/18  Yes Ladene Artist, MD  hydrOXYzine (ATARAX/VISTARIL) 25 MG tablet Take one to two tablets qhs prn insomnia. 11/19/18   Burchette, Alinda Sierras, MD    Family History Family History  Problem Relation Age of Onset  . Hypertension Mother   . Hypertension Father   . Colon cancer Neg Hx   . Esophageal cancer Neg Hx   . Rectal cancer Neg Hx   . Stomach cancer Neg Hx   . Colon polyps Neg Hx   . Liver cancer Neg Hx   . Pancreatic cancer Neg Hx     Social History Social History   Tobacco Use  . Smoking status: Never Smoker  . Smokeless tobacco: Never Used  Substance Use Topics  . Alcohol use: Yes    Comment: very rarely  . Drug use: Not Currently    Comment: currently not taking Methadone     Allergies   Hydrocodone   Review of Systems Review of Systems  Respiratory: Positive for shortness of breath.   All other systems reviewed and are negative.    Physical Exam Updated  Vital Signs BP 121/81 (BP Location: Left Arm)   Pulse 95   Temp 98 F (36.7 C) (Oral)   Resp 16   Ht 6' (1.829 m)   Wt 83 kg   SpO2 99%   BMI 24.82 kg/m   Physical Exam Vitals signs and nursing note reviewed.    38 year old male, resting comfortably and in no acute distress. Vital signs are normal. Oxygen saturation is 99%, which is normal. Head is normocephalic and atraumatic. PERRLA, EOMI. Oropharynx is clear.  There is no pooling of secretions. Neck is nontender and supple without adenopathy or JVD. Back is nontender and there is no CVA tenderness. Lungs are clear without rales, wheezes, or rhonchi. Chest is nontender. Heart has regular rate and rhythm without murmur. Abdomen is soft, flat, nontender without masses or hepatosplenomegaly and peristalsis is normoactive. Extremities have no cyanosis or edema, full  range of motion is present. Skin is warm and dry without rash. Neurologic: Mental status is normal, cranial nerves are intact, there are no motor or sensory deficits.  ED Treatments / Results  Labs (all labs ordered are listed, but only abnormal results are displayed) Labs Reviewed  CBC WITH DIFFERENTIAL/PLATELET - Abnormal; Notable for the following components:      Result Value   Hemoglobin 10.9 (*)    MCV 70.5 (*)    MCH 19.0 (*)    MCHC 27.0 (*)    RDW 28.6 (*)    Platelets 437 (*)    All other components within normal limits  COMPREHENSIVE METABOLIC PANEL - Abnormal; Notable for the following components:   AST 52 (*)    ALT 46 (*)    Total Bilirubin 0.2 (*)    All other components within normal limits   Radiology Dg Chest 2 View  Result Date: 11/24/2018 CLINICAL DATA:  Shortness of breath, food stuck in throat, throat closing EXAM: CHEST - 2 VIEW COMPARISON:  11/20/2018 FINDINGS: Lungs are clear.  No pleural effusion or pneumothorax. The heart is normal in size. Visualized osseous structures are within normal limits. IMPRESSION: Normal chest  radiographs. Electronically Signed   By: Julian Hy M.D.   On: 11/24/2018 18:50    Procedures Procedures   Medications Ordered in ED Medications - No data to display   Initial Impression / Assessment and Plan / ED Course  I have reviewed the triage vital signs and the nursing notes.  Pertinent labs & imaging results that were available during my care of the patient were reviewed by me and considered in my medical decision making (see chart for details).  Sense of food being stuck in esophagus but without evidence of complete obstruction.  Old records are reviewed, and he has had numerous endoscopies for esophageal stricture with dilatation with most recent being last August.  Dyspnea of uncertain cause.  No physical findings to suggest etiology and oxygen saturation is actually normal.  I suspect that he may be reacting to the sense of something stuck in his esophagus.  Chest x-ray shows no evidence of pneumonia or other pathology.  Labs show microcytic anemia improved from 2/6, but he did receive a blood transfusion in the interim.  Mild elevation of transaminases is noted and is of uncertain significance.  At this point, I do not see indication for emergent endoscopy.  He will be ambulated in the ED to make sure his oxygen level does not drop, and will be given an oral fluid challenge to see if he can tolerate liquids.  Anticipate having him follow-up with his gastroenterologist as an outpatient.  He tolerated oral fluids and ambulation well.  He is referred back to his gastroenterologist for urgent endoscopy, but no indication for emergent endoscopy.  Given strict return precautions, advised to return if he develops inability to swallow fluids.  Final Clinical Impressions(s) / ED Diagnoses   Final diagnoses:  Sensation of foreign body in esophagus  Shortness of breath    ED Discharge Orders    None       Delora Fuel, MD 38/75/64 [id number]

## 2018-11-25 NOTE — ED Notes (Signed)
Pt ambulated 200 ft without assistance. O2 saturation maintained 96 to 99% on room air. Pt had no new complaints. Pt also given ice water for p/o challenge

## 2018-11-25 NOTE — Discharge Instructions (Addendum)
Return if symptoms are getting worse. °

## 2018-11-25 NOTE — Progress Notes (Signed)
He should already have EGD dilation scheduled however we need to move it sooner. I can do 1:00 pm or 7:30 am on Wednesday 2/12.

## 2018-11-26 ENCOUNTER — Ambulatory Visit (HOSPITAL_COMMUNITY)
Admission: RE | Admit: 2018-11-26 | Discharge: 2018-11-26 | Disposition: A | Discharge status: Home / Self Care | Payer: Self-pay | Source: Ambulatory Visit | Attending: Gastroenterology | Admitting: Gastroenterology

## 2018-11-26 DIAGNOSIS — D509 Iron deficiency anemia, unspecified: Secondary | ICD-10-CM | POA: Insufficient documentation

## 2018-11-26 MED ORDER — SODIUM CHLORIDE 0.9 % IV SOLN
510.0000 mg | Freq: Once | INTRAVENOUS | Status: AC
  Administered 2018-11-26: 510 mg via INTRAVENOUS
  Filled 2018-11-26: qty 17

## 2018-11-26 MED ORDER — SODIUM CHLORIDE 0.9 % IV SOLN
INTRAVENOUS | Status: DC | PRN
  Administered 2018-11-26: 250 mL via INTRAVENOUS

## 2018-11-26 NOTE — Progress Notes (Signed)
PATIENT CARE CENTER NOTE  Diagnosis: Iron Deficiency Anemia    Provider: Dr. Lucio Edward   Procedure: IV Feraheme    Note: Patient received Feraheme infusion. Observed patient for 30 minutes post-infusion. No adverse reaction noted. Vital signs stable. Discharge instructions given. Patient alert, oriented and ambulatory at discharge.

## 2018-11-26 NOTE — Discharge Instructions (Signed)

## 2018-11-27 ENCOUNTER — Encounter: Payer: Self-pay | Admitting: Gastroenterology

## 2018-11-27 ENCOUNTER — Ambulatory Visit (AMBULATORY_SURGERY_CENTER): Payer: Self-pay | Admitting: Gastroenterology

## 2018-11-27 VITALS — BP 100/63 | HR 75 | Temp 99.5°F | Resp 11 | Ht 72.0 in | Wt 184.0 lb

## 2018-11-27 DIAGNOSIS — K2270 Barrett's esophagus without dysplasia: Secondary | ICD-10-CM

## 2018-11-27 DIAGNOSIS — K222 Esophageal obstruction: Secondary | ICD-10-CM

## 2018-11-27 DIAGNOSIS — K2100 Gastro-esophageal reflux disease with esophagitis, without bleeding: Secondary | ICD-10-CM

## 2018-11-27 DIAGNOSIS — R1319 Other dysphagia: Secondary | ICD-10-CM

## 2018-11-27 DIAGNOSIS — R1310 Dysphagia, unspecified: Secondary | ICD-10-CM

## 2018-11-27 MED ORDER — FAMOTIDINE 10 MG PO TABS: 40.0000 mg | ORAL_TABLET | Freq: Every day | ORAL | Status: DC

## 2018-11-27 MED ORDER — SODIUM CHLORIDE 0.9 % IV SOLN: 500.0000 mL | Freq: Once | INTRAVENOUS | Status: DC

## 2018-11-27 NOTE — Patient Instructions (Signed)
Information given to you today on hiatal hernias.  Await pathology results.  Clear liquids until 10am today then soft diet until tomorrow.  Continue present medications including protonix 40 mg twice a day and famotadine 40 mg at bedtime for 1 year.  EGD with dilation in 1 month.   YOU HAD AN ENDOSCOPIC PROCEDURE TODAY AT [facility]:   Refer to the procedure report that was given to you for any specific questions about what was found during the examination.  If the procedure report does not answer your questions, please call your gastroenterologist to clarify.  If you requested that your care partner not be given the details of your procedure findings, then the procedure report has been included in a sealed envelope for you to review at your convenience later.  YOU SHOULD EXPECT: Some feelings of bloating in the abdomen. Passage of more gas than usual.  Walking can help get rid of the air that was put into your GI tract during the procedure and reduce the bloating. If you had a lower endoscopy (such as a colonoscopy or flexible sigmoidoscopy) you may notice spotting of blood in your stool or on the toilet paper. If you underwent a bowel prep for your procedure, you may not have a normal bowel movement for a few days.  Please Note:  You might notice some irritation and congestion in your nose or some drainage.  This is from the oxygen used during your procedure.  There is no need for concern and it should clear up in a day or so.  SYMPTOMS TO REPORT IMMEDIATELY:   Following upper endoscopy (EGD)  Vomiting of blood or coffee ground material  New chest pain or pain under the shoulder blades  Painful or persistently difficult swallowing  New shortness of breath  Fever of 100F or higher  Black, tarry-looking stools  For urgent or emergent issues, a gastroenterologist can be reached at any hour by calling [phone].   DIET:  We do recommend a small meal at first, but  then you may proceed to your regular diet.  Drink plenty of fluids but you should avoid alcoholic beverages for 24 hours.  ACTIVITY:  You should plan to take it easy for the rest of today and you should NOT DRIVE or use heavy machinery until tomorrow (because of the sedation medicines used during the test).    FOLLOW UP: Our staff will call the number listed on your records the next business day following your procedure to check on you and address any questions or concerns that you may have regarding the information given to you following your procedure. If we do not reach you, we will leave a message.  However, if you are feeling well and you are not experiencing any problems, there is no need to return our call.  We will assume that you have returned to your regular daily activities without incident.  If any biopsies were taken you will be contacted by phone or by letter within the next 1-3 weeks.  Please call us at [phone] if you have not heard about the biopsies in 3 weeks.    SIGNATURES/CONFIDENTIALITY: You and/or your care partner have signed paperwork which will be entered into your electronic medical record.  These signatures attest to the fact that that the information above on your After Visit Summary has been reviewed and is understood.  Full responsibility of the confidentiality of this discharge information lies with you and/or your care-partner.

## 2018-11-27 NOTE — Op Note (Signed)
[facility] Patient Name: Eric Hood Procedure Date: 11/27/2018 7:34 AM MRN: [id number] Endoscopist: Ladene Artist , MD Age: 38 Referring MD:  Date of Birth: 24-Jul-1981 Gender: Male Account #: [id number] Procedure:                Upper GI endoscopy Indications:              Therapeutic procedure - esophageal dilation, Iron                            deficiency anemia, Dysphagia Medicines:                Monitored Anesthesia Care Procedure:                Pre-Anesthesia Assessment:                           - Prior to the procedure, a History and Physical                            was performed, and patient medications and                            allergies were reviewed. The patient's tolerance of                            previous anesthesia was also reviewed. The risks                            and benefits of the procedure and the sedation                            options and risks were discussed with the patient.                            All questions were answered, and informed consent                            was obtained. Prior Anticoagulants: The patient has                            taken no previous anticoagulant or antiplatelet                            agents. ASA Grade Assessment: II - A patient with                            mild systemic disease. After reviewing the risks                            and benefits, the patient was deemed in                            satisfactory condition to undergo the procedure.  After obtaining informed consent, the endoscope was                            passed under direct vision. Throughout the                            procedure, the patient's blood pressure, pulse, and                            oxygen saturations were monitored continuously. The                            Model GIF-HQ190 [id number]) scope was introduced                            through the mouth, and  advanced to the second part                            of duodenum. The upper GI endoscopy was                            accomplished without difficulty. The patient                            tolerated the procedure well. Scope In: Scope Out: Findings:                 One benign-appearing, intrinsic moderate stenosis                            was found 26 cm from the incisors. This stenosis                            measured 9 mm (inner diameter). The stenosis was                            traversed after dilation. A guidewire was placed                            and the scope was withdrawn. Dilation was performed                            with a Savary dilator with mild resistance at 11                            mm. The dilation site was examined following                            endoscope reinsertion and showed moderate mucosal                            disruption and moderate improvement in luminal  narrowing.                           LA Grade C (one or more mucosal breaks continuous                            between tops of 2 or more mucosal folds, less than                            75% circumference) esophagitis with no bleeding was                            found in the distal esophagus. 3 small superficial                            ulcers. Biopsies were taken with a cold forceps for                            histology.                           The exam of the esophagus was otherwise normal.                           A medium-sized hiatal hernia was present.                           The exam of the stomach was otherwise normal.                           The duodenal bulb and second portion of the                            duodenum were normal. Complications:            No immediate complications. Estimated Blood Loss:     Estimated blood loss was minimal. Impression:               - Benign-appearing esophageal stenosis. Dilated.                            - LA Grade C reflux esophagitis. Biopsied.                           - Medium-sized hiatal hernia.                           - Normal duodenal bulb and second portion of the                            duodenum. Recommendation:           - Patient has a contact number available for                            emergencies. The signs and symptoms of potential  delayed complications were discussed with the                            patient. Return to normal activities tomorrow.                            Written discharge instructions were provided to the                            patient.                           - Clear liquid diet for 2 hours, then advance as                            tolerated to soft diet today.                           - EGD with Savary dilation in 1 month.                           - Continue present medications including Protonix                            40 mg po bid and famotidine 40 mg po hs. 1 year of                            refills of both.                           - Await pathology results. Ladene Artist, MD 11/27/2018 7:58:54 AM This report has been signed electronically.

## 2018-11-27 NOTE — Progress Notes (Signed)
Pt's states no medical or surgical changes since previsit or office visit. 

## 2018-11-27 NOTE — Progress Notes (Signed)
Called to room to assist during endoscopic procedure.  Patient ID and intended procedure confirmed with present staff. Received instructions for my participation in the procedure from the performing physician.  

## 2018-11-27 NOTE — Progress Notes (Signed)
Report given to PACU, vss 

## 2018-11-28 ENCOUNTER — Telehealth: Payer: Self-pay | Admitting: *Deleted

## 2018-11-28 NOTE — Telephone Encounter (Signed)
  Follow up Call-  Call back number 11/27/2018 06/05/2018 04/23/2018 03/08/2018 10/31/2017 07/24/2017  Post procedure Call Back phone  # [phone]  Permission to leave phone message Yes Yes Yes Yes Yes Yes  Some recent data might be hidden     Patient questions:  Do you have a fever, pain , or abdominal swelling? No. Pain Score  0 *  Have you tolerated food without any problems? Yes.    Have you been able to return to your normal activities? Yes.    Do you have any questions about your discharge instructions: Diet   No. Medications  No. Follow up visit  No.  Do you have questions or concerns about your Care? No.  Actions: * If pain score is 4 or above: No action needed, pain <4.

## 2018-12-03 ENCOUNTER — Encounter (HOSPITAL_COMMUNITY): Payer: Self-pay

## 2018-12-09 ENCOUNTER — Other Ambulatory Visit (INDEPENDENT_AMBULATORY_CARE_PROVIDER_SITE_OTHER): Payer: Self-pay

## 2018-12-09 DIAGNOSIS — R1310 Dysphagia, unspecified: Secondary | ICD-10-CM

## 2018-12-09 DIAGNOSIS — R1319 Other dysphagia: Secondary | ICD-10-CM

## 2018-12-09 DIAGNOSIS — D500 Iron deficiency anemia secondary to blood loss (chronic): Secondary | ICD-10-CM

## 2018-12-09 LAB — CBC WITH DIFFERENTIAL/PLATELET
Basophils Absolute: 0.1 10*3/uL (ref 0.0–0.1)
Basophils Relative: 1 % (ref 0.0–3.0)
EOS ABS: 0 10*3/uL (ref 0.0–0.7)
Eosinophils Relative: 0.6 % (ref 0.0–5.0)
HCT: 43.8 % (ref 39.0–52.0)
Hemoglobin: 13.6 g/dL (ref 13.0–17.0)
Lymphocytes Relative: 19.2 % (ref 12.0–46.0)
Lymphs Abs: 1.5 10*3/uL (ref 0.7–4.0)
MCHC: 31 g/dL (ref 30.0–36.0)
MCV: 73.2 fl — ABNORMAL LOW (ref 78.0–100.0)
Monocytes Absolute: 0.4 10*3/uL (ref 0.1–1.0)
Monocytes Relative: 4.9 % (ref 3.0–12.0)
Neutro Abs: 5.7 10*3/uL (ref 1.4–7.7)
Neutrophils Relative %: 74.3 % (ref 43.0–77.0)
Platelets: 517 10*3/uL — ABNORMAL HIGH (ref 150.0–400.0)
RBC: 5.98 Mil/uL — ABNORMAL HIGH (ref 4.22–5.81)
RDW: 36.8 % — ABNORMAL HIGH (ref 11.5–15.5)
WBC: 7.7 10*3/uL (ref 4.0–10.5)

## 2018-12-16 ENCOUNTER — Encounter: Payer: Self-pay | Admitting: Gastroenterology

## 2018-12-19 ENCOUNTER — Encounter: Payer: Self-pay | Admitting: Gastroenterology

## 2019-01-02 ENCOUNTER — Telehealth: Payer: Self-pay

## 2019-01-02 NOTE — Telephone Encounter (Signed)
Covid-19 travel screening questions  Have you traveled in the last 14 days? No. If yes where?  Do you now or have you had a fever in the last 14 days? No.  Do you have any respiratory symptoms of shortness of breath or cough now or in the last 14 days? No.  Do you have a medical history of Congestive Heart Failure?  Do you have a medical history of lung disease?  Do you have any family members or close contacts with diagnosed or suspected Covid-19? No.    Patient understands and agrees that the care partner will stay in their car during the procedure and will be called at time of discharge to be picked up.

## 2019-01-03 ENCOUNTER — Encounter: Payer: Self-pay | Admitting: Gastroenterology

## 2019-01-03 ENCOUNTER — Telehealth: Payer: Self-pay | Admitting: *Deleted

## 2019-01-03 ENCOUNTER — Other Ambulatory Visit: Payer: Self-pay

## 2019-01-03 ENCOUNTER — Ambulatory Visit (AMBULATORY_SURGERY_CENTER): Payer: Self-pay | Admitting: Gastroenterology

## 2019-01-03 VITALS — BP 118/57 | HR 73 | Temp 96.8°F | Resp 14 | Ht 72.0 in | Wt 184.0 lb

## 2019-01-03 DIAGNOSIS — R1310 Dysphagia, unspecified: Secondary | ICD-10-CM

## 2019-01-03 DIAGNOSIS — K222 Esophageal obstruction: Secondary | ICD-10-CM

## 2019-01-03 DIAGNOSIS — K2210 Ulcer of esophagus without bleeding: Secondary | ICD-10-CM

## 2019-01-03 DIAGNOSIS — K449 Diaphragmatic hernia without obstruction or gangrene: Secondary | ICD-10-CM

## 2019-01-03 DIAGNOSIS — R1319 Other dysphagia: Secondary | ICD-10-CM

## 2019-01-03 MED ORDER — SODIUM CHLORIDE 0.9 % IV SOLN: 500.0000 mL | Freq: Once | INTRAVENOUS | Status: DC

## 2019-01-03 MED ORDER — FAMOTIDINE 20 MG PO TABS: 40.0000 mg | ORAL_TABLET | Freq: Every day | ORAL | 7 refills | Status: DC

## 2019-01-03 NOTE — Op Note (Signed)
[place] Patient Name: Eric Hood Procedure Date: 01/03/2019 10:02 AM MRN: [id number] Endoscopist: Ladene Artist , MD Age: 38 Referring MD:  Date of Birth: 11/08/1980 Gender: Male Account #: [id number] Procedure:                Upper GI endoscopy Indications:              Therapeutic procedure for stricture dilation,                            Dysphagia, Gastroesophageal reflux disease Medicines:                Monitored Anesthesia Care Procedure:                Pre-Anesthesia Assessment:                           - Prior to the procedure, a History and Physical                            was performed, and patient medications and                            allergies were reviewed. The patient's tolerance of                            previous anesthesia was also reviewed. The risks                            and benefits of the procedure and the sedation                            options and risks were discussed with the patient.                            All questions were answered, and informed consent                            was obtained. Prior Anticoagulants: The patient has                            taken no previous anticoagulant or antiplatelet                            agents. ASA Grade Assessment: II - A patient with                            mild systemic disease. After reviewing the risks                            and benefits, the patient was deemed in                            satisfactory condition to undergo the procedure.  After obtaining informed consent, the endoscope was                            passed under direct vision. Throughout the                            procedure, the patient's blood pressure, pulse, and                            oxygen saturations were monitored continuously. The                            Endoscope was introduced through the mouth, and                            advanced to the  second part of duodenum. The upper                            GI endoscopy was accomplished without difficulty.                            The patient tolerated the procedure well. Scope In: Scope Out: Findings:                 One benign-appearing, intrinsic moderate stenosis                            was found 26 cm from the incisors. This stenosis                            measured 9 mm (inner diameter). The stenosis was                            traversed after dilation. A guidewire was placed                            and the scope was withdrawn. Dilations were                            performed with Savary dilators with mild resistance                            at 11 mm, 12 mm and 13 mm. The dilation site was                            examined following endoscope reinsertion and showed                            moderate mucosal disruption. Estimated blood loss                            was minimal.  Three cratered (1 distal esophagus) and superficial                            esophageal ulcers (2 mid esophagus) with no                            bleeding and no stigmata of recent bleeding were                            found at 36 cm and 28 cm from the incisors,                            respectively. The largest lesion was 8 mm in                            largest dimension.                           There were esophageal mucosal changes secondary to                            established long-segment Barrett's disease present                            in the mid esophagus and in the distal esophagus.                            The maximum longitudinal extent of these mucosal                            changes was 10 cm in length.                           The exam of the esophagus was otherwise normal.                           A medium-sized hiatal hernia was present.                           The exam of the stomach was otherwise normal.                            The duodenal bulb and second portion of the                            duodenum were normal. Complications:            No immediate complications. Estimated Blood Loss:     Estimated blood loss was minimal. Impression:               - Benign-appearing esophageal stenosis. Dilated.                           - Non-bleeding esophageal ulcers.                           -  Esophageal mucosal changes secondary to                            established long-segment Barrett's disease.                           - Medium-sized hiatal hernia.                           - Normal duodenal bulb and second portion of the                            duodenum.                           - No specimens collected. Recommendation:           - Patient has a contact number available for                            emergencies. The signs and symptoms of potential                            delayed complications were discussed with the                            patient. Return to normal activities tomorrow.                            Written discharge instructions were provided to the                            patient.                           - Clear liquid diet for 2 hours, then advance as                            tolerated to soft diet today.                           - Continue present medications including                            pantoprazole 40 mg po bid ac and famotidine 40 mg                            po hs (1 year of refills).                           - Repeat upper endoscopy in 1 month for                            retreatment, repeat dilation.                           -  No aspirin, ibuprofen, naproxen, or other                            non-steroidal anti-inflammatory drugs. Ladene Artist, MD 01/03/2019 10:36:38 AM This report has been signed electronically.

## 2019-01-03 NOTE — Progress Notes (Signed)
Report to PACU, RN, vss, BBS= Clear.  

## 2019-01-03 NOTE — Telephone Encounter (Signed)
Next endoscopy set up and instructions given.  Ambulatory GI referral put in

## 2019-01-03 NOTE — Patient Instructions (Signed)
YOU HAD AN ENDOSCOPIC PROCEDURE TODAY AT [facility]:   Refer to the procedure report that was given to you for any specific questions about what was found during the examination.  If the procedure report does not answer your questions, please call your gastroenterologist to clarify.  If you requested that your care partner not be given the details of your procedure findings, then the procedure report has been included in a sealed envelope for you to review at your convenience later.  YOU SHOULD EXPECT: Some feelings of bloating in the abdomen. Passage of more gas than usual.  Walking can help get rid of the air that was put into your GI tract during the procedure and reduce the bloating. If you had a lower endoscopy (such as a colonoscopy or flexible sigmoidoscopy) you may notice spotting of blood in your stool or on the toilet paper. If you underwent a bowel prep for your procedure, you may not have a normal bowel movement for a few days.  Please Note:  You might notice some irritation and congestion in your nose or some drainage.  This is from the oxygen used during your procedure.  There is no need for concern and it should clear up in a day or so.  SYMPTOMS TO REPORT IMMEDIATELY:   Following upper endoscopy (EGD)  Vomiting of blood or coffee ground material  New chest pain or pain under the shoulder blades  Painful or persistently difficult swallowing  New shortness of breath  Fever of 100F or higher  Black, tarry-looking stools  For urgent or emergent issues, a gastroenterologist can be reached at any hour by calling [phone].   DIET:  We do recommend a small meal at first, but then you may proceed to your regular diet.  Drink plenty of fluids but you should avoid alcoholic beverages for 24 hours.  ACTIVITY:  You should plan to take it easy for the rest of today and you should NOT DRIVE or use heavy machinery until tomorrow (because of the sedation medicines used  during the test).    FOLLOW UP: Our staff will call the number listed on your records the next business day following your procedure to check on you and address any questions or concerns that you may have regarding the information given to you following your procedure. If we do not reach you, we will leave a message.  However, if you are feeling well and you are not experiencing any problems, there is no need to return our call.  We will assume that you have returned to your regular daily activities without incident.  If any biopsies were taken you will be contacted by phone or by letter within the next 1-3 weeks.  Please call us at [phone] if you have not heard about the biopsies in 3 weeks.    SIGNATURES/CONFIDENTIALITY: You and/or your care partner have signed paperwork which will be entered into your electronic medical record.  These signatures attest to the fact that that the information above on your After Visit Summary has been reviewed and is understood.  Full responsibility of the confidentiality of this discharge information lies with you and/or your care-partner. 

## 2019-01-03 NOTE — Progress Notes (Signed)
Called to room to assist during endoscopic procedure.  Patient ID and intended procedure confirmed with present staff. Received instructions for my participation in the procedure from the performing physician.  

## 2019-01-06 ENCOUNTER — Telehealth: Payer: Self-pay | Admitting: *Deleted

## 2019-01-06 ENCOUNTER — Telehealth: Payer: Self-pay

## 2019-01-06 NOTE — Telephone Encounter (Signed)
  Follow up Call-  Call back number 01/03/2019 11/27/2018 06/05/2018 04/23/2018 03/08/2018 10/31/2017 07/24/2017  Post procedure Call Back phone  # [phone]  Permission to leave phone message Yes Yes Yes Yes Yes Yes Yes  Some recent data might be hidden     Patient questions:  Do you have a fever, pain , or abdominal swelling? No. Pain Score  0 *  Have you tolerated food without any problems? Yes.    Have you been able to return to your normal activities? Yes.    Do you have any questions about your discharge instructions: Diet   No. Medications  No. Follow up visit  No.  Do you have questions or concerns about your Care? No.  Actions: * If pain score is 4 or above: No action needed, pain <4.

## 2019-01-06 NOTE — Telephone Encounter (Signed)
First follow up call attempt.  Unable to reach patient.

## 2019-01-23 ENCOUNTER — Telehealth: Payer: Self-pay

## 2019-01-23 NOTE — Telephone Encounter (Signed)
Called patient to move appointment back to 4/23. He understood and had no problem with rescheduling.

## 2019-02-04 ENCOUNTER — Encounter: Payer: Self-pay | Admitting: Gastroenterology

## 2019-02-04 NOTE — Telephone Encounter (Signed)
lab called with critical hemoglobin 7.8 dr Elease Hashimoto notified

## 2019-02-05 ENCOUNTER — Telehealth: Payer: Self-pay | Admitting: *Deleted

## 2019-02-05 NOTE — Telephone Encounter (Signed)
Covid-19 travel screening questions  Have you traveled in the last 14 days? no If yes where?  Do you now or have you had a fever in the last 14 days? no  Do you have any respiratory symptoms of shortness of breath or cough now or in the last 14 days? no  Do you have any family members or close contacts with diagnosed or suspected Covid-19? No  Pt is aware that care partner will be asked to stay in car during his procedure

## 2019-02-05 NOTE — Telephone Encounter (Signed)
Pt returned your call, pls call him again. °

## 2019-02-05 NOTE — Telephone Encounter (Signed)
Attempted to reach to do COVID screening questions.  LMOM

## 2019-02-06 ENCOUNTER — Other Ambulatory Visit: Payer: Self-pay

## 2019-02-06 ENCOUNTER — Ambulatory Visit (AMBULATORY_SURGERY_CENTER): Payer: Self-pay | Admitting: Gastroenterology

## 2019-02-06 ENCOUNTER — Encounter: Payer: Self-pay | Admitting: Gastroenterology

## 2019-02-06 VITALS — BP 105/72 | HR 69 | Temp 98.6°F | Resp 10 | Ht 72.0 in | Wt 184.0 lb

## 2019-02-06 DIAGNOSIS — K2270 Barrett's esophagus without dysplasia: Secondary | ICD-10-CM

## 2019-02-06 DIAGNOSIS — K2100 Gastro-esophageal reflux disease with esophagitis, without bleeding: Secondary | ICD-10-CM

## 2019-02-06 DIAGNOSIS — K2210 Ulcer of esophagus without bleeding: Secondary | ICD-10-CM

## 2019-02-06 DIAGNOSIS — R1310 Dysphagia, unspecified: Secondary | ICD-10-CM

## 2019-02-06 DIAGNOSIS — K449 Diaphragmatic hernia without obstruction or gangrene: Secondary | ICD-10-CM

## 2019-02-06 DIAGNOSIS — R1319 Other dysphagia: Secondary | ICD-10-CM

## 2019-02-06 DIAGNOSIS — K222 Esophageal obstruction: Secondary | ICD-10-CM

## 2019-02-06 MED ORDER — SODIUM CHLORIDE 0.9 % IV SOLN: 500.0000 mL | Freq: Once | INTRAVENOUS | Status: DC

## 2019-02-06 NOTE — Progress Notes (Signed)
Pt's states no medical or surgical changes since previsit or office visit. 

## 2019-02-06 NOTE — Op Note (Addendum)
[place] Patient Name: Eric Hood Procedure Date: 02/06/2019 7:41 AM MRN: [id number] Endoscopist: Ladene Artist , MD Age: 38 Referring MD:  Date of Birth: 04/06/1981 Gender: Male Account #: [id number] Procedure:                Upper GI endoscopy Indications:              Dysphagia, For therapy of esophageal stricture Medicines:                Monitored Anesthesia Care Procedure:                Pre-Anesthesia Assessment:                           - Prior to the procedure, a History and Physical                            was performed, and patient medications and                            allergies were reviewed. The patient's tolerance of                            previous anesthesia was also reviewed. The risks                            and benefits of the procedure and the sedation                            options and risks were discussed with the patient.                            All questions were answered, and informed consent                            was obtained. Prior Anticoagulants: The patient has                            taken no previous anticoagulant or antiplatelet                            agents. ASA Grade Assessment: II - A patient with                            mild systemic disease. After reviewing the risks                            and benefits, the patient was deemed in                            satisfactory condition to undergo the procedure.                           After obtaining informed consent, the endoscope was  passed under direct vision. Throughout the                            procedure, the patient's blood pressure, pulse, and                            oxygen saturations were monitored continuously. The                            Model GIF-HQ190 [id number] scope was introduced                            through the mouth, and advanced to the duodenal                            bulb. The  upper GI endoscopy was accomplished                            without difficulty. The patient tolerated the                            procedure well. Scope In: Scope Out: Findings:                 There were esophageal mucosal changes secondary to                            established long-segment Barrett's disease present                            in the mid esophagus and in the distal esophagus.                            The maximum longitudinal extent of these mucosal                            changes was 10 cm in length, from 28 cm to 38 cm.                           Three cratered (1 located at 35 cm) and superficial                            (2 located at 33 cm) esophageal ulcers with no                            bleeding and no stigmata of recent bleeding were                            found 33 to 35 cm from the incisors. The largest                            lesion was 10 mm in largest dimension. Biopsies  were taken with a cold forceps for histology.                           One benign-appearing, intrinsic moderate stenosis                            was found 26 cm from the incisors. This stenosis                            measured 1 cm (inner diameter). The stenosis was                            traversed. A guidewire was placed and the scope was                            withdrawn. Dilation was performed with a Savary                            dilator with no resistance at 11 mm. Dilations were                            performed with Savary dilators with mild resistance                            at 12 mm, 13 mm and 14 mm. Mild heme on all 4                            dilators.                           The exam of the esophagus was otherwise normal.                           A medium-sized hiatal hernia was present.                           The exam of the stomach was otherwise normal.                           The duodenal bulb and  second portion of the                            duodenum were normal. Complications:            No immediate complications. Estimated Blood Loss:     Estimated blood loss was minimal. Impression:               - Esophageal mucosal changes secondary to                            established long-segment Barrett's disease.                           - Non-bleeding esophageal ulcers. Biopsied.                           -  Benign-appearing esophageal stenosis. Dilated.                           - Medium-sized hiatal hernia.                           - Normal duodenal bulb and second portion of the                            duodenum. Recommendation:           - Patient has a contact number available for                            emergencies. The signs and symptoms of potential                            delayed complications were discussed with the                            patient. Return to normal activities tomorrow.                            Written discharge instructions were provided to the                            patient.                           - Clear liquid diet for 2 hours, then advance as                            tolerated to soft diet today.                           - Repeat upper endoscopy in 1 month to evaluate the                            response to therapy, repeat dilation.                           - Continue pantoprazole 40 mg po bid ac and                            famotidine 40 mg po hs.                           - Follow all antireflux measures long term.                           - Await pathology results. Ladene Artist, MD 02/06/2019 8:07:26 AM This report has been signed electronically.

## 2019-02-06 NOTE — Patient Instructions (Signed)
Handouts Provided:  Dilation Diet  YOU HAD AN ENDOSCOPIC PROCEDURE TODAY AT [facility]:   Refer to the procedure report that was given to you for any specific questions about what was found during the examination.  If the procedure report does not answer your questions, please call your gastroenterologist to clarify.  If you requested that your care partner not be given the details of your procedure findings, then the procedure report has been included in a sealed envelope for you to review at your convenience later.  YOU SHOULD EXPECT: Some feelings of bloating in the abdomen. Passage of more gas than usual.  Walking can help get rid of the air that was put into your GI tract during the procedure and reduce the bloating. If you had a lower endoscopy (such as a colonoscopy or flexible sigmoidoscopy) you may notice spotting of blood in your stool or on the toilet paper. If you underwent a bowel prep for your procedure, you may not have a normal bowel movement for a few days.  Please Note:  You might notice some irritation and congestion in your nose or some drainage.  This is from the oxygen used during your procedure.  There is no need for concern and it should clear up in a day or so.  SYMPTOMS TO REPORT IMMEDIATELY:   Following upper endoscopy (EGD)  Vomiting of blood or coffee ground material  New chest pain or pain under the shoulder blades  Painful or persistently difficult swallowing  New shortness of breath  Fever of 100F or higher  Black, tarry-looking stools  For urgent or emergent issues, a gastroenterologist can be reached at any hour by calling [phone].   DIET:  We do recommend a small meal at first, but then you may proceed to your regular diet.  Drink plenty of fluids but you should avoid alcoholic beverages for 24 hours.  ACTIVITY:  You should plan to take it easy for the rest of today and you should NOT DRIVE or use heavy machinery until tomorrow  (because of the sedation medicines used during the test).    FOLLOW UP: Our staff will call the number listed on your records the next business day following your procedure to check on you and address any questions or concerns that you may have regarding the information given to you following your procedure. If we do not reach you, we will leave a message.  However, if you are feeling well and you are not experiencing any problems, there is no need to return our call.  We will assume that you have returned to your regular daily activities without incident.  If any biopsies were taken you will be contacted by phone or by letter within the next 1-3 weeks.  Please call us at [phone] if you have not heard about the biopsies in 3 weeks.    SIGNATURES/CONFIDENTIALITY: You and/or your care partner have signed paperwork which will be entered into your electronic medical record.  These signatures attest to the fact that that the information above on your After Visit Summary has been reviewed and is understood.  Full responsibility of the confidentiality of this discharge information lies with you and/or your care-partner.

## 2019-02-06 NOTE — Progress Notes (Signed)
Called to room to assist during endoscopic procedure.  Patient ID and intended procedure confirmed with present staff. Received instructions for my participation in the procedure from the performing physician.  

## 2019-02-06 NOTE — Progress Notes (Signed)
Report given to PACU, vss 

## 2019-02-07 ENCOUNTER — Telehealth: Payer: Self-pay | Admitting: *Deleted

## 2019-02-07 NOTE — Telephone Encounter (Signed)
  Follow up Call-  Call back number 02/06/2019 01/03/2019 11/27/2018 06/05/2018 04/23/2018 03/08/2018 10/31/2017  Post procedure Call Back phone  # [phone]  Permission to leave phone message Yes Yes Yes Yes Yes Yes Yes  Some recent data might be hidden     Patient questions:  Do you have a fever, pain , or abdominal swelling? no Pain Score 0  Have you tolerated food without any problems? yes  Have you been able to return to your normal activities? yes  Do you have any questions about your discharge instructions: Diet   no Medications  no Follow up visit  Yes- repeat EGD and PV made for 1 month  Do you have questions or concerns about your Care? no  Actions: * If pain score is 4 or above: No action needed

## 2019-02-12 ENCOUNTER — Encounter: Payer: Self-pay | Admitting: Gastroenterology

## 2019-03-03 ENCOUNTER — Other Ambulatory Visit: Payer: Self-pay

## 2019-03-03 ENCOUNTER — Ambulatory Visit: Payer: Self-pay

## 2019-03-03 VITALS — Ht 72.0 in | Wt 180.0 lb

## 2019-03-03 DIAGNOSIS — D500 Iron deficiency anemia secondary to blood loss (chronic): Secondary | ICD-10-CM

## 2019-03-03 DIAGNOSIS — R1319 Other dysphagia: Secondary | ICD-10-CM

## 2019-03-03 DIAGNOSIS — R1310 Dysphagia, unspecified: Secondary | ICD-10-CM

## 2019-03-03 NOTE — Progress Notes (Signed)
Per pt, no allergies to soy or egg products.Pt not taking any weight loss meds or using  O2 at home. Pt denies any sedation problems.  Pt refused emmi.  The PV was done over the phone due to COVID-19. I verfied patient's address and he was still  self pay-no insurance at this time.  I reviewed prep instructions and medical history with pt. I will mail the pt's his paperwork today and he will call if he has any questions. Pt understood.

## 2019-03-09 ENCOUNTER — Telehealth: Payer: Self-pay | Admitting: *Deleted

## 2019-03-09 ENCOUNTER — Encounter: Payer: Self-pay | Admitting: *Deleted

## 2019-03-09 NOTE — Telephone Encounter (Signed)
Attempted to call patient to complete pre procedure Covid-19 screening. No answer. LMOM.

## 2019-03-09 NOTE — Telephone Encounter (Signed)
Erroneous encounter

## 2019-03-09 NOTE — Telephone Encounter (Signed)
Covid-19 travel screening questions  Have you traveled in the last 14 days? No If yes where?  Do you now or have you had a fever in the last 14 days? No  Do you have any respiratory symptoms of shortness of breath or cough now or in the last 14 days? No  Do you have any family members or close contacts with diagnosed or suspected Covid-19? No       

## 2019-03-09 NOTE — Telephone Encounter (Signed)
Pt is aware care partner to wait in car in parking lot. He does not have a mask, he is aware he will be given a paper mask when he arrives for appointment.

## 2019-03-11 ENCOUNTER — Other Ambulatory Visit: Payer: Self-pay

## 2019-03-11 ENCOUNTER — Ambulatory Visit (AMBULATORY_SURGERY_CENTER): Payer: Self-pay | Admitting: Gastroenterology

## 2019-03-11 ENCOUNTER — Encounter: Payer: Self-pay | Admitting: Gastroenterology

## 2019-03-11 VITALS — BP 109/66 | HR 80 | Temp 99.0°F | Resp 14 | Wt 180.0 lb

## 2019-03-11 DIAGNOSIS — K222 Esophageal obstruction: Secondary | ICD-10-CM

## 2019-03-11 DIAGNOSIS — K219 Gastro-esophageal reflux disease without esophagitis: Secondary | ICD-10-CM

## 2019-03-11 DIAGNOSIS — K259 Gastric ulcer, unspecified as acute or chronic, without hemorrhage or perforation: Secondary | ICD-10-CM

## 2019-03-11 DIAGNOSIS — R1319 Other dysphagia: Secondary | ICD-10-CM

## 2019-03-11 DIAGNOSIS — R1310 Dysphagia, unspecified: Secondary | ICD-10-CM

## 2019-03-11 MED ORDER — SODIUM CHLORIDE 0.9 % IV SOLN: 500.0000 mL | Freq: Once | INTRAVENOUS | Status: DC

## 2019-03-11 NOTE — Patient Instructions (Signed)
Clear liquid diet today until 10:15 this morning.  Soft diet the rest of today.  Resume regular diet tomorrow.  Continue present medications, including Pantoprazole 40mg  by mouth twice a day and Famotidine 40mg  by mouth at bedtime.  Repeat upper endoscopy in 1 month and repeat dilation.YOU HAD AN ENDOSCOPIC PROCEDURE TODAY AT [facility]:   Refer to the procedure report that was given to you for any specific questions about what was found during the examination.  If the procedure report does not answer your questions, please call your gastroenterologist to clarify.  If you requested that your care partner not be given the details of your procedure findings, then the procedure report has been included in a sealed envelope for you to review at your convenience later.  YOU SHOULD EXPECT: Some feelings of bloating in the abdomen. Passage of more gas than usual.  Walking can help get rid of the air that was put into your GI tract during the procedure and reduce the bloating. If you had a lower endoscopy (such as a colonoscopy or flexible sigmoidoscopy) you may notice spotting of blood in your stool or on the toilet paper. If you underwent a bowel prep for your procedure, you may not have a normal bowel movement for a few days.  Please Note:  You might notice some irritation and congestion in your nose or some drainage.  This is from the oxygen used during your procedure.  There is no need for concern and it should clear up in a day or so.  SYMPTOMS TO REPORT IMMEDIATELY:   Following lower endoscopy (colonoscopy or flexible sigmoidoscopy):  Excessive amounts of blood in the stool  Significant tenderness or worsening of abdominal pains  Swelling of the abdomen that is new, acute  Fever of 100F or higher   Following upper endoscopy (EGD)  Vomiting of blood or coffee ground material  New chest pain or pain under the shoulder blades  Painful or persistently difficult swallowing  New  shortness of breath  Fever of 100F or higher  Black, tarry-looking stools  For urgent or emergent issues, a gastroenterologist can be reached at any hour by calling [phone].   DIET:  We do recommend a small meal at first, but then you may proceed to your regular diet.  Drink plenty of fluids but you should avoid alcoholic beverages for 24 hours.  ACTIVITY:  You should plan to take it easy for the rest of today and you should NOT DRIVE or use heavy machinery until tomorrow (because of the sedation medicines used during the test).    FOLLOW UP: Our staff will call the number listed on your records 48-72 hours following your procedure to check on you and address any questions or concerns that you may have regarding the information given to you following your procedure. If we do not reach you, we will leave a message.  We will attempt to reach you two times.  During this call, we will ask if you have developed any symptoms of COVID 19. If you develop any symptoms (ie: fever, flu-like symptoms, shortness of breath, cough etc.) before then, please call [phone].  If you test positive for Covid 19 in the 2 weeks post procedure, please call and report this information to [place].    If any biopsies were taken you will be contacted by phone or by letter within the next 1-3 weeks.  Please call us at [phone] if you have not heard about  the biopsies in 3 weeks.    SIGNATURES/CONFIDENTIALITY: You and/or your care partner have signed paperwork which will be entered into your electronic medical record.  These signatures attest to the fact that that the information above on your After Visit Summary has been reviewed and is understood.  Full responsibility of the confidentiality of this discharge information lies with you and/or your care-partner.YOU HAD AN ENDOSCOPIC PROCEDURE TODAY AT [facility]:   Refer to the procedure report that was given to you for any specific questions  about what was found during the examination.  If the procedure report does not answer your questions, please call your gastroenterologist to clarify.  If you requested that your care partner not be given the details of your procedure findings, then the procedure report has been included in a sealed envelope for you to review at your convenience later.  YOU SHOULD EXPECT: Some feelings of bloating in the abdomen. Passage of more gas than usual.  Walking can help get rid of the air that was put into your GI tract during the procedure and reduce the bloating. If you had a lower endoscopy (such as a colonoscopy or flexible sigmoidoscopy) you may notice spotting of blood in your stool or on the toilet paper. If you underwent a bowel prep for your procedure, you may not have a normal bowel movement for a few days.  Please Note:  You might notice some irritation and congestion in your nose or some drainage.  This is from the oxygen used during your procedure.  There is no need for concern and it should clear up in a day or so.  SYMPTOMS TO REPORT IMMEDIATELY:    Following upper endoscopy (EGD)  Vomiting of blood or coffee ground material  New chest pain or pain under the shoulder blades  Painful or persistently difficult swallowing  New shortness of breath  Fever of 100F or higher  Black, tarry-looking stools  For urgent or emergent issues, a gastroenterologist can be reached at any hour by calling [phone].   DIET:  We do recommend a small meal at first, but then you may proceed to your regular diet.  Drink plenty of fluids but you should avoid alcoholic beverages for 24 hours.  ACTIVITY:  You should plan to take it easy for the rest of today and you should NOT DRIVE or use heavy machinery until tomorrow (because of the sedation medicines used during the test).    FOLLOW UP: Our staff will call the number listed on your records 48-72 hours following your procedure to check on you and  address any questions or concerns that you may have regarding the information given to you following your procedure. If we do not reach you, we will leave a message.  We will attempt to reach you two times.  During this call, we will ask if you have developed any symptoms of COVID 19. If you develop any symptoms (ie: fever, flu-like symptoms, shortness of breath, cough etc.) before then, please call [phone].  If you test positive for Covid 19 in the 2 weeks post procedure, please call and report this information to [place].    If any biopsies were taken you will be contacted by phone or by letter within the next 1-3 weeks.  Please call us at [phone] if you have not heard about the biopsies in 3 weeks.    SIGNATURES/CONFIDENTIALITY: You and/or your care partner have signed paperwork which will be entered into  your electronic medical record.  These signatures attest to the fact that that the information above on your After Visit Summary has been reviewed and is understood.  Full responsibility of the confidentiality of this discharge information lies with you and/or your care-partner.

## 2019-03-11 NOTE — Op Note (Signed)
[facility] Patient Name: Eric Hood Procedure Date: 03/11/2019 7:45 AM MRN: [id number] Endoscopist: Ladene Artist , MD Age: 38 Referring MD:  Date of Birth: [birth year] Gender: Male Account #: [id number] Procedure:                Upper GI endoscopy Indications:              Therapeutic procedure for stricture dilation,                            Dysphagia Medicines:                Monitored Anesthesia Care Procedure:                Pre-Anesthesia Assessment:                           - Prior to the procedure, a History and Physical                            was performed, and patient medications and                            allergies were reviewed. The patient's tolerance of                            previous anesthesia was also reviewed. The risks                            and benefits of the procedure and the sedation                            options and risks were discussed with the patient.                            All questions were answered, and informed consent                            was obtained. Prior Anticoagulants: The patient has                            taken no previous anticoagulant or antiplatelet                            agents. ASA Grade Assessment: II - A patient with                            mild systemic disease. After reviewing the risks                            and benefits, the patient was deemed in                            satisfactory condition to undergo the procedure.  After obtaining informed consent, the endoscope was                            passed under direct vision. Throughout the                            procedure, the patient's blood pressure, pulse, and                            oxygen saturations were monitored continuously. The                            Model GIF-HQ190 [id number]) scope was introduced                            through the mouth, and advanced to the second part                             of duodenum. The upper GI endoscopy was                            accomplished without difficulty. The patient                            tolerated the procedure well. Scope In: Scope Out: Findings:                 One benign-appearing, intrinsic moderate stenosis                            was found 26 cm from the incisors. This stenosis                            measured 1 cm (inner diameter). The stenosis was                            traversed. A guidewire was placed and the scope was                            withdrawn. Dilations were performed with Savary                            dilators with mild resistance at 11 mm, 12 mm, 13                            mm and 14 mm. Small amount of heme on each dilator.                           There were esophageal mucosal changes secondary to                            established long-segment Barrett's disease present  in the mid esophagus and in the distal esophagus.                            The maximum longitudinal extent of these mucosal                            changes was 10 cm in length.                           LA Grade C (one or more mucosal breaks continuous                            between tops of 2 or more mucosal folds, less than                            75% circumference) esophagitis with no bleeding was                            found in the mid esophagus.                           Three cratered and superficial esophageal ulcers                            with no bleeding and no stigmata of recent bleeding                            were found 36 cm, 33 cm, 32 cm from the incisors.                            The largest lesion was 10 mm in largest dimension.                           The exam of the esophagus was otherwise normal.                           A medium-sized hiatal hernia was present.                           The exam of the stomach was otherwise  normal.                           The duodenal bulb and second portion of the                            duodenum were normal. Complications:            No immediate complications. Estimated Blood Loss:     Estimated blood loss was minimal. Impression:               - Benign-appearing esophageal stenosis. Dilated.                           - Esophageal mucosal changes secondary to  established long-segment Barrett's disease.                           - LA Grade C reflux esophagitis.                           - Non-bleeding esophageal ulcers.                           - Medium-sized hiatal hernia.                           - Normal duodenal bulb and second portion of the                            duodenum.                           - No specimens collected. Recommendation:           - Patient has a contact number available for                            emergencies. The signs and symptoms of potential                            delayed complications were discussed with the                            patient. Return to normal activities tomorrow.                            Written discharge instructions were provided to the                            patient.                           - Clear liquid diet for 2 hours, then advance as                            tolerated to soft diet today.                           - Follow antireflux measures.                           - Continue present medications including                            pantoprazole 40 mg po bid and famotidine 40 mg po                            hs.                           - Repeat upper endoscopy in 1 month to evaluate the  response to therapy, repeat dilation. Ladene Artist, MD 03/11/2019 8:09:56 AM This report has been signed electronically.

## 2019-03-11 NOTE — Progress Notes (Signed)
Called to room to assist during endoscopic procedure.  Patient ID and intended procedure confirmed with present staff. Received instructions for my participation in the procedure from the performing physician.  

## 2019-03-11 NOTE — Progress Notes (Signed)
Courtney Washington , CMA- Temp Judy Branson, CMA- [facility]  

## 2019-03-11 NOTE — Progress Notes (Signed)
Pt's states no medical or surgical changes since previsit or office visit. 

## 2019-03-11 NOTE — Progress Notes (Signed)
Report given to PACU, vss 

## 2019-03-13 ENCOUNTER — Telehealth: Payer: Self-pay

## 2019-03-13 NOTE — Telephone Encounter (Signed)
1. Have you developed a fever since your procedure? no  2.   Have you had an respiratory symptoms (SOB or cough) since your procedure? no  3.   Have you tested positive for COVID 19 since your procedure no  4.   Have you had any family members/close contacts diagnosed with the COVID 19 since your procedure?  no   If any of these questions are a yes, please inquire if patient has been seen by family doctor and route this note to Joylene John, Therapist, sports. Follow up Call-  Call back number 03/11/2019 02/06/2019 01/03/2019 11/27/2018 06/05/2018 04/23/2018 03/08/2018  Post procedure Call Back phone  # [phone]  Permission to leave phone message Yes Yes Yes Yes Yes Yes Yes  Some recent data might be hidden     Patient questions:  Do you have a fever, pain , or abdominal swelling? No. Pain Score  0 *  Have you tolerated food without any problems? Yes.    Have you been able to return to your normal activities? Yes.    Do you have any questions about your discharge instructions: Diet   No. Medications  No. Follow up visit  No.  Do you have questions or concerns about your Care? No.  Actions: * If pain score is 4 or above: No action needed, pain <4.

## 2019-03-25 ENCOUNTER — Ambulatory Visit (AMBULATORY_SURGERY_CENTER): Payer: Self-pay

## 2019-03-25 ENCOUNTER — Other Ambulatory Visit: Payer: Self-pay

## 2019-03-25 VITALS — Ht 72.0 in | Wt 170.0 lb

## 2019-03-25 DIAGNOSIS — R1310 Dysphagia, unspecified: Secondary | ICD-10-CM

## 2019-03-25 NOTE — Progress Notes (Signed)
Denies allergies to eggs or soy products. Denies complication of anesthesia or sedation. Denies use of weight loss medication. Denies use of O2.   Emmi instructions given for endoscopy.  Pre-Visit was conducted by phone due to Covid 19. Instructions were reviewed with patient and mailed to confirmed home address. Patient was encouraged to call if he had questions or concerns regarding instructions.

## 2019-04-07 ENCOUNTER — Telehealth: Payer: Self-pay | Admitting: Gastroenterology

## 2019-04-07 NOTE — Telephone Encounter (Signed)

## 2019-04-07 NOTE — Telephone Encounter (Signed)
Left message for pt to cb and answer Covid-19 questions

## 2019-04-08 ENCOUNTER — Ambulatory Visit (AMBULATORY_SURGERY_CENTER): Payer: Self-pay | Admitting: Gastroenterology

## 2019-04-08 ENCOUNTER — Encounter: Payer: Self-pay | Admitting: Gastroenterology

## 2019-04-08 ENCOUNTER — Other Ambulatory Visit: Payer: Self-pay

## 2019-04-08 VITALS — BP 102/64 | HR 80 | Temp 98.7°F | Resp 9 | Ht 72.0 in | Wt 184.0 lb

## 2019-04-08 DIAGNOSIS — K2270 Barrett's esophagus without dysplasia: Secondary | ICD-10-CM

## 2019-04-08 DIAGNOSIS — K449 Diaphragmatic hernia without obstruction or gangrene: Secondary | ICD-10-CM

## 2019-04-08 DIAGNOSIS — R1319 Other dysphagia: Secondary | ICD-10-CM

## 2019-04-08 DIAGNOSIS — K209 Esophagitis, unspecified: Secondary | ICD-10-CM

## 2019-04-08 DIAGNOSIS — K222 Esophageal obstruction: Secondary | ICD-10-CM

## 2019-04-08 DIAGNOSIS — R1310 Dysphagia, unspecified: Secondary | ICD-10-CM

## 2019-04-08 MED ORDER — SODIUM CHLORIDE 0.9 % IV SOLN: 500.0000 mL | Freq: Once | INTRAVENOUS | Status: DC

## 2019-04-08 MED ORDER — PROMETHAZINE HCL 12.5 MG PO TABS: 12.5000 mg | ORAL_TABLET | Freq: Three times a day (TID) | ORAL | 2 refills | Status: DC | PRN

## 2019-04-08 NOTE — Progress Notes (Signed)
Report to PACU, RN, vss, BBS= Clear.  

## 2019-04-08 NOTE — Patient Instructions (Signed)
Clear liquid diet for 2 hours, then advance as tolerated to soft diet. Resume prior diet tomorrow. Follow anti-reflux measures. *see handout Return to GI office in 6 weeks.  *nurse from 3rd floor will call you to schedule. Phenergan prescription sent to pharmacy  It's 12.5mg  by mouth three times daily as needed 60 pills with 2 refills     YOU HAD AN ENDOSCOPIC PROCEDURE TODAY AT [facility]:   Refer to the procedure report that was given to you for any specific questions about what was found during the examination.  If the procedure report does not answer your questions, please call your gastroenterologist to clarify.  If you requested that your care partner not be given the details of your procedure findings, then the procedure report has been included in a sealed envelope for you to review at your convenience later.  YOU SHOULD EXPECT: Some feelings of bloating in the abdomen. Passage of more gas than usual.  Walking can help get rid of the air that was put into your GI tract during the procedure and reduce the bloating. If you had a lower endoscopy (such as a colonoscopy or flexible sigmoidoscopy) you may notice spotting of blood in your stool or on the toilet paper. If you underwent a bowel prep for your procedure, you may not have a normal bowel movement for a few days.  Please Note:  You might notice some irritation and congestion in your nose or some drainage.  This is from the oxygen used during your procedure.  There is no need for concern and it should clear up in a day or so.  SYMPTOMS TO REPORT IMMEDIATELY:   Following upper endoscopy (EGD)  Vomiting of blood or coffee ground material  New chest pain or pain under the shoulder blades  Painful or persistently difficult swallowing  New shortness of breath  Fever of 100F or higher  Black, tarry-looking stools  For urgent or emergent issues, a gastroenterologist can be reached at any hour by calling [phone]  [phone].   DIET:  We do recommend a small meal at first, but then you may proceed to your regular diet.  Drink plenty of fluids but you should avoid alcoholic beverages for 24 hours.  ACTIVITY:  You should plan to take it easy for the rest of today and you should NOT DRIVE or use heavy machinery until tomorrow (because of the sedation medicines used during the test).    FOLLOW UP: Our staff will call the number listed on your records 48-72 hours following your procedure to check on you and address any questions or concerns that you may have regarding the information given to you following your procedure. If we do not reach you, we will leave a message.  We will attempt to reach you two times.  During this call, we will ask if you have developed any symptoms of COVID 19. If you develop any symptoms (ie: fever, flu-like symptoms, shortness of breath, cough etc.) before then, please call [phone].  If you test positive for Covid 19 in the 2 weeks post procedure, please call and report this information to [place].    If any biopsies were taken you will be contacted by phone or by letter within the next 1-3 weeks.  Please call us at [phone] if you have not heard about the biopsies in 3 weeks.    SIGNATURES/CONFIDENTIALITY: You and/or your care partner have signed paperwork which will be entered into your electronic medical  record.  These signatures attest to the fact that that the information above on your After Visit Summary has been reviewed and is understood.  Full responsibility of the confidentiality of this discharge information lies with you and/or your care-partner. 

## 2019-04-08 NOTE — Progress Notes (Signed)
Called to room to assist during endoscopic procedure.  Patient ID and intended procedure confirmed with present staff. Received instructions for my participation in the procedure from the performing physician.  

## 2019-04-08 NOTE — Progress Notes (Signed)
Pt. Reports no change in his medical or surgical history since his pre-visit 03/25/2019.  Temp and VS checked by [place] and [facility].

## 2019-04-08 NOTE — Op Note (Signed)
[facility] Patient Name: Eric Hood Procedure Date: 04/08/2019 9:09 AM MRN: [id number] Endoscopist: Ladene Artist , MD Age: 38 Referring MD:  Date of Birth: 13-Dec-1980 Gender: Male Account #: [id number] Procedure:                Upper GI endoscopy Indications:              Therapeutic procedure for dilation of esophageal                            stricture, Dysphagia Medicines:                Monitored Anesthesia Care Procedure:                Pre-Anesthesia Assessment:                           - Prior to the procedure, a History and Physical                            was performed, and patient medications and                            allergies were reviewed. The patient's tolerance of                            previous anesthesia was also reviewed. The risks                            and benefits of the procedure and the sedation                            options and risks were discussed with the patient.                            All questions were answered, and informed consent                            was obtained. Prior Anticoagulants: The patient has                            taken no previous anticoagulant or antiplatelet                            agents. ASA Grade Assessment: II - A patient with                            mild systemic disease. After reviewing the risks                            and benefits, the patient was deemed in                            satisfactory condition to undergo the procedure.  After obtaining informed consent, the endoscope was                            passed under direct vision. Throughout the                            procedure, the patient's blood pressure, pulse, and                            oxygen saturations were monitored continuously. The                            Endoscope was introduced through the mouth, and                            advanced to the second part of duodenum.  The upper                            GI endoscopy was accomplished without difficulty.                            The patient tolerated the procedure well. Scope In: Scope Out: Findings:                 One benign-appearing, intrinsic moderate stenosis                            was found 26 cm from the incisors. This stenosis                            measured 1.1 cm (inner diameter). The stenosis was                            traversed. A guidewire was placed and the scope was                            withdrawn. Dilations were performed with Savary                            dilators with no resistance at 12 mm adn 13 mm.                            Small amount of heme on both dilations Dilations                            were performed with Savary dilators with mild                            resistance at 14 mm adn 15 mm. Small amount of                            blood on both dilators.  There were esophageal mucosal changes secondary to                            established long-segment Barrett's disease present                            in the distal esophagus. The maximum longitudinal                            extent of these mucosal changes was 10 cm in length.                           Three cratered esophageal ulcers with no bleeding                            and no stigmata of recent bleeding were found 36 cm                            33 cm and 32 cm from the incisors. The largest                            lesion was 10 mm in largest dimension at 36 cm.                           The exam of the esophagus was otherwise normal.                           A medium-sized hiatal hernia was present.                           The exam of the stomach was otherwise normal.                           The duodenal bulb and second portion of the                            duodenum were normal. Complications:            No immediate  complications. Estimated Blood Loss:     Estimated blood loss was minimal. Impression:               - Benign-appearing esophageal stenosis. Dilated.                           - Esophageal mucosal changes secondary to                            established long-segment Barrett's disease.                           - Non-bleeding esophageal ulcers.                           - Medium-sized hiatal hernia.                           -  Normal duodenal bulb and second portion of the                            duodenum.                           - No specimens collected. Recommendation:           - Patient has a contact number available for                            emergencies. The signs and symptoms of potential                            delayed complications were discussed with the                            patient. Return to normal activities tomorrow.                            Written discharge instructions were provided to the                            patient.                           - Clear liquid diet for 2 hours, then advance as                            tolerated to soft diet.                           - Resume prior diet tomorrow.                           - Follow antireflux measures.                           - Continue present medications.                           - Return to GI office in 6 weeks.                           - Phenergan (promethazine) 12.5 mg PO TID PRN, #60,                            2 refills. Ladene Artist, MD 04/08/2019 9:33:11 AM This report has been signed electronically.

## 2019-04-09 ENCOUNTER — Other Ambulatory Visit: Payer: Self-pay | Admitting: Gastroenterology

## 2019-04-09 ENCOUNTER — Other Ambulatory Visit: Payer: Self-pay | Admitting: Family Medicine

## 2019-04-10 ENCOUNTER — Telehealth: Payer: Self-pay

## 2019-04-10 NOTE — Telephone Encounter (Signed)
Last seen 11/19/2018. Does patient need to follow up for refills?

## 2019-04-10 NOTE — Telephone Encounter (Signed)
  Follow up Call-  Call back number 04/08/2019 03/11/2019 02/06/2019 01/03/2019 11/27/2018 06/05/2018 04/23/2018  Post procedure Call Back phone  # [phone]  Permission to leave phone message Yes Yes Yes Yes Yes Yes Yes  Some recent data might be hidden     Patient questions:  Do you have a fever, pain , or abdominal swelling? No. Pain Score  0 *  Have you tolerated food without any problems? Yes.    Have you been able to return to your normal activities? Yes.    Do you have any questions about your discharge instructions: Diet   No. Medications  No. Follow up visit  No.  Do you have questions or concerns about your Care? No.  Actions: * If pain score is 4 or above: No action needed, pain <4.  1. Have you developed a fever since your procedure? no  2.   Have you had an respiratory symptoms (SOB or cough) since your procedure? no  3.   Have you tested positive for COVID 19 since your procedure no  4.   Have you had any family members/close contacts diagnosed with the COVID 19 since your procedure?  no   If yes to any of these questions please route to Joylene John, RN and Alphonsa Gin, Therapist, sports.

## 2019-04-11 NOTE — Telephone Encounter (Signed)
Refill once 

## 2019-05-14 ENCOUNTER — Telehealth: Payer: Self-pay

## 2019-05-14 NOTE — Telephone Encounter (Signed)
Covid-19 screening questions   Do you now or have you had a fever in the last 14 days? No  Do you have any respiratory symptoms of shortness of breath or cough now or in the last 14 days? No  Do you have any family members or close contacts with diagnosed or suspected Covid-19 in the past 14 days? No  Have you been tested for Covid-19 and found to be positive? No        

## 2019-05-15 ENCOUNTER — Ambulatory Visit (INDEPENDENT_AMBULATORY_CARE_PROVIDER_SITE_OTHER): Payer: Self-pay | Admitting: Gastroenterology

## 2019-05-15 ENCOUNTER — Encounter: Payer: Self-pay | Admitting: Gastroenterology

## 2019-05-15 VITALS — BP 110/74 | HR 84 | Temp 98.0°F | Ht 71.0 in | Wt 177.1 lb

## 2019-05-15 DIAGNOSIS — K2100 Gastro-esophageal reflux disease with esophagitis, without bleeding: Secondary | ICD-10-CM

## 2019-05-15 DIAGNOSIS — K222 Esophageal obstruction: Secondary | ICD-10-CM

## 2019-05-15 DIAGNOSIS — K2270 Barrett's esophagus without dysplasia: Secondary | ICD-10-CM

## 2019-05-15 NOTE — Patient Instructions (Signed)
Continue your pantoprazole and pepcid at current dose.   Please schedule an appointment with your primary care physician to get your breathing problems evaluated.   Thank you for choosing me and [facility].  Pricilla Riffle. Dagoberto Ligas., MD., [facility]

## 2019-05-15 NOTE — Progress Notes (Signed)
    History of Present Illness: This is a 38 year old male with esophagitis, long segment Barrett's esophagus and recurrent esophageal strictures. Last EGD, Savary dilation in June to 15 mm. He relates no difficulty swallowing and rare episodes of breakthrough reflux symptoms.  He also relates an episode where he developed shortness of breath while lying in bed at night.  He states this happened previously several months ago.  It was not associated with regurgitation reflux or any GI symptoms.  He was considering going to the ED however the symptoms resolved.  Shortness of breath was not associated with chest pain, abdominal pain, fevers, cough, chills.  Current Medications, Allergies, Past Medical History, Past Surgical History, Family History and Social History were reviewed in [employer] record.   Physical Exam: General: Well developed, well nourished, no acute distress Head: Normocephalic and atraumatic Eyes:  sclerae anicteric, EOMI Ears: Normal auditory acuity Mouth: No deformity or lesions Lungs: Clear throughout to auscultation Heart: Regular rate and rhythm; no murmurs, rubs or bruits Abdomen: Soft, non tender and non distended. No masses, hepatosplenomegaly or hernias noted. Normal Bowel sounds Rectal: Not done Musculoskeletal: Symmetrical with no gross deformities  Pulses:  Normal pulses noted Extremities: No clubbing, cyanosis, edema or deformities noted Neurological: Alert oriented x 4, grossly nonfocal Psychological:  Alert and cooperative. Normal mood and affect   Assessment and Recommendations:  1. GERD with esophagitis, recurrent esophageal stricture, long segment Barrett's without dysplasia. Continue pantoprazole 40 mg po bid and famotidine 20 mg hs. Closely follow antireflux measures. Call if GERD not well controlled or dysphagia recurs. REV in 3 months.   2. Episodes of dyspnea. Follow up with PCP.

## 2019-06-11 ENCOUNTER — Other Ambulatory Visit: Payer: Self-pay

## 2019-06-11 ENCOUNTER — Emergency Department (HOSPITAL_BASED_OUTPATIENT_CLINIC_OR_DEPARTMENT_OTHER): Payer: Self-pay

## 2019-06-11 ENCOUNTER — Encounter (HOSPITAL_BASED_OUTPATIENT_CLINIC_OR_DEPARTMENT_OTHER): Payer: Self-pay

## 2019-06-11 ENCOUNTER — Emergency Department (HOSPITAL_BASED_OUTPATIENT_CLINIC_OR_DEPARTMENT_OTHER)
Admission: EM | Admit: 2019-06-11 | Discharge: 2019-06-12 | Disposition: A | Discharge status: Home / Self Care | Payer: Self-pay | Attending: Emergency Medicine | Admitting: Emergency Medicine

## 2019-06-11 DIAGNOSIS — Z79899 Other long term (current) drug therapy: Secondary | ICD-10-CM | POA: Insufficient documentation

## 2019-06-11 DIAGNOSIS — T403X5A Adverse effect of methadone, initial encounter: Secondary | ICD-10-CM | POA: Insufficient documentation

## 2019-06-11 DIAGNOSIS — R062 Wheezing: Secondary | ICD-10-CM | POA: Insufficient documentation

## 2019-06-11 DIAGNOSIS — K5903 Drug induced constipation: Secondary | ICD-10-CM | POA: Insufficient documentation

## 2019-06-11 LAB — CBC WITH DIFFERENTIAL/PLATELET
Abs Immature Granulocytes: 0.03 10*3/uL (ref 0.00–0.07)
Basophils Absolute: 0 10*3/uL (ref 0.0–0.1)
Basophils Relative: 0 %
Eosinophils Absolute: 0.8 10*3/uL — ABNORMAL HIGH (ref 0.0–0.5)
Eosinophils Relative: 11 %
HCT: 39.7 % (ref 39.0–52.0)
Hemoglobin: 12.4 g/dL — ABNORMAL LOW (ref 13.0–17.0)
Immature Granulocytes: 0 %
Lymphocytes Relative: 29 %
Lymphs Abs: 2.1 10*3/uL (ref 0.7–4.0)
MCH: 27.7 pg (ref 26.0–34.0)
MCHC: 31.2 g/dL (ref 30.0–36.0)
MCV: 88.8 fL (ref 80.0–100.0)
Monocytes Absolute: 0.5 10*3/uL (ref 0.1–1.0)
Monocytes Relative: 7 %
Neutro Abs: 3.7 10*3/uL (ref 1.7–7.7)
Neutrophils Relative %: 53 %
Platelets: 323 10*3/uL (ref 150–400)
RBC: 4.47 MIL/uL (ref 4.22–5.81)
RDW: 12.8 % (ref 11.5–15.5)
WBC: 7.1 10*3/uL (ref 4.0–10.5)
nRBC: 0 % (ref 0.0–0.2)

## 2019-06-11 LAB — COMPREHENSIVE METABOLIC PANEL
ALT: 18 U/L (ref 0–44)
AST: 38 U/L (ref 15–41)
Albumin: 3.3 g/dL — ABNORMAL LOW (ref 3.5–5.0)
Alkaline Phosphatase: 92 U/L (ref 38–126)
Anion gap: 8 (ref 5–15)
BUN: 10 mg/dL (ref 6–20)
CO2: 25 mmol/L (ref 22–32)
Calcium: 8.5 mg/dL — ABNORMAL LOW (ref 8.9–10.3)
Chloride: 103 mmol/L (ref 98–111)
Creatinine, Ser: 0.86 mg/dL (ref 0.61–1.24)
GFR calc Af Amer: >60 mL/min (ref >60)
GFR calc non Af Amer: >60 mL/min (ref >60)
Glucose, Bld: 86 mg/dL (ref 70–99)
Potassium: 4 mmol/L (ref 3.5–5.1)
Sodium: 136 mmol/L (ref 135–145)
Total Bilirubin: 0.5 mg/dL (ref 0.3–1.2)
Total Protein: 6.5 g/dL (ref 6.5–8.1)

## 2019-06-11 LAB — LIPASE, BLOOD: Lipase: 23 U/L (ref 11–51)

## 2019-06-11 MED ORDER — ALBUTEROL SULFATE HFA 108 (90 BASE) MCG/ACT IN AERS
8.0000 | INHALATION_SPRAY | Freq: Once | RESPIRATORY_TRACT | Status: AC
  Administered 2019-06-11: 8 via RESPIRATORY_TRACT
  Filled 2019-06-11: qty 6.7

## 2019-06-11 MED ORDER — KETOROLAC TROMETHAMINE 30 MG/ML IJ SOLN
15.0000 mg | Freq: Once | INTRAMUSCULAR | Status: AC
  Administered 2019-06-11: 15 mg via INTRAVENOUS
  Filled 2019-06-11: qty 1

## 2019-06-11 NOTE — ED Triage Notes (Signed)
Pt c/o upper abd pain x 2 months-points to bilat lower rib area-denies v/d-NAD-steady gait

## 2019-06-12 ENCOUNTER — Encounter (HOSPITAL_BASED_OUTPATIENT_CLINIC_OR_DEPARTMENT_OTHER): Payer: Self-pay | Admitting: Emergency Medicine

## 2019-06-12 MED ORDER — ACETAMINOPHEN 500 MG PO TABS
ORAL_TABLET | ORAL | Status: AC
  Filled 2019-06-12: qty 2

## 2019-06-12 MED ORDER — ACETAMINOPHEN 500 MG PO TABS
1000.0000 mg | ORAL_TABLET | Freq: Once | ORAL | Status: AC
  Administered 2019-06-12: 1000 mg via ORAL
  Filled 2019-06-12: qty 2

## 2019-06-12 MED ORDER — DOCUSATE SODIUM 100 MG PO CAPS
100.0000 mg | ORAL_CAPSULE | Freq: Once | ORAL | Status: AC
  Administered 2019-06-12: 100 mg via ORAL
  Filled 2019-06-12: qty 1

## 2019-06-12 MED ORDER — POLYETHYLENE GLYCOL 3350 17 G PO PACK: 17.0000 g | PACK | Freq: Two times a day (BID) | ORAL | 0 refills | Status: DC

## 2019-06-12 MED ORDER — DOCUSATE SODIUM 100 MG PO CAPS
ORAL_CAPSULE | ORAL | Status: AC
  Filled 2019-06-12: qty 2

## 2019-06-12 NOTE — ED Provider Notes (Signed)
[facility] EMERGENCY DEPARTMENT Provider Note   CSN: [id number] Arrival date & time: 06/11/19  2215     History   Chief Complaint Chief Complaint  Patient presents with  . Abdominal Pain    HPI Eric Hood is a 38 y.o. male.     The history is provided by the patient.  Abdominal Pain Pain location:  Generalized Pain quality: aching   Pain radiates to:  Does not radiate Pain severity:  Moderate Onset quality:  Gradual Duration:  12 weeks Timing:  Constant Progression:  Unchanged Chronicity:  Chronic Context: not alcohol use and not awakening from sleep   Context comment:  On methadon for h/o substance abuse not having daily BM but eats on schedule Relieved by:  Nothing Worsened by:  Nothing Ineffective treatments:  None tried Associated symptoms: constipation   Associated symptoms: no anorexia, no belching, no chest pain, no chills, no cough, no diarrhea, no dysuria, no fatigue, no fever, no flatus, no hematemesis, no hematochezia, no hematuria, no melena, no nausea, no shortness of breath, no sore throat, no vaginal bleeding, no vaginal discharge and no vomiting   Risk factors: no alcohol abuse   Cannot tell me when or how often he has BM, is on methadone.  No f/c/r.  No urinary symptoms.  No n/v/d. No cough.     Past Medical History:  Diagnosis Date  . Anemia 10/23/2014  . Anxiety   . Back pain   . Blood transfusion without reported diagnosis Raisa Ditto 2016/feb 2020  . Esophageal stricture 03/2014  . Esophageal ulcer   . GERD (gastroesophageal reflux disease)   . Hiatal hernia   . Reflux   . Substance abuse ([place])    on methadone  . Ulcerative esophagitis 03/2014   severe    Patient Active Problem List   Diagnosis Date Noted  . 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 Fever 11/19/2018  . Acute on chronic anemia 11/19/2018  . Dysphagia   . Thoracic compression fracture ([place]) 10/14/2015  . Trigger point of thoracic region 09/03/2015  . Acute  lung injury   . Acute respiratory failure with hypoxia ([place])   . Respiratory distress   . Respiratory failure ([place]) 05/15/2015  . Acute respiratory failure with hypoxemia ([place])   . Aspiration pneumonia ([place])   . Acute encephalopathy   . Narcotic overdose ([place])   . Loss of weight 02/10/2015  . Scaphoid fracture of wrist 01/20/2015  . Acute esophagitis   . Esophageal stricture   . Food impaction of esophagus   . Acute right lower quadrant pain 11/13/2014  . Anemia 10/23/2014  . Heme positive stool 10/23/2014  . GERD (gastroesophageal reflux disease) 10/23/2014  . Right-sided chest/RUQ pain 10/23/2014  . History of esophagitis and stricture 10/23/2014  . Anxiety state, unspecified 06/15/2014  . Scapular dysfunction 03/29/2014  . Nonallopathic lesion of thoracic region 03/29/2014  . Nonallopathic lesion of cervical region 03/29/2014  . Nonallopathic lesion-rib cage 03/29/2014  . Nonallopathic lesion of sacral region 03/29/2014  . Nonallopathic lesion of lumbosacral region 03/29/2014  . Dysplastic nevus of trunk 03/07/2013  . Thoracic back pain 02/11/2013    Past Surgical History:  Procedure Laterality Date  . BALLOON DILATION N/A 05/20/2015   Procedure: BALLOON DILATION;  Surgeon: Milus Banister, MD;  Location: [facility];  Service: Endoscopy;  Laterality: N/A;  . COLONOSCOPY    . ESOPHAGEAL DILATION     On 3 occasions: 03/2014, 08/2014, 09/2014  . ESOPHAGOGASTRODUODENOSCOPY N/A 05/20/2015  Procedure: ESOPHAGOGASTRODUODENOSCOPY (EGD);  Surgeon: Milus Banister, MD;  Location: [facility];  Service: Endoscopy;  Laterality: N/A;  . ESOPHAGOGASTRODUODENOSCOPY (EGD) WITH PROPOFOL N/A 01/18/2015   Procedure: ESOPHAGOGASTRODUODENOSCOPY (EGD) WITH PROPOFOL;  Surgeon: Lafayette Dragon, MD;  Location: [facility];  Service: Endoscopy;  Laterality: N/A;  . ESOPHAGOGASTRODUODENOSCOPY (EGD) WITH PROPOFOL N/A 09/04/2017   Procedure: ESOPHAGOGASTRODUODENOSCOPY (EGD) WITH PROPOFOL;  Surgeon:  Ladene Artist, MD;  Location: [facility];  Service: Endoscopy;  Laterality: N/A;  need fluro  . ESOPHAGOGASTRODUODENOSCOPY (EGD) WITH PROPOFOL N/A 10/01/2017   Procedure: ESOPHAGOGASTRODUODENOSCOPY (EGD) WITH PROPOFOL;  Surgeon: Ladene Artist, MD;  Location: [facility];  Service: Endoscopy;  Laterality: N/A;  . HAND SURGERY Right   . SAVORY DILATION N/A 09/04/2017   Procedure: SAVORY DILATION;  Surgeon: Ladene Artist, MD;  Location: [facility];  Service: Endoscopy;  Laterality: N/A;  . SAVORY DILATION N/A 10/01/2017   Procedure: SAVORY DILATION;  Surgeon: Ladene Artist, MD;  Location: [facility];  Service: Endoscopy;  Laterality: N/A;  . UPPER GASTROINTESTINAL ENDOSCOPY          Home Medications    Prior to Admission medications   Medication Sig Start Date End Date Taking? Authorizing Provider  famotidine (PEPCID) 20 MG tablet Take 20 mg by mouth at bedtime.    [provider]  hydrOXYzine (ATARAX/VISTARIL) 25 MG tablet TAKE 1 TO 2 TABLETS BY MOUTH EVERY NIGHT AT BEDTIME AS NEEDED FOR INSOMNIA 04/11/19   Burchette, Alinda Sierras, MD  methadone (DOLOPHINE) 10 MG/ML solution Take 45 mg by mouth daily.    [provider]  pantoprazole (PROTONIX) 40 MG tablet TAKE 1 TABLET(40 MG) BY MOUTH TWICE DAILY BEFORE A MEAL 04/10/19   Ladene Artist, MD  polyethylene glycol (MIRALAX) 17 g packet Take 17 g by mouth 2 (two) times daily. 06/12/19   Christina Gintz, MD  promethazine (PHENERGAN) 12.5 MG tablet Take 1 tablet (12.5 mg total) by mouth 3 (three) times daily as needed for nausea or vomiting. 04/08/19   Ladene Artist, MD    Family History Family History  Problem Relation Age of Onset  . Hypertension Mother   . Hypertension Father   . Colon cancer Neg Hx   . Esophageal cancer Neg Hx   . Rectal cancer Neg Hx   . Stomach cancer Neg Hx   . Colon polyps Neg Hx   . Liver cancer Neg Hx   . Pancreatic cancer Neg Hx     Social History Social History   Tobacco  Use  . Smoking status: Never Smoker  . Smokeless tobacco: Never Used  Substance Use Topics  . Alcohol use: Not Currently  . Drug use: Not Currently    Comment: currently taking Methadone     Allergies   Hydrocodone   Review of Systems Review of Systems  Constitutional: Negative for chills, fatigue and fever.  HENT: Negative for sore throat.   Eyes: Negative for visual disturbance.  Respiratory: Positive for wheezing. Negative for cough and shortness of breath.   Cardiovascular: Negative for chest pain.  Gastrointestinal: Positive for abdominal pain and constipation. Negative for anorexia, diarrhea, flatus, hematemesis, hematochezia, melena, nausea and vomiting.  Genitourinary: Negative for dysuria, hematuria, vaginal bleeding and vaginal discharge.  Musculoskeletal: Negative for arthralgias.  Neurological: Negative for dizziness.  Psychiatric/Behavioral: Negative for agitation.  All other systems reviewed and are negative.    Physical Exam Updated Vital Signs BP 128/84 (BP Location: Left Arm)   Pulse 83  Temp 98.8 F (37.1 C) (Oral)   Resp 16   Ht 6' (1.829 m)   Wt 83 kg   SpO2 97%   BMI 24.82 kg/m   Physical Exam Vitals signs and nursing note reviewed.  Constitutional:      Appearance: He is normal weight. He is not ill-appearing or diaphoretic.  HENT:     Head: Normocephalic and atraumatic.     Nose: Nose normal.  Eyes:     Conjunctiva/sclera: Conjunctivae normal.     Pupils: Pupils are equal, round, and reactive to light.  Neck:     Musculoskeletal: Normal range of motion and neck supple.  Cardiovascular:     Rate and Rhythm: Normal rate and regular rhythm.     Pulses: Normal pulses.     Heart sounds: Normal heart sounds.  Pulmonary:     Breath sounds: Wheezing present.  Abdominal:     General: Abdomen is flat. Bowel sounds are normal.     Tenderness: There is no abdominal tenderness. There is no guarding or rebound. Negative signs include Murphy's  sign, Rovsing's sign, McBurney's sign and psoas sign.     Comments: Stool palpable throughout the colon.    Musculoskeletal: Normal range of motion.  Skin:    General: Skin is warm and dry.     Capillary Refill: Capillary refill takes less than 2 seconds.  Neurological:     General: No focal deficit present.     Mental Status: He is alert and oriented to person, place, and time.  Psychiatric:        Mood and Affect: Mood normal.        Behavior: Behavior normal.      ED Treatments / Results  Labs (all labs ordered are listed, but only abnormal results are displayed) Results for orders placed or performed during the hospital encounter of 06/11/19  CBC with Differential  Result Value Ref Range   WBC 7.1 4.0 - 10.5 K/uL   RBC 4.47 4.22 - 5.81 MIL/uL   Hemoglobin 12.4 (L) 13.0 - 17.0 g/dL   HCT 39.7 39.0 - 52.0 %   MCV 88.8 80.0 - 100.0 fL   MCH 27.7 26.0 - 34.0 pg   MCHC 31.2 30.0 - 36.0 g/dL   RDW 12.8 11.5 - 15.5 %   Platelets 323 150 - 400 K/uL   nRBC 0.0 0.0 - 0.2 %   Neutrophils Relative % 53 %   Neutro Abs 3.7 1.7 - 7.7 K/uL   Lymphocytes Relative 29 %   Lymphs Abs 2.1 0.7 - 4.0 K/uL   Monocytes Relative 7 %   Monocytes Absolute 0.5 0.1 - 1.0 K/uL   Eosinophils Relative 11 %   Eosinophils Absolute 0.8 (H) 0.0 - 0.5 K/uL   Basophils Relative 0 %   Basophils Absolute 0.0 0.0 - 0.1 K/uL   Immature Granulocytes 0 %   Abs Immature Granulocytes 0.03 0.00 - 0.07 K/uL  Comprehensive metabolic panel  Result Value Ref Range   Sodium 136 135 - 145 mmol/L   Potassium 4.0 3.5 - 5.1 mmol/L   Chloride 103 98 - 111 mmol/L   CO2 25 22 - 32 mmol/L   Glucose, Bld 86 70 - 99 mg/dL   BUN 10 6 - 20 mg/dL   Creatinine, Ser 0.86 0.61 - 1.24 mg/dL   Calcium 8.5 (L) 8.9 - 10.3 mg/dL   Total Protein 6.5 6.5 - 8.1 g/dL   Albumin 3.3 (L) 3.5 - 5.0 g/dL  AST 38 15 - 41 U/L   ALT 18 0 - 44 U/L   Alkaline Phosphatase 92 38 - 126 U/L   Total Bilirubin 0.5 0.3 - 1.2 mg/dL   GFR calc non  Af Amer >60 >60 mL/min   GFR calc Af Amer >60 >60 mL/min   Anion gap 8 5 - 15  Lipase, blood  Result Value Ref Range   Lipase 23 11 - 51 U/L   Dg Abdomen Acute W/chest  Result Date: 06/11/2019 CLINICAL DATA:  Abdominal pain with constipation EXAM: DG ABDOMEN ACUTE W/ 1V CHEST COMPARISON:  Chest x-ray 11/24/2018 FINDINGS: Single-view chest demonstrates no acute airspace disease or effusion. Normal heart size. No pneumothorax. Supine and upright views of the abdomen demonstrate no free air beneath the diaphragm. Nonobstructed bowel-gas pattern with moderate to large stool in the colon. IMPRESSION: Negative abdominal radiographs. No acute cardiopulmonary disease. Moderate to large volume of stool in the colon. Electronically Signed   By: Donavan Foil M.D.   On: 06/11/2019 23:54    EKG EKG Interpretation  Date/Time:  Wednesday June 11 2019 22:41:18 EDT Ventricular Rate:  80 PR Interval:    QRS Duration: 87 QT Interval:  375 QTC Calculation: 433 R Axis:   51 Text Interpretation:  Sinus rhythm Confirmed by Randal Buba, Maiana Hennigan ([id number]) on 06/12/2019 12:05:13 AM   Radiology Dg Abdomen Acute W/chest  Result Date: 06/11/2019 CLINICAL DATA:  Abdominal pain with constipation EXAM: DG ABDOMEN ACUTE W/ 1V CHEST COMPARISON:  Chest x-ray 11/24/2018 FINDINGS: Single-view chest demonstrates no acute airspace disease or effusion. Normal heart size. No pneumothorax. Supine and upright views of the abdomen demonstrate no free air beneath the diaphragm. Nonobstructed bowel-gas pattern with moderate to large stool in the colon. IMPRESSION: Negative abdominal radiographs. No acute cardiopulmonary disease. Moderate to large volume of stool in the colon. Electronically Signed   By: Donavan Foil M.D.   On: 06/11/2019 23:54    Procedures Procedures (including critical care time)  Medications Ordered in ED Medications  ketorolac (TORADOL) 30 MG/ML injection 15 mg (15 mg Intravenous Given 06/11/19 2350)   albuterol (VENTOLIN HFA) 108 (90 Base) MCG/ACT inhaler 8 puff (8 puffs Inhalation Given 06/11/19 2346)  docusate sodium (COLACE) capsule 100 mg (100 mg Oral Given 06/12/19 0012)  acetaminophen (TYLENOL) tablet 1,000 mg (1,000 mg Oral Given 06/12/19 0012)     Breathing treatment given with resolution of symptoms.  Patient clinically and based on Xray has constipation.  No signs or surgical abdomen.  Pain is chronic.  Will treat with miralax BID for one week then daily thereafter.  This is almost certainly from the chronic narcotics.  Inhaler given with teaching.  Lungs clear post treatment.  CXR clear.  Eric Hood was evaluated in Emergency Department on 06/12/2019 for the symptoms described in the history of present illness. He was evaluated in the context of the global COVID-19 pandemic, which necessitated consideration that the patient might be at risk for infection with the SARS-CoV-2 virus that causes COVID-19. Institutional protocols and algorithms that pertain to the evaluation of patients at risk for COVID-19 are in a state of rapid change based on information released by regulatory bodies including the CDC and federal and state organizations. These policies and algorithms were followed during the patient's care in the ED.   Final Clinical Impressions(s) / ED Diagnoses   Final diagnoses:  Drug-induced constipation   Return for intractable cough, coughing up blood,fevers >100.4 unrelieved by medication, shortness of breath, intractable vomiting,  chest pain, shortness of breath, weakness,numbness, changes in speech, facial asymmetry,abdominal pain, passing out,Inability to tolerate liquids or food, cough, altered mental status or any concerns. No signs of systemic illness or infection. The patient is nontoxic-appearing on exam and vital signs are within normal limits.   I have reviewed the triage vital signs and the nursing notes. Pertinent labs &imaging results that were  available during my care of the patient were reviewed by me and considered in my medical decision making (see chart for details).  After history, exam, and medical workup I feel the patient has been appropriately medically screened and is safe for discharge home. Pertinent diagnoses were discussed with the patient. Patient was given return precautions ED Discharge Orders         Ordered    polyethylene glycol (MIRALAX) 17 g packet  2 times daily     06/12/19 0006           Aiyanna Awtrey, MD 06/12/19 0116

## 2019-09-16 ENCOUNTER — Telehealth: Payer: Self-pay | Admitting: Gastroenterology

## 2019-09-16 ENCOUNTER — Encounter: Payer: Self-pay | Admitting: Gastroenterology

## 2019-09-16 NOTE — Telephone Encounter (Signed)
Left message to call back. To schedule direct egd w. Kizzie Furnish.

## 2019-09-16 NOTE — Telephone Encounter (Signed)
OK for direct EGD / dilation

## 2019-09-16 NOTE — Telephone Encounter (Signed)
Okay for direct EGD dil in the [place]? Last one 03/2019

## 2019-09-16 NOTE — Telephone Encounter (Signed)
[facility] Plan Patient

## 2019-09-16 NOTE — Telephone Encounter (Signed)
Pt called wanting to schedule an egd w. Kizzie Furnish. He is not due until 2022 but he said that he has been having trouble swallowing again. Please advise if it is ok to schedule egd or if he needs an ov first.

## 2019-09-26 ENCOUNTER — Ambulatory Visit (AMBULATORY_SURGERY_CENTER): Payer: Self-pay | Admitting: *Deleted

## 2019-09-26 ENCOUNTER — Other Ambulatory Visit: Payer: Self-pay

## 2019-09-26 VITALS — Temp 97.8°F | Ht 71.0 in | Wt 188.0 lb

## 2019-09-26 DIAGNOSIS — Z1159 Encounter for screening for other viral diseases: Secondary | ICD-10-CM

## 2019-09-26 DIAGNOSIS — R1319 Other dysphagia: Secondary | ICD-10-CM

## 2019-09-26 DIAGNOSIS — R1310 Dysphagia, unspecified: Secondary | ICD-10-CM

## 2019-09-26 NOTE — Progress Notes (Signed)
Patient is here in-person for PV. Patient denies any allergies to eggs or soy. Patient denies any problems with anesthesia/sedation. Patient denies any oxygen use at home. Patient denies taking any diet/weight loss medications or blood thinners. Patient is not being treated for MRSA or C-diff. EMMI education assisgned to the patient for the procedure, this was explained and instructions given to patient. COVID-19 screening test is on 12/17, the pt is aware. Pt is aware that care partner will wait in the car during procedure; if they feel like they will be too hot or cold to wait in the car; they may wait in the 4 th floor lobby. Patient is aware to bring only one care partner. We want them to wear a mask (we do not have any that we can provide them), practice social distancing, and we will check their temperatures when they get here.  I did remind the patient that their care partner needs to stay in the parking lot the entire time and have a cell phone available, we will call them when the pt is ready for discharge. Patient will wear mask into building.

## 2019-10-02 ENCOUNTER — Ambulatory Visit (INDEPENDENT_AMBULATORY_CARE_PROVIDER_SITE_OTHER): Payer: Self-pay

## 2019-10-02 DIAGNOSIS — Z1159 Encounter for screening for other viral diseases: Secondary | ICD-10-CM

## 2019-10-02 LAB — SARS CORONAVIRUS 2 (TAT 6-24 HRS): SARS Coronavirus 2: NEGATIVE

## 2019-10-06 ENCOUNTER — Encounter: Payer: Self-pay | Admitting: Gastroenterology

## 2019-10-06 ENCOUNTER — Other Ambulatory Visit: Payer: Self-pay

## 2019-10-06 ENCOUNTER — Ambulatory Visit (AMBULATORY_SURGERY_CENTER): Payer: Self-pay | Admitting: Gastroenterology

## 2019-10-06 VITALS — BP 102/61 | HR 88 | Temp 99.2°F | Resp 11 | Ht 71.0 in | Wt 188.0 lb

## 2019-10-06 DIAGNOSIS — R1319 Other dysphagia: Secondary | ICD-10-CM

## 2019-10-06 DIAGNOSIS — K2210 Ulcer of esophagus without bleeding: Secondary | ICD-10-CM

## 2019-10-06 DIAGNOSIS — K259 Gastric ulcer, unspecified as acute or chronic, without hemorrhage or perforation: Secondary | ICD-10-CM

## 2019-10-06 DIAGNOSIS — K2270 Barrett's esophagus without dysplasia: Secondary | ICD-10-CM

## 2019-10-06 DIAGNOSIS — K222 Esophageal obstruction: Secondary | ICD-10-CM

## 2019-10-06 DIAGNOSIS — R1310 Dysphagia, unspecified: Secondary | ICD-10-CM

## 2019-10-06 MED ORDER — SODIUM CHLORIDE 0.9 % IV SOLN: 500.0000 mL | Freq: Once | INTRAVENOUS | Status: DC

## 2019-10-06 NOTE — Op Note (Signed)
[facility] Patient Name: Eric Hood Procedure Date: 10/06/2019 2:00 PM MRN: [id number] Endoscopist: Ladene Artist , MD Age: 38 Referring MD:  Date of Birth: [birth year] Gender: Male Account #: [id number] Procedure:                Upper GI endoscopy Indications:              Dysphagia Medicines:                Monitored Anesthesia Care Procedure:                Pre-Anesthesia Assessment:                           - Prior to the procedure, a History and Physical                            was performed, and patient medications and                            allergies were reviewed. The patient's tolerance of                            previous anesthesia was also reviewed. The risks                            and benefits of the procedure and the sedation                            options and risks were discussed with the patient.                            All questions were answered, and informed consent                            was obtained. Prior Anticoagulants: The patient has                            taken no previous anticoagulant or antiplatelet                            agents. ASA Grade Assessment: II - A patient with                            mild systemic disease. After reviewing the risks                            and benefits, the patient was deemed in                            satisfactory condition to undergo the procedure.                           After obtaining informed consent, the endoscope was  passed under direct vision. Throughout the                            procedure, the patient's blood pressure, pulse, and                            oxygen saturations were monitored continuously. The                            Endoscope was introduced through the mouth, and                            advanced to the third part of duodenum. The upper                            GI endoscopy was accomplished without difficulty.                             The patient tolerated the procedure well. Scope In: Scope Out: Findings:                 One benign-appearing, intrinsic moderate stenosis                            was found 26 cm from the incisors. This stenosis                            measured 1 cm (inner diameter) x less than one cm                            (in length). The stenosis was traversed after                            dilation. A guidewire was placed and the scope was                            withdrawn. Dilations were performed with Savary                            dilators with mild resistance at 12 mm, 13 mm and                            14 mm. The dilation site was examined following                            endoscope reinsertion and showed moderate mucosal                            disruption and moderate improvement in luminal                            narrowing. Scant heme on first 2 dilators and  moderate heme on last dilator.                           One cratered esophageal ulcer with no bleeding and                            no stigmata of recent bleeding was found 36 cm from                            the incisors. The lesion was 7 mm in largest                            dimension. Biopsies were taken with a cold forceps                            for histology.                           Three superficial esophageal ulcers with no                            bleeding and no stigmata of recent bleeding were                            found 29 cm to 34 cm from the incisors. The largest                            lesion was 7 mm in largest dimension. Biopsies were                            taken with a cold forceps for histology.                           There were esophageal mucosal changes secondary to                            established long-segment Barrett's disease present                            in the distal esophagus. The maximum  longitudinal                            extent of these mucosal changes was 10 cm in length                            from 26 cm to 36 cm.                           LA Grade C (one or more mucosal breaks continuous                            between tops of 2 or more mucosal folds, less than  75% circumference) esophagitis with bleeding was                            found in the mid esophagus.                           A medium-sized hiatal hernia was present.                           The exam of the stomach was otherwise normal.                           The duodenal bulb and second portion of the                            duodenum were normal. Complications:            No immediate complications. Estimated Blood Loss:     Estimated blood loss was minimal. Impression:               - Benign-appearing esophageal stenosis. Dilated.                           - Esophageal ulcer with no bleeding and no stigmata                            of recent bleeding. Biopsied.                           - Esophageal ulcers with no bleeding and no                            stigmata of recent bleeding. Biopsied.                           - Esophageal mucosal changes secondary to                            established long-segment Barrett's disease.                           - LA Grade C esophagitis.                           - Medium-sized hiatal hernia.                           - Normal duodenal bulb and second portion of the                            duodenum. Recommendation:           - Patient has a contact number available for                            emergencies. The signs and symptoms of potential  delayed complications were discussed with the                            patient. Return to normal activities tomorrow.                            Written discharge instructions were provided to the                            patient.                            - Clear liquid diet for 2 hours, then advance as                            tolerated to soft diet today.                           - Antireflux measures long term.                           - Continue present medications including                            pantoprazole 40 mg po bid and famotidine 20 mg po                            hs.                           - Await pathology results.                           - Repeat upper endoscopy with dilation, assess                            ulcer healing in 6-8 weeks. Ladene Artist, MD 10/06/2019 2:36:57 PM This report has been signed electronically.

## 2019-10-06 NOTE — Progress Notes (Signed)
Pt. Reports no change in his medical or surgical history since his pre-visit 09/26/2019.

## 2019-10-06 NOTE — Patient Instructions (Addendum)
SEE DYSPHAGIA DIET! CLEAR LIQUIDS FOR 2 HOURS THEN SOFT FOODS IF YOU CAN TOLERATE ANTIREFLUX MEASURES LONG TERM CONTINUE PRESENT MEDS INCLUDING PANTOPRAZOLE 40MG  BY MOUTH TWICE DAILY AND FAMOTIDINE 20MG  BY MOUTH AT BEDTIME.       YOU HAD AN ENDOSCOPIC PROCEDURE TODAY AT [facility]:   Refer to the procedure report that was given to you for any specific questions about what was found during the examination.  If the procedure report does not answer your questions, please call your gastroenterologist to clarify.  If you requested that your care partner not be given the details of your procedure findings, then the procedure report has been included in a sealed envelope for you to review at your convenience later.  YOU SHOULD EXPECT: Some feelings of bloating in the abdomen. Passage of more gas than usual.  Walking can help get rid of the air that was put into your GI tract during the procedure and reduce the bloating. If you had a lower endoscopy (such as a colonoscopy or flexible sigmoidoscopy) you may notice spotting of blood in your stool or on the toilet paper. If you underwent a bowel prep for your procedure, you may not have a normal bowel movement for a few days.  Please Note:  You might notice some irritation and congestion in your nose or some drainage.  This is from the oxygen used during your procedure.  There is no need for concern and it should clear up in a day or so.  SYMPTOMS TO REPORT IMMEDIATELY:    Following upper endoscopy (EGD)  Vomiting of blood or coffee ground material  New chest pain or pain under the shoulder blades  Painful or persistently difficult swallowing  New shortness of breath  Fever of 100F or higher  Black, tarry-looking stools  For urgent or emergent issues, a gastroenterologist can be reached at any hour by calling [phone].   DIET: PLEASE READ DYSPHAGIA DIET  ACTIVITY:  You should plan to take it easy for the rest of today  and you should NOT DRIVE or use heavy machinery until tomorrow (because of the sedation medicines used during the test).    FOLLOW UP: Our staff will call the number listed on your records 48-72 hours following your procedure to check on you and address any questions or concerns that you may have regarding the information given to you following your procedure. If we do not reach you, we will leave a message.  We will attempt to reach you two times.  During this call, we will ask if you have developed any symptoms of COVID 19. If you develop any symptoms (ie: fever, flu-like symptoms, shortness of breath, cough etc.) before then, please call [phone].  If you test positive for Covid 19 in the 2 weeks post procedure, please call and report this information to [place].    If any biopsies were taken you will be contacted by phone or by letter within the next 1-3 weeks.  Please call us at [phone] if you have not heard about the biopsies in 3 weeks.    SIGNATURES/CONFIDENTIALITY: You and/or your care partner have signed paperwork which will be entered into your electronic medical record.  These signatures attest to the fact that that the information above on your After Visit Summary has been reviewed and is understood.  Full responsibility of the confidentiality of this discharge information lies with you and/or your care-partner.

## 2019-10-06 NOTE — Progress Notes (Signed)
PT taken to PACU. Monitors in place. VSS. Report given to RN. 

## 2019-10-06 NOTE — Progress Notes (Signed)
Called to room to assist during endoscopic procedure.  Patient ID and intended procedure confirmed with present staff. Received instructions for my participation in the procedure from the performing physician.  

## 2019-10-07 ENCOUNTER — Other Ambulatory Visit: Payer: Self-pay

## 2019-10-08 ENCOUNTER — Encounter: Payer: Self-pay | Admitting: Family Medicine

## 2019-10-08 ENCOUNTER — Ambulatory Visit (INDEPENDENT_AMBULATORY_CARE_PROVIDER_SITE_OTHER): Payer: Self-pay | Admitting: Family Medicine

## 2019-10-08 ENCOUNTER — Telehealth: Payer: Self-pay | Admitting: *Deleted

## 2019-10-08 VITALS — BP 116/74 | HR 94 | Temp 98.0°F | Ht 71.0 in | Wt 182.6 lb

## 2019-10-08 DIAGNOSIS — F5104 Psychophysiologic insomnia: Secondary | ICD-10-CM

## 2019-10-08 DIAGNOSIS — K2210 Ulcer of esophagus without bleeding: Secondary | ICD-10-CM

## 2019-10-08 LAB — CBC WITH DIFFERENTIAL/PLATELET
Basophils Absolute: 0 10*3/uL (ref 0.0–0.1)
Basophils Relative: 0.7 % (ref 0.0–3.0)
Eosinophils Absolute: 0.6 10*3/uL (ref 0.0–0.7)
Eosinophils Relative: 9.1 % — ABNORMAL HIGH (ref 0.0–5.0)
HCT: 40.9 % (ref 39.0–52.0)
Hemoglobin: 13.1 g/dL (ref 13.0–17.0)
Lymphocytes Relative: 31.2 % (ref 12.0–46.0)
Lymphs Abs: 2.2 10*3/uL (ref 0.7–4.0)
MCHC: 32 g/dL (ref 30.0–36.0)
MCV: 79.1 fl (ref 78.0–100.0)
Monocytes Absolute: 0.4 10*3/uL (ref 0.1–1.0)
Monocytes Relative: 6.4 % (ref 3.0–12.0)
Neutro Abs: 3.7 10*3/uL (ref 1.4–7.7)
Neutrophils Relative %: 52.6 % (ref 43.0–77.0)
Platelets: 481 10*3/uL — ABNORMAL HIGH (ref 150.0–400.0)
RBC: 5.18 Mil/uL (ref 4.22–5.81)
RDW: 16.7 % — ABNORMAL HIGH (ref 11.5–15.5)
WBC: 7 10*3/uL (ref 4.0–10.5)

## 2019-10-08 MED ORDER — HYDROXYZINE HCL 25 MG PO TABS: ORAL_TABLET | ORAL | 0 refills | Status: DC

## 2019-10-08 NOTE — Progress Notes (Signed)
Subjective:     Patient ID: Eric Hood, male   DOB: October 08, 1981, 38 y.o.   MRN: [id number]  HPI Vyom is here basically requesting CBC.  He has a long history of esophagitis and Barrett's changes esophagus.  He underwent EGD on Monday and had esophageal ulcers.  He is on fairly aggressive therapy with Protonix 40 mg twice daily and takes Pepcid 20 mg at night.  He has had significant upper GI bleeds in the past.  Denies any hematemesis or melena.  No dizziness.  He has had some mild nausea.  He was having dysphagia prior to dilatation of esophagus on Monday but is been swallowing well since then.  No significant abdominal pain.  Avoids nonsteroidals.  No alcohol use.  Requesting hydroxyzine which he takes as needed for insomnia  Past Medical History:  Diagnosis Date  . Anemia 10/23/2014  . Anxiety   . Back pain   . Blood transfusion without reported diagnosis april 2016/feb 2020  . Esophageal stricture 03/2014  . Esophageal ulcer   . GERD (gastroesophageal reflux disease)   . Hiatal hernia   . Reflux   . 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 Ulcerative esophagitis 03/2014   severe   Past Surgical History:  Procedure Laterality Date  . BALLOON DILATION N/A 05/20/2015   Procedure: BALLOON DILATION;  Surgeon: Milus Banister, MD;  Location: [facility];  Service: Endoscopy;  Laterality: N/A;  . COLONOSCOPY    . ESOPHAGEAL DILATION     On 3 occasions: 03/2014, 08/2014, 09/2014  . ESOPHAGOGASTRODUODENOSCOPY N/A 05/20/2015   Procedure: ESOPHAGOGASTRODUODENOSCOPY (EGD);  Surgeon: Milus Banister, MD;  Location: [facility];  Service: Endoscopy;  Laterality: N/A;  . ESOPHAGOGASTRODUODENOSCOPY (EGD) WITH PROPOFOL N/A 01/18/2015   Procedure: ESOPHAGOGASTRODUODENOSCOPY (EGD) WITH PROPOFOL;  Surgeon: Lafayette Dragon, MD;  Location: [facility];  Service: Endoscopy;  Laterality: N/A;  . ESOPHAGOGASTRODUODENOSCOPY (EGD) WITH PROPOFOL N/A 09/04/2017   Procedure: ESOPHAGOGASTRODUODENOSCOPY (EGD)  WITH PROPOFOL;  Surgeon: Ladene Artist, MD;  Location: [facility];  Service: Endoscopy;  Laterality: N/A;  need fluro  . ESOPHAGOGASTRODUODENOSCOPY (EGD) WITH PROPOFOL N/A 10/01/2017   Procedure: ESOPHAGOGASTRODUODENOSCOPY (EGD) WITH PROPOFOL;  Surgeon: Ladene Artist, MD;  Location: [facility];  Service: Endoscopy;  Laterality: N/A;  . HAND SURGERY Right   . SAVORY DILATION N/A 09/04/2017   Procedure: SAVORY DILATION;  Surgeon: Ladene Artist, MD;  Location: [facility];  Service: Endoscopy;  Laterality: N/A;  . SAVORY DILATION N/A 10/01/2017   Procedure: SAVORY DILATION;  Surgeon: Ladene Artist, MD;  Location: [facility];  Service: Endoscopy;  Laterality: N/A;  . UPPER GASTROINTESTINAL ENDOSCOPY      reports that he has never smoked. He has never used smokeless tobacco. He reports previous alcohol use. He reports previous drug use. family history includes Hypertension in his father and mother. Allergies  Allergen Reactions  . Hydrocodone Nausea And Vomiting     Review of Systems  Constitutional: Negative for chills and fever.  HENT: Negative for trouble swallowing.   Respiratory: Negative for shortness of breath.   Cardiovascular: Negative for chest pain.  Gastrointestinal: Positive for nausea. Negative for abdominal distention, abdominal pain, blood in stool and vomiting.       Objective:   Physical Exam Vitals reviewed.  Constitutional:      Appearance: Normal appearance.  Cardiovascular:     Rate and Rhythm: Normal rate and regular rhythm.  Pulmonary:     Effort: Pulmonary effort is  normal.     Breath sounds: Normal breath sounds.  Abdominal:     Palpations: Abdomen is soft.     Tenderness: There is no abdominal tenderness.  Neurological:     Mental Status: He is alert.        Assessment:     #1 history of GERD with recent esophageal ulcers which were biopsied.  Patient requesting CBC.  He does not have any symptoms of active bleeding such as  hematemesis, melena, or dizziness  #2 chronic intermittent insomnia    Plan:     -Check CBC -Refill hydroxyzine for as needed use  Eulas Post MD [facility]

## 2019-10-08 NOTE — Telephone Encounter (Signed)
  Follow up Call-  Call back number 10/06/2019 04/08/2019 03/11/2019 02/06/2019 01/03/2019 11/27/2018 06/05/2018  Post procedure Call Back phone  # [phone] [id number] [phone] [id number] [phone]  Permission to leave phone message Yes Yes Yes Yes Yes Yes Yes  Some recent data might be hidden     Patient questions:  Do you have a fever, pain , or abdominal swelling? No. Pain Score  0 *  Have you tolerated food without any problems? Yes.    Have you been able to return to your normal activities? Yes.    Do you have any questions about your discharge instructions: Diet   No. Medications  No. Follow up visit  No.  Do you have questions or concerns about your Care? No.  Actions: * If pain score is 4 or above: No action needed, pain <4.  1. Have you developed a fever since your procedure? no  2.   Have you had an respiratory symptoms (SOB or cough) since your procedure? no  3.   Have you tested positive for COVID 19 since your procedure no  4.   Have you had any family members/close contacts diagnosed with the COVID 19 since your procedure?  no   If yes to any of these questions please route to Joylene John, RN and Alphonsa Gin, Therapist, sports.

## 2019-10-18 ENCOUNTER — Encounter: Payer: Self-pay | Admitting: Gastroenterology

## 2019-10-27 ENCOUNTER — Telehealth: Payer: Self-pay | Admitting: Gastroenterology

## 2019-10-27 NOTE — Telephone Encounter (Signed)
Yes he needs EGD and pre-visit 6-8 weeks post last procedure

## 2019-10-28 ENCOUNTER — Encounter: Payer: Self-pay | Admitting: Gastroenterology

## 2019-11-06 ENCOUNTER — Telehealth: Payer: Self-pay | Admitting: *Deleted

## 2019-11-06 NOTE — Telephone Encounter (Signed)
Pt no showed 8 am PV Called pt at 820 am, LM to return call by 5 pm to RS and if no call will have to RS both PV and EGD No call at 1710 pm- Canceled PV and EGD, mailed No show Letter

## 2019-11-14 ENCOUNTER — Other Ambulatory Visit: Payer: Self-pay

## 2019-11-14 ENCOUNTER — Ambulatory Visit (AMBULATORY_SURGERY_CENTER): Payer: Self-pay | Admitting: *Deleted

## 2019-11-14 VITALS — Temp 97.8°F | Ht 71.0 in | Wt 188.0 lb

## 2019-11-14 DIAGNOSIS — Z01818 Encounter for other preprocedural examination: Secondary | ICD-10-CM

## 2019-11-14 DIAGNOSIS — K2210 Ulcer of esophagus without bleeding: Secondary | ICD-10-CM

## 2019-11-14 NOTE — Progress Notes (Signed)
Patient is here in-person for PV. Patient denies any allergies to eggs or soy. Patient denies any problems with anesthesia/sedation. Patient denies any oxygen use at home. Patient denies taking any diet/weight loss medications or blood thinners. Patient is not being treated for MRSA or C-diff. Pt denies any changes in medical hx since last EGD.   COVID-19 screening test is on 2/8 at 10 am, the pt is aware. Pt is aware that care partner will wait in the car during procedure; if they feel like they will be too hot or cold to wait in the car; they may wait in the 4 th floor lobby. Patient is aware to bring only one care partner. We want them to wear a mask (we do not have any that we can provide them), practice social distancing, and we will check their temperatures when they get here.  I did remind the patient that their care partner needs to stay in the parking lot the entire time and have a cell phone available, we will call them when the pt is ready for discharge. Patient will wear mask into building.

## 2019-11-19 ENCOUNTER — Encounter: Payer: Self-pay | Admitting: Gastroenterology

## 2019-11-24 ENCOUNTER — Other Ambulatory Visit: Payer: Self-pay | Admitting: Family Medicine

## 2019-11-24 ENCOUNTER — Other Ambulatory Visit: Payer: Self-pay | Admitting: Gastroenterology

## 2019-11-24 ENCOUNTER — Ambulatory Visit (INDEPENDENT_AMBULATORY_CARE_PROVIDER_SITE_OTHER): Payer: Self-pay

## 2019-11-24 DIAGNOSIS — Z1159 Encounter for screening for other viral diseases: Secondary | ICD-10-CM

## 2019-11-25 LAB — SARS CORONAVIRUS 2 (TAT 6-24 HRS): SARS Coronavirus 2: NEGATIVE

## 2019-11-27 ENCOUNTER — Other Ambulatory Visit: Payer: Self-pay

## 2019-11-27 ENCOUNTER — Encounter: Payer: Self-pay | Admitting: Gastroenterology

## 2019-11-27 ENCOUNTER — Ambulatory Visit (AMBULATORY_SURGERY_CENTER): Payer: Self-pay | Admitting: Gastroenterology

## 2019-11-27 VITALS — BP 115/82 | HR 81 | Temp 97.5°F | Resp 10 | Ht 71.0 in | Wt 188.0 lb

## 2019-11-27 DIAGNOSIS — K2210 Ulcer of esophagus without bleeding: Secondary | ICD-10-CM

## 2019-11-27 DIAGNOSIS — K222 Esophageal obstruction: Secondary | ICD-10-CM

## 2019-11-27 DIAGNOSIS — R1310 Dysphagia, unspecified: Secondary | ICD-10-CM

## 2019-11-27 DIAGNOSIS — K449 Diaphragmatic hernia without obstruction or gangrene: Secondary | ICD-10-CM

## 2019-11-27 DIAGNOSIS — R1319 Other dysphagia: Secondary | ICD-10-CM

## 2019-11-27 HISTORY — PX: UPPER GASTROINTESTINAL ENDOSCOPY: SHX188

## 2019-11-27 MED ORDER — SODIUM CHLORIDE 0.9 % IV SOLN: 500.0000 mL | Freq: Once | INTRAVENOUS | Status: DC

## 2019-11-27 NOTE — Patient Instructions (Signed)
Discharge instructions given. Handout on a dilatation diet. Appointment scheduled for repeat Endoscopy with dilatation. Resume previous medications. YOU HAD AN ENDOSCOPIC PROCEDURE TODAY AT [facility]:   Refer to the procedure report that was given to you for any specific questions about what was found during the examination.  If the procedure report does not answer your questions, please call your gastroenterologist to clarify.  If you requested that your care partner not be given the details of your procedure findings, then the procedure report has been included in a sealed envelope for you to review at your convenience later.  YOU SHOULD EXPECT: Some feelings of bloating in the abdomen. Passage of more gas than usual.  Walking can help get rid of the air that was put into your GI tract during the procedure and reduce the bloating. If you had a lower endoscopy (such as a colonoscopy or flexible sigmoidoscopy) you may notice spotting of blood in your stool or on the toilet paper. If you underwent a bowel prep for your procedure, you may not have a normal bowel movement for a few days.  Please Note:  You might notice some irritation and congestion in your nose or some drainage.  This is from the oxygen used during your procedure.  There is no need for concern and it should clear up in a day or so.  SYMPTOMS TO REPORT IMMEDIATELY:   Following upper endoscopy (EGD)  Vomiting of blood or coffee ground material  New chest pain or pain under the shoulder blades  Painful or persistently difficult swallowing  New shortness of breath  Fever of 100F or higher  Black, tarry-looking stools  For urgent or emergent issues, a gastroenterologist can be reached at any hour by calling [phone].   DIET:  We do recommend a small meal at first, but then you may proceed to your regular diet.  Drink plenty of fluids but you should avoid alcoholic beverages for 24 hours.  ACTIVITY:  You  should plan to take it easy for the rest of today and you should NOT DRIVE or use heavy machinery until tomorrow (because of the sedation medicines used during the test).    FOLLOW UP: Our staff will call the number listed on your records 48-72 hours following your procedure to check on you and address any questions or concerns that you may have regarding the information given to you following your procedure. If we do not reach you, we will leave a message.  We will attempt to reach you two times.  During this call, we will ask if you have developed any symptoms of COVID 19. If you develop any symptoms (ie: fever, flu-like symptoms, shortness of breath, cough etc.) before then, please call [phone].  If you test positive for Covid 19 in the 2 weeks post procedure, please call and report this information to [place].    If any biopsies were taken you will be contacted by phone or by letter within the next 1-3 weeks.  Please call us at [phone] if you have not heard about the biopsies in 3 weeks.    SIGNATURES/CONFIDENTIALITY: You and/or your care partner have signed paperwork which will be entered into your electronic medical record.  These signatures attest to the fact that that the information above on your After Visit Summary has been reviewed and is understood.  Full responsibility of the confidentiality of this discharge information lies with you and/or your care-partner.

## 2019-11-27 NOTE — Progress Notes (Signed)
Called to room to assist during endoscopic procedure.  Patient ID and intended procedure confirmed with present staff. Received instructions for my participation in the procedure from the performing physician.  

## 2019-11-27 NOTE — Op Note (Signed)
[facility] Patient Name: Eric Hood Procedure Date: 11/27/2019 9:00 AM MRN: [id number] Endoscopist: Ladene Artist , MD Age: 39 Referring MD:  Date of Birth: [birth year] Gender: Male Account #: [id number] Procedure:                Upper GI endoscopy Indications:              Dysphagia, Esophageal stricture for dilation Medicines:                Monitored Anesthesia Care Procedure:                Pre-Anesthesia Assessment:                           - Prior to the procedure, a History and Physical                            was performed, and patient medications and                            allergies were reviewed. The patient's tolerance of                            previous anesthesia was also reviewed. The risks                            and benefits of the procedure and the sedation                            options and risks were discussed with the patient.                            All questions were answered, and informed consent                            was obtained. Prior Anticoagulants: The patient has                            taken no previous anticoagulant or antiplatelet                            agents. ASA Grade Assessment: II - A patient with                            mild systemic disease. After reviewing the risks                            and benefits, the patient was deemed in                            satisfactory condition to undergo the procedure.                           After obtaining informed consent, the endoscope was  passed under direct vision. Throughout the                            procedure, the patient's blood pressure, pulse, and                            oxygen saturations were monitored continuously. The                            Endoscope was introduced through the mouth, and                            advanced to the second part of duodenum. The upper                            GI endoscopy  was accomplished without difficulty.                            The patient tolerated the procedure well. Scope In: Scope Out: Findings:                 One benign-appearing, intrinsic moderate stenosis                            was found 26 cm from the incisors. This stenosis                            measured 9 mm (inner diameter) x less than one cm                            (in length). The stenosis was traversed after                            dilation. A guidewire was placed and the scope was                            withdrawn. Dilations were performed with Savary                            dilators with mild resistance at 11 mm, 12 mm, 13                            mm and 14 mm. The dilation site was examined                            following endoscope reinsertion and showed mild                            mucosal disruption and moderate improvement in                            luminal narrowing. Small amount of heme on each  dilation. Estimated blood loss was minimal.                           One cratered esophageal ulcer with no bleeding and                            no stigmata of recent bleeding was found 36 cm from                            the incisors. The lesion was 8 mm in largest                            dimension.                           Three superficial esophageal ulcers with no                            bleeding and no stigmata of recent bleeding were                            found 31 to 35 cm from the incisors. The largest                            lesion was 10 mm in largest dimension.                           There were esophageal mucosal changes secondary to                            established long-segment Barrett's disease present                            in the mid esophagus and in the distal esophagus.                            The maximum longitudinal extent of these mucosal                             changes was 10 cm in length.                           The exam of the esophagus was otherwise normal.                           A medium-sized hiatal hernia was present.                           The exam of the stomach was otherwise normal.                           The duodenal bulb and second portion of the  duodenum were normal. Complications:            No immediate complications. Estimated Blood Loss:     Estimated blood loss was minimal. Impression:               - Benign-appearing esophageal stenosis. Dilated.                           - Esophageal ulcer with no bleeding and no stigmata                            of recent bleeding.                           - Esophageal ulcers with no bleeding and no                            stigmata of recent bleeding.                           - Esophageal mucosal changes secondary to                            established long-segment Barrett's disease.                           - Medium-sized hiatal hernia.                           - Normal duodenal bulb and second portion of the                            duodenum.                           - No specimens collected. Recommendation:           - Patient has a contact number available for                            emergencies. The signs and symptoms of potential                            delayed complications were discussed with the                            patient. Return to normal activities tomorrow.                            Written discharge instructions were provided to the                            patient.                           - Clear liquid diet for 2 hours, then advance as  tolerated to soft diet today.                           - Resume prior diet tomorrow.                           - Antireflux measures long term.                           - Continue present medications including                             pantoprazole 40 mg po bid and famotidine 20 mg po                            hs.                           - Repeat upper endoscopy with dilation in 1 month                            for retreatment. Ladene Artist, MD 11/27/2019 9:34:05 AM This report has been signed electronically.

## 2019-11-27 NOTE — Progress Notes (Signed)
To PACU, VSS. Report to [email or web address] 

## 2019-11-27 NOTE — Progress Notes (Signed)
Temp by JB, VS by DT  Pt's states no medical or surgical changes since previsit or office visit. 

## 2019-12-01 ENCOUNTER — Telehealth: Payer: Self-pay | Admitting: *Deleted

## 2019-12-01 NOTE — Telephone Encounter (Signed)
  Follow up Call-  Call back number 11/27/2019 10/06/2019 04/08/2019 03/11/2019 02/06/2019 01/03/2019 11/27/2018  Post procedure Call Back phone  # [phone] [id number] [phone] [id number]  Permission to leave phone message Yes Yes Yes Yes Yes Yes Yes  Some recent data might be hidden     Patient questions:  Do you have a fever, pain , or abdominal swelling? No. Pain Score  0 *  Have you tolerated food without any problems? Yes.    Have you been able to return to your normal activities? Yes.    Do you have any questions about your discharge instructions: Diet   No. Medications  No. Follow up visit  No.  Do you have questions or concerns about your Care? No.  Actions: * If pain score is 4 or above: No action needed, pain <4.  1. Have you developed a fever since your procedure? no  2.   Have you had an respiratory symptoms (SOB or cough) since your procedure? no  3.   Have you tested positive for COVID 19 since your procedure no  4.   Have you had any family members/close contacts diagnosed with the COVID 19 since your procedure?  no   If yes to any of these questions please route to Joylene John, RN and Alphonsa Gin, Therapist, sports.

## 2019-12-13 ENCOUNTER — Encounter (HOSPITAL_COMMUNITY): Payer: Self-pay | Admitting: Emergency Medicine

## 2019-12-13 ENCOUNTER — Emergency Department (HOSPITAL_COMMUNITY)
Admission: EM | Admit: 2019-12-13 | Discharge: 2019-12-14 | Disposition: A | Discharge status: Home / Self Care | Payer: Self-pay | Attending: Emergency Medicine | Admitting: Emergency Medicine

## 2019-12-13 ENCOUNTER — Emergency Department (HOSPITAL_COMMUNITY): Payer: Self-pay

## 2019-12-13 ENCOUNTER — Other Ambulatory Visit: Payer: Self-pay

## 2019-12-13 DIAGNOSIS — J209 Acute bronchitis, unspecified: Secondary | ICD-10-CM | POA: Insufficient documentation

## 2019-12-13 DIAGNOSIS — R0689 Other abnormalities of breathing: Secondary | ICD-10-CM

## 2019-12-13 DIAGNOSIS — R05 Cough: Secondary | ICD-10-CM | POA: Insufficient documentation

## 2019-12-13 DIAGNOSIS — R0789 Other chest pain: Secondary | ICD-10-CM | POA: Insufficient documentation

## 2019-12-13 DIAGNOSIS — R0602 Shortness of breath: Secondary | ICD-10-CM

## 2019-12-13 DIAGNOSIS — Z20822 Contact with and (suspected) exposure to covid-19: Secondary | ICD-10-CM | POA: Insufficient documentation

## 2019-12-13 HISTORY — DX: Other abnormalities of breathing: R06.89

## 2019-12-13 MED ORDER — DEXAMETHASONE SODIUM PHOSPHATE 10 MG/ML IJ SOLN
10.0000 mg | Freq: Once | INTRAMUSCULAR | Status: AC
  Administered 2019-12-14: 10 mg via INTRAVENOUS
  Filled 2019-12-13: qty 1

## 2019-12-13 MED ORDER — ALBUTEROL SULFATE HFA 108 (90 BASE) MCG/ACT IN AERS
8.0000 | INHALATION_SPRAY | Freq: Once | RESPIRATORY_TRACT | Status: AC
  Administered 2019-12-14: 8 via RESPIRATORY_TRACT
  Filled 2019-12-13: qty 6.7

## 2019-12-13 NOTE — ED Provider Notes (Signed)
[facility] DEPT Provider Note: Georgena Spurling, MD, FACEP  CSN: [id number] MRN: [id number] ARRIVAL: 12/13/19 at [place]: WA12/WA12   CHIEF COMPLAINT  Shortness of Breath   HISTORY OF PRESENT ILLNESS  12/13/19 11:15 PM Eric Hood is a 39 y.o. male without a significant history of lung disease.  He is here with several days of mild chest congestion and productive cough.  He denies fever, body aches, nasal congestion, sore throat, nausea, vomiting or diarrhea.  He is here with about 2 hours of shortness of breath that came on fairly suddenly.  He was noted to be tachypneic and hypoxic to 86% on RA on arrival with improvement on oxygen by nasal cannula.  He has associated frontal chest discomfort which she describes as a tightness or a comfort and rates it as a 10 out of 10.  This discomfort radiates to his left scapula.   He has chronic back pain but states that has not changed nor is it why he is here.  He is on methadone maintenance for opioid addiction.   Past Medical History:  Diagnosis Date  . Anemia 10/23/2014  . Anxiety   . Back pain   . Blood transfusion without reported diagnosis april 2016/feb 2020  . Esophageal stricture 03/2014  . Esophageal ulcer   . GERD (gastroesophageal reflux disease)   . Hiatal hernia   . Reflux   . Substance abuse ([place])    on methadone  . Ulcerative esophagitis 03/2014   severe    Past Surgical History:  Procedure Laterality Date  . BALLOON DILATION N/A 05/20/2015   Procedure: BALLOON DILATION;  Surgeon: Milus Banister, MD;  Location: [facility];  Service: Endoscopy;  Laterality: N/A;  . COLONOSCOPY    . ESOPHAGEAL DILATION     On 3 occasions: 03/2014, 08/2014, 09/2014  . ESOPHAGOGASTRODUODENOSCOPY N/A 05/20/2015   Procedure: ESOPHAGOGASTRODUODENOSCOPY (EGD);  Surgeon: Milus Banister, MD;  Location: [place];  Service: Endoscopy;  Laterality: N/A;  . ESOPHAGOGASTRODUODENOSCOPY (EGD) WITH PROPOFOL N/A 01/18/2015   Procedure:  ESOPHAGOGASTRODUODENOSCOPY (EGD) WITH PROPOFOL;  Surgeon: Lafayette Dragon, MD;  Location: [facility];  Service: Endoscopy;  Laterality: N/A;  . ESOPHAGOGASTRODUODENOSCOPY (EGD) WITH PROPOFOL N/A 09/04/2017   Procedure: ESOPHAGOGASTRODUODENOSCOPY (EGD) WITH PROPOFOL;  Surgeon: Ladene Artist, MD;  Location: [facility];  Service: Endoscopy;  Laterality: N/A;  need fluro  . ESOPHAGOGASTRODUODENOSCOPY (EGD) WITH PROPOFOL N/A 10/01/2017   Procedure: ESOPHAGOGASTRODUODENOSCOPY (EGD) WITH PROPOFOL;  Surgeon: Ladene Artist, MD;  Location: [facility];  Service: Endoscopy;  Laterality: N/A;  . HAND SURGERY Right   . SAVORY DILATION N/A 09/04/2017   Procedure: SAVORY DILATION;  Surgeon: Ladene Artist, MD;  Location: [facility];  Service: Endoscopy;  Laterality: N/A;  . SAVORY DILATION N/A 10/01/2017   Procedure: SAVORY DILATION;  Surgeon: Ladene Artist, MD;  Location[facility];  Service: Endoscopy;  Laterality: N/A;  . UPPER GASTROINTESTINAL ENDOSCOPY      Family History  Problem Relation Age of Onset  . Hypertension Mother   . Hypertension Father   . Colon cancer Neg Hx   . Esophageal cancer Neg Hx   . Rectal cancer Neg Hx   . Stomach cancer Neg Hx   . Colon polyps Neg Hx   . Liver cancer Neg Hx   . Pancreatic cancer Neg Hx     Social History   Tobacco Use  . Smoking status: Never Smoker  . Smokeless tobacco: Never Used  Substance Use Topics  .  Alcohol use: Not Currently  . Drug use: Not Currently    Comment: currently taking Methadone    Prior to Admission medications   Medication Sig Start Date End Date Taking? Authorizing Provider  famotidine (PEPCID) 20 MG tablet Take 20 mg by mouth 2 (two) times daily.    Yes [provider]  hydrOXYzine (ATARAX/VISTARIL) 25 MG tablet TAKE 1 TO 2 TABLETS BY MOUTH EVERY NIGHT AT BEDTIME AS NEEDED FOR INSOMNIA Patient taking differently: Take 25-50 mg by mouth at bedtime as needed for anxiety or nausea.  11/25/19  Yes  Burchette, Alinda Sierras, MD  methadone (DOLOPHINE) 10 MG/ML solution Take 45 mg by mouth daily.   Yes [provider]  pantoprazole (PROTONIX) 40 MG tablet TAKE 1 TABLET(40 MG) BY MOUTH TWICE DAILY BEFORE A MEAL Patient taking differently: Take 40 mg by mouth 2 (two) times daily.  04/10/19  Yes Ladene Artist, MD  promethazine (PHENERGAN) 12.5 MG tablet TAKE 1 TABLET(12.5 MG) BY MOUTH THREE TIMES DAILY AS NEEDED FOR NAUSEA OR VOMITING Patient taking differently: Take 12.5 mg by mouth every 8 (eight) hours as needed for nausea or vomiting.  11/25/19  Yes Ladene Artist, MD  polyethylene glycol (MIRALAX) 17 g packet Take 17 g by mouth 2 (two) times daily. Patient not taking: Reported on 12/14/2019 06/12/19   Palumbo, April, MD    Allergies Hydrocodone   REVIEW OF SYSTEMS  Negative except as noted here or in the History of Present Illness.   PHYSICAL EXAMINATION  Initial Vital Signs Blood pressure (!) 124/103, pulse (!) 123, temperature 99.1 F (37.3 C), temperature source Oral, resp. rate (!) 32, height '5\' 11"'$  (1.803 m), weight 82.3 kg, SpO2 96 %.  Examination General: Well-developed, well-nourished male in no acute distress; appearance consistent with age of record HENT: normocephalic; atraumatic Eyes: pupils equal, round and reactive to light; extraocular muscles intact Neck: supple Heart: regular rate and rhythm; tachycardia Lungs: Tachypnea; inspiratory and expiratory wheezes and rhonchi Abdomen: soft; nondistended; nontender; bowel sounds present Extremities: No deformity; full range of motion; pulses normal Neurologic: Awake, alert and oriented; motor function intact in all extremities and symmetric; no facial droop Skin: Warm and dry Psychiatric: Normal mood and affect   RESULTS  Summary of this visit's results, reviewed and interpreted by myself:   EKG Interpretation  Date/Time:  Saturday December 13 2019 23:06:40 EST Ventricular Rate:  115 PR Interval:    QRS  Duration: 86 QT Interval:  303 QTC Calculation: 419 R Axis:   73 Text Interpretation: Sinus tachycardia Low voltage, precordial leads Borderline T wave abnormalities Rate is faster Confirmed by Traven Davids, Jenny Reichmann [phone]) on 12/13/2019 11:18:37 PM      Laboratory Studies: Results for orders placed or performed during the hospital encounter of 12/13/19 (from the past 24 hour(s))  Urinalysis, Routine w reflex microscopic     Status: Abnormal   Collection Time: 12/13/19 11:13 PM  Result Value Ref Range   Color, Urine YELLOW YELLOW   APPearance CLEAR CLEAR   Specific Gravity, Urine 1.020 1.005 - 1.030   pH 6.0 5.0 - 8.0   Glucose, UA NEGATIVE NEGATIVE mg/dL   Hgb urine dipstick SMALL (A) NEGATIVE   Bilirubin Urine NEGATIVE NEGATIVE   Ketones, ur NEGATIVE NEGATIVE mg/dL   Protein, ur NEGATIVE NEGATIVE mg/dL   Nitrite NEGATIVE NEGATIVE   Leukocytes,Ua NEGATIVE NEGATIVE   RBC / HPF 6-10 0 - 5 RBC/hpf   WBC, UA 0-5 0 - 5 WBC/hpf   Bacteria, UA  NONE SEEN NONE SEEN   Squamous Epithelial / LPF 0-5 0 - 5   Mucus PRESENT   Comprehensive metabolic panel     Status: Abnormal   Collection Time: 12/13/19 11:45 PM  Result Value Ref Range   Sodium 139 135 - 145 mmol/L   Potassium 4.1 3.5 - 5.1 mmol/L   Chloride 106 98 - 111 mmol/L   CO2 26 22 - 32 mmol/L   Glucose, Bld 126 (H) 70 - 99 mg/dL   BUN 21 (H) 6 - 20 mg/dL   Creatinine, Ser 0.90 0.61 - 1.24 mg/dL   Calcium 8.6 (L) 8.9 - 10.3 mg/dL   Total Protein 6.7 6.5 - 8.1 g/dL   Albumin 3.4 (L) 3.5 - 5.0 g/dL   AST 20 15 - 41 U/L   ALT 16 0 - 44 U/L   Alkaline Phosphatase 90 38 - 126 U/L   Total Bilirubin 0.3 0.3 - 1.2 mg/dL   GFR calc non Af Amer >60 >60 mL/min   GFR calc Af Amer >60 >60 mL/min   Anion gap 7 5 - 15  Lactic acid, plasma     Status: None   Collection Time: 12/13/19 11:45 PM  Result Value Ref Range   Lactic Acid, Venous 1.3 0.5 - 1.9 mmol/L  CBC with Differential     Status: Abnormal   Collection Time: 12/13/19 11:45 PM   Result Value Ref Range   WBC 5.3 4.0 - 10.5 K/uL   RBC 4.61 4.22 - 5.81 MIL/uL   Hemoglobin 11.0 (L) 13.0 - 17.0 g/dL   HCT 37.1 (L) 39.0 - 52.0 %   MCV 80.5 80.0 - 100.0 fL   MCH 23.9 (L) 26.0 - 34.0 pg   MCHC 29.6 (L) 30.0 - 36.0 g/dL   RDW 15.9 (H) 11.5 - 15.5 %   Platelets 369 150 - 400 K/uL   nRBC 0.0 0.0 - 0.2 %   Neutrophils Relative % 56 %   Neutro Abs 2.9 1.7 - 7.7 K/uL   Lymphocytes Relative 31 %   Lymphs Abs 1.7 0.7 - 4.0 K/uL   Monocytes Relative 6 %   Monocytes Absolute 0.3 0.1 - 1.0 K/uL   Eosinophils Relative 6 %   Eosinophils Absolute 0.3 0.0 - 0.5 K/uL   Basophils Relative 1 %   Basophils Absolute 0.0 0.0 - 0.1 K/uL   Immature Granulocytes 0 %   Abs Immature Granulocytes 0.02 0.00 - 0.07 K/uL  Protime-INR     Status: None   Collection Time: 12/13/19 11:45 PM  Result Value Ref Range   Prothrombin Time 13.0 11.4 - 15.2 seconds   INR 1.0 0.8 - 1.2  D-dimer, quantitative (not at [facility])     Status: Abnormal   Collection Time: 12/13/19 11:45 PM  Result Value Ref Range   D-Dimer, Quant 0.77 (H) 0.00 - 0.50 ug/mL-FEU  POC SARS Coronavirus 2 Ag-ED - Nasal Swab (BD Veritor Kit)     Status: None   Collection Time: 12/14/19 12:15 AM  Result Value Ref Range   SARS Coronavirus 2 Ag NEGATIVE NEGATIVE  Lactic acid, plasma     Status: None   Collection Time: 12/14/19  1:10 AM  Result Value Ref Range   Lactic Acid, Venous 1.0 0.5 - 1.9 mmol/L   Imaging Studies: CT Angio Chest PE W and/or Wo Contrast  Result Date: 12/14/2019 CLINICAL DATA:  Shortness of breath and hypoxia with elevated D-dimer. EXAM: CT ANGIOGRAPHY CHEST WITH CONTRAST TECHNIQUE: Multidetector CT imaging of  the chest was performed using the standard protocol during bolus administration of intravenous contrast. Multiplanar CT image reconstructions and MIPs were obtained to evaluate the vascular anatomy. CONTRAST:  185m OMNIPAQUE IOHEXOL 350 MG/ML SOLN COMPARISON:  05/15/2015 FINDINGS: Cardiovascular: The  heart size is normal. No substantial pericardial effusion. No thoracic aortic aneurysm. There is no filling defect within the opacified pulmonary arteries to suggest the presence of an acute pulmonary embolus. Mediastinum/Nodes: Scattered small and upper normal mediastinal lymph nodes evident. Lower paraesophageal lymph nodes adjacent to a hiatal hernia measured 10 mm short axis. Similar lymph nodes are seen previously along the distal esophagus suggesting reactive etiology. Small to moderate hiatal hernia. There is no axillary lymphadenopathy. Lungs/Pleura: Patchy subtle nodular ground-glass opacity noted in the central right lower lobe. Trace atelectasis in the dependent lung bases. No pleural effusion. No focal airspace consolidation. Upper Abdomen: 19 mm ill-defined hypoattenuating lesion identified in the posterior hepatic dome with adjacent 7 mm low-density focal abnormality. 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 Musculoskeletal: No worrisome lytic or sclerotic osseous abnormality. Review of the MIP images confirms the above findings. IMPRESSION: 1. No CT evidence for acute pulmonary embolus. 2. Subtle nodular ground-glass opacity in the central right lower lobe, likely related to an infectious/inflammatory process. Atypical infection should be considered. 3. Small to moderate hiatal hernia with borderline lower paraesophageal lymphadenopathy not substantially changed since 2016, likely reactive. 4. 19 mm ill-defined hypoattenuating lesion in the posterior hepatic dome previously demonstrated to represent vascular malformation on an abdomen/pelvis CT of 12/05/2014. Electronically Signed   By: EMisty StanleyM.D.   On: 12/14/2019 04:22   DG Chest Port 1 View  Result Date: 12/13/2019 CLINICAL DATA:  Shortness of breath EXAM: PORTABLE CHEST 1 VIEW COMPARISON:  11/24/2018 FINDINGS: The  heart size and mediastinal contours are within normal limits. Both lungs are clear. The visualized skeletal structures are unremarkable. IMPRESSION: No active disease. Electronically Signed   By: MRanda NgoM.D.   On: 12/13/2019 23:34    ED COURSE and MDM  Nursing notes, initial and subsequent vitals signs, including pulse oximetry, reviewed and interpreted by myself.  Vitals:   12/14/19 0300 12/14/19 0315 12/14/19 0330 12/14/19 0345  BP: 106/72 112/74 99/70 102/64  Pulse: 87 94 99 91  Resp: '12 17 13 13  '$ Temp:      TempSrc:      SpO2: 95% 99% 97% 97%  Weight:      Height:       Medications  albuterol (VENTOLIN HFA) 108 (90 Base) MCG/ACT inhaler 2 puff (has no administration in time range)  sodium chloride (PF) 0.9 % injection (has no administration in time range)  albuterol (VENTOLIN HFA) 108 (90 Base) MCG/ACT inhaler 8 puff (8 puffs Inhalation Given 12/14/19 0053)  dexamethasone (DECADRON) injection 10 mg (10 mg Intravenous Given 12/14/19 0052)  aerochamber Z-Stat Plus/medium 1 each (1 each Other Given 12/14/19 0301)  iohexol (OMNIPAQUE) 350 MG/ML injection 100 mL (100 mLs Intravenous Contrast Given 12/14/19 0356)   1:39 AM Patient is feeling better and has minimal wheezing after initial albuterol treatment.  We will give him an albuterol inhaler with AeroChamber and instructed him in its use.    4:32 AM Faint expiratory wheeze at right base else lungs are clear.  Oxygen saturation 95% on room air.  COVID-19 antigen test negative.  Confirmatory COVID-19 test pending.  BNayef Collegewas  evaluated in Emergency Department on 12/14/2019 for the symptoms described in the history of present illness. He was evaluated in the context of the global COVID-19 pandemic, which necessitated consideration that the patient might be at risk for infection with the SARS-CoV-2 virus that causes COVID-19. Institutional protocols and algorithms that pertain to the evaluation of patients at risk for  COVID-19 are in a state of rapid change based on information released by regulatory bodies including the CDC and federal and state organizations. These policies and algorithms were followed during the patient's care in the ED.   PROCEDURES  Procedures   ED DIAGNOSES     ICD-10-CM   1. Acute bronchitis with bronchospasm  J20.9   2. SOB (shortness of breath)  R06.02 DG Chest [facility] [place] [address]       [place], [place], [place] 12/14/19 [phone]

## 2019-12-13 NOTE — ED Triage Notes (Signed)
Patient is complaining he can not breathe. Patient O2 sat is 86% on room air. Patient put on 2 liters [place].

## 2019-12-14 ENCOUNTER — Encounter (HOSPITAL_COMMUNITY): Payer: Self-pay

## 2019-12-14 ENCOUNTER — Emergency Department (HOSPITAL_COMMUNITY): Payer: Self-pay

## 2019-12-14 LAB — CBC WITH DIFFERENTIAL/PLATELET
Abs Immature Granulocytes: 0.02 10*3/uL (ref 0.00–0.07)
Basophils Absolute: 0 10*3/uL (ref 0.0–0.1)
Basophils Relative: 1 %
Eosinophils Absolute: 0.3 10*3/uL (ref 0.0–0.5)
Eosinophils Relative: 6 %
HCT: 37.1 % — ABNORMAL LOW (ref 39.0–52.0)
Hemoglobin: 11 g/dL — ABNORMAL LOW (ref 13.0–17.0)
Immature Granulocytes: 0 %
Lymphocytes Relative: 31 %
Lymphs Abs: 1.7 10*3/uL (ref 0.7–4.0)
MCH: 23.9 pg — ABNORMAL LOW (ref 26.0–34.0)
MCHC: 29.6 g/dL — ABNORMAL LOW (ref 30.0–36.0)
MCV: 80.5 fL (ref 80.0–100.0)
Monocytes Absolute: 0.3 10*3/uL (ref 0.1–1.0)
Monocytes Relative: 6 %
Neutro Abs: 2.9 10*3/uL (ref 1.7–7.7)
Neutrophils Relative %: 56 %
Platelets: 369 10*3/uL (ref 150–400)
RBC: 4.61 MIL/uL (ref 4.22–5.81)
RDW: 15.9 % — ABNORMAL HIGH (ref 11.5–15.5)
WBC: 5.3 10*3/uL (ref 4.0–10.5)
nRBC: 0 % (ref 0.0–0.2)

## 2019-12-14 LAB — COMPREHENSIVE METABOLIC PANEL
ALT: 16 U/L (ref 0–44)
AST: 20 U/L (ref 15–41)
Albumin: 3.4 g/dL — ABNORMAL LOW (ref 3.5–5.0)
Alkaline Phosphatase: 90 U/L (ref 38–126)
Anion gap: 7 (ref 5–15)
BUN: 21 mg/dL — ABNORMAL HIGH (ref 6–20)
CO2: 26 mmol/L (ref 22–32)
Calcium: 8.6 mg/dL — ABNORMAL LOW (ref 8.9–10.3)
Chloride: 106 mmol/L (ref 98–111)
Creatinine, Ser: 0.9 mg/dL (ref 0.61–1.24)
GFR calc Af Amer: >60 mL/min (ref >60)
GFR calc non Af Amer: >60 mL/min (ref >60)
Glucose, Bld: 126 mg/dL — ABNORMAL HIGH (ref 70–99)
Potassium: 4.1 mmol/L (ref 3.5–5.1)
Sodium: 139 mmol/L (ref 135–145)
Total Bilirubin: 0.3 mg/dL (ref 0.3–1.2)
Total Protein: 6.7 g/dL (ref 6.5–8.1)

## 2019-12-14 LAB — URINALYSIS, ROUTINE W REFLEX MICROSCOPIC
Bacteria, UA: NONE SEEN
Bilirubin Urine: NEGATIVE
Glucose, UA: NEGATIVE mg/dL
Ketones, ur: NEGATIVE mg/dL
Leukocytes,Ua: NEGATIVE
Nitrite: NEGATIVE
Protein, ur: NEGATIVE mg/dL
Specific Gravity, Urine: 1.02 (ref 1.005–1.030)
pH: 6 (ref 5.0–8.0)

## 2019-12-14 LAB — LACTIC ACID, PLASMA
Lactic Acid, Venous: 1 mmol/L (ref 0.5–1.9)
Lactic Acid, Venous: 1.3 mmol/L (ref 0.5–1.9)

## 2019-12-14 LAB — PROTIME-INR
INR: 1 (ref 0.8–1.2)
Prothrombin Time: 13 seconds (ref 11.4–15.2)

## 2019-12-14 LAB — D-DIMER, QUANTITATIVE: D-Dimer, Quant: 0.77 ug/mL-FEU — ABNORMAL HIGH (ref 0.00–0.50)

## 2019-12-14 LAB — POC SARS CORONAVIRUS 2 AG -  ED: SARS Coronavirus 2 Ag: NEGATIVE

## 2019-12-14 MED ORDER — SODIUM CHLORIDE (PF) 0.9 % IJ SOLN
INTRAMUSCULAR | Status: AC
  Filled 2019-12-14: qty 50

## 2019-12-14 MED ORDER — IOHEXOL 350 MG/ML SOLN
100.0000 mL | Freq: Once | INTRAVENOUS | Status: AC | PRN
  Administered 2019-12-14: 100 mL via INTRAVENOUS

## 2019-12-14 MED ORDER — ALBUTEROL SULFATE HFA 108 (90 BASE) MCG/ACT IN AERS: 2.0000 | INHALATION_SPRAY | RESPIRATORY_TRACT | Status: DC | PRN

## 2019-12-14 MED ORDER — AEROCHAMBER Z-STAT PLUS/MEDIUM MISC
1.0000 | Freq: Once | Status: AC
  Administered 2019-12-14: 03:00:00 1
  Filled 2019-12-14: qty 1

## 2019-12-14 NOTE — ED Notes (Signed)
Pt sitting up in bed. Full monitor on. Denies any needs. Will continue to monitor.

## 2019-12-14 NOTE — ED Notes (Signed)
Pt lying in bed awake. Pt did not tolerate RA well. o2 sats dropped to the 80's. 2L [place] replaced. Will continue to monitor

## 2019-12-14 NOTE — ED Notes (Signed)
Pt sitting up in bed. NAD noted. Pt ready for d/c 

## 2019-12-14 NOTE — ED Notes (Signed)
Pt sitting up in bed awake. Updated on plan of care. Full monitor on. Will continue to monitor.

## 2019-12-18 ENCOUNTER — Other Ambulatory Visit: Payer: Self-pay

## 2019-12-18 ENCOUNTER — Ambulatory Visit (AMBULATORY_SURGERY_CENTER): Payer: Self-pay

## 2019-12-18 VITALS — Temp 97.9°F | Ht 71.0 in | Wt 183.0 lb

## 2019-12-18 DIAGNOSIS — K2210 Ulcer of esophagus without bleeding: Secondary | ICD-10-CM

## 2019-12-18 DIAGNOSIS — Z01818 Encounter for other preprocedural examination: Secondary | ICD-10-CM

## 2019-12-18 DIAGNOSIS — R1319 Other dysphagia: Secondary | ICD-10-CM

## 2019-12-18 DIAGNOSIS — R1310 Dysphagia, unspecified: Secondary | ICD-10-CM

## 2019-12-18 DIAGNOSIS — K222 Esophageal obstruction: Secondary | ICD-10-CM

## 2019-12-18 NOTE — Progress Notes (Signed)

## 2019-12-19 LAB — CULTURE, BLOOD (ROUTINE X 2)
Culture: NO GROWTH
Culture: NO GROWTH
Special Requests: ADEQUATE

## 2019-12-25 ENCOUNTER — Ambulatory Visit (INDEPENDENT_AMBULATORY_CARE_PROVIDER_SITE_OTHER): Payer: Self-pay

## 2019-12-25 ENCOUNTER — Other Ambulatory Visit: Payer: Self-pay | Admitting: Gastroenterology

## 2019-12-25 DIAGNOSIS — Z1159 Encounter for screening for other viral diseases: Secondary | ICD-10-CM

## 2019-12-26 LAB — SARS CORONAVIRUS 2 (TAT 6-24 HRS): SARS Coronavirus 2: NEGATIVE

## 2019-12-30 ENCOUNTER — Ambulatory Visit (AMBULATORY_SURGERY_CENTER): Payer: Self-pay | Admitting: Gastroenterology

## 2019-12-30 ENCOUNTER — Encounter: Payer: Self-pay | Admitting: Gastroenterology

## 2019-12-30 ENCOUNTER — Other Ambulatory Visit: Payer: Self-pay

## 2019-12-30 VITALS — BP 101/63 | HR 80 | Temp 96.8°F | Resp 10 | Ht 71.0 in | Wt 183.0 lb

## 2019-12-30 DIAGNOSIS — R1310 Dysphagia, unspecified: Secondary | ICD-10-CM

## 2019-12-30 DIAGNOSIS — K2210 Ulcer of esophagus without bleeding: Secondary | ICD-10-CM

## 2019-12-30 DIAGNOSIS — K222 Esophageal obstruction: Secondary | ICD-10-CM

## 2019-12-30 DIAGNOSIS — R1319 Other dysphagia: Secondary | ICD-10-CM

## 2019-12-30 MED ORDER — SODIUM CHLORIDE 0.9 % IV SOLN: 500.0000 mL | Freq: Once | INTRAVENOUS | Status: DC

## 2019-12-30 NOTE — Progress Notes (Signed)
Temp by LC, vitals by CW   Pt's states no medical or surgical changes since previsit or office visit.

## 2019-12-30 NOTE — Progress Notes (Signed)
Called to room to assist during endoscopic procedure.  Patient ID and intended procedure confirmed with present staff. Received instructions for my participation in the procedure from the performing physician.  

## 2019-12-30 NOTE — Op Note (Signed)
Eric Hood: Eric Hood Procedure Date: 12/30/2019 9:03 AM MRN: [id number] Endoscopist: Ladene Artist , MD Age: 39 Referring MD:  Date of Birth: 07/20/1981 Gender: Male Account #: [id number] Procedure:                Upper GI endoscopy Indications:              Dysphagia, For therapy of esophageal stricture Medicines:                Monitored Anesthesia Care Procedure:                Pre-Anesthesia Assessment:                           - Prior to the procedure, a History and Physical                            was performed, and patient medications and                            allergies were reviewed. The patient's tolerance of                            previous anesthesia was also reviewed. The risks                            and benefits of the procedure and the sedation                            options and risks were discussed with the patient.                            All questions were answered, and informed consent                            was obtained. Prior Anticoagulants: The patient has                            taken no previous anticoagulant or antiplatelet                            agents. ASA Grade Assessment: II - A patient with                            mild systemic disease. After reviewing the risks                            and benefits, the patient was deemed in                            satisfactory condition to undergo the procedure.                           After obtaining informed consent, the endoscope was  passed under direct vision. Throughout the                            procedure, the patient's blood pressure, pulse, and                            oxygen saturations were monitored continuously. The                            Endoscope was introduced through the mouth, and                            advanced to the second part of duodenum. The upper                            GI endoscopy  was accomplished without difficulty.                            The patient tolerated the procedure well. Scope In: Scope Out: Findings:                 One benign-appearing, intrinsic moderate stenosis                            was found 26 cm from the incisors. This stenosis                            measured 1.1 cm (inner diameter) x less than one cm                            (in length). The stenosis was traversed. A                            guidewire was placed and the scope was withdrawn.                            Dilations were performed with Savary dilators with                            mild resistance at 12 mm, 13 mm, 14 mm and 15 mm.                            Small heme on each dilator.                           There were esophageal mucosal changes secondary to                            established long-segment Barrett's disease present                            in the mid esophagus and in the distal esophagus.  The maximum longitudinal extent of these mucosal                            changes was 10 cm in length.                           One cratered esophageal ulcer with no bleeding and                            no stigmata of recent bleeding was found 36 cm from                            the incisors. The lesion was 10 mm in largest                            dimension.                           Three superficial esophageal ulcers with no                            bleeding and no stigmata of recent bleeding were                            found from 31 cm to 35 cm from the incisors. The                            largest lesion was 8 mm in largest dimension.                           The exam of the esophagus was otherwise normal.                           A medium-sized hiatal hernia was present.                           The exam of the stomach was otherwise normal.                           The duodenal bulb and second portion of  the                            duodenum were normal. Complications:            No immediate complications. Estimated Blood Loss:     Estimated blood loss was minimal. Impression:               - Benign-appearing esophageal stenosis. Dilated.                           - Esophageal mucosal changes secondary to                            established long-segment Barrett's disease.                           -  Esophageal ulcer with no bleeding and no stigmata                            of recent bleeding.                           - Esophageal ulcers with no bleeding and no                            stigmata of recent bleeding.                           - Medium-sized hiatal hernia.                           - Normal duodenal bulb and second portion of the                            duodenum.                           - No specimens collected. Recommendation:           - Patient has a contact number available for                            emergencies. The signs and symptoms of potential                            delayed complications were discussed with the                            patient. Return to normal activities tomorrow.                            Written discharge instructions were provided to the                            patient.                           - Clear liquid diet for 2 hours, then advance as                            tolerated to soft diet today.                           - Resume prior diet tomorrow.                           - Antireflux measures with HOB elevated long term                           - Continue present medications.                           - Return to GI  office in 6 weeks. Ladene Artist, MD 12/30/2019 9:23:50 AM This report has been signed electronically.

## 2019-12-30 NOTE — Patient Instructions (Signed)
YOU HAD AN ENDOSCOPIC PROCEDURE TODAY AT THE [facility]:   Refer to the procedure report that was given to you for any specific questions about what was found during the examination.  If the procedure report does not answer your questions, please call your gastroenterologist to clarify.  If you requested that your care partner not be given the details of your procedure findings, then the procedure report has been included in a sealed envelope for you to review at your convenience later.  YOU SHOULD EXPECT: Some feelings of bloating in the abdomen. Passage of more gas than usual.  Walking can help get rid of the air that was put into your GI tract during the procedure and reduce the bloating. If you had a lower endoscopy (such as a colonoscopy or flexible sigmoidoscopy) you may notice spotting of blood in your stool or on the toilet paper. If you underwent a bowel prep for your procedure, you may not have a normal bowel movement for a few days.  Please Note:  You might notice some irritation and congestion in your nose or some drainage.  This is from the oxygen used during your procedure.  There is no need for concern and it should clear up in a day or so.  SYMPTOMS TO REPORT IMMEDIATELY:    Following upper endoscopy (EGD)  Vomiting of blood or coffee ground material  New chest pain or pain under the shoulder blades  Painful or persistently difficult swallowing  New shortness of breath  Fever of 100F or higher  Black, tarry-looking stools  For urgent or emergent issues, a gastroenterologist can be reached at any hour by calling [phone]. Do not use MyChart messaging for urgent concerns.    DIET:  We do recommend a small meal at first, but then you may proceed to your regular diet.  Drink plenty of fluids but you should avoid alcoholic beverages for 24 hours.  ACTIVITY:  You should plan to take it easy for the rest of today and you should NOT DRIVE or use heavy machinery  until tomorrow (because of the sedation medicines used during the test).    FOLLOW UP: Our staff will call the number listed on your records 48-72 hours following your procedure to check on you and address any questions or concerns that you may have regarding the information given to you following your procedure. If we do not reach you, we will leave a message.  We will attempt to reach you two times.  During this call, we will ask if you have developed any symptoms of COVID 19. If you develop any symptoms (ie: fever, flu-like symptoms, shortness of breath, cough etc.) before then, please call [phone].  If you test positive for Covid 19 in the 2 weeks post procedure, please call and report this information to us.    If any biopsies were taken you will be contacted by phone or by letter within the next 1-3 weeks.  Please call us at [phone] if you have not heard about the biopsies in 3 weeks.    SIGNATURES/CONFIDENTIALITY: You and/or your care partner have signed paperwork which will be entered into your electronic medical record.  These signatures attest to the fact that that the information above on your After Visit Summary has been reviewed and is understood.  Full responsibility of the confidentiality of this discharge information lies with you and/or your care-partner. 

## 2019-12-30 NOTE — Progress Notes (Signed)
Report given to PACU, vss 

## 2019-12-30 NOTE — Progress Notes (Signed)
Return to GI office in 6 weeks.

## 2020-01-01 ENCOUNTER — Telehealth: Payer: Self-pay

## 2020-01-01 NOTE — Telephone Encounter (Signed)
  Follow up Call-  Call back number 12/30/2019 11/27/2019 10/06/2019 04/08/2019 03/11/2019 02/06/2019 01/03/2019  Post procedure Call Back phone  # [phone] [id number] [phone] [id number]  Permission to leave phone message Yes Yes Yes Yes Yes Yes Yes  Some recent data might be hidden     Patient questions:  Do you have a fever, pain , or abdominal swelling? No. Pain Score  0 *  Have you tolerated food without any problems? Yes.    Have you been able to return to your normal activities? Yes.    Do you have any questions about your discharge instructions: Diet   Yes.   Medications  Yes.   Follow up visit  Yes.    Do you have questions or concerns about your Care? No.  Actions: * If pain score is 4 or above: No action needed, pain <4.  1. Have you developed a fever since your procedure? no  2.   Have you had an respiratory symptoms (SOB or cough) since your procedure? no  3.   Have you tested positive for COVID 19 since your procedure no  4.   Have you had any family members/close contacts diagnosed with the COVID 19 since your procedure?  no   If yes to any of these questions please route to Joylene John, RN and Alphonsa Gin, Therapist, sports.

## 2020-01-07 ENCOUNTER — Other Ambulatory Visit: Payer: Self-pay | Admitting: Gastroenterology

## 2020-04-14 ENCOUNTER — Other Ambulatory Visit: Payer: Self-pay

## 2020-04-14 ENCOUNTER — Telehealth: Payer: Self-pay | Admitting: Gastroenterology

## 2020-04-14 ENCOUNTER — Ambulatory Visit: Payer: Self-pay | Admitting: Gastroenterology

## 2020-04-14 DIAGNOSIS — K222 Esophageal obstruction: Secondary | ICD-10-CM

## 2020-04-14 NOTE — Telephone Encounter (Signed)
Called patient back and he states he woke-up this morning and vomited undigested food. Eric Hood he has been having trouble with some foods getting stuck again, for about 2 weeks. No problem with liquids. Would like to schedule another EGD. Please advise.

## 2020-04-14 NOTE — Telephone Encounter (Signed)
Schedule EGD with dilation at [facility] Full liquids to soft foods as tolerated until dilation Be sure he is taking his medications as prescribed: pantoprazole 40 mg po bid and famotidine 20 mg hs

## 2020-04-14 NOTE — Telephone Encounter (Signed)
Pt called stating that he missed his appt this morning because he woke up sick (vomiting). Pt is requesting to have an egd instead of ov. He said that his esophagus is narrowing again as he has been choking. Pls call him.

## 2020-04-14 NOTE — Telephone Encounter (Signed)
Scheduled EGD w/dilation on 04/26/20 in [place]. COVID testing on 04/22/20 at 8:00am @ [id number]. Instructions for EGD given over the phone (patient has had several of these). Asked him to stay on Full liquid to soft foods until his EGD. He states he has been taking his protonix BID and his Pepcid QHS and sometimes on additional time during the day prn

## 2020-04-22 ENCOUNTER — Other Ambulatory Visit: Payer: Self-pay | Admitting: Gastroenterology

## 2020-04-22 ENCOUNTER — Ambulatory Visit (INDEPENDENT_AMBULATORY_CARE_PROVIDER_SITE_OTHER): Payer: Self-pay

## 2020-04-22 ENCOUNTER — Other Ambulatory Visit: Payer: Self-pay

## 2020-04-22 DIAGNOSIS — Z1159 Encounter for screening for other viral diseases: Secondary | ICD-10-CM

## 2020-04-22 LAB — SARS CORONAVIRUS 2 (TAT 6-24 HRS): SARS Coronavirus 2: NEGATIVE

## 2020-04-26 ENCOUNTER — Ambulatory Visit (AMBULATORY_SURGERY_CENTER): Payer: Self-pay | Admitting: Gastroenterology

## 2020-04-26 ENCOUNTER — Other Ambulatory Visit: Payer: Self-pay

## 2020-04-26 ENCOUNTER — Encounter: Payer: Self-pay | Admitting: Gastroenterology

## 2020-04-26 VITALS — BP 100/48 | HR 72 | Temp 98.4°F | Resp 12 | Ht 71.0 in | Wt 183.0 lb

## 2020-04-26 DIAGNOSIS — K2100 Gastro-esophageal reflux disease with esophagitis, without bleeding: Secondary | ICD-10-CM

## 2020-04-26 DIAGNOSIS — K449 Diaphragmatic hernia without obstruction or gangrene: Secondary | ICD-10-CM

## 2020-04-26 DIAGNOSIS — R1310 Dysphagia, unspecified: Secondary | ICD-10-CM

## 2020-04-26 DIAGNOSIS — R1319 Other dysphagia: Secondary | ICD-10-CM

## 2020-04-26 DIAGNOSIS — K222 Esophageal obstruction: Secondary | ICD-10-CM

## 2020-04-26 MED ORDER — SODIUM CHLORIDE 0.9 % IV SOLN: 500.0000 mL | Freq: Once | INTRAVENOUS | Status: DC

## 2020-04-26 NOTE — Progress Notes (Signed)
Report given to PACU, vss 

## 2020-04-26 NOTE — Progress Notes (Signed)
1310 Robin[address] 0.1 mg IV given due large amount of secretions upon assessment.  MD made aware, vss  

## 2020-04-26 NOTE — Op Note (Signed)
[place] Patient Name: Eric Hood Procedure Date: 04/26/2020 1:33 PM MRN: [id number] Endoscopist: Ladene Artist , MD Age: 39 Referring MD:  Date of Birth: [birth year] Gender: Male Account #: [id number] Procedure:                Upper GI endoscopy Indications:              Dysphagia, For therapy of esophageal stricture Medicines:                Monitored Anesthesia Care Procedure:                Pre-Anesthesia Assessment:                           - Prior to the procedure, a History and Physical                            was performed, and patient medications and                            allergies were reviewed. The patient's tolerance of                            previous anesthesia was also reviewed. The risks                            and benefits of the procedure and the sedation                            options and risks were discussed with the patient.                            All questions were answered, and informed consent                            was obtained. Prior Anticoagulants: The patient has                            taken no previous anticoagulant or antiplatelet                            agents. ASA Grade Assessment: II - A patient with                            mild systemic disease. After reviewing the risks                            and benefits, the patient was deemed in                            satisfactory condition to undergo the procedure.                           After obtaining informed consent, the endoscope was  passed under direct vision. Throughout the                            procedure, the patient's blood pressure, pulse, and                            oxygen saturations were monitored continuously. The                            Endoscope was introduced through the mouth, and                            advanced to the second part of duodenum. The upper                            GI endoscopy  was accomplished without difficulty.                            The patient tolerated the procedure well. Scope In: Scope Out: Findings:                 LA Grade C (one or more mucosal breaks continuous                            between tops of 2 or more mucosal folds, less than                            75% circumference) esophagitis with no bleeding was                            found in the distal esophagus.                           Three cratered and superficial esophageal ulcers                            with no bleeding and no stigmata of recent bleeding                            were found 31 cm, 35 cm and 36 cm from the                            incisors. The largest lesion at 35 cm was 12 mm in                            largest dimension.                           There were esophageal mucosal changes secondary to                            established long-segment Barrett's disease present  in the distal esophagus. The maximum longitudinal                            extent of these mucosal changes was 10 cm in length.                           One benign-appearing, intrinsic moderate stenosis                            was found 26 cm from the incisors. This stenosis                            measured 1 cm (inner diameter) x less than one cm                            (in length). The stenosis was traversed after                            dilation. A guidewire was placed and the scope was                            withdrawn. Dilations were performed with Savary                            dilators with mild resistance at 12 mm, 13 mm and                            14 mm. The dilation site was examined following                            endoscope reinsertion and showed moderate mucosal                            disruption and moderate improvement in luminal                            narrowing.                           A medium-sized hiatal  hernia was present.                           The exam of the stomach was otherwise normal.                           The duodenal bulb and second portion of the                            duodenum were normal. Complications:            No immediate complications. Estimated Blood Loss:     Estimated blood loss was minimal. Impression:               - LA Grade C reflux esophagitis with no bleeding.                           -  Esophageal ulcers with no bleeding and no                            stigmata of recent bleeding.                           - Esophageal mucosal changes secondary to                            established long-segment Barrett's disease.                           - Benign-appearing esophageal stenosis. Dilated.                           - Medium-sized hiatal hernia.                           - Normal duodenal bulb and second portion of the                            duodenum.                           - No specimens collected. Recommendation:           - Patient has a contact number available for                            emergencies. The signs and symptoms of potential                            delayed complications were discussed with the                            patient. Return to normal activities tomorrow.                            Written discharge instructions were provided to the                            patient.                           - Clear liquid diet for 2 hours, then advance as                            tolerated to soft diet today.                           - Resume prior diet tomorrow.                           - Antireflux measures long term.                           - Continue present medications including  pantoprazole 40 mg po bid and famotidine 20 mg po                            qh.                           - Repeat upper endoscopy with dilation in 1 month. Ladene Artist, MD 04/26/2020 1:57:38 PM This  report has been signed electronically.

## 2020-04-26 NOTE — Patient Instructions (Signed)
Continue to take your medications as prescribed.  You are scheduled for a repeat endoscopy as ordered.  YOU HAD AN ENDOSCOPIC PROCEDURE TODAY AT [facility]:   Refer to the procedure report that was given to you for any specific questions about what was found during the examination.  If the procedure report does not answer your questions, please call your gastroenterologist to clarify.  If you requested that your care partner not be given the details of your procedure findings, then the procedure report has been included in a sealed envelope for you to review at your convenience later.  YOU SHOULD EXPECT: Some feelings of bloating in the abdomen. Passage of more gas than usual.  Walking can help get rid of the air that was put into your GI tract during the procedure and reduce the bloating. If you had a lower endoscopy (such as a colonoscopy or flexible sigmoidoscopy) you may notice spotting of blood in your stool or on the toilet paper. If you underwent a bowel prep for your procedure, you may not have a normal bowel movement for a few days.  Please Note:  You might notice some irritation and congestion in your nose or some drainage.  This is from the oxygen used during your procedure.  There is no need for concern and it should clear up in a day or so.  SYMPTOMS TO REPORT IMMEDIATELY:   Following upper endoscopy (EGD)  Vomiting of blood or coffee ground material  New chest pain or pain under the shoulder blades  Painful or persistently difficult swallowing  New shortness of breath  Fever of 100F or higher  Black, tarry-looking stools  For urgent or emergent issues, a gastroenterologist can be reached at any hour by calling [phone]. Do not use MyChart messaging for urgent concerns.    DIET:  We do recommend clear liquids until 4 pm.  We suggest a soft diet for the rest of today.  You may have a regular diet tomorrow.  ACTIVITY:  You should plan to take it easy for  the rest of today and you should NOT DRIVE or use heavy machinery until tomorrow (because of the sedation medicines used during the test).    FOLLOW UP: Our staff will call the number listed on your records 48-72 hours following your procedure to check on you and address any questions or concerns that you may have regarding the information given to you following your procedure. If we do not reach you, we will leave a message.  We will attempt to reach you two times.  During this call, we will ask if you have developed any symptoms of COVID 19. If you develop any symptoms (ie: fever, flu-like symptoms, shortness of breath, cough etc.) before then, please call [phone].  If you test positive for Covid 19 in the 2 weeks post procedure, please call and report this information to [place].     SIGNATURES/CONFIDENTIALITY: You and/or your care partner have signed paperwork which will be entered into your electronic medical record.  These signatures attest to the fact that that the information above on your After Visit Summary has been reviewed and is understood.  Full responsibility of the confidentiality of this discharge information lies with you and/or your care-partner.

## 2020-04-26 NOTE — Progress Notes (Signed)
Called to room to assist during endoscopic procedure.  Patient ID and intended procedure confirmed with present staff. Received instructions for my participation in the procedure from the performing physician.  

## 2020-04-26 NOTE — Progress Notes (Signed)
The patient was given a date of 06/01/2020 at 63 m for his next endoscopy.  He had the same instructions for pre-visit with the times and date  changed accordingly.  Good feedback from patient regarding instructions.

## 2020-04-28 ENCOUNTER — Telehealth: Payer: Self-pay

## 2020-04-28 NOTE — Telephone Encounter (Signed)
  Follow up Call-  Call back number 04/26/2020 12/30/2019 11/27/2019 10/06/2019 04/08/2019 03/11/2019 02/06/2019  Post procedure Call Back phone  # [phone]  Permission to leave phone message Yes Yes Yes Yes Yes Yes Yes  Some recent data might be hidden     Patient questions:  Do you have a fever, pain , or abdominal swelling? No. Pain Score  0 *  Have you tolerated food without any problems? Yes.    Have you been able to return to your normal activities? Yes.    Do you have any questions about your discharge instructions: Diet   No. Medications  No. Follow up visit  No.  Do you have questions or concerns about your Care? No.  Actions: * If pain score is 4 or above: No action needed, pain <4.    1. Have you developed a fever since your procedure? No   2.   Have you had an respiratory symptoms (SOB or cough) since your procedure? No   3.   Have you tested positive for COVID 19 since your procedure? No   4.   Have you had any family members/close contacts diagnosed with the COVID 19 since your procedure?  No    If yes to any of these questions please route to Joylene John, RN and Erenest Rasher, RN

## 2020-04-29 ENCOUNTER — Other Ambulatory Visit: Payer: Self-pay | Admitting: Gastroenterology

## 2020-05-25 ENCOUNTER — Telehealth: Payer: Self-pay | Admitting: Gastroenterology

## 2020-05-25 NOTE — Telephone Encounter (Signed)
Spoke with patient, he was needing to get scheduled for COVID testing before procedure, he is scheduled for COVID screening on 05/28/20 at 10 am.

## 2020-05-25 NOTE — Telephone Encounter (Signed)
Pt is requesting a call back from a nurse to schedule a covid test, he wants to know if he needs.

## 2020-05-28 ENCOUNTER — Ambulatory Visit (INDEPENDENT_AMBULATORY_CARE_PROVIDER_SITE_OTHER): Payer: Self-pay

## 2020-05-28 ENCOUNTER — Other Ambulatory Visit: Payer: Self-pay | Admitting: Gastroenterology

## 2020-05-28 DIAGNOSIS — Z1159 Encounter for screening for other viral diseases: Secondary | ICD-10-CM

## 2020-05-28 LAB — SARS CORONAVIRUS 2 (TAT 6-24 HRS): SARS Coronavirus 2: NEGATIVE

## 2020-06-01 ENCOUNTER — Other Ambulatory Visit: Payer: Self-pay

## 2020-06-01 ENCOUNTER — Encounter: Payer: Self-pay | Admitting: Gastroenterology

## 2020-06-01 ENCOUNTER — Ambulatory Visit (AMBULATORY_SURGERY_CENTER): Payer: Self-pay | Admitting: Gastroenterology

## 2020-06-01 VITALS — BP 101/66 | HR 69 | Temp 98.2°F | Resp 11 | Ht 71.0 in | Wt 182.0 lb

## 2020-06-01 DIAGNOSIS — R1319 Other dysphagia: Secondary | ICD-10-CM

## 2020-06-01 DIAGNOSIS — K222 Esophageal obstruction: Secondary | ICD-10-CM

## 2020-06-01 DIAGNOSIS — K2210 Ulcer of esophagus without bleeding: Secondary | ICD-10-CM

## 2020-06-01 DIAGNOSIS — K449 Diaphragmatic hernia without obstruction or gangrene: Secondary | ICD-10-CM

## 2020-06-01 DIAGNOSIS — K2100 Gastro-esophageal reflux disease with esophagitis, without bleeding: Secondary | ICD-10-CM

## 2020-06-01 DIAGNOSIS — R1310 Dysphagia, unspecified: Secondary | ICD-10-CM

## 2020-06-01 MED ORDER — SODIUM CHLORIDE 0.9 % IV SOLN: 500.0000 mL | INTRAVENOUS | Status: DC

## 2020-06-01 NOTE — Op Note (Signed)
[place] Patient Name: Eric Hood Procedure Date: 06/01/2020 9:19 AM MRN: [id number] Endoscopist: Ladene Artist , MD Age: 39 Referring MD:  Date of Birth: Jun 13, 1981 Gender: Male Account #: [id number] Procedure:                Upper GI endoscopy Indications:              Therapeutic procedure, Dysphagia Medicines:                Monitored Anesthesia Care Procedure:                Pre-Anesthesia Assessment:                           - Prior to the procedure, a History and Physical                            was performed, and patient medications and                            allergies were reviewed. The patient's tolerance of                            previous anesthesia was also reviewed. The risks                            and benefits of the procedure and the sedation                            options and risks were discussed with the patient.                            All questions were answered, and informed consent                            was obtained. Prior Anticoagulants: The patient has                            taken no previous anticoagulant or antiplatelet                            agents. ASA Grade Assessment: III - A patient with                            severe systemic disease. After reviewing the risks                            and benefits, the patient was deemed in                            satisfactory condition to undergo the procedure.                           After obtaining informed consent, the endoscope was  passed under direct vision. Throughout the                            procedure, the patient's blood pressure, pulse, and                            oxygen saturations were monitored continuously. The                            Endoscope was introduced through the mouth, and                            advanced to the second part of duodenum. The upper                            GI endoscopy was  accomplished without difficulty.                            The patient tolerated the procedure well. Scope In: Scope Out: Findings:                 LA Grade C (one or more mucosal breaks continuous                            between tops of 2 or more mucosal folds, less than                            75% circumference) esophagitis with no bleeding was                            found in the entire esophagus.                           There were esophageal mucosal changes secondary to                            established long-segment Barrett's disease present                            in the distal esophagus. The maximum longitudinal                            extent of these mucosal changes was 10 cm in length.                           Three cratered and superficial esophageal ulcers at                            31 cm, 35 cm and 36 cm with no bleeding and no                            stigmata of recent bleeding were found. The largest  lesion was 10 mm in largest dimension.                           One benign-appearing, intrinsic severe stenosis was                            found 26 cm from the incisors. This stenosis                            measured 8 mm (inner diameter) x less than one cm                            (in length). The stenosis was traversed after                            dilation. A guidewire was placed and the scope was                            withdrawn. Dilation was performed with a Savary                            dilator with no resistance at 12 mm. Dilations                            performed with mild resistance and scant heme at 13                            mm, 14 mm and 15 mm. The dilation site was examined                            following endoscope reinsertion and showed moderate                            mucosal disruption and moderate improvement in                            luminal narrowing. Estimated blood  loss was minimal.                           The exam of the esophagus was otherwise normal.                           A medium-sized hiatal hernia was present.                           The exam of the stomach was otherwise normal.                           The duodenal bulb and second portion of the                            duodenum were normal. Complications:  No immediate complications. Estimated Blood Loss:     Estimated blood loss was minimal. Impression:               - LA Grade C reflux esophagitis with no bleeding.                           - Esophageal mucosal changes secondary to                            established long-segment Barrett's disease.                           - Esophageal ulcers with no bleeding and no                            stigmata of recent bleeding.                           - Benign-appearing esophageal stenosis. Dilated.                           - Medium-sized hiatal hernia.                           - Normal duodenal bulb and second portion of the                            duodenum.                           - No specimens collected. Recommendation:           - Patient has a contact number available for                            emergencies. The signs and symptoms of potential                            delayed complications were discussed with the                            patient. Return to normal activities tomorrow.                            Written discharge instructions were provided to the                            patient.                           - Clear liquid diet for 2 hours, then advance as                            tolerated to soft diet today.                           - Resume prior diet  tomorrow.                           - Follow antireflux measures including 4" HOB                            elevation long term.                           - Continue present medications. Impress need for                             PPI bid and H2RA hs.                           - Repeat upper endoscopy in 1 month for retreatment.                           - Discuss with patient referral for a second                            opinion at [facility]. Ladene Artist, MD 06/01/2020 9:43:44 AM This report has been signed electronically.

## 2020-06-01 NOTE — Progress Notes (Signed)
Called to room to assist during endoscopic procedure.  Patient ID and intended procedure confirmed with present staff. Received instructions for my participation in the procedure from the performing physician.  

## 2020-06-01 NOTE — Patient Instructions (Addendum)
Clear liquid diet until 11:45 am and then soft diet the rest of today.  Resume previous diet tomorrow.   Follow anti-reflux measures including 4" head of bed elevation long term.   Continue protonix twice a day and Pepcid at before bed.  Repeat EGD Wednesday 07/07/20 at 10:00 am, arrive at 9:00 am. Instructions given.    YOU HAD AN ENDOSCOPIC PROCEDURE TODAY AT [facility]:   Refer to the procedure report that was given to you for any specific questions about what was found during the examination.  If the procedure report does not answer your questions, please call your gastroenterologist to clarify.  If you requested that your care partner not be given the details of your procedure findings, then the procedure report has been included in a sealed envelope for you to review at your convenience later.  YOU SHOULD EXPECT: Some feelings of bloating in the abdomen. Passage of more gas than usual.  Walking can help get rid of the air that was put into your GI tract during the procedure and reduce the bloating. If you had a lower endoscopy (such as a colonoscopy or flexible sigmoidoscopy) you may notice spotting of blood in your stool or on the toilet paper. If you underwent a bowel prep for your procedure, you may not have a normal bowel movement for a few days.  Please Note:  You might notice some irritation and congestion in your nose or some drainage.  This is from the oxygen used during your procedure.  There is no need for concern and it should clear up in a day or so.  SYMPTOMS TO REPORT IMMEDIATELY:    Following upper endoscopy (EGD)  Vomiting of blood or coffee ground material  New chest pain or pain under the shoulder blades  Painful or persistently difficult swallowing  New shortness of breath  Fever of 100F or higher  Black, tarry-looking stools  For urgent or emergent issues, a gastroenterologist can be reached at any hour by calling [phone]. Do not use  MyChart messaging for urgent concerns.    DIET:  We do recommend a small meal at first, but then you may proceed to your regular diet.  Drink plenty of fluids but you should avoid alcoholic beverages for 24 hours.  ACTIVITY:  You should plan to take it easy for the rest of today and you should NOT DRIVE or use heavy machinery until tomorrow (because of the sedation medicines used during the test).    FOLLOW UP: Our staff will call the number listed on your records 48-72 hours following your procedure to check on you and address any questions or concerns that you may have regarding the information given to you following your procedure. If we do not reach you, we will leave a message.  We will attempt to reach you two times.  During this call, we will ask if you have developed any symptoms of COVID 19. If you develop any symptoms (ie: fever, flu-like symptoms, shortness of breath, cough etc.) before then, please call [phone].  If you test positive for Covid 19 in the 2 weeks post procedure, please call and report this information to [place].    If any biopsies were taken you will be contacted by phone or by letter within the next 1-3 weeks.  Please call us at [phone] if you have not heard about the biopsies in 3 weeks.    SIGNATURES/CONFIDENTIALITY: You and/or your care partner have signed paperwork  which will be entered into your electronic medical record.  These signatures attest to the fact that that the information above on your After Visit Summary has been reviewed and is understood.  Full responsibility of the confidentiality of this discharge information lies with you and/or your care-partner.

## 2020-06-01 NOTE — Progress Notes (Signed)
pt tolerated well. VSS. awake and to recovery. Report given to RN. Bite block placed and removed without trauma. 

## 2020-06-01 NOTE — Progress Notes (Signed)
V/s [place] 

## 2020-06-01 NOTE — Progress Notes (Signed)
Repeat EGD appointment made for 07/07/20 at 10:00 am.  Covid test scheduled for 07/05/20 at 9:00 am.  Instructions printed and reviewed with patient.

## 2020-06-03 ENCOUNTER — Telehealth: Payer: Self-pay

## 2020-06-03 NOTE — Telephone Encounter (Signed)
  Follow up Call-  Call back number 06/01/2020 04/26/2020 12/30/2019 11/27/2019 10/06/2019 04/08/2019 03/11/2019  Post procedure Call Back phone  # [phone]  Permission to leave phone message Yes Yes Yes Yes Yes Yes Yes  Some recent data might be hidden      Patient questions:  Do you have a fever, pain , or abdominal swelling? No. Pain Score  0 *  Have you tolerated food without any problems? Yes.    Have you been able to return to your normal activities? Yes.    Do you have any questions about your discharge instructions: Diet   No. Medications  No. Follow up visit  No.  Do you have questions or concerns about your Care? No.  Actions: * If pain score is 4 or above: No action needed, pain <4.  1. Have you developed a fever since your procedure? no  2.   Have you had an respiratory symptoms (SOB or cough) since your procedure? no  3.   Have you tested positive for COVID 19 since your procedure no  4.   Have you had any family members/close contacts diagnosed with the COVID 19 since your procedure?  no   If yes to any of these questions please route to Joylene John, RN and Joella Prince, RN

## 2020-06-24 ENCOUNTER — Telehealth: Payer: Self-pay | Admitting: Family Medicine

## 2020-06-24 ENCOUNTER — Other Ambulatory Visit: Payer: Self-pay

## 2020-06-24 ENCOUNTER — Telehealth (INDEPENDENT_AMBULATORY_CARE_PROVIDER_SITE_OTHER): Payer: Self-pay | Admitting: Family Medicine

## 2020-06-24 ENCOUNTER — Encounter: Payer: Self-pay | Admitting: Family Medicine

## 2020-06-24 DIAGNOSIS — R112 Nausea with vomiting, unspecified: Secondary | ICD-10-CM

## 2020-06-24 DIAGNOSIS — R42 Dizziness and giddiness: Secondary | ICD-10-CM

## 2020-06-24 DIAGNOSIS — Z862 Personal history of diseases of the blood and blood-forming organs and certain disorders involving the immune mechanism: Secondary | ICD-10-CM

## 2020-06-24 NOTE — Telephone Encounter (Signed)
Noted  

## 2020-06-24 NOTE — Patient Instructions (Signed)
  WORK SLIP:  Patient Eric Hood,  07/21/1981, was seen for a medical visit today, 06/24/2020. I have referred him for an inperson evaluation of his symptoms. Please excuse from work today and tomorrow until further evaluation.   Sincerely: E-signature: Dr. Colin Benton, DO [place] Ph: [phone]  --------------------------------------------------------------------------------------------------------  Drink plenty of fluids  Consider [phone] testing  Follow up for an inperson evaluation with your doctor or at the [facility] for labs and further evaluation tomorrow. Please call your primary care doctor office - I also sent a message to their schedulers.

## 2020-06-24 NOTE — Telephone Encounter (Signed)
Pt spoke to Dr. Maudie Mercury today regarding dizzy spells he has been having since Monday. She informed him that he needs an in office visit with his pCP tomorrow and that she will be sending a message regarding this. Message has not come through yet but sending note back to see if pt can be worked in?   Pt can be reached at [phone]

## 2020-06-24 NOTE — Progress Notes (Signed)
Virtual Visit via Video Note  I connected with Eric Hood  on 06/24/20 at  3:40 PM EDT by a video enabled telemedicine application and verified that I am speaking with the correct person using two identifiers.  Location patient: home Location provider:work or home office Persons participating in the virtual visit: patient, provider  I discussed the limitations of evaluation and management by telemedicine and the availability of in person appointments. The patient expressed understanding and agreed to proceed.   HPI:  Acute visit for Nausea and vomiting: -started 1 week ago -symptoms included: nausea and vomiting 1 week ago for 2 days, feels more tired than usual the last week, then felt dizzy on Monday for awhile when was overheated at work -started a new job recently, working nightshifts in a warehouse, has to wear a mask and he thinks that could be causing the symptoms -wants work note for tomorrow -feels ok today but still feels tired -Denies fevers, resp symptoms, cough, CP, SOB, abd pain, HA, diarrhea, melena, hematochezia, any further vomiting, bleeding, syncope -needs evaluation to be cleared for work -he is worried as has had unusual and low blood counts in the past and felt this way -not vaccinated for [place] -also has chronic GI issues - reports sees GI for this and has phenergan on hand when needs it  ROS: See pertinent positives and negatives per HPI.  Past Medical History:  Diagnosis Date  . Anemia 10/23/2014  . Anxiety   . Back pain   . Blood transfusion without reported diagnosis april 2016/feb 2020  . Breathing difficulty 12/13/2019   went to ER and felt unable to breathe  . Esophageal stricture 03/2014  . Esophageal ulcer   . GERD (gastroesophageal reflux disease)   . Hiatal hernia   . Reflux   . 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 Ulcerative esophagitis 03/2014   severe    Past Surgical History:  Procedure Laterality Date  . BALLOON DILATION N/A  05/20/2015   Procedure: BALLOON DILATION;  Surgeon: Milus Banister, MD;  Location: [facility];  Service: Endoscopy;  Laterality: N/A;  . COLONOSCOPY  2018  . ESOPHAGEAL DILATION     On 3 occasions: 03/2014, 08/2014, 09/2014  . ESOPHAGOGASTRODUODENOSCOPY N/A 05/20/2015   Procedure: ESOPHAGOGASTRODUODENOSCOPY (EGD);  Surgeon: Milus Banister, MD;  Location: [place];  Service: Endoscopy;  Laterality: N/A;  . ESOPHAGOGASTRODUODENOSCOPY (EGD) WITH PROPOFOL N/A 01/18/2015   Procedure: ESOPHAGOGASTRODUODENOSCOPY (EGD) WITH PROPOFOL;  Surgeon: Lafayette Dragon, MD;  Location: [facility];  Service: Endoscopy;  Laterality: N/A;  . ESOPHAGOGASTRODUODENOSCOPY (EGD) WITH PROPOFOL N/A 09/04/2017   Procedure: ESOPHAGOGASTRODUODENOSCOPY (EGD) WITH PROPOFOL;  Surgeon: Ladene Artist, MD;  Location: [facility];  Service: Endoscopy;  Laterality: N/A;  need fluro  . ESOPHAGOGASTRODUODENOSCOPY (EGD) WITH PROPOFOL N/A 10/01/2017   Procedure: ESOPHAGOGASTRODUODENOSCOPY (EGD) WITH PROPOFOL;  Surgeon: Ladene Artist, MD;  Location: [facility];  Service: Endoscopy;  Laterality: N/A;  . HAND SURGERY Right   . SAVORY DILATION N/A 09/04/2017   Procedure: SAVORY DILATION;  Surgeon: Ladene Artist, MD;  Location: [facility];  Service: Endoscopy;  Laterality: N/A;  . SAVORY DILATION N/A 10/01/2017   Procedure: SAVORY DILATION;  Surgeon: Ladene Artist, MD;  Location: [facility];  Service: Endoscopy;  Laterality: N/A;  . UPPER GASTROINTESTINAL ENDOSCOPY  11/27/2019    Family History  Problem Relation Age of Onset  . Hypertension Mother   . Hypertension Father   . Colon cancer Neg Hx   .  Esophageal cancer Neg Hx   . Rectal cancer Neg Hx   . Stomach cancer Neg Hx   . Colon polyps Neg Hx   . Liver cancer Neg Hx   . Pancreatic cancer Neg Hx     SOCIAL HX: see hpi   Current Outpatient Medications:  .  ALBUTEROL SULFATE HFA IN, Inhale 2 puffs into the lungs every 4 (four) hours as needed., Disp: ,  Rfl:  .  famotidine (PEPCID) 20 MG tablet, TAKE 1 TABLET(20 MG) BY MOUTH AT BEDTIME, Disp: 90 tablet, Rfl: 1 .  methadone (DOLOPHINE) 10 MG/ML solution, Take 55 mg by mouth daily. , Disp: , Rfl:  .  pantoprazole (PROTONIX) 40 MG tablet, TAKE 1 TABLET(40 MG) BY MOUTH TWICE DAILY BEFORE A MEAL (Patient taking differently: Take 40 mg by mouth 2 (two) times daily. ), Disp: 60 tablet, Rfl: 5 .  promethazine (PHENERGAN) 12.5 MG tablet, TAKE 1 TABLET(12.5 MG) BY MOUTH THREE TIMES DAILY AS NEEDED FOR NAUSEA OR VOMITING (Patient taking differently: Take 12.5 mg by mouth every 8 (eight) hours as needed for nausea or vomiting. ), Disp: 60 tablet, Rfl: 2  Current Facility-Administered Medications:  .  0.9 %  sodium chloride infusion, 500 mL, Intravenous, Once, Lucio Edward T, MD .  0.9 %  sodium chloride infusion, 500 mL, Intravenous, Continuous, Fuller Plan, Pricilla Riffle, MD  EXAM:  VITALS per patient if applicable:  GENERAL: alert, oriented, appears well and in no acute distress  HEENT: atraumatic, conjunttiva clear, no obvious abnormalities on inspection of external nose and ears  NECK: normal movements of the head and neck  LUNGS: on inspection no signs of respiratory distress, breathing rate appears normal, no obvious gross SOB, gasping or wheezing  CV: no obvious cyanosis  MS: moves all visible extremities without noticeable abnormality  PSYCH/NEURO: pleasant and cooperative, no obvious depression or anxiety, speech and thought processing grossly intact  ASSESSMENT AND PLAN:  Discussed the following assessment and plan:  Dizzy  Nausea and vomiting, intractability of vomiting not specified, unspecified vomiting type  History of anemia  -we discussed possible serious and likely etiologies, options for evaluation and workup, limitations of telemedicine visit vs in person visit, treatment, treatment risks and precautions. Seems to be doing ok now except for feeling tired - could potentially be  related to shift work.  However, given his history. And that he reports has felt this way in the past with anemia, advised inperson evaluation for neuro exam and labs. He feels the nausea and vomiting a week ago were due to his chronic issues. Advised [phone], viral illness and other etiologies can also cause this. He prefers to see PCP if possible. Message sent to PCP office to assists if available openings. In interim advised hydration and provided work note for today and tomorrow. Advised to seek inperson care at [facility] sooner if any severe symptoms and to go to [facility] if can not be seen by PCP tomorrow.  Work/School slipped offered:see patient instructions  I discussed the assessment and treatment plan with the patient. The patient was provided an opportunity to ask questions and all were answered. The patient agreed with the plan and demonstrated an understanding of the instructions.    Lucretia Kern, DO

## 2020-06-25 ENCOUNTER — Telehealth: Payer: Self-pay

## 2020-07-05 ENCOUNTER — Ambulatory Visit (INDEPENDENT_AMBULATORY_CARE_PROVIDER_SITE_OTHER): Payer: Self-pay

## 2020-07-05 ENCOUNTER — Other Ambulatory Visit: Payer: Self-pay | Admitting: Gastroenterology

## 2020-07-05 DIAGNOSIS — Z1159 Encounter for screening for other viral diseases: Secondary | ICD-10-CM

## 2020-07-05 LAB — SARS CORONAVIRUS 2 (TAT 6-24 HRS): SARS Coronavirus 2: NEGATIVE

## 2020-07-07 ENCOUNTER — Other Ambulatory Visit: Payer: Self-pay | Admitting: *Deleted

## 2020-07-07 ENCOUNTER — Encounter: Payer: Self-pay | Admitting: Gastroenterology

## 2020-07-07 ENCOUNTER — Other Ambulatory Visit: Payer: Self-pay

## 2020-07-07 ENCOUNTER — Ambulatory Visit (AMBULATORY_SURGERY_CENTER): Payer: Self-pay | Admitting: Gastroenterology

## 2020-07-07 ENCOUNTER — Other Ambulatory Visit (INDEPENDENT_AMBULATORY_CARE_PROVIDER_SITE_OTHER): Payer: Self-pay

## 2020-07-07 VITALS — BP 96/59 | HR 69 | Temp 96.8°F | Resp 10 | Ht 71.0 in | Wt 183.0 lb

## 2020-07-07 DIAGNOSIS — D509 Iron deficiency anemia, unspecified: Secondary | ICD-10-CM

## 2020-07-07 DIAGNOSIS — R1319 Other dysphagia: Secondary | ICD-10-CM

## 2020-07-07 DIAGNOSIS — K222 Esophageal obstruction: Secondary | ICD-10-CM

## 2020-07-07 DIAGNOSIS — K219 Gastro-esophageal reflux disease without esophagitis: Secondary | ICD-10-CM

## 2020-07-07 DIAGNOSIS — R1310 Dysphagia, unspecified: Secondary | ICD-10-CM

## 2020-07-07 DIAGNOSIS — K449 Diaphragmatic hernia without obstruction or gangrene: Secondary | ICD-10-CM

## 2020-07-07 LAB — COMPREHENSIVE METABOLIC PANEL
ALT: 11 U/L (ref 0–53)
AST: 16 U/L (ref 0–37)
Albumin: 3.6 g/dL (ref 3.5–5.2)
Alkaline Phosphatase: 72 U/L (ref 39–117)
BUN: 16 mg/dL (ref 6–23)
CO2: 28 mEq/L (ref 19–32)
Calcium: 8.5 mg/dL (ref 8.4–10.5)
Chloride: 106 mEq/L (ref 96–112)
Creatinine, Ser: 1.11 mg/dL (ref 0.40–1.50)
GFR: 74.1 mL/min (ref >60.00)
Glucose, Bld: 79 mg/dL (ref 70–99)
Potassium: 4.1 mEq/L (ref 3.5–5.1)
Sodium: 139 mEq/L (ref 135–145)
Total Bilirubin: 0.4 mg/dL (ref 0.2–1.2)
Total Protein: 6.3 g/dL (ref 6.0–8.3)

## 2020-07-07 LAB — CBC WITH DIFFERENTIAL/PLATELET
Basophils Absolute: 0 10*3/uL (ref 0.0–0.1)
Basophils Relative: 0.7 % (ref 0.0–3.0)
Eosinophils Absolute: 0.2 10*3/uL (ref 0.0–0.7)
Eosinophils Relative: 5.5 % — ABNORMAL HIGH (ref 0.0–5.0)
HCT: 26.3 % — ABNORMAL LOW (ref 39.0–52.0)
Hemoglobin: 7.9 g/dL — CL (ref 13.0–17.0)
Lymphocytes Relative: 24.6 % (ref 12.0–46.0)
Lymphs Abs: 1 10*3/uL (ref 0.7–4.0)
MCHC: 30 g/dL (ref 30.0–36.0)
MCV: 65.1 fl — ABNORMAL LOW (ref 78.0–100.0)
Monocytes Absolute: 0.4 10*3/uL (ref 0.1–1.0)
Monocytes Relative: 10.5 % (ref 3.0–12.0)
Neutro Abs: 2.4 10*3/uL (ref 1.4–7.7)
Neutrophils Relative %: 58.7 % (ref 43.0–77.0)
Platelets: 308 10*3/uL (ref 150.0–400.0)
RBC: 4.04 Mil/uL — ABNORMAL LOW (ref 4.22–5.81)
RDW: 19 % — ABNORMAL HIGH (ref 11.5–15.5)
WBC: 4.1 10*3/uL (ref 4.0–10.5)

## 2020-07-07 LAB — TSH: TSH: 0.7 u[IU]/mL (ref 0.35–4.50)

## 2020-07-07 MED ORDER — SODIUM CHLORIDE 0.9 % IV SOLN: 500.0000 mL | Freq: Once | INTRAVENOUS | Status: DC

## 2020-07-07 NOTE — Progress Notes (Signed)
To PACU, VSS. Report to [email or web address] 

## 2020-07-07 NOTE — Patient Instructions (Signed)
Handouts given;  GERD, esophagitis and stricture, Post dilation diet FOLLOW DILATION DIET TODAY CLEAR LIQUID DIET FOR 2 HOURS, THEN SOFT DIET FOR THE REST OF TODAY Resume prior diet tomorrow  Continue present medications Return to GI office in 2 months  YOU HAD AN ENDOSCOPIC PROCEDURE TODAY AT [facility]:   Refer to the procedure report that was given to you for any specific questions about what was found during the examination.  If the procedure report does not answer your questions, please call your gastroenterologist to clarify.  If you requested that your care partner not be given the details of your procedure findings, then the procedure report has been included in a sealed envelope for you to review at your convenience later.  YOU SHOULD EXPECT: Some feelings of bloating in the abdomen. Passage of more gas than usual.  Walking can help get rid of the air that was put into your GI tract during the procedure and reduce the bloating. If you had a lower endoscopy (such as a colonoscopy or flexible sigmoidoscopy) you may notice spotting of blood in your stool or on the toilet paper. If you underwent a bowel prep for your procedure, you may not have a normal bowel movement for a few days.  Please Note:  You might notice some irritation and congestion in your nose or some drainage.  This is from the oxygen used during your procedure.  There is no need for concern and it should clear up in a day or so.  SYMPTOMS TO REPORT IMMEDIATELY:  F   Following upper endoscopy (EGD)  Vomiting of blood or coffee ground material  New chest pain or pain under the shoulder blades  Painful or persistently difficult swallowing  New shortness of breath  Fever of 100F or higher  Black, tarry-looking stools  For urgent or emergent issues, a gastroenterologist can be reached at any hour by calling [phone]. Do not use MyChart messaging for urgent concerns.    DIET:  We do recommend a  small meal at first, but then you may proceed to your regular diet.  Drink plenty of fluids but you should avoid alcoholic beverages for 24 hours.  ACTIVITY:  You should plan to take it easy for the rest of today and you should NOT DRIVE or use heavy machinery until tomorrow (because of the sedation medicines used during the test).    FOLLOW UP: Our staff will call the number listed on your records 48-72 hours following your procedure to check on you and address any questions or concerns that you may have regarding the information given to you following your procedure. If we do not reach you, we will leave a message.  We will attempt to reach you two times.  During this call, we will ask if you have developed any symptoms of COVID 19. If you develop any symptoms (ie: fever, flu-like symptoms, shortness of breath, cough etc.) before then, please call [phone].  If you test positive for Covid 19 in the 2 weeks post procedure, please call and report this information to [place].    If any biopsies were taken you will be contacted by phone or by letter within the next 1-3 weeks.  Please call us at [phone] if you have not heard about the biopsies in 3 weeks.    SIGNATURES/CONFIDENTIALITY: You and/or your care partner have signed paperwork which will be entered into your electronic medical record.  These signatures attest to the fact  that that the information above on your After Visit Summary has been reviewed and is understood.  Full responsibility of the confidentiality of this discharge information lies with you and/or your care-partner.

## 2020-07-07 NOTE — Op Note (Signed)
[name] Procedure Date: 07/07/2020 10:22 AM MRN: [id number] Endoscopist: Ladene Artist , MD Age: 39 Referring MD:  Date of Birth: Jul 13, 1981 Gender: Male Account #: [id number] Procedure:                Upper GI endoscopy Indications:              Therapeutic procedure for esophageal dilation,                            Dysphagia Medicines:                Monitored Anesthesia Care Procedure:                Pre-Anesthesia Assessment:                           - Prior to the procedure, a History and Physical                            was performed, and patient medications and                            allergies were reviewed. The patient's tolerance of                            previous anesthesia was also reviewed. The risks                            and benefits of the procedure and the sedation                            options and risks were discussed with the patient.                            All questions were answered, and informed consent                            was obtained. Prior Anticoagulants: The patient has                            taken no previous anticoagulant or antiplatelet                            agents. ASA Grade Assessment: II - A patient with                            mild systemic disease. After reviewing the risks                            and benefits, the patient was deemed in                            satisfactory condition to undergo the procedure.  After obtaining informed consent, the endoscope was                            passed under direct vision. Throughout the                            procedure, the patient's blood pressure, pulse, and                            oxygen saturations were monitored continuously. The                            Endoscope was introduced through the mouth, and                            advanced to the second part of duodenum. The upper                             GI endoscopy was accomplished without difficulty.                            The patient tolerated the procedure well. Scope In: Scope Out: Findings:                 There were esophageal mucosal changes secondary to                            established long-segment Barrett's disease present                            in the mid esophagus and in the distal esophagus.                            The maximum longitudinal extent of these mucosal                            changes was 10 cm in length.                           Three cratered esophageal ulcers with no bleeding                            and no stigmata of recent bleeding were found 31 to                            36 cm from the incisors. The largest lesion was 10                            mm in largest dimension.                           LA Grade C (one or more mucosal breaks continuous  between tops of 2 or more mucosal folds, less than                            75% circumference) esophagitis with no bleeding was                            found in the proximal esophagus.                           One benign-appearing, intrinsic moderate stenosis                            was found 26 cm from the incisors. This stenosis                            measured 1.2 cm (inner diameter) x less than one cm                            (in length). The stenosis was traversed. A                            guidewire was placed and the scope was withdrawn.                            Dilations were performed with Savary dilators with                            mild resistance at 14 mm, 15 mm. Heme on both                            dilators.                           The exam of the esophagus was otherwise normal.                           A medium-sized hiatal hernia was present.                           The exam of the stomach was otherwise normal.                           The  duodenal bulb and second portion of the                            duodenum were normal. Complications:            No immediate complications. Estimated Blood Loss:     Estimated blood loss was minimal. Impression:               - Esophageal mucosal changes secondary to                            established long-segment Barrett's disease.                           -  LA Class C esophagitis.                           - Esophageal ulcers with no bleeding and no                            stigmata of recent bleeding.                           - Benign-appearing esophageal stenosis. Dilated.                           - Medium-sized hiatal hernia.                           - Normal duodenal bulb and second portion of the                            duodenum.                           - No specimens collected. Recommendation:           - Patient has a contact number available for                            emergencies. The signs and symptoms of potential                            delayed complications were discussed with the                            patient. Return to normal activities tomorrow.                            Written discharge instructions were provided to the                            patient.                           - Clear liquid diet for 2 hours, then advance as                            tolerated to soft diet today.                           - Resume prior diet tomorrow.                           - Continue present medications.                           - CBC, CMP, TSH today.                           - Return to GI office in 2 months. Pricilla Riffle  Fuller Plan, MD 07/07/2020 10:42:58 AM This report has been signed electronically.

## 2020-07-07 NOTE — Progress Notes (Signed)
Pt's states no medical or surgical changes since previsit or office visit. 

## 2020-07-07 NOTE — Progress Notes (Signed)
Called to room to assist during endoscopic procedure.  Patient ID and intended procedure confirmed with present staff. Received instructions for my participation in the procedure from the performing physician.  

## 2020-07-08 ENCOUNTER — Other Ambulatory Visit: Payer: Self-pay

## 2020-07-08 DIAGNOSIS — D509 Iron deficiency anemia, unspecified: Secondary | ICD-10-CM

## 2020-07-08 DIAGNOSIS — D500 Iron deficiency anemia secondary to blood loss (chronic): Secondary | ICD-10-CM

## 2020-07-08 NOTE — Telephone Encounter (Signed)
Entered in error

## 2020-07-09 ENCOUNTER — Telehealth: Payer: Self-pay

## 2020-07-09 NOTE — Telephone Encounter (Signed)
  Follow up Call-  Call back number 07/07/2020 06/01/2020 04/26/2020 12/30/2019 11/27/2019 10/06/2019 04/08/2019  Post procedure Call Back phone  # [phone]  Permission to leave phone message Yes Yes Yes Yes Yes Yes Yes  Some recent data might be hidden     Patient questions:  Do you have a fever, pain , or abdominal swelling? No. Pain Score  0 *  Have you tolerated food without any problems? Yes.    Have you been able to return to your normal activities? Yes.    Do you have any questions about your discharge instructions: Diet   No. Medications  No. Follow up visit  No.  Do you have questions or concerns about your Care? No.  Actions: * If pain score is 4 or above: No action needed, pain <4.   1. Have you developed a fever since your procedure? no  2.   Have you had an respiratory symptoms (SOB or cough) since your procedure? no  3.   Have you tested positive for COVID 19 since your procedure no  4.   Have you had any family members/close contacts diagnosed with the COVID 19 since your procedure?  no   If yes to any of these questions please route to Joylene John, RN and Joella Prince, RN

## 2020-07-10 ENCOUNTER — Other Ambulatory Visit: Payer: Self-pay | Admitting: Gastroenterology

## 2020-07-15 ENCOUNTER — Telehealth: Payer: Self-pay | Admitting: Physician Assistant

## 2020-07-15 NOTE — Telephone Encounter (Signed)
Received a new hem referral from Dr. Fuller Plan for IDA. Pt returned my call and has been scheduled to see Cassie on 10/6 at 1;30pm w/labs at 1pm. Pt aware to arrive 30 minutes early.

## 2020-07-19 NOTE — Progress Notes (Signed)
[place] [phone]   [phone]  CONSULT NOTE  REFERRING PHYSICIAN: Dr. Fuller Plan  REASON FOR CONSULTATION:  Iron Deficiency Anemia  HPI Eric Hood is a 39 y.o. male the past medical history significant for Barrett's esophagitis, respiratory failure, aspiration pneumonia, GERD, esophagitis, esophageal stricture, dysphagia, acute encephalopathy, scaphoid fracture, thoracic compression fracture, narcotic overdose, methadone dependence, and anemia is referred to the clinic for evaluation of iron deficiency anemia.  Per chart review, it appears that the patient has been anemic since 2016.  The patient is followed closely by gastroenterology, Dr. Fuller Plan, for his Barrett's esophagitis.  The patient had recently been endorsing fatigue and he had a repeat CBC performed which noted significant microcytic anemia with a hemoglobin of 7.9 and MCV of 65.1.  The patient was subsequently referred to the clinic today for consideration of IV iron infusions.  The patient is unable take oral iron supplements due to his history of esophagitis and esophageal stricture and the concern that it will exacerbate that issue. He has tried taking 65 mg OTC iron supplements without any reported side effects.  Overall, the patient endorses fatigue and lightheadedness with standing.  He recently had an endoscopy performed for his Barrett's esophagitis which noted 3 esophageal ulcers without evidence of bleeding.  He has endoscopies performed multiple times this year. The patient's last colonoscopy was in 2016 and was normal.  The patient received IV iron in the past with feraheme per chart review and tolerated it well. His last iron infusion was on 11/27/19. He denies any particular dietary habits such as being a vegan or vegetarian.  He denies any nose bleeding, hemoptysis, hematuria, melena, or hematochezia to his knowledge.  He also needed blood transfusions in the past for his anemia  during prior hospitalizations. He denies significant abdominal pain except he endorses RUQ pain when he is anemic. He also notes LUQ abdominal cramp this AM.  Denies nausea, vomiting, diarrhea, or constipation. He has lost a significant amount of weight over the last few weeks due to his difficulty swallowing and feeling like food is getting "stuck".  He reports hemorrhoids. He denies any blood thinner or NSAID use.  He denies any fever, chills, night sweats, or lymphadenopathy.  He denies any chest pain, or palpitations. He denies craving ice or starch.  The patient's family consists of a father who reportedly had hypertension.  The patient not know his mother's medical history.  The patient denies any family history of colorectal cancer, anemia, or bone marrow abnormalities.  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  He is divorced and has 1 child.  Denies any current alcohol use but reports that he used to drink 1/5 of vodka per day approximately 10 years ago.  He denies any history of tobacco use.  She denies any illicit drug use besides pain medicines that he was prescribed for his back pain due to fractures.   HPI  Past Medical History:  Diagnosis Date  . Anemia 10/23/2014  . Anxiety   . Back pain   . Blood transfusion without reported diagnosis april 2016/feb 2020  . Breathing difficulty 12/13/2019   went to ER and felt unable to breathe  . Esophageal stricture 03/2014  . Esophageal ulcer   . GERD (gastroesophageal reflux disease)   . Hiatal hernia   . Reflux   . Substance abuse ([place])    on methadone  . 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 BALLOON DILATION N/A 05/20/2015   Procedure: BALLOON DILATION;  Surgeon: Milus Banister, MD;  Location: [facility];  Service: Endoscopy;  Laterality: N/A;  . COLONOSCOPY  2018  . ESOPHAGEAL DILATION     On 3 occasions: 03/2014, 08/2014, 09/2014  . ESOPHAGOGASTRODUODENOSCOPY  N/A 05/20/2015   Procedure: ESOPHAGOGASTRODUODENOSCOPY (EGD);  Surgeon: Milus Banister, MD;  Location: [facility];  Service: Endoscopy;  Laterality: N/A;  . ESOPHAGOGASTRODUODENOSCOPY (EGD) WITH PROPOFOL N/A 01/18/2015   Procedure: ESOPHAGOGASTRODUODENOSCOPY (EGD) WITH PROPOFOL;  Surgeon: Lafayette Dragon, MD;  Location: [facility];  Service: Endoscopy;  Laterality: N/A;  . ESOPHAGOGASTRODUODENOSCOPY (EGD) WITH PROPOFOL N/A 09/04/2017   Procedure: ESOPHAGOGASTRODUODENOSCOPY (EGD) WITH PROPOFOL;  Surgeon: Ladene Artist, MD;  Location: [facility];  Service: Endoscopy;  Laterality: N/A;  need fluro  . ESOPHAGOGASTRODUODENOSCOPY (EGD) WITH PROPOFOL N/A 10/01/2017   Procedure: ESOPHAGOGASTRODUODENOSCOPY (EGD) WITH PROPOFOL;  Surgeon: Ladene Artist, MD;  Location: [facility];  Service: Endoscopy;  Laterality: N/A;  . HAND SURGERY Right   . SAVORY DILATION N/A 09/04/2017   Procedure: SAVORY DILATION;  Surgeon: Ladene Artist, MD;  Location: [facility];  Service: Endoscopy;  Laterality: N/A;  . SAVORY DILATION N/A 10/01/2017   Procedure: SAVORY DILATION;  Surgeon: Ladene Artist, MD;  Location: [facility];  Service: Endoscopy;  Laterality: N/A;  . UPPER GASTROINTESTINAL ENDOSCOPY  11/27/2019    Family History  Problem Relation Age of Onset  . Hypertension Mother   . Hypertension Father   . Colon cancer Neg Hx   . Esophageal cancer Neg Hx   . Rectal cancer Neg Hx   . Stomach cancer Neg Hx   . Colon polyps Neg Hx   . Liver cancer Neg Hx   . Pancreatic cancer Neg Hx     Social History Social History   Tobacco Use  . Smoking status: Never Smoker  . Smokeless tobacco: Never Used  Vaping Use  . Vaping Use: Never used  Substance Use Topics  . Alcohol use: Not Currently  . Drug use: Not Currently    Types: Heroin    Comment: currently taking Methadone    Allergies  Allergen Reactions  . Atarax [Hydroxyzine] Anaphylaxis  . Hydrocodone Nausea And Vomiting    Current  Outpatient Medications  Medication Sig Dispense Refill  . famotidine (PEPCID) 20 MG tablet TAKE 1 TABLET(20 MG) BY MOUTH AT BEDTIME 90 tablet 1  . methadone (DOLOPHINE) 10 MG/ML solution Take 65 mg by mouth daily.     . pantoprazole (PROTONIX) 40 MG tablet TAKE 1 TABLET(40 MG) BY MOUTH TWICE DAILY BEFORE A MEAL (Patient taking differently: Take 40 mg by mouth 2 (two) times daily. ) 60 tablet 5  . promethazine (PHENERGAN) 12.5 MG tablet TAKE 1 TABLET(12.5 MG) BY MOUTH THREE TIMES DAILY AS NEEDED FOR NAUSEA OR VOMITING 60 tablet 2   No current facility-administered medications for this visit.    Review of Systems  Review of Systems  Constitutional: Positive for fatigue. Negative for appetite change, chills, fever and unexpected weight change.  HENT: Negative for mouth sores, nosebleeds, sore throat and trouble swallowing.   Eyes: Negative for eye problems and icterus.  Respiratory: Negative for cough, hemoptysis, shortness of breath and wheezing.   Cardiovascular: Negative for chest pain and leg swelling.  Gastrointestinal: Negative for abdominal pain, constipation, diarrhea, nausea and vomiting.  Genitourinary: Negative for bladder incontinence, difficulty urinating, dysuria, frequency and hematuria.   Musculoskeletal: Negative for back pain,  gait problem, neck pain and neck stiffness.  Skin: Negative for itching and rash.  Neurological: Positive for lightheadedness. Negative for dizziness, extremity weakness, gait problem, headaches, and seizures.  Hematological: Negative for adenopathy. Does not bruise/bleed easily.  Psychiatric/Behavioral: Negative for confusion, depression and sleep disturbance. The patient is not nervous/anxious.     PHYSICAL EXAMINATION:  Blood pressure 108/69, pulse 87, temperature 98.8 F (37.1 C), temperature source Tympanic, resp. rate 18, height 5\' 11"  (1.803 m), weight 168 lb 1.6 oz (76.2 kg), SpO2 100 %.  ECOG PERFORMANCE STATUS: 1  Physical Exam    Constitutional: Oriented to person, place, and time and well-developed, well-nourished, and in no distress.  HENT:  Head: Normocephalic and atraumatic.  Mouth/Throat: Oropharynx is clear and moist. No oropharyngeal exudate.  Eyes: Conjunctivae are normal. Right eye exhibits no discharge. Left eye exhibits no discharge. No scleral icterus.  Neck: Normal range of motion. Neck supple.  Cardiovascular: Normal rate, regular rhythm, normal heart sounds and intact distal pulses.  Pulmonary/Chest: Effort normal and breath sounds normal. No respiratory distress. No wheezes. No rales.  Abdominal: Soft. Bowel sounds are normal. Exhibits no distension and no mass. There is no tenderness.  Musculoskeletal: Normal range of motion. Exhibits no edema.  Lymphadenopathy:    No cervical adenopathy.  Neurological: Alert and oriented to person, place, and time. Exhibits normal muscle tone. Gait normal. Coordination normal.  Skin: Positive for skin pallor. skin is warm and dry. No rash noted. Not diaphoretic. No erythema.  Psychiatric: Mood, memory and judgment normal.  Vitals reviewed.  LABORATORY DATA: Lab Results  Component Value Date   WBC 4.2 07/21/2020   HGB 9.0 (L) 07/21/2020   HCT 32.4 (L) 07/21/2020   MCV 66.8 (L) 07/21/2020   PLT 446 (H) 07/21/2020      Chemistry      Component Value Date/Time   NA 136 07/21/2020 1259   K 3.7 07/21/2020 1259   CL 101 07/21/2020 1259   CO2 28 07/21/2020 1259   BUN 11 07/21/2020 1259   CREATININE 1.24 07/21/2020 1259      Component Value Date/Time   CALCIUM 9.3 07/21/2020 1259   ALKPHOS 81 07/21/2020 1259   AST 13 (L) 07/21/2020 1259   ALT 10 07/21/2020 1259   BILITOT 0.4 07/21/2020 1259       RADIOGRAPHIC STUDIES: No results found.  ASSESSMENT: This is a very pleasant 39 year old Caucasian male referred to the clinic for iron deficiency anemia likely secondary to blood loss from upper GI tract.  PLAN: Patient seen with Dr. Julien Nordmann.  The  patient had a repeat CBC, CMP, iron studies, ferritin, B12, and folate performed.  The patient CBC demonstrated microcytic anemia with a hemoglobin of 9.0.  His platelets were elevated.  His CMP with was unremarkable.  The patient's iron studies show iron deficiency.  His vitamin B12 and folate are still pending at this time.  We will arrange for the patient to have IV iron infusions with Feraheme weekly x2.  We will see the patient back for follow-up visit in 6 weeks for evaluation and repeat CBC, iron studies, and ferritin.  The patient voices understanding of current disease status and treatment options and is in agreement with the current care plan.  All questions were answered. The patient knows to call the clinic with any problems, questions or concerns. We can certainly see the patient much sooner if necessary.  Thank you so much for allowing me to participate in the care of Eric March  Hood. I will continue to follow up the patient with you and assist in his care.  The total time spent in the appointment was 60 minutes.  Disclaimer: This note was dictated with voice recognition software. Similar sounding words can inadvertently be transcribed and may not be corrected upon review.   Dorrell Mitcheltree L Corita Allinson July 21, 2020, 2:30 PM  ADDENDUM: Hematology/Oncology Attending: I had a face-to-face encounter with the patient today.  I recommended his care plan.  This is a very pleasant 39 years old white male with chronic microcytic anemia secondary to gastrointestinal blood loss in a patient with history of Barrett's esophagus and ulcers.  The patient was seen by gastroenterology and he had upper endoscopy by Dr. Fuller Plan several times. He is currently on treatment with proton pump inhibitor.  He has intolerance to the oral iron tablet and he was advised by his gastroenterologist to avoid oral iron because of concern about worsening of his gastrointestinal symptoms and ulceration. The  patient was referred to [place] for evaluation and consideration of IV iron infusion. Repeat CBC today showed hemoglobin of 9.0 with ferritin level of 32.4% and MCV of 66.8%.  Serum ferritin was 5.  Serum iron was 11 and iron saturation 3%. I recommended for the patient to proceed with iron infusion with Feraheme 510 mg IV weekly for 2 weeks. We will evaluate the patient back for follow-up visit in 6 weeks with repeat CBC, iron study and ferritin. The patient agreed to the current plan. He was advised to call immediately if he has any concerning symptoms in the interval.  Disclaimer: This note was dictated with voice recognition software. Similar sounding words can inadvertently be transcribed and may be missed upon review. Eilleen Kempf, MD 07/21/20

## 2020-07-20 ENCOUNTER — Other Ambulatory Visit: Payer: Self-pay | Admitting: Physician Assistant

## 2020-07-20 DIAGNOSIS — D500 Iron deficiency anemia secondary to blood loss (chronic): Secondary | ICD-10-CM

## 2020-07-21 ENCOUNTER — Inpatient Hospital Stay: Payer: Self-pay

## 2020-07-21 ENCOUNTER — Inpatient Hospital Stay: Payer: Self-pay | Attending: Physician Assistant | Admitting: Physician Assistant

## 2020-07-21 ENCOUNTER — Other Ambulatory Visit: Payer: Self-pay

## 2020-07-21 ENCOUNTER — Encounter: Payer: Self-pay | Admitting: Physician Assistant

## 2020-07-21 VITALS — BP 108/69 | HR 87 | Temp 98.8°F | Resp 18 | Ht 71.0 in | Wt 168.1 lb

## 2020-07-21 DIAGNOSIS — K2270 Barrett's esophagus without dysplasia: Secondary | ICD-10-CM | POA: Insufficient documentation

## 2020-07-21 DIAGNOSIS — D509 Iron deficiency anemia, unspecified: Secondary | ICD-10-CM | POA: Insufficient documentation

## 2020-07-21 DIAGNOSIS — F1120 Opioid dependence, uncomplicated: Secondary | ICD-10-CM | POA: Insufficient documentation

## 2020-07-21 DIAGNOSIS — D500 Iron deficiency anemia secondary to blood loss (chronic): Secondary | ICD-10-CM

## 2020-07-21 DIAGNOSIS — Z8719 Personal history of other diseases of the digestive system: Secondary | ICD-10-CM | POA: Insufficient documentation

## 2020-07-21 DIAGNOSIS — K922 Gastrointestinal hemorrhage, unspecified: Secondary | ICD-10-CM

## 2020-07-21 LAB — CBC WITH DIFFERENTIAL (CANCER CENTER ONLY)
Abs Immature Granulocytes: 0.02 10*3/uL (ref 0.00–0.07)
Basophils Absolute: 0 10*3/uL (ref 0.0–0.1)
Basophils Relative: 1 %
Eosinophils Absolute: 0.1 10*3/uL (ref 0.0–0.5)
Eosinophils Relative: 1 %
HCT: 32.4 % — ABNORMAL LOW (ref 39.0–52.0)
Hemoglobin: 9 g/dL — ABNORMAL LOW (ref 13.0–17.0)
Immature Granulocytes: 1 %
Lymphocytes Relative: 23 %
Lymphs Abs: 1 10*3/uL (ref 0.7–4.0)
MCH: 18.6 pg — ABNORMAL LOW (ref 26.0–34.0)
MCHC: 27.8 g/dL — ABNORMAL LOW (ref 30.0–36.0)
MCV: 66.8 fL — ABNORMAL LOW (ref 80.0–100.0)
Monocytes Absolute: 0.3 10*3/uL (ref 0.1–1.0)
Monocytes Relative: 8 %
Neutro Abs: 2.8 10*3/uL (ref 1.7–7.7)
Neutrophils Relative %: 66 %
Platelet Count: 446 10*3/uL — ABNORMAL HIGH (ref 150–400)
RBC: 4.85 MIL/uL (ref 4.22–5.81)
RDW: 19.5 % — ABNORMAL HIGH (ref 11.5–15.5)
WBC Count: 4.2 10*3/uL (ref 4.0–10.5)
nRBC: 0 % (ref 0.0–0.2)

## 2020-07-21 LAB — CMP (CANCER CENTER ONLY)
ALT: 10 U/L (ref 0–44)
AST: 13 U/L — ABNORMAL LOW (ref 15–41)
Albumin: 3.9 g/dL (ref 3.5–5.0)
Alkaline Phosphatase: 81 U/L (ref 38–126)
Anion gap: 7 (ref 5–15)
BUN: 11 mg/dL (ref 6–20)
CO2: 28 mmol/L (ref 22–32)
Calcium: 9.3 mg/dL (ref 8.9–10.3)
Chloride: 101 mmol/L (ref 98–111)
Creatinine: 1.24 mg/dL (ref 0.61–1.24)
GFR, Estimated: >60 mL/min (ref >60)
Glucose, Bld: 95 mg/dL (ref 70–99)
Potassium: 3.7 mmol/L (ref 3.5–5.1)
Sodium: 136 mmol/L (ref 135–145)
Total Bilirubin: 0.4 mg/dL (ref 0.3–1.2)
Total Protein: 7.4 g/dL (ref 6.5–8.1)

## 2020-07-21 LAB — IRON AND TIBC
Iron: 11 ug/dL — ABNORMAL LOW (ref 42–163)
Saturation Ratios: 3 % — ABNORMAL LOW (ref 20–55)
TIBC: 411 ug/dL — ABNORMAL HIGH (ref 202–409)
UIBC: 400 ug/dL — ABNORMAL HIGH (ref 117–376)

## 2020-07-21 LAB — VITAMIN B12: Vitamin B-12: 303 pg/mL (ref 180–914)

## 2020-07-21 LAB — FOLATE: Folate: 16 ng/mL (ref >5.9)

## 2020-07-21 LAB — FERRITIN: Ferritin: 5 ng/mL — ABNORMAL LOW (ref 24–336)

## 2020-07-30 ENCOUNTER — Inpatient Hospital Stay: Payer: Self-pay

## 2020-08-06 ENCOUNTER — Inpatient Hospital Stay: Payer: Self-pay

## 2020-09-01 ENCOUNTER — Inpatient Hospital Stay: Payer: Self-pay

## 2020-09-01 ENCOUNTER — Inpatient Hospital Stay: Payer: Self-pay | Attending: Physician Assistant | Admitting: Physician Assistant

## 2020-09-06 ENCOUNTER — Telehealth: Payer: Self-pay | Admitting: Hematology and Oncology

## 2020-09-06 NOTE — Telephone Encounter (Signed)
Scheduled appts per 11/22 staff msg. Left voicemail with appt dates and times.

## 2020-09-07 ENCOUNTER — Ambulatory Visit: Payer: Self-pay

## 2020-09-11 ENCOUNTER — Inpatient Hospital Stay: Payer: Self-pay

## 2020-09-11 ENCOUNTER — Telehealth: Payer: Self-pay

## 2020-09-11 NOTE — Telephone Encounter (Signed)
LVM for patient that he had an appt at 0830 today and we have not heard from him. Let him know if he can be here by 1230 we can take him but if it is not in the next 30 minutes he would have to reschedule.

## 2020-09-17 ENCOUNTER — Inpatient Hospital Stay: Payer: Self-pay | Attending: Physician Assistant

## 2020-09-24 ENCOUNTER — Ambulatory Visit: Payer: Self-pay

## 2020-10-01 ENCOUNTER — Telehealth: Payer: Self-pay | Admitting: Gastroenterology

## 2020-10-01 MED ORDER — PANTOPRAZOLE SODIUM 40 MG PO TBEC: DELAYED_RELEASE_TABLET | ORAL | 5 refills | Status: DC

## 2020-10-01 NOTE — Telephone Encounter (Signed)
Pantoprazole refilled as requested, [place] up to date on his office visits.

## 2020-10-01 NOTE — Telephone Encounter (Signed)
Pt is requesting a refill on his pantoprazole.

## 2020-10-06 ENCOUNTER — Ambulatory Visit (INDEPENDENT_AMBULATORY_CARE_PROVIDER_SITE_OTHER): Payer: Self-pay | Admitting: Gastroenterology

## 2020-10-06 ENCOUNTER — Encounter: Payer: Self-pay | Admitting: Gastroenterology

## 2020-10-06 VITALS — BP 116/70 | HR 92 | Ht 71.0 in | Wt 174.2 lb

## 2020-10-06 DIAGNOSIS — D509 Iron deficiency anemia, unspecified: Secondary | ICD-10-CM

## 2020-10-06 DIAGNOSIS — K2270 Barrett's esophagus without dysplasia: Secondary | ICD-10-CM

## 2020-10-06 DIAGNOSIS — K2100 Gastro-esophageal reflux disease with esophagitis, without bleeding: Secondary | ICD-10-CM

## 2020-10-06 DIAGNOSIS — K222 Esophageal obstruction: Secondary | ICD-10-CM

## 2020-10-06 NOTE — Progress Notes (Signed)
    History of Present Illness: This is a 39 year old male returning for follow-up of esophageal stricture.  He relates that since his last dilation he is not had difficulties with solid food dysphagia.  Occasionally he notes a slight discomfort when swallowing certain foods but the symptoms are transient.  He is found a less expensive way to obtain his prescription medications so I think his medication compliance has improved.  Most recent EGD below  EGD 06/2020 - Esophageal mucosal changes secondary to established long-segment Barrett's disease. - LA Class C esophagitis. - Esophageal ulcers with no bleeding and no stigmata of recent bleeding. - Benign-appearing esophageal stenosis. Dilated to 15 mm. - Medium-sized hiatal hernia. - Normal duodenal bulb and second portion of the duodenum. - No specimens collected.  Current Medications, Allergies, Past Medical History, Past Surgical History, Family History and Social History were reviewed in [employer] record.   Physical Exam: General: Well developed, well nourished, no acute distress Head: Normocephalic and atraumatic Eyes:  sclerae anicteric, EOMI Ears: Normal auditory acuity Mouth: Not examined, mask on during Covid-19 pandemic Lungs: Clear throughout to auscultation Heart: Regular rate and rhythm; no murmurs, rubs or bruits Abdomen: Soft, non tender and non distended. No masses, hepatosplenomegaly or hernias noted. Normal Bowel sounds Rectal: Not done Musculoskeletal: Symmetrical with no gross deformities  Pulses:  Normal pulses noted Extremities: No clubbing, cyanosis, edema or deformities noted Neurological: Alert oriented x 4, grossly nonfocal Psychological:  Alert and cooperative. Normal mood and affect   Assessment and Recommendations:  1. GERD with esophagitis, esophageal ulcer, esophageal stricture, long segment Barrett's esophagus without dysplasia.  Continue pantoprazole 40 mg twice daily and  famotidine 40 mg at bedtime.  The importance of ongoing compliance with prescribed medications was again stressed.  Closely follow antireflux measures.  Contact us if reflux symptoms are not well controlled or if dysphagia recurs.  Surveillance EGD for Barrett's esophagus in December 2022.  REV in 6 months.  2.  Iron deficiency anemia.  Ongoing follow-up with hematology.  Oral iron has been difficult to take due to his esophageal stricture.  He has been treated with Feraheme.

## 2020-10-06 NOTE — Patient Instructions (Signed)
Please remain on your pantoprazole 40 mg twice a day and and famotidine at bedtime  Please follow up in the office in 6 months   Due to recent changes in healthcare laws, you may see the results of your imaging and laboratory studies on MyChart before your provider has had a chance to review them.  We understand that in some cases there may be results that are confusing or concerning to you. Not all laboratory results come back in the same time frame and the provider may be waiting for multiple results in order to interpret others.  Please give [place] 48 hours in order for your provider to thoroughly review all the results before contacting the office for clarification of your results.

## 2021-03-11 ENCOUNTER — Emergency Department (HOSPITAL_COMMUNITY): Payer: Self-pay

## 2021-03-11 ENCOUNTER — Encounter (HOSPITAL_COMMUNITY): Payer: Self-pay | Admitting: Emergency Medicine

## 2021-03-11 ENCOUNTER — Emergency Department (HOSPITAL_COMMUNITY)
Admission: EM | Admit: 2021-03-11 | Discharge: 2021-03-11 | Disposition: A | Discharge status: Home / Self Care | Payer: Self-pay | Attending: Emergency Medicine | Admitting: Emergency Medicine

## 2021-03-11 ENCOUNTER — Other Ambulatory Visit: Payer: Self-pay

## 2021-03-11 DIAGNOSIS — J4551 Severe persistent asthma with (acute) exacerbation: Secondary | ICD-10-CM | POA: Insufficient documentation

## 2021-03-11 DIAGNOSIS — Z20822 Contact with and (suspected) exposure to covid-19: Secondary | ICD-10-CM | POA: Insufficient documentation

## 2021-03-11 DIAGNOSIS — Z28310 Unvaccinated for covid-19: Secondary | ICD-10-CM | POA: Insufficient documentation

## 2021-03-11 DIAGNOSIS — D509 Iron deficiency anemia, unspecified: Secondary | ICD-10-CM | POA: Insufficient documentation

## 2021-03-11 DIAGNOSIS — K2100 Gastro-esophageal reflux disease with esophagitis, without bleeding: Secondary | ICD-10-CM | POA: Insufficient documentation

## 2021-03-11 LAB — CBC WITH DIFFERENTIAL/PLATELET
Abs Immature Granulocytes: 0.04 10*3/uL (ref 0.00–0.07)
Basophils Absolute: 0 10*3/uL (ref 0.0–0.1)
Basophils Relative: 0 %
Eosinophils Absolute: 0.1 10*3/uL (ref 0.0–0.5)
Eosinophils Relative: 1 %
HCT: 29.8 % — ABNORMAL LOW (ref 39.0–52.0)
Hemoglobin: 8.2 g/dL — ABNORMAL LOW (ref 13.0–17.0)
Immature Granulocytes: 0 %
Lymphocytes Relative: 9 %
Lymphs Abs: 0.9 10*3/uL (ref 0.7–4.0)
MCH: 20.3 pg — ABNORMAL LOW (ref 26.0–34.0)
MCHC: 27.5 g/dL — ABNORMAL LOW (ref 30.0–36.0)
MCV: 73.8 fL — ABNORMAL LOW (ref 80.0–100.0)
Monocytes Absolute: 0.4 10*3/uL (ref 0.1–1.0)
Monocytes Relative: 4 %
Neutro Abs: 8.8 10*3/uL — ABNORMAL HIGH (ref 1.7–7.7)
Neutrophils Relative %: 86 %
Platelets: 423 10*3/uL — ABNORMAL HIGH (ref 150–400)
RBC: 4.04 MIL/uL — ABNORMAL LOW (ref 4.22–5.81)
RDW: 18.7 % — ABNORMAL HIGH (ref 11.5–15.5)
WBC: 10.2 10*3/uL (ref 4.0–10.5)
nRBC: 0 % (ref 0.0–0.2)

## 2021-03-11 LAB — BASIC METABOLIC PANEL
Anion gap: 5 (ref 5–15)
BUN: 15 mg/dL (ref 6–20)
CO2: 30 mmol/L (ref 22–32)
Calcium: 8.4 mg/dL — ABNORMAL LOW (ref 8.9–10.3)
Chloride: 102 mmol/L (ref 98–111)
Creatinine, Ser: 1.25 mg/dL — ABNORMAL HIGH (ref 0.61–1.24)
GFR, Estimated: >60 mL/min (ref >60)
Glucose, Bld: 201 mg/dL — ABNORMAL HIGH (ref 70–99)
Potassium: 4.2 mmol/L (ref 3.5–5.1)
Sodium: 137 mmol/L (ref 135–145)

## 2021-03-11 LAB — RESP PANEL BY RT-PCR (FLU A&B, COVID) ARPGX2
Influenza A by PCR: NEGATIVE
Influenza B by PCR: NEGATIVE
SARS Coronavirus 2 by RT PCR: NEGATIVE

## 2021-03-11 MED ORDER — ALBUTEROL SULFATE HFA 108 (90 BASE) MCG/ACT IN AERS: 2.0000 | INHALATION_SPRAY | RESPIRATORY_TRACT | 0 refills | Status: DC | PRN

## 2021-03-11 MED ORDER — PREDNISONE 50 MG PO TABS: 50.0000 mg | ORAL_TABLET | Freq: Every day | ORAL | 0 refills | Status: DC

## 2021-03-11 MED ORDER — IOHEXOL 300 MG/ML  SOLN
75.0000 mL | Freq: Once | INTRAMUSCULAR | Status: AC | PRN
  Administered 2021-03-11: 75 mL via INTRAVENOUS

## 2021-03-11 MED ORDER — IPRATROPIUM-ALBUTEROL 0.5-2.5 (3) MG/3ML IN SOLN
3.0000 mL | Freq: Once | RESPIRATORY_TRACT | Status: AC
  Administered 2021-03-11: 3 mL via RESPIRATORY_TRACT
  Filled 2021-03-11: qty 3

## 2021-03-11 MED ORDER — PREDNISONE 20 MG PO TABS
60.0000 mg | ORAL_TABLET | Freq: Once | ORAL | Status: AC
  Administered 2021-03-11: 60 mg via ORAL
  Filled 2021-03-11: qty 3

## 2021-03-11 NOTE — ED Provider Notes (Signed)
[facility] EMERGENCY DEPARTMENT Provider Note   CSN: [id number] Arrival date & time: 03/11/21  0309     History Chief Complaint  Patient presents with  . Shortness of Breath    Eric Hood is a 40 y.o. male.  The history is provided by the patient.  Shortness of Breath He has history of GERD, substance abuse and comes in with shortness of breath which started at 1 AM.  He has had a cough which is productive of a small amount of clear to yellow sputum.  He denies fever, chills, sweats.  There is slight amount of chest tightness in his chest but no heavy feeling or pressure feeling and no chest pain.  He denies any sick contacts.  He has not been vaccinated against COVID-19.  He has had similar episodes in the past.  He is a non-smoker.   Past Medical History:  Diagnosis Date  . Anemia 10/23/2014  . Anxiety   . Back pain   . Blood transfusion without reported diagnosis april 2016/feb 2020  . Breathing difficulty 12/13/2019   went to ER and felt unable to breathe  . Esophageal stricture 03/2014  . Esophageal ulcer   . GERD (gastroesophageal reflux disease)   . Hiatal hernia   . Reflux   . Substance abuse ([place])    on methadone  . Ulcerative esophagitis 03/2014   severe    Patient Active Problem List   Diagnosis Date Noted  . Iron deficiency anemia due to chronic blood loss 07/21/2020  . 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 Fever 11/19/2018  . Acute on chronic anemia 11/19/2018  . Dysphagia   . Thoracic compression fracture ([place]) 10/14/2015  . Trigger point of thoracic region 09/03/2015  . Acute lung injury   . Acute respiratory failure with hypoxia ([place])   . Respiratory distress   . Respiratory failure ([place]) 05/15/2015  . Acute respiratory failure with hypoxemia ([place])   . Aspiration pneumonia ([place])   . Acute encephalopathy   . Narcotic overdose ([place])   . Loss of weight 02/10/2015  . Scaphoid fracture of wrist 01/20/2015  . Acute  esophagitis   . Esophageal stricture   . Food impaction of esophagus   . Acute right lower quadrant pain 11/13/2014  . Anemia 10/23/2014  . Heme positive stool 10/23/2014  . GERD (gastroesophageal reflux disease) 10/23/2014  . Right-sided chest/RUQ pain 10/23/2014  . History of esophagitis and stricture 10/23/2014  . Anxiety state, unspecified 06/15/2014  . Scapular dysfunction 03/29/2014  . Nonallopathic lesion of thoracic region 03/29/2014  . Nonallopathic lesion of cervical region 03/29/2014  . Nonallopathic lesion-rib cage 03/29/2014  . Nonallopathic lesion of sacral region 03/29/2014  . Nonallopathic lesion of lumbosacral region 03/29/2014  . Dysplastic nevus of trunk 03/07/2013  . Thoracic back pain 02/11/2013    Past Surgical History:  Procedure Laterality Date  . BALLOON DILATION N/A 05/20/2015   Procedure: BALLOON DILATION;  Surgeon: Milus Banister, MD;  Location: [facility];  Service: Endoscopy;  Laterality: N/A;  . COLONOSCOPY  2018  . ESOPHAGEAL DILATION     On 3 occasions: 03/2014, 08/2014, 09/2014  . ESOPHAGOGASTRODUODENOSCOPY N/A 05/20/2015   Procedure: ESOPHAGOGASTRODUODENOSCOPY (EGD);  Surgeon: Milus Banister, MD;  Location: [facility];  Service: Endoscopy;  Laterality: N/A;  . ESOPHAGOGASTRODUODENOSCOPY (EGD) WITH PROPOFOL N/A 01/18/2015   Procedure: ESOPHAGOGASTRODUODENOSCOPY (EGD) WITH PROPOFOL;  Surgeon: Lafayette Dragon, MD;  Location: [facility];  Service: Endoscopy;  Laterality: N/A;  . ESOPHAGOGASTRODUODENOSCOPY (  EGD) WITH PROPOFOL N/A 09/04/2017   Procedure: ESOPHAGOGASTRODUODENOSCOPY (EGD) WITH PROPOFOL;  Surgeon: Ladene Artist, MD;  Location: [facility];  Service: Endoscopy;  Laterality: N/A;  need fluro  . ESOPHAGOGASTRODUODENOSCOPY (EGD) WITH PROPOFOL N/A 10/01/2017   Procedure: ESOPHAGOGASTRODUODENOSCOPY (EGD) WITH PROPOFOL;  Surgeon: Ladene Artist, MD;  Location: [facility];  Service: Endoscopy;  Laterality: N/A;  . HAND SURGERY Right   .  SAVORY DILATION N/A 09/04/2017   Procedure: SAVORY DILATION;  Surgeon: Ladene Artist, MD;  Location: [facility];  Service: Endoscopy;  Laterality: N/A;  . SAVORY DILATION N/A 10/01/2017   Procedure: SAVORY DILATION;  Surgeon: Ladene Artist, MD;  Location: [facility];  Service: Endoscopy;  Laterality: N/A;  . UPPER GASTROINTESTINAL ENDOSCOPY  11/27/2019       Family History  Problem Relation Age of Onset  . Hypertension Mother   . Hypertension Father   . Colon cancer Neg Hx   . Esophageal cancer Neg Hx   . Rectal cancer Neg Hx   . Stomach cancer Neg Hx   . Colon polyps Neg Hx   . Liver cancer Neg Hx   . Pancreatic cancer Neg Hx     Social History   Tobacco Use  . Smoking status: Never Smoker  . Smokeless tobacco: Never Used  Vaping Use  . Vaping Use: Never used  Substance Use Topics  . Alcohol use: Not Currently  . Drug use: Not Currently    Types: Heroin    Comment: currently taking Methadone    Home Medications Prior to Admission medications   Medication Sig Start Date End Date Taking? Authorizing Provider  famotidine (PEPCID) 20 MG tablet TAKE 1 TABLET(20 MG) BY MOUTH AT BEDTIME 04/29/20   Ladene Artist, MD  methadone (DOLOPHINE) 10 MG/ML solution Take 65 mg by mouth daily.     [provider]  pantoprazole (PROTONIX) 40 MG tablet TAKE 1 TABLET(40 MG) BY MOUTH TWICE DAILY BEFORE A MEAL 10/01/20   Ladene Artist, MD  promethazine (PHENERGAN) 12.5 MG tablet TAKE 1 TABLET(12.5 MG) BY MOUTH THREE TIMES DAILY AS NEEDED FOR NAUSEA OR VOMITING 07/12/20   Ladene Artist, MD    Allergies    Atarax [hydroxyzine] and Hydrocodone  Review of Systems   Review of Systems  Respiratory: Positive for shortness of breath.   All other systems reviewed and are negative.   Physical Exam Updated Vital Signs BP (!) 142/93 (BP Location: Right Arm)   Pulse (!) 114   Temp 97.7 F (36.5 C) (Oral)   Resp (!) 30   Ht 6' (1.829 m)   Wt 80 kg   SpO2 100%    BMI 23.92 kg/m   Physical Exam Vitals and nursing note reviewed.   40 year old male, resting comfortably and in no acute distress. Vital signs are significant for elevated heart rate, respiratory rate, blood pressure. Oxygen saturation is 100%, which is normal. Head is normocephalic and atraumatic. PERRLA, EOMI. Oropharynx is clear. Neck is nontender and supple without adenopathy or JVD. Back is nontender and there is no CVA tenderness. Lungs have diffuse inspiratory and expiratory wheezes without rales or rhonchi. Chest is nontender. Heart has regular rate and rhythm without murmur. Abdomen is soft, flat, nontender without masses or hepatosplenomegaly and peristalsis is normoactive. Extremities have no cyanosis or edema, full range of motion is present. Skin is warm and dry without rash. Neurologic: Mental status is normal, cranial nerves are intact, there are no motor or sensory  deficits.  ED Results / Procedures / Treatments   Labs (all labs ordered are listed, but only abnormal results are displayed) Labs Reviewed  BASIC METABOLIC PANEL - Abnormal; Notable for the following components:      Result Value   Glucose, Bld 201 (*)    Creatinine, Ser 1.25 (*)    Calcium 8.4 (*)    All other components within normal limits  CBC WITH DIFFERENTIAL/PLATELET - Abnormal; Notable for the following components:   RBC 4.04 (*)    Hemoglobin 8.2 (*)    HCT 29.8 (*)    MCV 73.8 (*)    MCH 20.3 (*)    MCHC 27.5 (*)    RDW 18.7 (*)    Platelets 423 (*)    Neutro Abs 8.8 (*)    All other components within normal limits  RESP PANEL BY RT-PCR (FLU A&B, COVID) [id number]    EKG EKG Interpretation  Date/Time:  Friday Mar 11 2021 03:12:47 EDT Ventricular Rate:  108 PR Interval:  161 QRS Duration: 91 QT Interval:  331 QTC Calculation: 444 R Axis:   81 Text Interpretation: Sinus tachycardia Minimal ST depression, inferior leads When compared with ECG of 12/13/2019, No significant change was  found Confirmed by Delora Fuel ([id number]) on 03/11/2021 3:14:38 AM   Radiology CT Chest W Contrast  Addendum Date: 03/11/2021   ADDENDUM REPORT: 03/11/2021 05:52 ADDENDUM: These results were called by telephone at the time of interpretation on 03/11/2021 at 5:52 am to provider Zorianna [facility] , who verbally acknowledged these results. Electronically Signed   By: Iven Finn M.D.   On: 03/11/2021 05:52   Result Date: 03/11/2021 CLINICAL DATA:  Abnormal lung, opacity. dyspnea EXAM: CT CHEST WITH CONTRAST TECHNIQUE: Multidetector CT imaging of the chest was performed during intravenous contrast administration. CONTRAST:  6mL OMNIPAQUE IOHEXOL 300 MG/ML  SOLN COMPARISON:  CT angio chest 10/23/2014, CT abdomen pelvis 11/13/2014, CT angio chest 12/14/2019 FINDINGS: Cardiovascular: Normal heart size. No significant pericardial effusion. The thoracic aorta is normal in caliber. No atherosclerotic plaque of the thoracic aorta. No coronary artery calcifications. Mediastinum/Nodes: No enlarged hilar, or axillary lymph nodes. Thyroid gland and trachea demonstrate no significant findings. Esophagus is diffusely markedly thickened with mucosal hyperemia and a fluid-filled lumen. Associated paraesophageal lymph nodes are noted to be enlarged measuring up to 1.1 cm (3:19). Lungs/Pleura: Thin trace subpleural right upper lobe opacity along the major fissure. Pulmonary micronodule within the left lower lobe. No pulmonary mass. No pleural effusion. No pneumothorax. Diffuse bronchial wall thickening. Possible mucous plugging within the right upper lobe. Upper Abdomen: Chronic likely portal venous shunt within the right hepatic lobe measuring 1.1 x 3 cm (3:19). Adjacent subcentimeter hypodensities too small to characterize. No acute abnormality. Musculoskeletal: No abdominal wall hernia or abnormality. No suspicious lytic or blastic osseous lesions. No acute displaced fracture. IMPRESSION: 1. Marked diffuse esophagitis and a  fluid-filled esophageal lumen in the setting of a small hiatal hernia. Associated paraesophageal lymphadenopathy underlying malignancy cannot be excluded. 2. Diffuse bronchial wall thickening suggestive of reactive airway disease. 3. Thin trace subpleural right upper lobe opacity along the major fissure. Finding could represent atelectasis in the setting of mucous plugging versus inflammation (such as aspiration pneumonia). Electronically Signed: By: Iven Finn M.D. On: 03/11/2021 05:49   DG Chest Port 1 View  Result Date: 03/11/2021 CLINICAL DATA:  Dyspnea EXAM: PORTABLE CHEST 1 VIEW COMPARISON:  12/13/2019 FINDINGS: The lungs are symmetrically well expanded. There is partial collapse of the right upper  lobe, new since prior examination. This may relate to atypical infection with concomitant airway inflammation and obstruction, an obstructing central foreign body, or a central obstructing mass. No pneumothorax or pleural effusion. Cardiac size within normal limits. The pulmonary vascularity is normal. IMPRESSION: Partial right upper lobe collapse. Correlation for a possible central obstructing lesion is recommended. Dedicated CT imaging with contrast is recommended for further evaluation. Electronically Signed   By: Fidela Salisbury MD   On: 03/11/2021 03:50    Procedures Procedures   Medications Ordered in ED Medications  ipratropium-albuterol (DUONEB) 0.5-2.5 (3) MG/3ML nebulizer solution 3 mL (has no administration in time range)  predniSONE (DELTASONE) tablet 60 mg (has no administration in time range)    ED Course  I have reviewed the triage vital signs and the nursing notes.  Pertinent labs & imaging results that were available during my care of the patient were reviewed by me and considered in my medical decision making (see chart for details).   MDM Rules/Calculators/A&P                         Acute dyspnea with bronchospasm.  Old records are reviewed, and he has a prior ED visit  for bronchitis and a hospitalization for respiratory distress.  Will check chest x-ray to rule out pneumonia.  He will be given albuterol and ipratropium and reassess.  Initial dose of prednisone is given.  ECG shows sinus tachycardia, unchanged from prior.  He feels much better after nebulizer treatment.  On reexam, lungs are almost completely clear.  Still faint wheezes in the right upper lobe.  Labs show significant microcytic anemia with hemoglobin 8.2, which is a drop from 9.0 on 07/21/2020.  Borderline renal insufficiency is present, unchanged from baseline.  Chest x-ray shows collapse of right upper lobe.  Radiologist recommends CT of chest with contrast, and this is ordered.  CT shows an area of atelectasis, not actual collapse of the right upper lobe.  However, evidence of significant esophagitis is noted.  Patient was ambulated in the emergency department and had no oxygen desaturation.  He is felt to be safe for discharge.  He is discharged with prescriptions for prednisone and albuterol inhaler.  He is to contact his primary care provider to monitor his hemoglobin as he may need transfusion in the near future.  He also needs to follow-up with his gastroenterologist for optimization of his treatment for GERD.  Return precautions discussed.  Final Clinical Impression(s) / ED Diagnoses Final diagnoses:  Severe persistent asthma with exacerbation  Microcytic anemia  Gastroesophageal reflux disease with esophagitis without hemorrhage    Rx / DC Orders ED Discharge Orders         Ordered    predniSONE (DELTASONE) 50 MG tablet  Daily        03/11/21 0701    albuterol (VENTOLIN HFA) 108 (90 Base) MCG/ACT inhaler  Every 4 hours PRN        03/11/21 7116           Delora Fuel, MD 57/90/38 [phone]

## 2021-03-11 NOTE — ED Triage Notes (Signed)
Patient arrived with EMS from home reports SOB this morning with brief altered LOC , chest congestion , audible crackles/wheezing at arrival , no fever or chills .

## 2021-03-11 NOTE — ED Notes (Signed)
Pt ambulated independently in room. O2 remained above 93% while ambulating on room air.

## 2021-03-11 NOTE — ED Notes (Signed)
Patient transported to CT scan . 

## 2021-03-11 NOTE — Discharge Instructions (Signed)
Your hemoglobin has continued to drop.  Today, it is 8.2.  Please follow-up with your primary care provider.  You may need another blood transfusion soon.  Your CT scan showed a lot of fluid building up in your esophagus.  Please follow-up with your gastroenterologist.  He may need another endoscopy soon.  Return if your symptoms are getting worse.

## 2021-03-22 ENCOUNTER — Telehealth: Payer: Self-pay | Admitting: Gastroenterology

## 2021-03-22 NOTE — Telephone Encounter (Signed)
Be sure he is taking pantoprazole 40 mg po bid before meals and famotidine 20 mg hs - both meds every day as scheduled. He has a history of noncompliance with mediations. Medication list indicates Prednisone 50 mg qd so could be candida esophagitis  Nystatin oral suspension 10 cc po swish and swallow qid for 10 days Call if symptoms not improving

## 2021-03-22 NOTE — Telephone Encounter (Signed)
Patient called and states for 1-2 days anything he swallows, including liquid is painful. He is not throwing anything back up. But with the hx.of food impaction he wants advise.

## 2021-03-22 NOTE — Telephone Encounter (Signed)
Pt calling to inform he is experiencing difficuty swallowing. Pt wants to know how to treat sxs..   Plz advise

## 2021-03-23 ENCOUNTER — Other Ambulatory Visit: Payer: Self-pay

## 2021-03-23 MED ORDER — NYSTATIN 100000 UNIT/ML MT SUSP: 10.0000 mL | Freq: Four times a day (QID) | OROMUCOSAL | 0 refills | Status: AC

## 2021-03-23 NOTE — Telephone Encounter (Signed)
Patient notified Rx sent

## 2021-04-13 ENCOUNTER — Ambulatory Visit: Payer: Self-pay | Admitting: Nurse Practitioner

## 2021-05-25 ENCOUNTER — Emergency Department (HOSPITAL_COMMUNITY): Payer: Self-pay

## 2021-05-25 ENCOUNTER — Other Ambulatory Visit: Payer: Self-pay

## 2021-05-25 ENCOUNTER — Ambulatory Visit (HOSPITAL_COMMUNITY)
Admission: EM | Admit: 2021-05-25 | Discharge: 2021-05-25 | Disposition: A | Discharge status: Home / Self Care | Payer: Self-pay | Attending: Emergency Medicine | Admitting: Emergency Medicine

## 2021-05-25 ENCOUNTER — Emergency Department (HOSPITAL_COMMUNITY): Payer: Self-pay | Admitting: Certified Registered Nurse Anesthetist

## 2021-05-25 ENCOUNTER — Encounter (HOSPITAL_COMMUNITY): Payer: Self-pay

## 2021-05-25 ENCOUNTER — Encounter (HOSPITAL_COMMUNITY)
Admission: EM | Disposition: A | Discharge status: Home / Self Care | Payer: Self-pay | Source: Home / Self Care | Attending: Emergency Medicine

## 2021-05-25 ENCOUNTER — Telehealth: Payer: Self-pay

## 2021-05-25 DIAGNOSIS — R1310 Dysphagia, unspecified: Secondary | ICD-10-CM

## 2021-05-25 DIAGNOSIS — R1319 Other dysphagia: Secondary | ICD-10-CM

## 2021-05-25 DIAGNOSIS — Z7952 Long term (current) use of systemic steroids: Secondary | ICD-10-CM | POA: Insufficient documentation

## 2021-05-25 DIAGNOSIS — Z20822 Contact with and (suspected) exposure to covid-19: Secondary | ICD-10-CM | POA: Insufficient documentation

## 2021-05-25 DIAGNOSIS — K222 Esophageal obstruction: Secondary | ICD-10-CM

## 2021-05-25 DIAGNOSIS — T18128A Food in esophagus causing other injury, initial encounter: Secondary | ICD-10-CM | POA: Insufficient documentation

## 2021-05-25 DIAGNOSIS — K219 Gastro-esophageal reflux disease without esophagitis: Secondary | ICD-10-CM | POA: Insufficient documentation

## 2021-05-25 DIAGNOSIS — Z79899 Other long term (current) drug therapy: Secondary | ICD-10-CM | POA: Insufficient documentation

## 2021-05-25 DIAGNOSIS — Z8719 Personal history of other diseases of the digestive system: Secondary | ICD-10-CM | POA: Insufficient documentation

## 2021-05-25 DIAGNOSIS — Z79891 Long term (current) use of opiate analgesic: Secondary | ICD-10-CM | POA: Insufficient documentation

## 2021-05-25 DIAGNOSIS — Z885 Allergy status to narcotic agent status: Secondary | ICD-10-CM | POA: Insufficient documentation

## 2021-05-25 DIAGNOSIS — X58XXXA Exposure to other specified factors, initial encounter: Secondary | ICD-10-CM | POA: Insufficient documentation

## 2021-05-25 HISTORY — PX: FOREIGN BODY REMOVAL: SHX962

## 2021-05-25 HISTORY — PX: ESOPHAGOGASTRODUODENOSCOPY: SHX5428

## 2021-05-25 LAB — COMPREHENSIVE METABOLIC PANEL
ALT: 14 U/L (ref 0–44)
AST: 18 U/L (ref 15–41)
Albumin: 4.1 g/dL (ref 3.5–5.0)
Alkaline Phosphatase: 87 U/L (ref 38–126)
Anion gap: 4 — ABNORMAL LOW (ref 5–15)
BUN: 14 mg/dL (ref 6–20)
CO2: 34 mmol/L — ABNORMAL HIGH (ref 22–32)
Calcium: 9.3 mg/dL (ref 8.9–10.3)
Chloride: 102 mmol/L (ref 98–111)
Creatinine, Ser: 1.12 mg/dL (ref 0.61–1.24)
GFR, Estimated: >60 mL/min (ref >60)
Glucose, Bld: 110 mg/dL — ABNORMAL HIGH (ref 70–99)
Potassium: 4.3 mmol/L (ref 3.5–5.1)
Sodium: 140 mmol/L (ref 135–145)
Total Bilirubin: 0.2 mg/dL — ABNORMAL LOW (ref 0.3–1.2)
Total Protein: 8.1 g/dL (ref 6.5–8.1)

## 2021-05-25 LAB — CBC WITH DIFFERENTIAL/PLATELET
Abs Immature Granulocytes: 0.01 10*3/uL (ref 0.00–0.07)
Basophils Absolute: 0 10*3/uL (ref 0.0–0.1)
Basophils Relative: 1 %
Eosinophils Absolute: 1.4 10*3/uL — ABNORMAL HIGH (ref 0.0–0.5)
Eosinophils Relative: 23 %
HCT: 41.8 % (ref 39.0–52.0)
Hemoglobin: 12 g/dL — ABNORMAL LOW (ref 13.0–17.0)
Immature Granulocytes: 0 %
Lymphocytes Relative: 26 %
Lymphs Abs: 1.6 10*3/uL (ref 0.7–4.0)
MCH: 21.7 pg — ABNORMAL LOW (ref 26.0–34.0)
MCHC: 28.7 g/dL — ABNORMAL LOW (ref 30.0–36.0)
MCV: 75.7 fL — ABNORMAL LOW (ref 80.0–100.0)
Monocytes Absolute: 0.5 10*3/uL (ref 0.1–1.0)
Monocytes Relative: 8 %
Neutro Abs: 2.6 10*3/uL (ref 1.7–7.7)
Neutrophils Relative %: 42 %
Platelets: 399 10*3/uL (ref 150–400)
RBC: 5.52 MIL/uL (ref 4.22–5.81)
RDW: 20.9 % — ABNORMAL HIGH (ref 11.5–15.5)
WBC: 6.2 10*3/uL (ref 4.0–10.5)
nRBC: 0 % (ref 0.0–0.2)

## 2021-05-25 LAB — RESP PANEL BY RT-PCR (FLU A&B, COVID) ARPGX2
Influenza A by PCR: NEGATIVE
Influenza B by PCR: NEGATIVE
SARS Coronavirus 2 by RT PCR: NEGATIVE

## 2021-05-25 LAB — LIPASE, BLOOD: Lipase: 24 U/L (ref 11–51)

## 2021-05-25 SURGERY — EGD (ESOPHAGOGASTRODUODENOSCOPY)
Anesthesia: General | Laterality: Left

## 2021-05-25 MED ORDER — MIDAZOLAM HCL 5 MG/5ML IJ SOLN
INTRAMUSCULAR | Status: DC | PRN
  Administered 2021-05-25: 2 mg via INTRAVENOUS

## 2021-05-25 MED ORDER — ALBUTEROL SULFATE (2.5 MG/3ML) 0.083% IN NEBU
2.5000 mg | INHALATION_SOLUTION | Freq: Once | RESPIRATORY_TRACT | Status: AC
  Administered 2021-05-25: 2.5 mg via RESPIRATORY_TRACT

## 2021-05-25 MED ORDER — ALBUTEROL SULFATE (2.5 MG/3ML) 0.083% IN NEBU
INHALATION_SOLUTION | RESPIRATORY_TRACT | Status: AC
  Filled 2021-05-25: qty 3

## 2021-05-25 MED ORDER — GLUCAGON HCL RDNA (DIAGNOSTIC) 1 MG IJ SOLR
1.0000 mg | Freq: Once | INTRAMUSCULAR | Status: AC
  Administered 2021-05-25: 1 mg via INTRAVENOUS
  Filled 2021-05-25: qty 1

## 2021-05-25 MED ORDER — MIDAZOLAM HCL 2 MG/2ML IJ SOLN
INTRAMUSCULAR | Status: AC
  Filled 2021-05-25: qty 2

## 2021-05-25 MED ORDER — LACTATED RINGERS IV SOLN
INTRAVENOUS | Status: AC | PRN
  Administered 2021-05-25: 1000 mL via INTRAVENOUS

## 2021-05-25 MED ORDER — FENTANYL CITRATE (PF) 100 MCG/2ML IJ SOLN
INTRAMUSCULAR | Status: DC | PRN
  Administered 2021-05-25: 50 ug via INTRAVENOUS

## 2021-05-25 MED ORDER — SUCCINYLCHOLINE CHLORIDE 200 MG/10ML IV SOSY
PREFILLED_SYRINGE | INTRAVENOUS | Status: DC | PRN
  Administered 2021-05-25: 180 mg via INTRAVENOUS

## 2021-05-25 MED ORDER — PROPOFOL 10 MG/ML IV BOLUS
INTRAVENOUS | Status: DC | PRN
  Administered 2021-05-25: 190 mg via INTRAVENOUS

## 2021-05-25 MED ORDER — FENTANYL CITRATE (PF) 100 MCG/2ML IJ SOLN
INTRAMUSCULAR | Status: AC
  Filled 2021-05-25: qty 2

## 2021-05-25 MED ORDER — ONDANSETRON HCL 4 MG/2ML IJ SOLN
INTRAMUSCULAR | Status: DC | PRN
  Administered 2021-05-25: 4 mg via INTRAVENOUS

## 2021-05-25 MED ORDER — LIDOCAINE HCL (CARDIAC) PF 100 MG/5ML IV SOSY
PREFILLED_SYRINGE | INTRAVENOUS | Status: DC | PRN
  Administered 2021-05-25: 100 mg via INTRAVENOUS

## 2021-05-25 MED ORDER — SUCRALFATE 1 GM/10ML PO SUSP: 1.0000 g | Freq: Four times a day (QID) | ORAL | 1 refills | Status: DC

## 2021-05-25 MED ORDER — DEXAMETHASONE SODIUM PHOSPHATE 10 MG/ML IJ SOLN
INTRAMUSCULAR | Status: DC | PRN
  Administered 2021-05-25: 10 mg via INTRAVENOUS

## 2021-05-25 NOTE — Telephone Encounter (Signed)
-----   Message from Ladene Artist, MD sent at 05/25/2021 11:54 AM EDT ----- Regarding: RE: Food impaction follow-up Vito,   His esophageal disease has been very difficult to manage. I am just now aware of the lymph node enlargement found in May. We will set up and EGD with dil.   Sheri,  Please schedule EGD/dilation in [place] soon.   Thanks,   MS ----- Message ----- From: Lavena Bullion, DO Sent: 05/25/2021  10:31 AM EDT To: Ladene Artist, MD Subject: Food impaction follow-up                       Norberto Sorenson,  Patient came in late last night with food impaction. EGD this AM with retrieval. He has a high grade stenosis at 30 cm that could not be traversed and was not dilated due to mucosal erythema/edema 2/2 impaction site. Impaction totally cleared out though. Will need close follow-up and repeat EGD with serial dilation. I told him to not advance beyond mechanical soft diet until he is seen in the office and repeat EGD with dil. Already on high dose PPI and H2RA. Adding Carafate now.   I looked through your old notes and endo reports. He also has this recent CT that shows esophagitis, lymph node enlargement (1.1 cm) and fluid filled esophagus. He is definitely a tricky case.   Thanks.   [employer]

## 2021-05-25 NOTE — Interval H&P Note (Signed)
History and Physical Interval Note:  05/25/2021 10:02 AM  Eric Hood  has presented today for surgery, with the diagnosis of Dysphagia, esophageal food impaction.  The various methods of treatment have been discussed with the patient and family. After consideration of risks, benefits and other options for treatment, the patient has consented to  Procedure(s): ESOPHAGOGASTRODUODENOSCOPY (EGD) (Left) as a surgical intervention.  The patient's history has been reviewed, patient examined, no change in status, stable for surgery.  I have reviewed the patient's chart and labs.  Questions were answered to the patient's satisfaction.     Dominic Pea [name]

## 2021-05-25 NOTE — Op Note (Signed)
[facility] Patient Name: Eric Hood Procedure Date: 05/25/2021 MRN: [id number] Attending MD: [name] , MD Date of Birth: Feb 16, 1981 CSN: [id number] Age: 40 Admit Type: Outpatient Procedure:                Upper GI endoscopy Indications:              Dysphagia, Acute food impaction in the esophagus                           40 yo male with a history of GERD with severe                            erosive esophagitis and Barrett's Esophagus, along                            with esophageal stricture requiring prior                            esophageal dilation, presents with acute esophageal                            food impaction. Providers:                [name], MD, Particia Nearing, RN, Elspeth Cho Tech., Technician Referring MD:              Medicines:                General Anesthesia Complications:            No immediate complications. Estimated Blood Loss:     Estimated blood loss was minimal. Procedure:                Pre-Anesthesia Assessment:                           - Prior to the procedure, a History and Physical                            was performed, and patient medications and                            allergies were reviewed. The patient's tolerance of                            previous anesthesia was also reviewed. The risks                            and benefits of the procedure and the sedation                            options and risks were discussed with the patient.                            All questions were  answered, and informed consent                            was obtained. Prior Anticoagulants: The patient has                            taken no previous anticoagulant or antiplatelet                            agents. ASA Grade Assessment: I - A normal, healthy                            patient. After reviewing the risks and benefits,                            the patient was  deemed in satisfactory condition to                            undergo the procedure.                           After obtaining informed consent, the endoscope was                            passed under direct vision. Throughout the                            procedure, the patient's blood pressure, pulse, and                            oxygen saturations were monitored continuously. The                            [id number] Olympus endoscope was introduced                            through the mouth, and advanced to the lower third                            of esophagus. The upper GI endoscopy was                            accomplished without difficulty. The patient                            tolerated the procedure well. Scope In: Scope Out: Findings:      Food bolus were found in the middle third of the esophagus, located 25       cm from the incisors. Removal was accomplished with a Roth net.      One severe stenosis was found 30 cm from the incisors. This stenosis       measured 6 mm (inner diameter). The stenosis was not traversed. There       was mucosal erythema and irritation proximal to the stricture secondary  to the recent food impaction. Impression:               - Food bolus were found in the esophagus. Removal                            was successful using a Roth net.                           - High grade esophageal stenosis which could not be                            traversed. Moderate Sedation:      Not Applicable - Patient had care per Anesthesia. Recommendation:           - Patient has a contact number available for                            emergencies. The signs and symptoms of potential                            delayed complications were discussed with the                            patient. Return to normal activities tomorrow.                            Written discharge instructions were provided to the                             patient.                           - Full liquid diet for 2 days then advance as                            tolerated to soft, blender-consistency diet. Do NOT                            advance beyond this mechanical soft diet until                            follow-up and repeat upper endoscopy.                           - Continue present medications.                           - Use sucralfate suspension 1 gram PO QID for 4                            weeks.                           - Follow-up with Dr. Fuller Plan in the GI clinic at  appointment to be scheduled.                           - Repeat upper endoscopy in 4-6 weeks to evaluate                            for mucosal healing and to start serial esophageal                            dilations. Procedure Code(s):        --- Professional ---                           [phone], Esophagoscopy, flexible, transoral; with                            removal of foreign body(s) Diagnosis Code(s):        --- Professional ---                           [phone], Other foreign object in esophagus causing                            other injury, initial encounter                           K22.2, Esophageal obstruction                           R13.10, Dysphagia, unspecified                           T18.108A, Unspecified foreign body in esophagus                            causing other injury, initial encounter CPT copyright 2019 [employer]. All rights reserved. The codes documented in this report are preliminary and upon coder review may  be revised to meet current compliance requirements. [name], MD 05/25/2021 10:26:44 AM Number of Addenda: 0

## 2021-05-25 NOTE — H&P (View-Only) (Signed)
Attending physician's note   I have taken an interval history, reviewed the chart and examined the patient. I agree with the Advanced Practitioner's note, impression, and recommendations as outlined.   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  Last EGD was 06/2020 with dilation of esophageal stricture with 15 mm Savary dilator.  Reviewed prior EGDs and he has persistent erosive esophagitis and esophageal ulceration despite being on high-dose PPI and H2 RA.  He has been taking his Protonix 40 mg twice daily and Pepcid at bedtime as prescribed.  Does have 1 prior episode of esophageal food impaction requiring emergent EGD, approximately 5-6 years ago per patient.  - Hood for emergent EGD this morning for esophageal food impaction removal - Will require short interval follow-up in the GI clinic with continued esophageal dilations  Eric Heck, DO, FACG [phone] office             Consultation  Referring Hood:  ER MD [facility] Primary Care Physician:  [name], Provider, MD Primary Gastroenterologist:  Eric Hood  Reason for Consultation:  Food impaction  HPI: Eric Hood is a 40 y.o. male, known to Eric Hood with history of chronic GERD, ulcerative esophagitis and recurrent esophageal stricture.  He also has long segment Barrett's esophagus.  Patient has had several previous esophageal dilations.  He presented to the emergency room early this morning with acute dysphagia.  He says he ate a pork chop and rice for dinner last night, feels that he got the meat lodged in his esophagus and has been unable to swallow since.  He is unable to swallow his saliva.  He had glucagon in the ER without change in symptoms.  He is complaining of mild discomfort.  He says he had been  doing well over the past multiple months but did have a mild episode of transient dysphagia last week.  He has not been having any heartburn or indigestion. He has been diligent about taking his Protonix 40 mg twice daily AC breakfast and AC dinner and has been taking Pepcid at bedtime. Patient last had EGD in September 2021 with finding of a long segment Barrett's esophagus, 3 cratered esophageal ulcers the largest 10 mm and grade C esophagitis.  There was a distal esophageal stricture measuring 1.2 cm, he underwent Savary dilation to 15 mm.  Patient also has history of iron deficiency anemia, has followed with oncology.  This may be secondary to chronic losses from his esophagus. He says he had an ER visit in May 2022 with shortness of breath and syncope which was attributed to asthma.  He has an albuterol inhaler that he uses as needed at home now. Prior history of substance abuse, on methadone.  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 and a diffusely markedly thickened esophagus with mucosal hyperemia and a fluid-filled lumen, associated paraesophageal lymph nodes noted to be enlarged measuring up to 1.1 cm, small hiatal hernia   Past Medical History:  Diagnosis Date   Anemia 10/23/2014   Anxiety    Back pain    Blood transfusion without reported diagnosis april 2016/feb 2020   Breathing difficulty 12/13/2019   went  to ER and felt unable to breathe   Esophageal stricture 03/2014   Esophageal ulcer    GERD (gastroesophageal reflux disease)    Hiatal hernia    Reflux    Substance abuse ([place])    on methadone   Ulcerative esophagitis 03/2014   severe    Past Surgical History:  Procedure Laterality Date   BALLOON DILATION N/A 05/20/2015   Procedure: BALLOON DILATION;  Surgeon: Eric Banister, MD;  Location: [place];  Service: Endoscopy;  Laterality: N/A;   COLONOSCOPY  2018   ESOPHAGEAL DILATION     On 3 occasions: 03/2014, 08/2014, 09/2014   ESOPHAGOGASTRODUODENOSCOPY N/A 05/20/2015   Procedure: ESOPHAGOGASTRODUODENOSCOPY (EGD);  Surgeon: Eric Banister, MD;  Location: [place];  Service: Endoscopy;  Laterality: N/A;   ESOPHAGOGASTRODUODENOSCOPY (EGD) WITH PROPOFOL N/A 01/18/2015   Procedure: ESOPHAGOGASTRODUODENOSCOPY (EGD) WITH PROPOFOL;  Surgeon: Eric Dragon, MD;  Location: [facility];  Service: Endoscopy;  Laterality: N/A;   ESOPHAGOGASTRODUODENOSCOPY (EGD) WITH PROPOFOL N/A 09/04/2017   Procedure: ESOPHAGOGASTRODUODENOSCOPY (EGD) WITH PROPOFOL;  Surgeon: Eric Artist, MD;  Location: [facility];  Service: Endoscopy;  Laterality: N/A;  need fluro   ESOPHAGOGASTRODUODENOSCOPY (EGD) WITH PROPOFOL N/A 10/01/2017   Procedure: ESOPHAGOGASTRODUODENOSCOPY (EGD) WITH PROPOFOL;  Surgeon: Eric Artist, MD;  Location[facility];  Service: Endoscopy;  Laterality: N/A;   HAND SURGERY Right    SAVORY DILATION N/A 09/04/2017   Procedure: SAVORY DILATION;  Surgeon: Eric Artist, MD;  Location[facility];  Service: Endoscopy;  Laterality: N/A;   SAVORY DILATION N/A 10/01/2017   Procedure: SAVORY DILATION;  Surgeon: Eric Artist, MD;  Location[facility];  Service: Endoscopy;  Laterality: N/A;   UPPER GASTROINTESTINAL ENDOSCOPY  11/27/2019    Prior to Admission medications   Medication Sig Start Date End Date Taking? Authorizing Hood  albuterol (VENTOLIN HFA) 108 (90 Base) MCG/ACT inhaler Inhale 2 puffs into the lungs every 4 (four) hours as needed for wheezing or shortness of breath. 123456  Yes Eric Fuel, MD  famotidine (PEPCID) 20 MG tablet TAKE 1 TABLET(20 MG) BY MOUTH AT BEDTIME Patient taking differently: Take 40 mg by mouth at bedtime as needed for heartburn or indigestion. 04/29/20  Yes Eric Artist, MD  pantoprazole (PROTONIX) 40  MG tablet TAKE 1 TABLET(40 MG) BY MOUTH TWICE DAILY BEFORE A MEAL Patient taking differently: Take 40 mg by mouth 2 (two) times daily before a meal. 10/01/20  Yes Eric Artist, MD  Phenyleph-CPM-DM-APAP (TYLENOL COLD HEAD CONGESTION PO) Take 2 tablets by mouth 2 (two) times daily as needed (congestion).   Yes Hood, Historical, MD  promethazine (PHENERGAN) 12.5 MG tablet TAKE 1 TABLET(12.5 MG) BY MOUTH THREE TIMES DAILY AS NEEDED FOR NAUSEA OR VOMITING Patient taking differently: Take 12.5 mg by mouth 3 (three) times daily as needed for nausea or vomiting. 07/12/20  Yes Eric Artist, MD  methadone (DOLOPHINE) 10 MG/ML solution Take 80 mg by mouth daily. Patient not taking: Reported on 05/25/2021    Hood, Historical, MD  predniSONE (DELTASONE) 50 MG tablet Take 1 tablet (50 mg total) by mouth daily. Patient not taking: Reported on [id number]   Eric Fuel, MD    Current Facility-Administered Medications  Medication Dose Route Frequency Hood Last Rate Last Admin   lactated ringers infusion    Continuous PRN Kristine Chahal V, DO 10 mL/hr at 05/25/21 0840 1,000 mL at 05/25/21 0840    Allergies as of 05/25/2021 - Review Complete 05/25/2021  Allergen  Reaction Noted   Atarax [hydroxyzine] Anaphylaxis 07/21/2020   Hydrocodone Nausea And Vomiting 09/16/2015    Family History  Problem Relation Age of Onset   Hypertension Mother    Hypertension Father    Colon cancer Neg Hx    Esophageal cancer Neg Hx    Rectal cancer Neg Hx    Stomach cancer Neg Hx    Colon polyps Neg Hx    Liver cancer Neg Hx    Pancreatic cancer Neg Hx     Social History   Socioeconomic History   Marital status: Single    Spouse name: Not on file   Number of children: Not on file   Years of education: Not on file   Highest education level: Not on file  Occupational History   Not on file  Tobacco Use   Smoking status: Never   Smokeless tobacco: Never  Vaping Use   Vaping Use: Never  used  Substance and Sexual Activity   Alcohol use: Not Currently   Drug use: Not Currently    Types: Heroin    Comment: currently taking Methadone   Sexual activity: Not on file  Other Topics Concern   Not on file  Social History Narrative   Not on file   Social Determinants of Health   Financial Resource Strain: Not on file  Food Insecurity: Not on file  Transportation Needs: Not on file  Physical Activity: Not on file  Stress: Not on file  Social Connections: Not on file  Intimate Partner Violence: Not on file    Review of Systems: Pertinent positive and negative review of systems were noted in the above HPI section.  All other review of systems was otherwise negative.   Physical Exam: Vital signs in last 24 hours: Temp:  [97.8 F (36.6 C)-98.3 F (36.8 C)] 98.3 F (36.8 C) (08/10 0837) Pulse Rate:  [75-97] 75 (08/10 0837) Resp:  [14-16] 14 (08/10 0837) BP: (103-146)/(78-98) 146/98 (08/10 0837) SpO2:  [90 %-99 %] 99 % (08/10 0837) Weight:  [72.6 kg] 72.6 kg (08/10 0628)   General:   Alert,  Well-developed, well-nourished, white male pleasant and cooperative in NAD Head:  Normocephalic and atraumatic. Eyes:  Sclera clear, no icterus.   Conjunctiva pink. Ears:  Normal auditory acuity. Nose:  No deformity, discharge,  or lesions. Mouth:  No deformity or lesions.   Neck:  Supple; no masses or thyromegaly. Lungs: Scattered rhonchi and wheezes bilaterally  heart:  Regular rate and rhythm; no murmurs, clicks, rubs,  or gallops. Abdomen:  Soft,nontender, BS active,nonpalp mass or hsm.   Rectal:  Deferred  Msk:  Symmetrical without gross deformities. . Pulses:  Normal pulses noted. Extremities:  Without clubbing or edema. Neurologic:  Alert and  oriented x4;  grossly normal neurologically. Skin:  Intact without significant lesions or rashes.. Psych:  Alert and cooperative. Normal mood and affect.  Intake/Output from previous day: No intake/output data  recorded. Intake/Output this shift: No intake/output data recorded.  Lab Results: Recent Labs    05/25/21 0656  WBC 6.2  HGB 12.0*  HCT 41.8  PLT 399   BMET Recent Labs    05/25/21 0656  NA 140  K 4.3  CL 102  CO2 34*  GLUCOSE 110*  BUN 14  CREATININE 1.12  CALCIUM 9.3   LFT Recent Labs    05/25/21 0656  PROT 8.1  ALBUMIN 4.1  AST 18  ALT 14  ALKPHOS 87  BILITOT 0.2*   PT/INR No results  for input(s): LABPROT, INR in the last 72 hours. Hepatitis Panel No results for input(s): HEPBSAG, HCVAB, HEPAIGM, HEPBIGM in the last 72 hours.    IMPRESSION:  #52 40 year old white male with history of long segment Barrett's esophagus, chronic GERD, ulcerative esophagitis and distal esophageal stricture which has required several dilations in the past. Patient presents now with acute food impaction which occurred with dinner last night (pork chops and rice)  #2 abnormal chest CT May 2022 showing diffuse esophagitis, fluid-filled esophageal lumen and associated paraesophageal lymphadenopathy.  This is despite twice daily PPI and at bedtime H2 blocker Patient due for surveillance EGD with biopsies December 2022 with history of long segment Barrett's will need to rule out malignancy  #3 asthma-some wheezing this morning, was recently prescribed albuterol which he has used as needed  #4 history of iron deficiency anemia    Hood: Patient will undergo EGD with Dr. Bryan Lemma this morning with removal of food impaction .  He will need to be scheduled for EGD with esophageal dilation, very soon with Eric Hood, and biopsy at that time.  Soft diet Strict adherence to twice daily Protonix AC breakfast and AC dinner and Pepcid 40 mg at bedtime. This regimen does not appear to be working well.  Once malignancy has been excluded, may need to be considered for an antireflux procedure.   Amy Esterwood PA-C 05/25/2021, 8:43 AM

## 2021-05-25 NOTE — Telephone Encounter (Signed)
Scheduled EGD w/dilation on 06/07/21 Instructions sent by My Chart and reviewed on the phone with patient

## 2021-05-25 NOTE — ED Triage Notes (Signed)
Pt ate pork chop last night and a piece became lodged in his throat. Pt states that he coughed up some of it. Pt reports that he has Barrett's Esophagus.

## 2021-05-25 NOTE — Anesthesia Preprocedure Evaluation (Addendum)
Anesthesia Evaluation  Patient identified by MRN, date of birth, ID band Patient awake    Reviewed: Allergy & Precautions, NPO status , Patient's Chart, lab work & pertinent test results  Airway Mallampati: I  TM Distance: >3 FB Neck ROM: Full    Dental no notable dental hx. (+) Teeth Intact   Pulmonary    Pulmonary exam normal breath sounds clear to auscultation       Cardiovascular Exercise Tolerance: Good Normal cardiovascular exam Rhythm:Regular Rate:Normal     Neuro/Psych PSYCHIATRIC DISORDERS Anxiety    GI/Hepatic PUD, GERD  Medicated and Controlled,(+)     substance abuse (off methadone x 2-3 weeks)  ,   Endo/Other  negative endocrine ROS  Renal/GU negative Renal ROS  negative genitourinary   Musculoskeletal negative musculoskeletal ROS (+) narcotic dependent  Abdominal Normal abdominal exam  (+)   Peds negative pediatric ROS (+)  Hematology  (+) anemia ,   Anesthesia Other Findings   Reproductive/Obstetrics negative OB ROS                            Anesthesia Physical  Anesthesia Plan  ASA: 2 and emergent  Anesthesia Plan: General   Post-op Pain Management:    Induction: Intravenous and Rapid sequence  PONV Risk Score and Plan: 2 and Ondansetron, Dexamethasone and Treatment may vary due to age or medical condition  Airway Management Planned: Oral ETT  Additional Equipment:   Intra-op Plan:   Post-operative Plan:   Informed Consent: I have reviewed the patients History and Physical, chart, labs and discussed the procedure including the risks, benefits and alternatives for the proposed anesthesia with the patient or authorized representative who has indicated his/her understanding and acceptance.       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

## 2021-05-25 NOTE — Anesthesia Postprocedure Evaluation (Signed)
Anesthesia Post Note  Patient: Eric Hood  Procedure(s) Performed: ESOPHAGOGASTRODUODENOSCOPY (EGD) (Left) FOREIGN BODY REMOVAL     Patient location during evaluation: Endoscopy Anesthesia Type: General Level of consciousness: awake and alert Pain management: pain level controlled Vital Signs Assessment: post-procedure vital signs reviewed and stable Respiratory status: spontaneous breathing, nonlabored ventilation and respiratory function stable Cardiovascular status: blood pressure returned to baseline and stable Postop Assessment: no apparent nausea or vomiting Anesthetic complications: no   No notable events documented.  Last Vitals:  Vitals:   05/25/21 1050 05/25/21 1100  BP: 120/81 118/81  Pulse: 79 76  Resp: 11 10  Temp:    SpO2: 100% 98%    Last Pain:  Vitals:   05/25/21 1030  TempSrc: Oral  PainSc: 0-No pain                 Merlinda Frederick

## 2021-05-25 NOTE — ED Provider Notes (Signed)
[facility] DEPT Provider Note   CSN: [id number] Arrival date & time: 05/25/21  [id number]     History No chief complaint on file.   Domnick Itkin is a 40 y.o. male.  The history is provided by the patient and medical records. No language interpreter was used.   40 year old male significant history of Barrett's esophagus and history of esophageal stricture who presents with complaints of foreign body in throat.  Patient report last night he was eating food and after swallow.  He felt that it was stuck in the back of his throat.  Since then he is unable to advance any fluid or solid when swallowing.  He has to spit in his saliva.  This felt similar to prior episodes of dysphagia that he usually require GI intervention.  His GI specialist is Dr. Fuller Plan.  Last similar episode was approximately a year ago.  No complaints of fever, chest pain, throat pain, or nausea.  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   Fever 11/19/2018   Acute on chronic anemia 11/19/2018   Dysphagia    Thoracic compression fracture ([place]) 10/14/2015   Trigger point of thoracic region 09/03/2015   Acute lung injury    Acute respiratory failure with hypoxia ([facility])    Respiratory distress    Respiratory failure ([place]) 05/15/2015   Acute respiratory failure with hypoxemia ([facility])    Aspiration pneumonia ([facility])    Acute encephalopathy    Narcotic  overdose ([place])    Loss of weight 02/10/2015   Scaphoid fracture of wrist 01/20/2015   Acute esophagitis    Esophageal stricture    Food impaction of esophagus    Acute right lower quadrant pain 11/13/2014   Anemia 10/23/2014   Heme positive stool 10/23/2014   GERD (gastroesophageal reflux disease) 10/23/2014   Right-sided chest/RUQ pain 10/23/2014   History of esophagitis and stricture 10/23/2014   Anxiety state, unspecified 06/15/2014   Scapular dysfunction 03/29/2014   Nonallopathic lesion of thoracic region 03/29/2014   Nonallopathic lesion of cervical region 03/29/2014   Nonallopathic lesion-rib cage 03/29/2014   Nonallopathic lesion of sacral region 03/29/2014   Nonallopathic lesion of lumbosacral region 03/29/2014   Dysplastic nevus of trunk 03/07/2013   Thoracic back pain 02/11/2013    Past Surgical History:  Procedure Laterality Date   BALLOON DILATION N/A 05/20/2015   Procedure: BALLOON DILATION;  Surgeon: Milus Banister, MD;  Location: [facility];  Service: Endoscopy;  Laterality: N/A;   COLONOSCOPY  2018   ESOPHAGEAL DILATION     On 3 occasions: 03/2014, 08/2014, 09/2014   ESOPHAGOGASTRODUODENOSCOPY N/A 05/20/2015   Procedure: ESOPHAGOGASTRODUODENOSCOPY (EGD);  Surgeon: Milus Banister, MD;  Location: [facility];  Service: Endoscopy;  Laterality: N/A;   ESOPHAGOGASTRODUODENOSCOPY (EGD) WITH PROPOFOL N/A 01/18/2015   Procedure: ESOPHAGOGASTRODUODENOSCOPY (EGD) WITH PROPOFOL;  Surgeon: Lafayette Dragon,  MD;  Location: [facility];  Service: Endoscopy;  Laterality: N/A;   ESOPHAGOGASTRODUODENOSCOPY (EGD) WITH PROPOFOL N/A 09/04/2017   Procedure: ESOPHAGOGASTRODUODENOSCOPY (EGD) WITH PROPOFOL;  Surgeon: Ladene Artist, MD;  Location: [facility];  Service: Endoscopy;  Laterality: N/A;  need fluro   ESOPHAGOGASTRODUODENOSCOPY (EGD) WITH PROPOFOL N/A 10/01/2017   Procedure: ESOPHAGOGASTRODUODENOSCOPY (EGD) WITH PROPOFOL;  Surgeon: Ladene Artist, MD;  Location: [facility];  Service: Endoscopy;  Laterality: N/A;   HAND SURGERY Right    SAVORY DILATION N/A 09/04/2017   Procedure: SAVORY DILATION;  Surgeon: Ladene Artist, MD;  Location: [facility];  Service: Endoscopy;  Laterality: N/A;   SAVORY DILATION N/A 10/01/2017   Procedure: SAVORY DILATION;  Surgeon: Ladene Artist, MD;  Location: [facility];  Service: Endoscopy;  Laterality: N/A;   UPPER GASTROINTESTINAL ENDOSCOPY  11/27/2019       Family History  Problem Relation Age of Onset   Hypertension Mother    Hypertension Father    Colon cancer Neg Hx    Esophageal cancer Neg Hx    Rectal cancer Neg Hx    Stomach cancer Neg Hx    Colon polyps Neg Hx    Liver cancer Neg Hx    Pancreatic cancer Neg Hx     Social History   Tobacco Use   Smoking status: Never   Smokeless tobacco: Never  Vaping Use   Vaping Use: Never used  Substance Use Topics   Alcohol use: Not Currently   Drug use: Not Currently    Types: Heroin    Comment: currently taking Methadone    Home Medications Prior to Admission medications   Medication Sig Start Date End Date Taking? Authorizing Provider  albuterol (VENTOLIN HFA) 108 (90 Base) MCG/ACT inhaler Inhale 2 puffs into the lungs every 4 (four) hours as needed for wheezing or shortness of breath. 123456   Delora Fuel, MD  famotidine (PEPCID) 20 MG tablet TAKE 1 TABLET(20 MG) BY MOUTH AT BEDTIME Patient not taking: No sig reported 04/29/20   Ladene Artist, MD  methadone (DOLOPHINE) 10 MG/ML solution Take 80 mg by mouth daily.    [provider]  pantoprazole (PROTONIX) 40 MG tablet TAKE 1 TABLET(40 MG) BY MOUTH TWICE DAILY BEFORE A MEAL Patient taking differently: Take 40 mg by mouth 2 (two) times daily before a meal. 10/01/20   Ladene Artist, MD  predniSONE (DELTASONE) 50 MG tablet Take 1 tablet (50 mg total) by mouth daily. 123456   Delora Fuel, MD  promethazine (PHENERGAN) 12.5 MG tablet TAKE 1 TABLET(12.5 MG) BY MOUTH THREE TIMES  DAILY AS NEEDED FOR NAUSEA OR VOMITING Patient taking differently: Take 12.5 mg by mouth 3 (three) times daily as needed for nausea or vomiting. 07/12/20   Ladene Artist, MD    Allergies    Atarax [hydroxyzine] and Hydrocodone  Review of Systems   Review of Systems  All other systems reviewed and are negative.  Physical Exam Updated Vital Signs BP 117/78 (BP Location: Left Arm)   Pulse 92   Temp 97.8 F (36.6 C) (Oral)   Resp 15   Ht 6' (1.829 m)   Wt 72.6 kg   SpO2 97%   BMI 21.70 kg/m   Physical Exam Vitals and nursing note reviewed.  Constitutional:      General: He is not in acute distress.    Appearance: He is well-developed.     Comments: Patient able to speak in complete sentences however is actively spitting  up his saliva.  In no respiratory discomfort  HENT:     Head: Atraumatic.     Nose: Nose normal.     Mouth/Throat:     Mouth: Mucous membranes are moist.  Eyes:     Conjunctiva/sclera: Conjunctivae normal.  Cardiovascular:     Rate and Rhythm: Normal rate and regular rhythm.     Pulses: Normal pulses.     Heart sounds: Normal heart sounds.  Pulmonary:     Effort: Pulmonary effort is normal.     Breath sounds: Normal breath sounds.  Abdominal:     Palpations: Abdomen is soft.     Tenderness: There is no abdominal tenderness.  Musculoskeletal:     Cervical back: Neck supple.  Skin:    Findings: No rash.  Neurological:     Mental Status: He is alert.    ED Results / Procedures / Treatments   Labs (all labs ordered are listed, but only abnormal results are displayed) Labs Reviewed  CBC WITH DIFFERENTIAL/PLATELET - Abnormal; Notable for the following components:      Result Value   Hemoglobin 12.0 (*)    MCV 75.7 (*)    MCH 21.7 (*)    MCHC 28.7 (*)    RDW 20.9 (*)    Eosinophils Absolute 1.4 (*)    All other components within normal limits  COMPREHENSIVE METABOLIC PANEL - Abnormal; Notable for the following components:   CO2 34 (*)     Glucose, Bld 110 (*)    Total Bilirubin 0.2 (*)    Anion gap 4 (*)    All other components within normal limits  RESP PANEL BY RT-PCR (FLU A&B, COVID) ARPGX2  LIPASE, BLOOD    EKG None  Radiology DG Neck Soft Tissue  Result Date: 05/25/2021 CLINICAL DATA:  40 year old male choked on porch shot last night with persistent foreign body sensation. EXAM: NECK SOFT TISSUES - 1+ VIEW COMPARISON:  Thoracic spine radiographs 1 MERS view 09/03/2015. FINDINGS: Prevertebral soft tissue contours stable and normal. Pharyngeal soft tissue contours including the epiglottis are within normal limits. Visualized tracheal air column is within normal limits. No radiopaque foreign body identified. Negative visible upper chest. No osseous abnormality identified. IMPRESSION: Negative. No retained radiopaque foreign body identified. Electronically Signed   By: Genevie Ann M.D.   On: 05/25/2021 06:56    Procedures Procedures   Medications Ordered in ED Medications  glucagon (human recombinant) (GLUCAGEN) injection 1 mg (1 mg Intravenous Given 05/25/21 0700)  lactated ringers infusion ( Intravenous Restarted 05/25/21 1025)  albuterol (PROVENTIL) (2.5 MG/3ML) 0.083% nebulizer solution 2.5 mg (2.5 mg Nebulization Given 05/25/21 [id number])    ED Course  I have reviewed the triage vital signs and the nursing notes.  Pertinent labs & imaging results that were available during my care of the patient were reviewed by me and considered in my medical decision making (see chart for details).    MDM Rules/Calculators/A&P                           BP 118/81   Pulse 76   Temp 97.7 F (36.5 C) (Oral)   Resp 10   Ht 6' (1.829 m)   Wt 72.6 kg   SpO2 98%   BMI 21.70 kg/m   Final Clinical Impression(s) / ED Diagnoses Final diagnoses:  Dysphagia, unspecified type    Rx / DC Orders ED Discharge Orders     None  Patient with history of esophageal stricture requiring dilatations and food retrieval in the past who  is here with dysphagia after eating pork chop last night.  No airway compromise at this time however plan to consult GI specialist for further management.  7:48 AM Appreciate consultation from GI specialist who request for a COVID test and will see patient for further management.   Domenic Moras, PA-C [id number]    Lianne Cure, DO [id number]

## 2021-05-25 NOTE — Transfer of Care (Signed)
Immediate Anesthesia Transfer of Care Note  Patient: Eric Hood  Procedure(s) Performed: ESOPHAGOGASTRODUODENOSCOPY (EGD) (Left) FOREIGN BODY REMOVAL  Patient Location: Endoscopy Unit  Anesthesia Type:General  Level of Consciousness: awake and patient cooperative  Airway & Oxygen Therapy: Patient Spontanous Breathing  Post-op Assessment: Report given to RN and Post -op Vital signs reviewed and stable  Post vital signs: Reviewed and stable  Last Vitals:  Vitals Value Taken Time  BP 118/76 05/25/21 1030  Temp 36.5 C 05/25/21 1030  Pulse 86 05/25/21 1032  Resp 11 05/25/21 1032  SpO2 98 % 05/25/21 1032  Vitals shown include unvalidated device data.  Last Pain:  Vitals:   05/25/21 1030  TempSrc: Oral  PainSc: 0-No pain         Complications: No notable events documented.

## 2021-05-25 NOTE — Anesthesia Procedure Notes (Signed)
Procedure Name: Intubation Date/Time: 05/25/2021 10:06 AM Performed by: Lyndal Pulley, CRNA Pre-anesthesia Checklist: Patient identified, Emergency Drugs available, Suction available and Patient being monitored Patient Re-evaluated:Patient Re-evaluated prior to induction Oxygen Delivery Method: Circle system utilized Preoxygenation: Pre-oxygenation with 100% oxygen Induction Type: IV induction Ventilation: Mask ventilation without difficulty Laryngoscope Size: Mac and 4 Grade View: Grade I Tube type: Oral Tube size: 7.5 mm Number of attempts: 1 Airway Equipment and Method: Stylet and Oral airway Placement Confirmation: ETT inserted through vocal cords under direct vision, positive ETCO2 and breath sounds checked- equal and bilateral Secured at: 22 cm Tube secured with: Tape Dental Injury: Teeth and Oropharynx as per pre-operative assessment

## 2021-05-25 NOTE — Discharge Instructions (Signed)
YOU HAD AN ENDOSCOPIC PROCEDURE TODAY: Refer to the procedure report and other information in the discharge instructions given to you for any specific questions about what was found during the examination. If this information does not answer your questions, please call [facility] office at [phone] to clarify.   YOU SHOULD EXPECT: Some feelings of bloating in the abdomen. Passage of more gas than usual. Walking can help get rid of the air that was put into your GI tract during the procedure and reduce the bloating. If you had a lower endoscopy (such as a colonoscopy or flexible sigmoidoscopy) you may notice spotting of blood in your stool or on the toilet paper. Some abdominal soreness may be present for a day or two, also.  DIET: Your first meal following the procedure should be a light meal and then it is ok to progress to your normal diet. A half-sandwich or bowl of soup is an example of a good first meal. Heavy or fried foods are harder to digest and may make you feel nauseous or bloated. Drink plenty of fluids but you should avoid alcoholic beverages for 24 hours. If you had a esophageal dilation, please see attached instructions for diet.    ACTIVITY: Your care partner should take you home directly after the procedure. You should plan to take it easy, moving slowly for the rest of the day. You can resume normal activity the day after the procedure however YOU SHOULD NOT DRIVE, use power tools, machinery or perform tasks that involve climbing or major physical exertion for 24 hours (because of the sedation medicines used during the test).   SYMPTOMS TO REPORT IMMEDIATELY: A gastroenterologist can be reached at any hour. Please call [phone]  for any of the following symptoms:   Following upper endoscopy (EGD, EUS, ERCP, esophageal dilation) Vomiting of blood or coffee ground material  New, significant abdominal pain  New, significant chest pain or pain under the shoulder blades  Painful or  persistently difficult swallowing  New shortness of breath  Black, tarry-looking or red, bloody stools  FOLLOW UP:  If any biopsies were taken you will be contacted by phone or by letter within the next 1-3 weeks. Call [phone]  if you have not heard about the biopsies in 3 weeks.  Please also call with any specific questions about appointments or follow up tests.  

## 2021-05-25 NOTE — ED Notes (Signed)
Patient reports eating around 6pm. Since thin patient states he feels ike something in stuck in hs throat. Patient has a history esophageal stretching.

## 2021-05-25 NOTE — Consult Note (Addendum)
Attending physician's note   I have taken an interval history, reviewed the chart and examined the patient. I agree with the Advanced Practitioner's note, impression, and recommendations as outlined.   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  Last EGD was 06/2020 with dilation of esophageal stricture with 15 mm Savary dilator.  Reviewed prior EGDs and he has persistent erosive esophagitis and esophageal ulceration despite being on high-dose PPI and H2 RA.  He has been taking his Protonix 40 mg twice daily and Pepcid at bedtime as prescribed.  Does have 1 prior episode of esophageal food impaction requiring emergent EGD, approximately 5-6 years ago per patient.  - Plan for emergent EGD this morning for esophageal food impaction removal - Will require short interval follow-up in the GI clinic with continued esophageal dilations  [name], DO, FACG [phone] office             Consultation  Referring Provider:  ER MD [facility] Primary Care Physician:  [name], Provider, MD Primary Gastroenterologist:  Dr.Stark  Reason for Consultation:  Food impaction  HPI: Autrey Fortino is a 40 y.o. male, known to Dr. Fuller Plan with history of chronic GERD, ulcerative esophagitis and recurrent esophageal stricture.  He also has long segment Barrett's esophagus.  Patient has had several previous esophageal dilations.  He presented to the emergency room early this morning with acute dysphagia.  He says he ate a pork chop and rice for dinner last night, feels that he got the meat lodged in his esophagus and has been unable to swallow since.  He is unable to swallow his saliva.  He had glucagon in the ER without change in symptoms.  He is complaining of mild discomfort.  He says he had been  doing well over the past multiple months but did have a mild episode of transient dysphagia last week.  He has not been having any heartburn or indigestion. He has been diligent about taking his Protonix 40 mg twice daily AC breakfast and AC dinner and has been taking Pepcid at bedtime. Patient last had EGD in September 2021 with finding of a long segment Barrett's esophagus, 3 cratered esophageal ulcers the largest 10 mm and grade C esophagitis.  There was a distal esophageal stricture measuring 1.2 cm, he underwent Savary dilation to 15 mm.  Patient also has history of iron deficiency anemia, has followed with oncology.  This may be secondary to chronic losses from his esophagus. He says he had an ER visit in May 2022 with shortness of breath and syncope which was attributed to asthma.  He has an albuterol inhaler that he uses as needed at home now. Prior history of substance abuse, on methadone.  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 and a diffusely markedly thickened esophagus with mucosal hyperemia and a fluid-filled lumen, associated paraesophageal lymph nodes noted to be enlarged measuring up to 1.1 cm, small hiatal hernia   Past Medical History:  Diagnosis Date   Anemia 10/23/2014   Anxiety    Back pain    Blood transfusion without reported diagnosis april 2016/feb 2020   Breathing difficulty 12/13/2019   went  to ER and felt unable to breathe   Esophageal stricture 03/2014   Esophageal ulcer    GERD (gastroesophageal reflux disease)    Hiatal hernia    Reflux    Substance abuse ([place])    on methadone   Ulcerative esophagitis 03/2014   severe    Past Surgical History:  Procedure Laterality Date   BALLOON DILATION N/A 05/20/2015   Procedure: BALLOON DILATION;  Surgeon: Milus Banister, MD;  Location: [facility];  Service: Endoscopy;  Laterality: N/A;   COLONOSCOPY  2018   ESOPHAGEAL DILATION     On 3 occasions: 03/2014, 08/2014, 09/2014   ESOPHAGOGASTRODUODENOSCOPY N/A 05/20/2015   Procedure: ESOPHAGOGASTRODUODENOSCOPY (EGD);  Surgeon: Milus Banister, MD;  Location: [facility];  Service: Endoscopy;  Laterality: N/A;   ESOPHAGOGASTRODUODENOSCOPY (EGD) WITH PROPOFOL N/A 01/18/2015   Procedure: ESOPHAGOGASTRODUODENOSCOPY (EGD) WITH PROPOFOL;  Surgeon: Lafayette Dragon, MD;  [facility];  Service: Endoscopy;  Laterality: N/A;   ESOPHAGOGASTRODUODENOSCOPY (EGD) WITH PROPOFOL N/A 09/04/2017   Procedure: ESOPHAGOGASTRODUODENOSCOPY (EGD) WITH PROPOFOL;  Surgeon: Ladene Artist, MD;  Location: [facility];  Service: Endoscopy;  Laterality: N/A;  need fluro   ESOPHAGOGASTRODUODENOSCOPY (EGD) WITH PROPOFOL N/A 10/01/2017   Procedure: ESOPHAGOGASTRODUODENOSCOPY (EGD) WITH PROPOFOL;  Surgeon: Ladene Artist, MD;  [facility];  Service: Endoscopy;  Laterality: N/A;   HAND SURGERY Right    SAVORY DILATION N/A 09/04/2017   Procedure: SAVORY DILATION;  Surgeon: Ladene Artist, MD;  [facility];  Service: Endoscopy;  Laterality: N/A;   SAVORY DILATION N/A 10/01/2017   Procedure: SAVORY DILATION;  Surgeon: Ladene Artist, MD;  Location[facility];  Service: Endoscopy;  Laterality: N/A;   UPPER GASTROINTESTINAL ENDOSCOPY  11/27/2019    Prior to Admission medications   Medication Sig Start Date End Date Taking? Authorizing Provider  albuterol (VENTOLIN HFA) 108 (90 Base) MCG/ACT inhaler Inhale 2 puffs into the lungs every 4 (four) hours as needed for wheezing or shortness of breath. 123456  Yes Delora Fuel, MD  famotidine (PEPCID) 20 MG tablet TAKE 1 TABLET(20 MG) BY MOUTH AT BEDTIME Patient taking differently: Take 40 mg by mouth at bedtime as needed for heartburn or indigestion. 04/29/20  Yes Ladene Artist, MD  pantoprazole (PROTONIX) 40  MG tablet TAKE 1 TABLET(40 MG) BY MOUTH TWICE DAILY BEFORE A MEAL Patient taking differently: Take 40 mg by mouth 2 (two) times daily before a meal. 10/01/20  Yes Ladene Artist, MD  Phenyleph-CPM-DM-APAP (TYLENOL COLD HEAD CONGESTION PO) Take 2 tablets by mouth 2 (two) times daily as needed (congestion).   Yes [provider]  promethazine (PHENERGAN) 12.5 MG tablet TAKE 1 TABLET(12.5 MG) BY MOUTH THREE TIMES DAILY AS NEEDED FOR NAUSEA OR VOMITING Patient taking differently: Take 12.5 mg by mouth 3 (three) times daily as needed for nausea or vomiting. 07/12/20  Yes Ladene Artist, MD  methadone (DOLOPHINE) 10 MG/ML solution Take 80 mg by mouth daily. Patient not taking: Reported on 05/25/2021    [provider]  predniSONE (DELTASONE) 50 MG tablet Take 1 tablet (50 mg total) by mouth daily. Patient not taking: Reported on [id number]   Delora Fuel, MD    Current Facility-Administered Medications  Medication Dose Route Frequency Provider Last Rate Last Admin   lactated ringers infusion    Continuous PRN Johney Perotti V, DO 10 mL/hr at 05/25/21 0840 1,000 mL at 05/25/21 0840    Allergies as of 05/25/2021 - Review Complete 05/25/2021  Allergen  Reaction Noted   Atarax [hydroxyzine] Anaphylaxis 07/21/2020   Hydrocodone Nausea And Vomiting 09/16/2015    Family History  Problem Relation Age of Onset   Hypertension Mother    Hypertension Father    Colon cancer Neg Hx    Esophageal cancer Neg Hx    Rectal cancer Neg Hx    Stomach cancer Neg Hx    Colon polyps Neg Hx    Liver cancer Neg Hx    Pancreatic cancer Neg Hx     Social History   Socioeconomic History   Marital status: Single    Spouse name: Not on file   Number of children: Not on file   Years of education: Not on file   Highest education level: Not on file  Occupational History   Not on file  Tobacco Use   Smoking status: Never   Smokeless tobacco: Never  Vaping Use   Vaping Use: Never  used  Substance and Sexual Activity   Alcohol use: Not Currently   Drug use: Not Currently    Types: Heroin    Comment: currently taking Methadone   Sexual activity: Not on file  Other Topics Concern   Not on file  Social History Narrative   Not on file   Social Determinants of Health   Financial Resource Strain: Not on file  Food Insecurity: Not on file  Transportation Needs: Not on file  Physical Activity: Not on file  Stress: Not on file  Social Connections: Not on file  Intimate Partner Violence: Not on file    Review of Systems: Pertinent positive and negative review of systems were noted in the above HPI section.  All other review of systems was otherwise negative.   Physical Exam: Vital signs in last 24 hours: Temp:  [97.8 F (36.6 C)-98.3 F (36.8 C)] 98.3 F (36.8 C) (08/10 0837) Pulse Rate:  [75-97] 75 (08/10 0837) Resp:  [14-16] 14 (08/10 0837) BP: (103-146)/(78-98) 146/98 (08/10 0837) SpO2:  [90 %-99 %] 99 % (08/10 0837) Weight:  [72.6 kg] 72.6 kg (08/10 0628)   General:   Alert,  Well-developed, well-nourished, white male pleasant and cooperative in NAD Head:  Normocephalic and atraumatic. Eyes:  Sclera clear, no icterus.   Conjunctiva pink. Ears:  Normal auditory acuity. Nose:  No deformity, discharge,  or lesions. Mouth:  No deformity or lesions.   Neck:  Supple; no masses or thyromegaly. Lungs: Scattered rhonchi and wheezes bilaterally  heart:  Regular rate and rhythm; no murmurs, clicks, rubs,  or gallops. Abdomen:  Soft,nontender, BS active,nonpalp mass or hsm.   Rectal:  Deferred  Msk:  Symmetrical without gross deformities. . Pulses:  Normal pulses noted. Extremities:  Without clubbing or edema. Neurologic:  Alert and  oriented x4;  grossly normal neurologically. Skin:  Intact without significant lesions or rashes.. Psych:  Alert and cooperative. Normal mood and affect.  Intake/Output from previous day: No intake/output data  recorded. Intake/Output this shift: No intake/output data recorded.  Lab Results: Recent Labs    05/25/21 0656  WBC 6.2  HGB 12.0*  HCT 41.8  PLT 399   BMET Recent Labs    05/25/21 0656  NA 140  K 4.3  CL 102  CO2 34*  GLUCOSE 110*  BUN 14  CREATININE 1.12  CALCIUM 9.3   LFT Recent Labs    05/25/21 0656  PROT 8.1  ALBUMIN 4.1  AST 18  ALT 14  ALKPHOS 87  BILITOT 0.2*   PT/INR No results  for input(s): LABPROT, INR in the last 72 hours. Hepatitis Panel No results for input(s): HEPBSAG, HCVAB, HEPAIGM, HEPBIGM in the last 72 hours.    IMPRESSION:  #64 40 year old white male with history of long segment Barrett's esophagus, chronic GERD, ulcerative esophagitis and distal esophageal stricture which has required several dilations in the past. Patient presents now with acute food impaction which occurred with dinner last night (pork chops and rice)  #2 abnormal chest CT May 2022 showing diffuse esophagitis, fluid-filled esophageal lumen and associated paraesophageal lymphadenopathy.  This is despite twice daily PPI and at bedtime H2 blocker Patient due for surveillance EGD with biopsies December 2022 with history of long segment Barrett's will need to rule out malignancy  #3 asthma-some wheezing this morning, was recently prescribed albuterol which he has used as needed  #4 history of iron deficiency anemia    PLAN: Patient will undergo EGD with Dr. Bryan Lemma this morning with removal of food impaction .  He will need to be scheduled for EGD with esophageal dilation, very soon with Dr. Fuller Plan, and biopsy at that time.  Soft diet Strict adherence to twice daily Protonix AC breakfast and AC dinner and Pepcid 40 mg at bedtime. This regimen does not appear to be working well.  Once malignancy has been excluded, may need to be considered for an antireflux procedure.   Amy Esterwood PA-C 05/25/2021, 8:43 AM

## 2021-05-25 NOTE — ED Notes (Signed)
Patient attempted to use the urinal to void, was unsuccessful.

## 2021-05-26 ENCOUNTER — Encounter (HOSPITAL_COMMUNITY): Payer: Self-pay | Admitting: Gastroenterology

## 2021-06-03 ENCOUNTER — Encounter: Payer: Self-pay | Admitting: Gastroenterology

## 2021-06-07 ENCOUNTER — Other Ambulatory Visit: Payer: Self-pay

## 2021-06-07 ENCOUNTER — Ambulatory Visit (AMBULATORY_SURGERY_CENTER): Payer: Self-pay | Admitting: Gastroenterology

## 2021-06-07 ENCOUNTER — Encounter: Payer: Self-pay | Admitting: Gastroenterology

## 2021-06-07 VITALS — BP 100/65 | HR 68 | Temp 97.7°F | Resp 11 | Ht 71.0 in | Wt 174.0 lb

## 2021-06-07 DIAGNOSIS — K2100 Gastro-esophageal reflux disease with esophagitis, without bleeding: Secondary | ICD-10-CM

## 2021-06-07 DIAGNOSIS — R1319 Other dysphagia: Secondary | ICD-10-CM

## 2021-06-07 DIAGNOSIS — K222 Esophageal obstruction: Secondary | ICD-10-CM

## 2021-06-07 DIAGNOSIS — K2210 Ulcer of esophagus without bleeding: Secondary | ICD-10-CM

## 2021-06-07 MED ORDER — SODIUM CHLORIDE 0.9 % IV SOLN: 500.0000 mL | Freq: Once | INTRAVENOUS | Status: DC

## 2021-06-07 NOTE — Progress Notes (Signed)
Called to room to assist during endoscopic procedure.  Patient ID and intended procedure confirmed with present staff. Received instructions for my participation in the procedure from the performing physician.  

## 2021-06-07 NOTE — Patient Instructions (Addendum)
Handout provided on post esophageal dilation diet, Barrett's Esophagus, and Hiatal Hernia.  Follow antireflux measures.    YOU HAD AN ENDOSCOPIC PROCEDURE TODAY AT [facility]:   Refer to the procedure report that was given to you for any specific questions about what was found during the examination.  If the procedure report does not answer your questions, please call your gastroenterologist to clarify.  If you requested that your care partner not be given the details of your procedure findings, then the procedure report has been included in a sealed envelope for you to review at your convenience later.  YOU SHOULD EXPECT: Some feelings of bloating in the abdomen. Passage of more gas than usual.  Walking can help get rid of the air that was put into your GI tract during the procedure and reduce the bloating. If you had a lower endoscopy (such as a colonoscopy or flexible sigmoidoscopy) you may notice spotting of blood in your stool or on the toilet paper. If you underwent a bowel prep for your procedure, you may not have a normal bowel movement for a few days.  Please Note:  You might notice some irritation and congestion in your nose or some drainage.  This is from the oxygen used during your procedure.  There is no need for concern and it should clear up in a day or so.  SYMPTOMS TO REPORT IMMEDIATELY:  Following upper endoscopy (EGD)  Vomiting of blood or coffee ground material  New chest pain or pain under the shoulder blades  Painful or persistently difficult swallowing  New shortness of breath  Fever of 100F or higher  Black, tarry-looking stools  For urgent or emergent issues, a gastroenterologist can be reached at any hour by calling [phone]. Do not use MyChart messaging for urgent concerns.    DIET:  Post dilation diet: Clear liquids for 2 hours until 4:15 pm and advance as tolerated to a soft diet (see handout) for the remainder of the day.  Drink plenty of  fluids but you should avoid alcoholic beverages for 24 hours.  ACTIVITY:  You should plan to take it easy for the rest of today and you should NOT DRIVE or use heavy machinery until tomorrow (because of the sedation medicines used during the test).    FOLLOW UP: Our staff will call the number listed on your records 48-72 hours following your procedure to check on you and address any questions or concerns that you may have regarding the information given to you following your procedure. If we do not reach you, we will leave a message.  We will attempt to reach you two times.  During this call, we will ask if you have developed any symptoms of COVID 19. If you develop any symptoms (ie: fever, flu-like symptoms, shortness of breath, cough etc.) before then, please call [phone].  If you test positive for Covid 19 in the 2 weeks post procedure, please call and report this information to [place].    If any biopsies were taken you will be contacted by phone or by letter within the next 1-3 weeks.  Please call us at [phone] if you have not heard about the biopsies in 3 weeks.    SIGNATURES/CONFIDENTIALITY: You and/or your care partner have signed paperwork which will be entered into your electronic medical record.  These signatures attest to the fact that that the information above on your After Visit Summary has been reviewed and is understood.  Full  responsibility of the confidentiality of this discharge information lies with you and/or your care-partner.

## 2021-06-07 NOTE — Op Note (Addendum)
[facility] Patient Name: Eric Hood Procedure Date: 06/07/2021 1:28 PM MRN: [id number] Endoscopist: Ladene Artist , MD Age: 40 Referring MD:  Date of Birth: [birth year] Gender: Male Account #: [id number] Procedure:                Upper GI endoscopy Indications:              Dysphagia, Stenosis of the esophagus Medicines:                Monitored Anesthesia Care Procedure:                Pre-Anesthesia Assessment:                           - Prior to the procedure, a History and Physical                            was performed, and patient medications and                            allergies were reviewed. The patient's tolerance of                            previous anesthesia was also reviewed. The risks                            and benefits of the procedure and the sedation                            options and risks were discussed with the patient.                            All questions were answered, and informed consent                            was obtained. Prior Anticoagulants: The patient has                            taken no previous anticoagulant or antiplatelet                            agents. ASA Grade Assessment: II - A patient with                            mild systemic disease. After reviewing the risks                            and benefits, the patient was deemed in                            satisfactory condition to undergo the procedure.                           After obtaining informed consent, the endoscope was  passed under direct vision. Throughout the                            procedure, the patient's blood pressure, pulse, and                            oxygen saturations were monitored continuously. The                            GIF [id number] was introduced through the                            mouth, and advanced to the second part of duodenum.                            The upper GI endoscopy  was accomplished without                            difficulty. The patient tolerated the procedure                            well. Scope In: Scope Out: Findings:                 One benign-appearing, intrinsic severe stenosis was                            found 26 cm from the incisors. This stenosis                            measured 9 mm (inner diameter) x less than one cm                            (in length). The stenosis was traversed after                            dilation. A guidewire was placed and the scope was                            withdrawn. Dilation was performed with a Savary                            dilator with no resistance at 10 mm. A guidewire                            was placed and the scope was withdrawn. Dilation                            was performed with a Savary dilator with mild                            resistance at 12 mm, 13 mm, 14 mm and 15 mm. Small  heme noted on last 3 dilators. The dilation site                            was examined following endoscope reinsertion and                            showed moderate mucosal disruption and moderate                            improvement in luminal narrowing.                           There were esophageal mucosal changes secondary to                            established long-segment Barrett's disease present                            in the mid esophagus and in the distal esophagus.                            The maximum longitudinal extent of these mucosal                            changes was 10 cm in length.                           Two superficial esophageal ulcers were found in the                            distal esophagus.                           The exam of the esophagus was otherwise normal.                           A medium-sized hiatal hernia was present.                           Single small erosion in the gastric body. The exam                             of the stomach was otherwise normal.                           The duodenal bulb and second portion of the                            duodenum were normal. Complications:            No immediate complications. Estimated Blood Loss:     Estimated blood loss was minimal. Impression:               - Benign-appearing esophageal stenosis. Dilated.                           -  Esophageal mucosal changes secondary to                            established long-segment Barrett's disease.                           - Esophageal ulcers, distal.                           - Medium-sized hiatal hernia.                           - Normal duodenal bulb and second portion of the                            duodenum.                           - No specimens collected. Recommendation:           - Patient has a contact number available for                            emergencies. The signs and symptoms of potential                            delayed complications were discussed with the                            patient. Return to normal activities tomorrow.                            Written discharge instructions were provided to the                            patient.                           - Clear liquid diet for 2 hours, then advance as                            tolerated to soft diet today.                           - Resume prior diet tomorrow.                           - Follow antireflux meausres.                           - Continue present medications.                           - Return to GI office in 6 weeks. Ladene Artist, MD 06/07/2021 2:24:06 PM This report has been signed electronically.

## 2021-06-07 NOTE — Progress Notes (Signed)
Pt in recovery with monitors in place, VSS. Report given to receiving RN. Bite guard was placed with pt awake to ensure comfort. No dental or soft tissue damage noted. RN will remove the guard when the pt is awake.  

## 2021-06-07 NOTE — Progress Notes (Signed)
History & Physical  Primary Care Physician:  [name], Provider, MD Primary Gastroenterologist: M. Fuller Plan, MD  CHIEF COMPLAINT: Dysphagia, esophageal stricture.  HPI: Eric Hood is a 40 y.o. male who presents for EGD with esophageal dilation for esophageal stricture with recurrent dysphagia.  He has a history of Barrett's esophagus.   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   Ulcerative esophagitis 03/2014   severe    Past Surgical History:  Procedure Laterality Date   BALLOON DILATION N/A 05/20/2015   Procedure: BALLOON DILATION;  Surgeon: Milus Banister, MD;  Location: [facility];  Service: Endoscopy;  Laterality: N/A;   COLONOSCOPY  2018   ESOPHAGEAL DILATION     On 3 occasions: 03/2014, 08/2014, 09/2014   ESOPHAGOGASTRODUODENOSCOPY N/A 05/20/2015   Procedure: ESOPHAGOGASTRODUODENOSCOPY (EGD);  Surgeon: Milus Banister, MD;  Location: [facility];  Service: Endoscopy;  Laterality: N/A;   ESOPHAGOGASTRODUODENOSCOPY Left 05/25/2021   Procedure: ESOPHAGOGASTRODUODENOSCOPY (EGD);  Surgeon: Lavena Bullion, DO;  Location: [facility];  Service: Gastroenterology;  Laterality: Left;   ESOPHAGOGASTRODUODENOSCOPY (EGD) WITH PROPOFOL N/A 01/18/2015   Procedure: ESOPHAGOGASTRODUODENOSCOPY (EGD) WITH PROPOFOL;  Surgeon: Lafayette Dragon, MD;  Location: [facility];  Service: Endoscopy;  Laterality: N/A;   ESOPHAGOGASTRODUODENOSCOPY (EGD) WITH PROPOFOL N/A 09/04/2017   Procedure: ESOPHAGOGASTRODUODENOSCOPY (EGD) WITH PROPOFOL;  Surgeon: Ladene Artist, MD;  [facility];  Service: Endoscopy;   Laterality: N/A;  need fluro   ESOPHAGOGASTRODUODENOSCOPY (EGD) WITH PROPOFOL N/A 10/01/2017   Procedure: ESOPHAGOGASTRODUODENOSCOPY (EGD) WITH PROPOFOL;  Surgeon: Ladene Artist, MD;  [facility];  Service: Endoscopy;  Laterality: N/A;   FOREIGN BODY REMOVAL  05/25/2021   Procedure: FOREIGN BODY REMOVAL;  Surgeon: Lavena Bullion, DO;  Location: [facility];  Service: Gastroenterology;;   HAND SURGERY Right    SAVORY DILATION N/A 09/04/2017   Procedure: SAVORY DILATION;  Surgeon: Ladene Artist, MD;  Location[facility];  Service: Endoscopy;  Laterality: N/A;   SAVORY DILATION N/A 10/01/2017   Procedure: SAVORY DILATION;  Surgeon: Ladene Artist, MD;  Location[facility];  Service: Endoscopy;  Laterality: N/A;   UPPER GASTROINTESTINAL ENDOSCOPY  11/27/2019    Prior to Admission medications   Medication Sig Start Date End Date Taking? Authorizing Provider  methadone (DOLOPHINE) 10 MG/ML solution Take 80 mg by mouth daily.   Yes [provider]  pantoprazole (PROTONIX) 40 MG tablet TAKE 1 TABLET(40 MG) BY MOUTH TWICE DAILY BEFORE A MEAL 10/01/20  Yes Ladene Artist, MD  albuterol (VENTOLIN HFA) 108 (90 Base) MCG/ACT inhaler Inhale 2 puffs into the lungs every 4 (four) hours as needed for wheezing or shortness of breath. [id number]   Delora Fuel, MD  famotidine (PEPCID) 20 MG tablet TAKE 1 TABLET(20 MG) BY MOUTH AT BEDTIME Patient taking differently: Take 40 mg by mouth at bedtime as needed for heartburn or indigestion. 04/29/20   Ladene Artist, MD  Phenyleph-CPM-DM-APAP (TYLENOL COLD HEAD CONGESTION PO) Take 2 tablets by mouth 2 (two) times daily as needed (congestion).  [provider]  promethazine (PHENERGAN) 12.5 MG tablet TAKE 1 TABLET(12.5 MG) BY MOUTH THREE TIMES DAILY AS NEEDED FOR NAUSEA OR VOMITING Patient taking differently: Take 12.5 mg by mouth 3 (three) times daily as needed for nausea or vomiting. 07/12/20   Ladene Artist, MD   sucralfate (CARAFATE) 1 GM/10ML suspension Take 10 mLs (1 g total) by mouth 4 (four) times daily. 05/25/21 05/25/22  Cirigliano, Dominic Pea, DO    Current Outpatient Medications  Medication Sig Dispense Refill   methadone (DOLOPHINE) 10 MG/ML solution Take 80 mg by mouth daily.     pantoprazole (PROTONIX) 40 MG tablet TAKE 1 TABLET(40 MG) BY MOUTH TWICE DAILY BEFORE A MEAL 60 tablet 5   albuterol (VENTOLIN HFA) 108 (90 Base) MCG/ACT inhaler Inhale 2 puffs into the lungs every 4 (four) hours as needed for wheezing or shortness of breath. 1 each 0   famotidine (PEPCID) 20 MG tablet TAKE 1 TABLET(20 MG) BY MOUTH AT BEDTIME (Patient taking differently: Take 40 mg by mouth at bedtime as needed for heartburn or indigestion.) 90 tablet 1   Phenyleph-CPM-DM-APAP (TYLENOL COLD HEAD CONGESTION PO) Take 2 tablets by mouth 2 (two) times daily as needed (congestion).     promethazine (PHENERGAN) 12.5 MG tablet TAKE 1 TABLET(12.5 MG) BY MOUTH THREE TIMES DAILY AS NEEDED FOR NAUSEA OR VOMITING (Patient taking differently: Take 12.5 mg by mouth 3 (three) times daily as needed for nausea or vomiting.) 60 tablet 2   sucralfate (CARAFATE) 1 GM/10ML suspension Take 10 mLs (1 g total) by mouth 4 (four) times daily. 420 mL 1   Current Facility-Administered Medications  Medication Dose Route Frequency Provider Last Rate Last Admin   0.9 %  sodium chloride infusion  500 mL Intravenous Once Ladene Artist, MD        Allergies as of 06/07/2021 - Review Complete 06/07/2021  Allergen Reaction Noted   Atarax [hydroxyzine] Anaphylaxis 07/21/2020   Hydrocodone Nausea And Vomiting 09/16/2015    Family History  Problem Relation Age of Onset   Hypertension Mother    Hypertension Father    Colon cancer Neg Hx    Esophageal cancer Neg Hx    Rectal cancer Neg Hx    Stomach cancer Neg Hx    Colon polyps Neg Hx    Liver cancer Neg Hx    Pancreatic cancer Neg Hx     Social History   Socioeconomic History   Marital  status: Single    Spouse name: Not on file   Number of children: Not on file   Years of education: Not on file   Highest education level: Not on file  Occupational History   Not on file  Tobacco Use   Smoking status: Never   Smokeless tobacco: Never  Vaping Use   Vaping Use: Never used  Substance and Sexual Activity   Alcohol use: Not Currently   Drug use: Not Currently    Types: Heroin    Comment: currently taking Methadone   Sexual activity: Not on file  Other Topics Concern   Not on file  Social History Narrative   Not on file   Social Determinants of Health   Financial Resource Strain: Not on file  Food Insecurity: Not on file  Transportation Needs: Not on file  Physical Activity: Not on file  Stress: Not on file  Social Connections: Not on file  Intimate Partner Violence: Not on file    Review of Systems:  All systems reviewed an  negative except where noted in HPI.  Gen: Denies any fever, chills, sweats, anorexia, fatigue, weakness, malaise, weight loss, and sleep disorder CV: Denies chest pain, angina, palpitations, syncope, orthopnea, PND, peripheral edema, and claudication. Resp: Denies dyspnea at rest, dyspnea with exercise, cough, sputum, wheezing, coughing up blood, and pleurisy. GI: Denies vomiting blood, jaundice, and fecal incontinence.   Denies dysphagia or odynophagia. GU : Denies urinary burning, blood in urine, urinary frequency, urinary hesitancy, nocturnal urination, and urinary incontinence. MS: Denies joint pain, limitation of movement, and swelling, stiffness, low back pain, extremity pain. Denies muscle weakness, cramps, atrophy.  Derm: Denies rash, itching, dry skin, hives, moles, warts, or unhealing ulcers.  Psych: Denies depression, anxiety, memory loss, suicidal ideation, hallucinations, paranoia, and confusion. Heme: Denies bruising, bleeding, and enlarged lymph nodes. Neuro:  Denies any headaches, dizziness, paresthesias. Endo:  Denies any  problems with DM, thyroid, adrenal function.  Physical Exam: Vital signs in last 24 hours: General:  Alert, well-developed, in NAD Head:  Normocephalic and atraumatic. Eyes:  Sclera clear, no icterus.   Conjunctiva pink. Ears:  Normal auditory acuity. Mouth:  No deformity or lesions.  Neck:  Supple; no masses . Lungs:  Clear throughout to auscultation.   No wheezes, crackles, or rhonchi. No acute distress. Heart:  Regular rate and rhythm; no murmurs. Abdomen:  Soft, nondistended, nontender. No masses, hepatomegaly. No obvious masses.  Normal bowel .    Rectal:  Deferred   Msk:  Symmetrical without gross deformities.. Pulses:  Normal pulses noted. Extremities:  Without edema. Neurologic:  Alert and  oriented x4;  grossly normal neurologically. Skin:  Intact without significant lesions or rashes. Cervical Nodes:  No significant cervical adenopathy. Psych:  Alert and cooperative. Normal mood and affect.   Impression / Plan:   Dysphagia, Barrett's esophagus, history of esophageal stricture.  Patient presents for EGD with esophageal dilation. The risks (including bleeding, perforation, infection, missed lesions, medication reactions and possible hospitalization or surgery if complications occur), benefits, and alternatives to endoscopy with possible biopsy and possible dilation were discussed with the patient and they consent to proceed.     This patient is appropriate for endoscopic procedures in the ambulatory setting.    Pricilla Riffle. Fuller Plan MD 06/07/2021, 1:50 PM

## 2021-06-09 ENCOUNTER — Telehealth: Payer: Self-pay

## 2021-06-09 NOTE — Telephone Encounter (Signed)
  Follow up Call-  Call back number 06/07/2021 07/07/2020 06/01/2020 04/26/2020 12/30/2019 11/27/2019 10/06/2019  Post procedure Call Back phone  # [phone]  Permission to leave phone message Yes Yes Yes Yes Yes Yes Yes  Some recent data might be hidden     Patient questions:  Do you have a fever, pain , or abdominal swelling? No. Pain Score  0 *  Have you tolerated food without any problems? Yes.    Have you been able to return to your normal activities? Yes.    Do you have any questions about your discharge instructions: Diet   No. Medications  No. Follow up visit  No.  Do you have questions or concerns about your Care? No.  Actions: * If pain score is 4 or above: No action needed, pain <4.

## 2021-07-29 ENCOUNTER — Encounter: Payer: Self-pay | Admitting: Gastroenterology

## 2021-10-11 IMAGING — CR DG NECK SOFT TISSUE
2 series · 2 of 2 positions shown · non-contrast
Comparison: Thoracic spine radiographs 1 ANDI view 09/03/2015.

CLINICAL DATA: 39-year-old male choked on porch shot last night
with persistent foreign body sensation.

EXAM:
NECK SOFT TISSUES - 1+ VIEW

[w soft tissue neck lat]
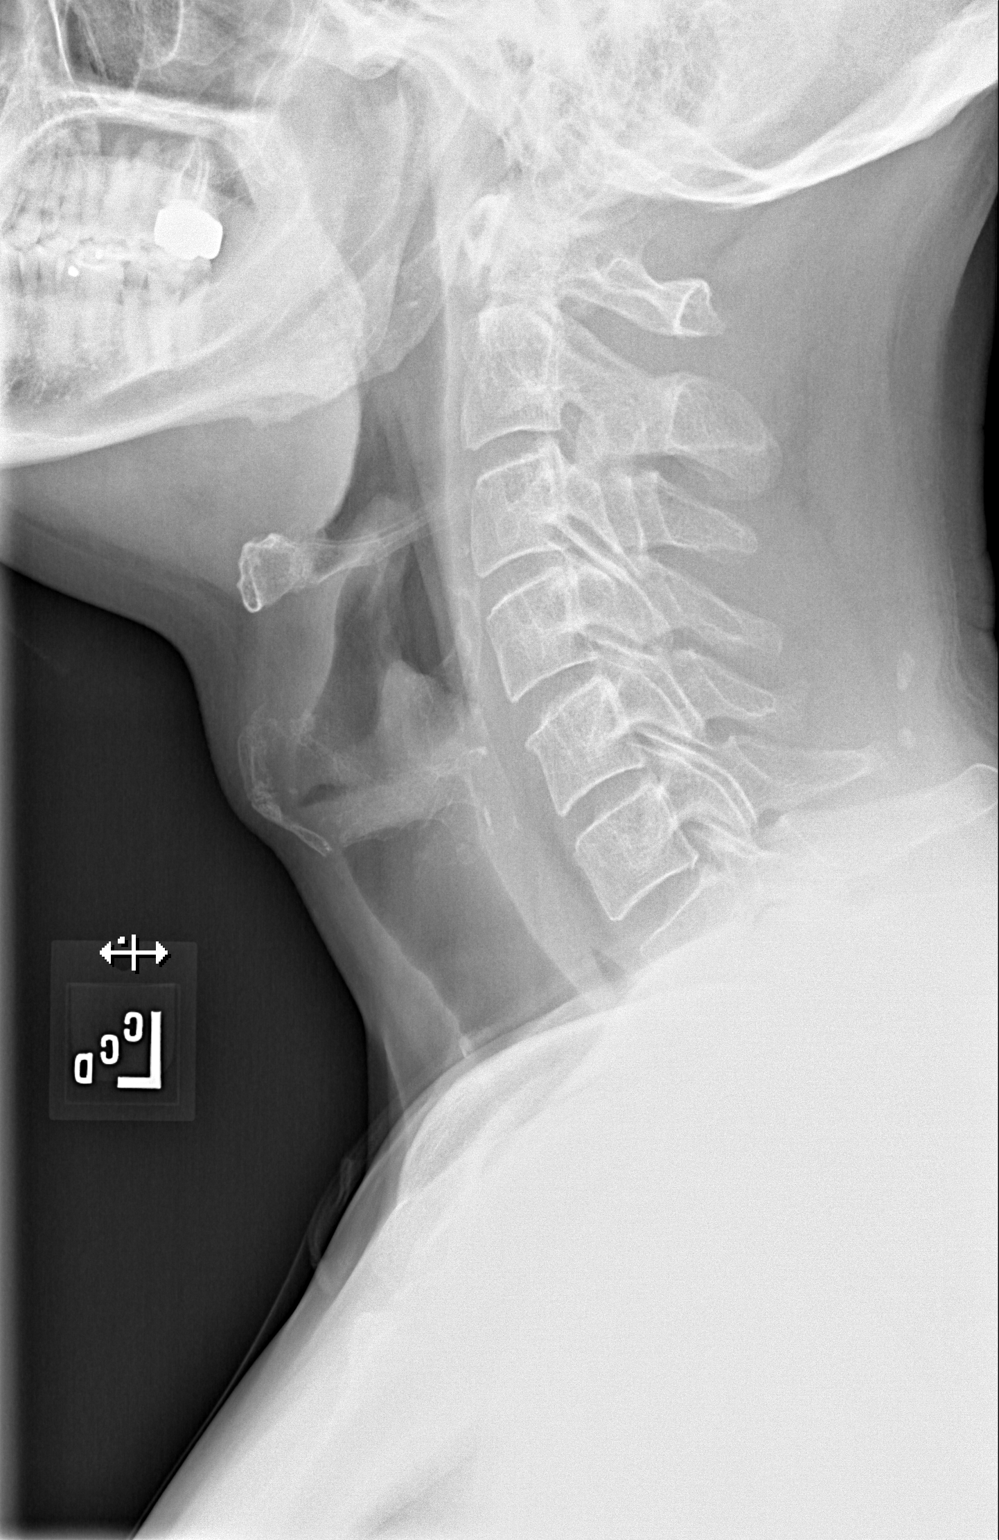

[w soft tissue neck ap]
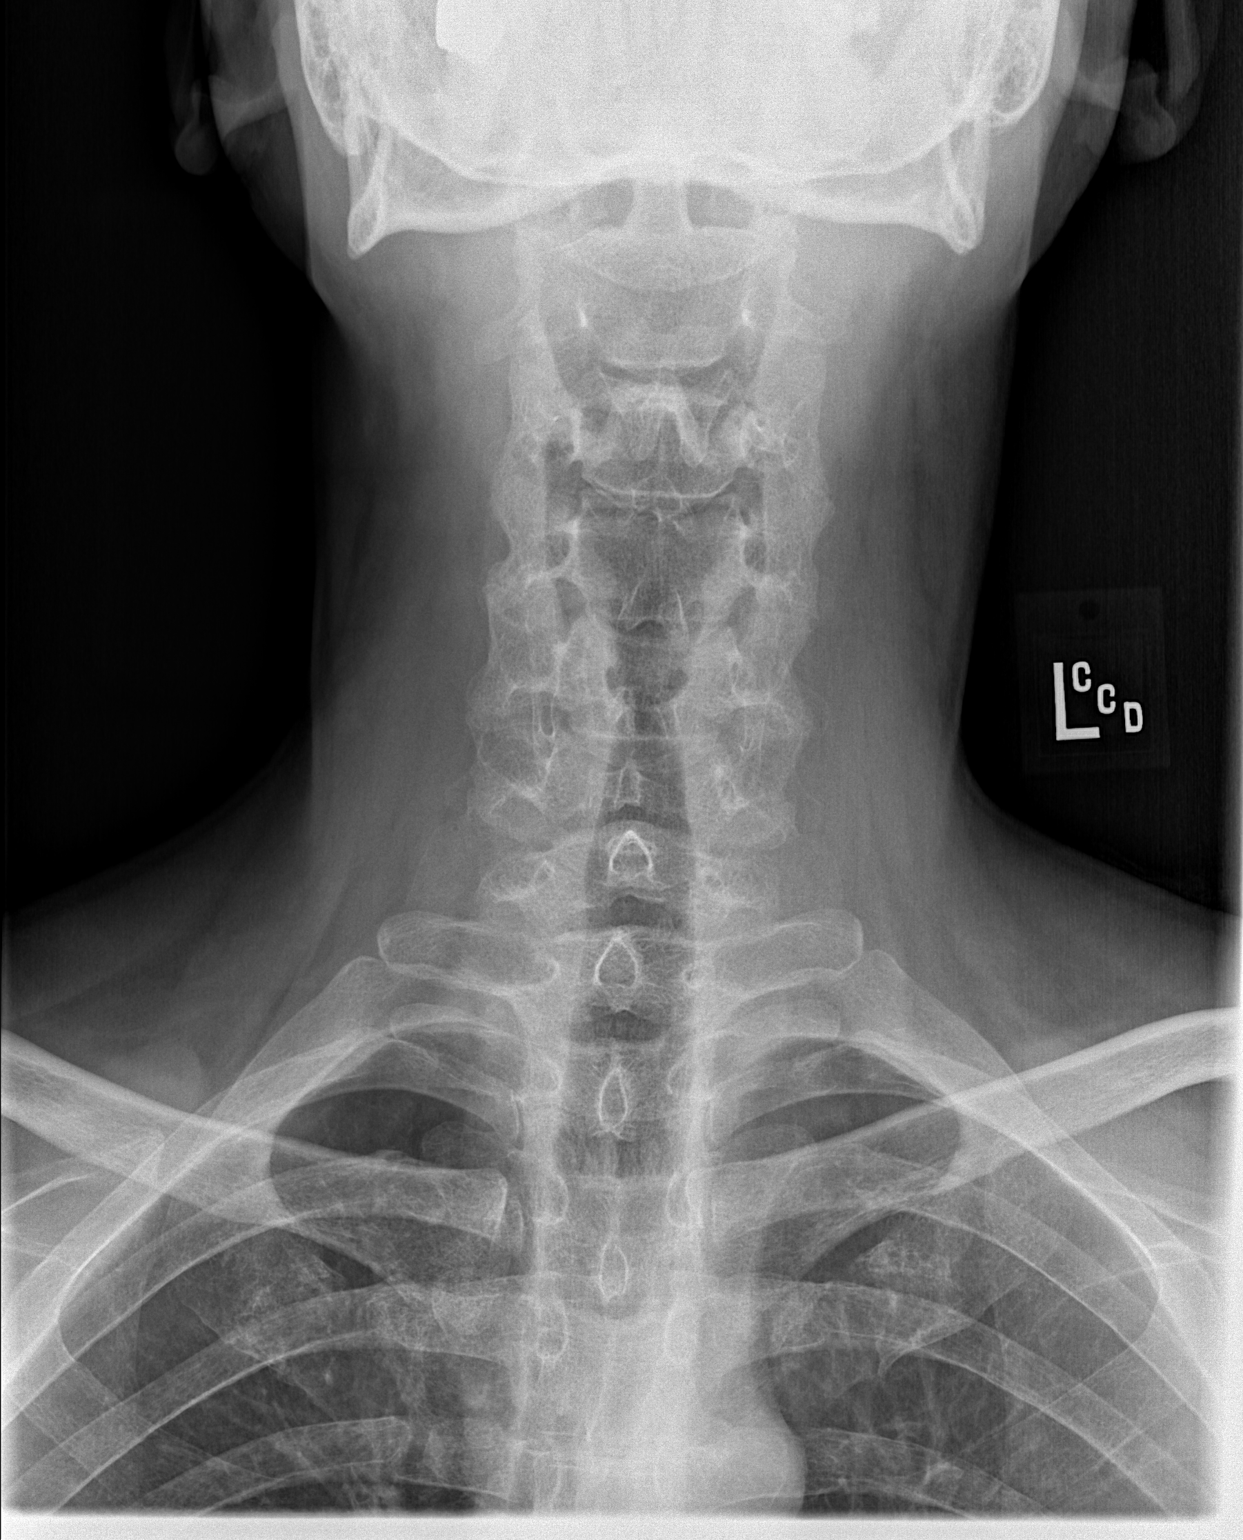

[2 of 2 positions shown; findings below may reference images not displayed]

FINDINGS: Prevertebral soft tissue contours stable and normal. Pharyngeal soft
tissue contours including the epiglottis are within normal limits.
Visualized tracheal air column is within normal limits.

No radiopaque foreign body identified. Negative visible upper chest.
No osseous abnormality identified.
IMPRESSION: Negative. No retained radiopaque foreign body identified.

## 2021-11-15 ENCOUNTER — Encounter: Payer: Self-pay | Admitting: Gastroenterology

## 2021-11-15 ENCOUNTER — Ambulatory Visit: Payer: Self-pay | Admitting: Gastroenterology

## 2021-11-15 VITALS — BP 100/80 | HR 105 | Ht 71.0 in | Wt 165.0 lb

## 2021-11-15 DIAGNOSIS — K219 Gastro-esophageal reflux disease without esophagitis: Secondary | ICD-10-CM

## 2021-11-15 DIAGNOSIS — R1319 Other dysphagia: Secondary | ICD-10-CM

## 2021-11-15 DIAGNOSIS — K2270 Barrett's esophagus without dysplasia: Secondary | ICD-10-CM

## 2021-11-15 DIAGNOSIS — K222 Esophageal obstruction: Secondary | ICD-10-CM

## 2021-11-15 MED ORDER — PANTOPRAZOLE SODIUM 40 MG PO TBEC: DELAYED_RELEASE_TABLET | ORAL | 3 refills | Status: AC

## 2021-11-15 MED ORDER — PROMETHAZINE HCL 12.5 MG PO TABS: 12.5000 mg | ORAL_TABLET | Freq: Three times a day (TID) | ORAL | 2 refills | Status: DC | PRN

## 2021-11-15 MED ORDER — FAMOTIDINE 40 MG PO TABS: 40.0000 mg | ORAL_TABLET | Freq: Every day | ORAL | 3 refills | Status: DC

## 2021-11-15 MED ORDER — PANTOPRAZOLE SODIUM 40 MG PO TBEC: DELAYED_RELEASE_TABLET | ORAL | 3 refills | Status: DC

## 2021-11-15 MED ORDER — FAMOTIDINE 40 MG PO TABS: 40.0000 mg | ORAL_TABLET | Freq: Every day | ORAL | 3 refills | Status: AC

## 2021-11-15 NOTE — Patient Instructions (Signed)
We have sent the following medications to your pharmacy for you to pick up at your convenience: promethazine, famotidine and pantoprazole.   You have been scheduled for an endoscopy. Please follow written instructions given to you at your visit today. If you use inhalers (even only as needed), please bring them with you on the day of your procedure.  The [facility] providers would like to encourage you to use [facility] to communicate with providers for non-urgent requests or questions.  Due to long hold times on the telephone, sending your provider a message by [facility] may be a faster and more efficient way to get a response.  Please allow 48 business hours for a response.  Please remember that this is for non-urgent requests.   Thank you for choosing me and [facility].  Pricilla Riffle. Dagoberto Ligas., MD., [facility]

## 2021-11-15 NOTE — Progress Notes (Signed)
° ° °  History of Present Illness: This is a 41 year old male with a history of refractory, recurrent esophageal strictures.  He relates worsening solid food dysphagia over the past couple months.  His reflux symptoms seems to be well controlled on his current regimen.  EGD 05/2021 - Benign-appearing esophageal stenosis. Dilated to 15 mm. - Esophageal mucosal changes secondary to established long-segment Barrett's disease. - Esophageal ulcers, distal. - Medium-sized hiatal hernia. - Normal duodenal bulb and second portion of the duodenum.  Current Medications, Allergies, Past Medical History, Past Surgical History, Family History and Social History were reviewed in [employer] record.   Physical Exam: General: Well developed, well nourished, no acute distress Head: Normocephalic and atraumatic Eyes: Sclerae anicteric, EOMI Ears: Normal auditory acuity Mouth: Not examined, mask on during Covid-19 pandemic Lungs: Clear throughout to auscultation Heart: Regular rate and rhythm; no murmurs, rubs or bruits Abdomen: Soft, non tender and non distended. No masses, hepatosplenomegaly or hernias noted. Normal Bowel sounds Rectal: Not done Musculoskeletal: Symmetrical with no gross deformities  Pulses:  Normal pulses noted Extremities: No clubbing, cyanosis, edema or deformities noted Neurological: Alert oriented x 4, grossly nonfocal Psychological:  Alert and cooperative. Normal mood and affect   Assessment and Recommendations:  GERD with recurrent esophageal strictures, esophageal ulcers and long segment Barrett's esophagus without dysplasia. His solid food dysphagia is worsening. Closely follow antireflux measures.  Softer foods until EGD with dilation is performed.  Continue pantoprazole 40 mg p.o. twice daily before meals and famotidine 40 mg po at bedtime.  Phenergan 12.5 mg po 3 times daily as needed for nausea, vomiting.  Schedule EGD with dilation. The risks  (including bleeding, perforation, infection, missed lesions, medication reactions and possible hospitalization or surgery if complications occur), benefits, and alternatives to endoscopy with possible biopsy and possible dilation were discussed with the patient and they consent to proceed.

## 2021-12-01 ENCOUNTER — Ambulatory Visit (AMBULATORY_SURGERY_CENTER): Payer: Self-pay | Admitting: Gastroenterology

## 2021-12-01 ENCOUNTER — Other Ambulatory Visit: Payer: Self-pay

## 2021-12-01 ENCOUNTER — Encounter: Payer: Self-pay | Admitting: Gastroenterology

## 2021-12-01 VITALS — BP 104/78 | HR 89 | Temp 98.6°F | Resp 10 | Ht 71.0 in | Wt 165.0 lb

## 2021-12-01 DIAGNOSIS — K2270 Barrett's esophagus without dysplasia: Secondary | ICD-10-CM

## 2021-12-01 DIAGNOSIS — K449 Diaphragmatic hernia without obstruction or gangrene: Secondary | ICD-10-CM

## 2021-12-01 DIAGNOSIS — K222 Esophageal obstruction: Secondary | ICD-10-CM

## 2021-12-01 DIAGNOSIS — K209 Esophagitis, unspecified without bleeding: Secondary | ICD-10-CM

## 2021-12-01 DIAGNOSIS — R1319 Other dysphagia: Secondary | ICD-10-CM

## 2021-12-01 MED ORDER — SODIUM CHLORIDE 0.9 % IV SOLN: 500.0000 mL | Freq: Once | INTRAVENOUS | Status: DC

## 2021-12-01 NOTE — Progress Notes (Signed)
Vss nad trans to pacu °

## 2021-12-01 NOTE — Progress Notes (Signed)
See 11/15/2021 H&P, no changes.

## 2021-12-01 NOTE — Progress Notes (Signed)
Pt's states no medical or surgical changes since previsit or office visit.  VS CW  

## 2021-12-01 NOTE — Patient Instructions (Signed)
Clear liquids for x2 hours and soft foods the rest of today. Follow anti-reflux measures. Continue present medications. Awaiting pathology results. Return to GI office in 3 months.  YOU HAD AN ENDOSCOPIC PROCEDURE TODAY AT [facility]:   Refer to the procedure report that was given to you for any specific questions about what was found during the examination.  If the procedure report does not answer your questions, please call your gastroenterologist to clarify.  If you requested that your care partner not be given the details of your procedure findings, then the procedure report has been included in a sealed envelope for you to review at your convenience later.  YOU SHOULD EXPECT: Some feelings of bloating in the abdomen. Passage of more gas than usual.  Walking can help get rid of the air that was put into your GI tract during the procedure and reduce the bloating. If you had a lower endoscopy (such as a colonoscopy or flexible sigmoidoscopy) you may notice spotting of blood in your stool or on the toilet paper. If you underwent a bowel prep for your procedure, you may not have a normal bowel movement for a few days.  Please Note:  You might notice some irritation and congestion in your nose or some drainage.  This is from the oxygen used during your procedure.  There is no need for concern and it should clear up in a day or so.  SYMPTOMS TO REPORT IMMEDIATELY: Following upper endoscopy (EGD)  Vomiting of blood or coffee ground material  New chest pain or pain under the shoulder blades  Painful or persistently difficult swallowing  New shortness of breath  Fever of 100F or higher  Black, tarry-looking stools  For urgent or emergent issues, a gastroenterologist can be reached at any hour by calling [phone]. Do not use MyChart messaging for urgent concerns.    DIET:  We do recommend a small meal at first, but then you may proceed to your regular diet.  Drink plenty of  fluids but you should avoid alcoholic beverages for 24 hours.  ACTIVITY:  You should plan to take it easy for the rest of today and you should NOT DRIVE or use heavy machinery until tomorrow (because of the sedation medicines used during the test).    FOLLOW UP: Our staff will call the number listed on your records 48-72 hours following your procedure to check on you and address any questions or concerns that you may have regarding the information given to you following your procedure. If we do not reach you, we will leave a message.  We will attempt to reach you two times.  During this call, we will ask if you have developed any symptoms of COVID 19. If you develop any symptoms (ie: fever, flu-like symptoms, shortness of breath, cough etc.) before then, please call [phone].  If you test positive for Covid 19 in the 2 weeks post procedure, please call and report this information to [place].    If any biopsies were taken you will be contacted by phone or by letter within the next 1-3 weeks.  Please call us at [phone] if you have not heard about the biopsies in 3 weeks.    SIGNATURES/CONFIDENTIALITY: You and/or your care partner have signed paperwork which will be entered into your electronic medical record.  These signatures attest to the fact that that the information above on your After Visit Summary has been reviewed and is understood.  Full responsibility  of the confidentiality of this discharge information lies with you and/or your care-partner.

## 2021-12-01 NOTE — Progress Notes (Signed)
Called to room to assist during endoscopic procedure.  Patient ID and intended procedure confirmed with present staff. Received instructions for my participation in the procedure from the performing physician.  

## 2021-12-01 NOTE — Op Note (Signed)
[name] Patient Name: Eric Hood Procedure Date: 12/01/2021 10:32 AM MRN: [id number] Endoscopist: Ladene Artist , MD Age: 41 Referring MD:  Date of Birth: 1981/06/15 Gender: Male Account #: [id number] Procedure:                Upper GI endoscopy Indications:              Dysphagia Medicines:                Monitored Anesthesia Care Procedure:                Pre-Anesthesia Assessment:                           - Prior to the procedure, a History and Physical                            was performed, and patient medications and                            allergies were reviewed. The patient's tolerance of                            previous anesthesia was also reviewed. The risks                            and benefits of the procedure and the sedation                            options and risks were discussed with the patient.                            All questions were answered, and informed consent                            was obtained. Prior Anticoagulants: The patient has                            taken no previous anticoagulant or antiplatelet                            agents. ASA Grade Assessment: II - A patient with                            mild systemic disease. After reviewing the risks                            and benefits, the patient was deemed in                            satisfactory condition to undergo the procedure.                           After obtaining informed consent, the endoscope was  passed under direct vision. Throughout the                            procedure, the patient's blood pressure, pulse, and                            oxygen saturations were monitored continuously. The                            GIF [id number] #[id number] was introduced through the                            mouth, and advanced to the second part of duodenum.                            The upper GI endoscopy was accomplished without                             difficulty. The patient tolerated the procedure                            well. Scope In: Scope Out: Findings:                 There were esophageal mucosal changes secondary to                            established long-segment Barrett's disease present                            in the mid esophagus and in the distal esophagus.                            The maximum longitudinal extent of these mucosal                            changes was 10 cm in length. Mucosa was biopsied                            with a cold forceps for histology in 4 quadrants at                            intervals of 1.5 cm in the middle third of the                            esophagus and in the lower third of the esophagus.                            One specimen bottle was sent to pathology.                           The proximal esophagus was normal. Biopsies were  taken with a cold forceps for histology.                           One superficial esophageal ulcer with no bleeding                            and no stigmata of recent bleeding was found 34 cm                            from the incisors. The lesion was 10 mm in largest                            dimension.                           One benign-appearing, intrinsic moderate stenosis                            was found 26 cm from the incisors. This stenosis                            measured 1 cm (inner diameter) x less than one cm                            (in length). The stenosis was traversed after                            dilation. A guidewire was placed and the scope was                            withdrawn. Dilation was performed with a Savary                            dilator with mild resistance at 12 mm. The dilation                            site was examined following endoscope reinsertion                            and showed moderate mucosal disruption and moderate                             improvement in luminal narrowing.                           A medium-sized hiatal hernia was present.                           The exam of the stomach was otherwise normal.                           The duodenal bulb and second portion of the  duodenum were normal. Complications:            No immediate complications. Estimated Blood Loss:     Estimated blood loss was minimal. Impression:               - Esophageal mucosal changes secondary to                            established long-segment Barrett's disease.                            Biopsied.                           - Distal esophageal ulcer.                           - Normal proximal esophagus. Biopsied.                           - Benign-appearing esophageal stenosis. Dilated.                           - Medium-sized hiatal hernia.                           - Normal duodenal bulb and second portion of the                            duodenum. Recommendation:           - Patient has a contact number available for                            emergencies. The signs and symptoms of potential                            delayed complications were discussed with the                            patient. Return to normal activities tomorrow.                            Written discharge instructions were provided to the                            patient.                           - Clear liquid diet for 2 hours, then advance as                            tolerated to soft diet today.                           - Resume prior diet tomorrow.                           -  Follow antireflux measures.                           - Continue present medications.                           - Await pathology results.                           - Return to GI office in 3 months. Ladene Artist, MD 12/01/2021 11:09:14 AM This report has been signed electronically.

## 2021-12-06 ENCOUNTER — Telehealth: Payer: Self-pay

## 2021-12-06 NOTE — Telephone Encounter (Signed)
°  Follow up Call-  Call back number 12/01/2021 06/07/2021 07/07/2020 06/01/2020 04/26/2020 12/30/2019 11/27/2019  Post procedure Call Back phone  # [phone]  Permission to leave phone message Yes Yes Yes Yes Yes Yes Yes  Some recent data might be hidden     Patient questions:  Do you have a fever, pain , or abdominal swelling? No. Pain Score  0 *  Have you tolerated food without any problems? Yes.    Have you been able to return to your normal activities? Yes.    Do you have any questions about your discharge instructions: Diet   No. Medications  No. Follow up visit  No.  Do you have questions or concerns about your Care? No.  Actions: * If pain score is 4 or above: No action needed, pain <4.

## 2021-12-11 ENCOUNTER — Encounter: Payer: Self-pay | Admitting: Gastroenterology

## 2022-01-02 ENCOUNTER — Encounter: Payer: Self-pay | Admitting: Family Medicine

## 2022-01-02 ENCOUNTER — Ambulatory Visit (INDEPENDENT_AMBULATORY_CARE_PROVIDER_SITE_OTHER): Payer: Self-pay | Admitting: Family Medicine

## 2022-01-02 VITALS — BP 120/84 | Temp 98.4°F | Ht 71.0 in | Wt 172.8 lb

## 2022-01-02 DIAGNOSIS — K2100 Gastro-esophageal reflux disease with esophagitis, without bleeding: Secondary | ICD-10-CM

## 2022-01-02 DIAGNOSIS — D509 Iron deficiency anemia, unspecified: Secondary | ICD-10-CM

## 2022-01-02 DIAGNOSIS — R062 Wheezing: Secondary | ICD-10-CM

## 2022-01-02 DIAGNOSIS — D500 Iron deficiency anemia secondary to blood loss (chronic): Secondary | ICD-10-CM

## 2022-01-02 LAB — CBC WITH DIFFERENTIAL/PLATELET
Basophils Absolute: 0 10*3/uL (ref 0.0–0.1)
Basophils Relative: 0.7 % (ref 0.0–3.0)
Eosinophils Absolute: 1 10*3/uL — ABNORMAL HIGH (ref 0.0–0.7)
Eosinophils Relative: 18.2 % — ABNORMAL HIGH (ref 0.0–5.0)
HCT: 40.3 % (ref 39.0–52.0)
Hemoglobin: 12.9 g/dL — ABNORMAL LOW (ref 13.0–17.0)
Lymphocytes Relative: 23.4 % (ref 12.0–46.0)
Lymphs Abs: 1.3 10*3/uL (ref 0.7–4.0)
MCHC: 32 g/dL (ref 30.0–36.0)
MCV: 77 fl — ABNORMAL LOW (ref 78.0–100.0)
Monocytes Absolute: 0.4 10*3/uL (ref 0.1–1.0)
Monocytes Relative: 7 % (ref 3.0–12.0)
Neutro Abs: 2.8 10*3/uL (ref 1.4–7.7)
Neutrophils Relative %: 50.7 % (ref 43.0–77.0)
Platelets: 310 10*3/uL (ref 150.0–400.0)
RBC: 5.23 Mil/uL (ref 4.22–5.81)
RDW: 17.3 % — ABNORMAL HIGH (ref 11.5–15.5)
WBC: 5.6 10*3/uL (ref 4.0–10.5)

## 2022-01-02 LAB — FERRITIN: Ferritin: 6.8 ng/mL — ABNORMAL LOW (ref 22.0–322.0)

## 2022-01-02 MED ORDER — ALBUTEROL SULFATE HFA 108 (90 BASE) MCG/ACT IN AERS: 2.0000 | INHALATION_SPRAY | RESPIRATORY_TRACT | 3 refills | Status: DC | PRN

## 2022-01-02 NOTE — Progress Notes (Signed)
? ?Established Patient Office Visit ? ?Subjective:  ?Patient ID: Eric Hood, male    DOB: [birth year]  Age: 41 y.o. MRN: [id number] ? ?CC:  ?Chief Complaint  ?Patient presents with  ? Medication Consultation  ? ? ?HPI ?Eric Hood presents for presents for medical follow-up.  He specifically is requesting albuterol inhaler.  He had an event last May where he went to the ER after waking up around 1 AM with some acute shortness of breath and apparently had some wheezing.  He had no acute findings on chest x-ray except for question of right upper lobe collapse but this was not confirmed on CT there was only some atelectasis.  No pneumonia.  He states that generally he wheezes only about twice per month.  Not aware of specific triggers.  No history of firm asthma.  He has had good success with albuterol in the past when he does use this. ? ?Longstanding history of iron deficiency anemia.  He has longstanding history of GERD and esophageal stenosis with multiple prior endoscopic procedures for dilatation.  He had severe esophagitis changes in the past.  No current dysphagia.  No melena.  No abdominal pain.  No dizziness.  He would like to get his hemoglobin reassessed.  Last ferritin was very low.  He is currently not taking any iron. ? ?Past Medical History:  ?Diagnosis Date  ? Anemia 10/23/2014  ? Anxiety   ? Asthma   ? Back pain   ? Barrett's esophagus   ? Blood transfusion without reported diagnosis april 2016/feb 2020  ? Breathing difficulty 12/13/2019  ? went to ER and felt unable to breathe  ? Esophageal stricture 03/2014  ? Esophageal ulcer   ? GERD (gastroesophageal reflux disease)   ? Hiatal hernia   ? Reflux   ? 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 Ulcerative esophagitis 03/2014  ? severe  ? ? ?Past Surgical History:  ?Procedure Laterality Date  ? BALLOON DILATION N/A 05/20/2015  ? Procedure: BALLOON DILATION;  Surgeon: Milus Banister, MD;  Location: [facility];  Service: Endoscopy;  Laterality:  N/A;  ? COLONOSCOPY  2018  ? ESOPHAGEAL DILATION    ? On 3 occasions: 03/2014, 08/2014, 09/2014  ? ESOPHAGOGASTRODUODENOSCOPY N/A 05/20/2015  ? Procedure: ESOPHAGOGASTRODUODENOSCOPY (EGD);  Surgeon: Milus Banister, MD;  Location: [facility];  Service: Endoscopy;  Laterality: N/A;  ? ESOPHAGOGASTRODUODENOSCOPY Left 05/25/2021  ? Procedure: ESOPHAGOGASTRODUODENOSCOPY (EGD);  Surgeon: Lavena Bullion, DO;  Location: [facility];  Service: Gastroenterology;  Laterality: Left;  ? ESOPHAGOGASTRODUODENOSCOPY (EGD) WITH PROPOFOL N/A 01/18/2015  ? Procedure: ESOPHAGOGASTRODUODENOSCOPY (EGD) WITH PROPOFOL;  Surgeon: Lafayette Dragon, MD;  Location: [facility];  Service: Endoscopy;  Laterality: N/A;  ? ESOPHAGOGASTRODUODENOSCOPY (EGD) WITH PROPOFOL N/A 09/04/2017  ? Procedure: ESOPHAGOGASTRODUODENOSCOPY (EGD) WITH PROPOFOL;  Surgeon: Ladene Artist, MD;  Location: [facility];  Service: Endoscopy;  Laterality: N/A;  need fluro  ? ESOPHAGOGASTRODUODENOSCOPY (EGD) WITH PROPOFOL N/A 10/01/2017  ? Procedure: ESOPHAGOGASTRODUODENOSCOPY (EGD) WITH PROPOFOL;  Surgeon: Ladene Artist, MD;  Location: [facility];  Service: Endoscopy;  Laterality: N/A;  ? FOREIGN BODY REMOVAL  05/25/2021  ? Procedure: FOREIGN BODY REMOVAL;  Surgeon: Lavena Bullion, DO;  Location: [facility];  Service: Gastroenterology;;  ? HAND SURGERY Right   ? SAVORY DILATION N/A 09/04/2017  ? Procedure: SAVORY DILATION;  Surgeon: Ladene Artist, MD;  Location: [facility];  Service: Endoscopy;  Laterality: N/A;  ? SAVORY DILATION N/A 10/01/2017  ?  Procedure: SAVORY DILATION;  Surgeon: Ladene Artist, MD;  Location: [facility];  Service: Endoscopy;  Laterality: N/A;  ? UPPER GASTROINTESTINAL ENDOSCOPY  11/27/2019  ? ? ?Family History  ?Problem Relation Age of Onset  ? Hypertension Mother   ? Hypertension Father   ? Colon cancer Neg Hx   ? Esophageal cancer Neg Hx   ? Rectal cancer Neg Hx   ? Stomach cancer Neg Hx   ? Colon polyps Neg Hx   ? Liver  cancer Neg Hx   ? Pancreatic cancer Neg Hx   ? ? ?Social History  ? ?Socioeconomic History  ? Marital status: Single  ?  Spouse name: Not on file  ? Number of children: Not on file  ? Years of education: Not on file  ? Highest education level: Not on file  ?Occupational History  ? Not on file  ?Tobacco Use  ? Smoking status: Never  ? Smokeless tobacco: Never  ?Vaping Use  ? Vaping Use: Never used  ?Substance and Sexual Activity  ? Alcohol use: Not Currently  ? Drug use: Not Currently  ?  Types: Heroin  ?  Comment: currently taking Methadone  ? Sexual activity: Not on file  ?Other Topics Concern  ? Not on file  ?Social History Narrative  ? Not on file  ? ?Social Determinants of Health  ? ?Financial Resource Strain: Not on file  ?Food Insecurity: Not on file  ?Transportation Needs: Not on file  ?Physical Activity: Not on file  ?Stress: Not on file  ?Social Connections: Not on file  ?Intimate Partner Violence: Not on file  ? ? ?Outpatient Medications Prior to Visit  ?Medication Sig Dispense Refill  ? famotidine (PEPCID) 40 MG tablet Take 1 tablet (40 mg total) by mouth at bedtime. 90 tablet 3  ? pantoprazole (PROTONIX) 40 MG tablet TAKE 1 TABLET(40 MG) BY MOUTH TWICE DAILY BEFORE A MEAL 180 tablet 3  ? promethazine (PHENERGAN) 12.5 MG tablet Take 1 tablet (12.5 mg total) by mouth every 8 (eight) hours as needed for nausea or vomiting. Take 1 tablet mouth three times a day as needed 60 tablet 2  ? albuterol (VENTOLIN HFA) 108 (90 Base) MCG/ACT inhaler Inhale 2 puffs into the lungs every 4 (four) hours as needed for wheezing or shortness of breath. 1 each 0  ? ?No facility-administered medications prior to visit.  ? ? ?Allergies  ?Allergen Reactions  ? Atarax [Hydroxyzine] Anaphylaxis  ? Hydrocodone Nausea And Vomiting  ? ? ?ROS ?Review of Systems  ?Constitutional:  Negative for appetite change, chills and unexpected weight change.  ?HENT:  Negative for trouble swallowing.   ?Respiratory:  Negative for cough and  shortness of breath.   ?Cardiovascular:  Negative for chest pain.  ?Gastrointestinal:  Negative for abdominal pain and blood in stool.  ? ?  ?Objective:  ?  ?Physical Exam ?Vitals reviewed.  ?Constitutional:   ?   Appearance: Normal appearance.  ?Cardiovascular:  ?   Rate and Rhythm: Normal rate and regular rhythm.  ?Pulmonary:  ?   Effort: Pulmonary effort is normal.  ?   Breath sounds: Normal breath sounds.  ?Musculoskeletal:  ?   Cervical back: Neck supple.  ?Lymphadenopathy:  ?   Cervical: No cervical adenopathy.  ?Skin: ?   Comments: Slightly pale complexion.  ?Neurological:  ?   Mental Status: He is alert.  ? ? ?BP 120/84 (BP Location: Left Arm, Patient Position: Sitting, Cuff Size: Normal)   Temp 98.4 ?F (36.9 ?  C) (Oral)   Ht '5\' 11"'$  (1.803 m)   Wt 172 lb 12.8 oz (78.4 kg)   BMI 24.10 kg/m?  ?Wt Readings from Last 3 Encounters:  ?01/02/22 172 lb 12.8 oz (78.4 kg)  ?12/01/21 165 lb (74.8 kg)  ?11/15/21 165 lb (74.8 kg)  ? ? ? ?Health Maintenance Due  ?Topic Date Due  ? COVID-19 Vaccine (1) Never done  ? Hepatitis C Screening  Never done  ? TETANUS/TDAP  10/25/2019  ? ? ?There are no preventive care reminders to display for this patient. ? ?Lab Results  ?Component Value Date  ? TSH 0.70 07/07/2020  ? ?Lab Results  ?Component Value Date  ? WBC 6.2 05/25/2021  ? HGB 12.0 (L) 05/25/2021  ? HCT 41.8 05/25/2021  ? MCV 75.7 (L) 05/25/2021  ? PLT 399 05/25/2021  ? ?Lab Results  ?Component Value Date  ? NA 140 05/25/2021  ? K 4.3 05/25/2021  ? CO2 34 (H) 05/25/2021  ? GLUCOSE 110 (H) 05/25/2021  ? BUN 14 05/25/2021  ? CREATININE 1.12 05/25/2021  ? BILITOT 0.2 (L) 05/25/2021  ? ALKPHOS 87 05/25/2021  ? AST 18 05/25/2021  ? ALT 14 05/25/2021  ? PROT 8.1 05/25/2021  ? ALBUMIN 4.1 05/25/2021  ? CALCIUM 9.3 05/25/2021  ? ANIONGAP 4 (L) 05/25/2021  ? GFR 74.10 07/07/2020  ? ?No results found for: CHOL ?No results found for: HDL ?No results found for: [place] ?Lab Results  ?Component Value Date  ? TRIG 138 05/18/2015   ? ?No results found for: CHOLHDL ?No results found for: HGBA1C ? ?  ?Assessment & Plan:  ? ?#1 reports of intermittent wheezing.  Sounds like he is having about couple episodes per month.  No clear triggers. ?-Refilled

## 2022-05-12 ENCOUNTER — Encounter: Payer: Self-pay | Admitting: Gastroenterology

## 2022-06-24 ENCOUNTER — Emergency Department (HOSPITAL_COMMUNITY)
Admission: EM | Admit: 2022-06-24 | Discharge: 2022-06-24 | Disposition: A | Discharge status: Home / Self Care | Payer: Self-pay | Attending: Emergency Medicine | Admitting: Emergency Medicine

## 2022-06-24 ENCOUNTER — Encounter (HOSPITAL_COMMUNITY): Payer: Self-pay

## 2022-06-24 ENCOUNTER — Other Ambulatory Visit: Payer: Self-pay

## 2022-06-24 DIAGNOSIS — X58XXXA Exposure to other specified factors, initial encounter: Secondary | ICD-10-CM | POA: Insufficient documentation

## 2022-06-24 DIAGNOSIS — J45909 Unspecified asthma, uncomplicated: Secondary | ICD-10-CM | POA: Insufficient documentation

## 2022-06-24 DIAGNOSIS — T40601A Poisoning by unspecified narcotics, accidental (unintentional), initial encounter: Secondary | ICD-10-CM | POA: Insufficient documentation

## 2022-06-24 DIAGNOSIS — S50312A Abrasion of left elbow, initial encounter: Secondary | ICD-10-CM | POA: Insufficient documentation

## 2022-06-24 LAB — BASIC METABOLIC PANEL
Anion gap: 8 (ref 5–15)
BUN: 13 mg/dL (ref 6–20)
CO2: 25 mmol/L (ref 22–32)
Calcium: 9.7 mg/dL (ref 8.9–10.3)
Chloride: 105 mmol/L (ref 98–111)
Creatinine, Ser: 0.95 mg/dL (ref 0.61–1.24)
GFR, Estimated: >60 mL/min (ref >60)
Glucose, Bld: 103 mg/dL — ABNORMAL HIGH (ref 70–99)
Potassium: 3.7 mmol/L (ref 3.5–5.1)
Sodium: 138 mmol/L (ref 135–145)

## 2022-06-24 LAB — CBC
HCT: 43.3 % (ref 39.0–52.0)
Hemoglobin: 13.4 g/dL (ref 13.0–17.0)
MCH: 26 pg (ref 26.0–34.0)
MCHC: 30.9 g/dL (ref 30.0–36.0)
MCV: 84.1 fL (ref 80.0–100.0)
Platelets: 365 10*3/uL (ref 150–400)
RBC: 5.15 MIL/uL (ref 4.22–5.81)
RDW: 14.1 % (ref 11.5–15.5)
WBC: 7.9 10*3/uL (ref 4.0–10.5)
nRBC: 0 % (ref 0.0–0.2)

## 2022-06-24 MED ORDER — ONDANSETRON HCL 4 MG/2ML IJ SOLN
4.0000 mg | Freq: Once | INTRAMUSCULAR | Status: AC
  Administered 2022-06-24: 4 mg via INTRAVENOUS
  Filled 2022-06-24: qty 2

## 2022-06-24 MED ORDER — SODIUM CHLORIDE 0.9 % IV BOLUS
1000.0000 mL | Freq: Once | INTRAVENOUS | Status: AC
  Administered 2022-06-24: 1000 mL via INTRAVENOUS

## 2022-06-24 MED ORDER — NALOXONE HCL 0.4 MG/ML IJ SOLN
0.4000 mg | Freq: Once | INTRAMUSCULAR | Status: AC
  Administered 2022-06-24: 0.4 mg via INTRAVENOUS
  Filled 2022-06-24: qty 1

## 2022-06-24 MED ORDER — NALOXONE HCL 0.4 MG/ML IJ SOLN
0.4000 mg | Freq: Once | INTRAMUSCULAR | Status: DC
  Filled 2022-06-24: qty 1

## 2022-06-24 MED ORDER — NALOXONE HCL 4 MG/10ML IJ SOLN
0.5000 mg/h | INTRAVENOUS | Status: DC
  Filled 2022-06-24: qty 10

## 2022-06-24 NOTE — ED Triage Notes (Signed)
BIBA Per EMS: Pt coming in for OD on opiates. Pt reports giving self narcan. Narcan given again by fire. Pt alert upon arrival. Pt states he took fentanyl. Pt does have lac on left elbow. Arrives in c-collar due to being found on the ground.

## 2022-06-24 NOTE — ED Notes (Signed)
Pt sitting upright in bed, having full conversations.  Pt able to use urinal w/out assistance.

## 2022-06-24 NOTE — ED Notes (Signed)
Pt admits to snorting fentanyl today

## 2022-06-24 NOTE — ED Notes (Signed)
Pt opens eyes and starts conversation w/ ED RN upon ED RN's arrival to room.  Pt able to have full conversations.  Pt admits to fentanyl benders and states he has gone several days w/out sleep.

## 2022-06-24 NOTE — ED Notes (Signed)
Pt given gingerale at this time.  Pt tolerating well.

## 2022-06-24 NOTE — Discharge Instructions (Signed)
Diagnosed with opiate overdose.  Overall work-up today in the ED is reassuring and it is encouraging that your vitals are normal and that you close to your baseline of mentation and ambulation.  If you have new shortness of breath, increased lethargy, unsteady gait, altered mental status please return to the emergency department for further evaluation.

## 2022-06-24 NOTE — ED Provider Notes (Signed)
Accepted handoff at shift change from Astrid Drafts, PA-C. Please see prior provider note for more detail.   Briefly: Patient is 41 y.o.   DDX: concern for drug overdose  Plan: Observation for 1 more hour if no concerning neuro symptoms and normal vitals consider discharge      RISR  EDTHIS  Physical Exam  BP 129/80   Pulse 85   Temp 97.7 F (36.5 C) (Oral)   Resp 12   Ht 6' (1.829 m)   Wt 74.8 kg   SpO2 99%   BMI 22.38 kg/m   Physical Exam  Procedures  Procedures  ED Course / MDM    Medical Decision Making Amount and/or Complexity of Data Reviewed Labs: ordered.  Risk Prescription drug management.          Harriet Pho, PA-C 06/24/22 1512    Gareth Morgan, MD 06/25/22 2237

## 2022-06-24 NOTE — ED Notes (Signed)
Pt ambulated w/ steady gait w/out need for assistance.

## 2022-06-24 NOTE — ED Notes (Signed)
Pt states understanding of dc instructions, importance of follow up.  Pt denies questions or concerns upon dc. Pt declined wheelchair assistance upon dc. Pt ambulated out of ed w/ steady gait. No belongings left in room upon dc.  

## 2022-06-24 NOTE — ED Notes (Signed)
C-collar removed by C. Groce PA.

## 2022-06-24 NOTE — ED Notes (Signed)
No change noted to pt's mentation after narcan. Pt sleeping however easily awoken w/ verbal stimuli.

## 2022-06-24 NOTE — ED Provider Notes (Signed)
[facility] DEPT Provider Note   CSN: [id number] Arrival date & time: 06/24/22  1053     History  Chief Complaint  Patient presents with   Drug Overdose    Kairo Laubacher is a 41 y.o. male with medical history of breathing difficulty, anemia, esophageal strictures, ulcerative esophagitis, anxiety, asthma, back pain, Barrett's esophagus, GERD, substance abuse.  Patient presents to ED for evaluation of overdose.  Patient reports that last night he snorted fentanyl.  The patient reports that he does this often.  The patient states that after ingesting fentanyl he started to have an impending sense of doom, became very lightheaded and was worried that he was going to overdose.  The patient states that at this time he administered Narcan to himself.  The patient reports he then called EMS who arrived on scene and provided more Narcan.  Patient states he was transported to ED at this time.  The patient denies any shortness of breath, nausea, vomiting, chest pain, fevers.   Drug Overdose Pertinent negatives include no chest pain and no shortness of breath.       Home Medications Prior to Admission medications   Medication Sig Start Date End Date Taking? Authorizing Provider  albuterol (VENTOLIN HFA) 108 (90 Base) MCG/ACT inhaler Inhale 2 puffs into the lungs every 4 (four) hours as needed for wheezing or shortness of breath. 01/02/22   Burchette, Alinda Sierras, MD  famotidine (PEPCID) 40 MG tablet Take 1 tablet (40 mg total) by mouth at bedtime. 11/15/21   Ladene Artist, MD  pantoprazole (PROTONIX) 40 MG tablet TAKE 1 TABLET(40 MG) BY MOUTH TWICE DAILY BEFORE A MEAL 11/15/21   Ladene Artist, MD  promethazine (PHENERGAN) 12.5 MG tablet Take 1 tablet (12.5 mg total) by mouth every 8 (eight) hours as needed for nausea or vomiting. Take 1 tablet mouth three times a day as needed 11/15/21   Ladene Artist, MD      Allergies    Atarax [hydroxyzine] and Hydrocodone     Review of Systems   Review of Systems  Constitutional:  Negative for fever.  Respiratory:  Negative for shortness of breath.   Cardiovascular:  Negative for chest pain.  Gastrointestinal:  Negative for nausea and vomiting.  All other systems reviewed and are negative.   Physical Exam Updated Vital Signs BP 134/79   Pulse 89   Temp 98.3 F (36.8 C) (Oral)   Resp 12   Ht 6' (1.829 m)   Wt 74.8 kg   SpO2 98%   BMI 22.38 kg/m  Physical Exam Vitals and nursing note reviewed.  Constitutional:      General: He is not in acute distress.    Appearance: Normal appearance. He is not ill-appearing, toxic-appearing or diaphoretic.  HENT:     Head: Normocephalic and atraumatic.     Nose: Nose normal. No congestion.     Mouth/Throat:     Mouth: Mucous membranes are moist.     Pharynx: Oropharynx is clear.  Eyes:     Extraocular Movements: Extraocular movements intact.     Conjunctiva/sclera: Conjunctivae normal.     Pupils: Pupils are equal, round, and reactive to light.  Cardiovascular:     Rate and Rhythm: Normal rate and regular rhythm.  Pulmonary:     Effort: Pulmonary effort is normal.     Breath sounds: Normal breath sounds. No wheezing.  Abdominal:     General: Abdomen is flat. Bowel sounds are normal.  Palpations: Abdomen is soft.     Tenderness: There is no abdominal tenderness.  Musculoskeletal:     Cervical back: Normal range of motion and neck supple. No tenderness.  Skin:    General: Skin is warm and dry.     Capillary Refill: Capillary refill takes less than 2 seconds.     Comments: Superficial abrasion to left elbow  Neurological:     Mental Status: He is alert and oriented to person, place, and time.     GCS: GCS eye subscore is 4. GCS verbal subscore is 5. GCS motor subscore is 6.     Cranial Nerves: Cranial nerves 2-12 are intact. No cranial nerve deficit.     Sensory: No sensory deficit.     Motor: Motor function is intact. No weakness.      Coordination: Coordination is intact. Heel to [facility] Test normal.     ED Results / Procedures / Treatments   Labs (all labs ordered are listed, but only abnormal results are displayed) Labs Reviewed  BASIC METABOLIC PANEL - Abnormal; Notable for the following components:      Result Value   Glucose, Bld 103 (*)    All other components within normal limits  CBC    EKG None  Radiology No results found.  Procedures Procedures   Medications Ordered in ED Medications  sodium chloride 0.9 % bolus 1,000 mL (0 mLs Intravenous Stopped 06/24/22 1411)  ondansetron (ZOFRAN) injection 4 mg (4 mg Intravenous Given 06/24/22 1241)  naloxone (NARCAN) injection 0.4 mg (0.4 mg Intravenous Given 06/24/22 1242)  sodium chloride 0.9 % bolus 1,000 mL (1,000 mLs Intravenous New Bag/Given 06/24/22 1419)    ED Course/ Medical Decision Making/ A&P                           Medical Decision Making Amount and/or Complexity of Data Reviewed Labs: ordered.  Risk Prescription drug management.   41 year old male presents to ED for evaluation of suspected overdose.  Please see HPI for further details.  On exam the patient is afebrile and nontachycardic.  The patient is alert and oriented x4.  The patient is nontoxic in appearance.  Patient neurological examination shows no focal neurodeficits.  Patient lung sounds clear bilaterally, he is not hypoxic.  The patient abdomen is soft and compressible throughout.  Patient work-up along with the following: - BMP without any electrolyte derangements - CBC unremarkable, no leukocytosis - EKG sinus rhythm  Patient superficial abrasion to left elbow was bandaged.  Patient was provided with an additional 0.4 mg of Narcan due to decreased respirations.  After this was administered, the patient showed the ability to maintain awareness.  Patient was also treated with 1 L normal saline due to tachycardia.  Patient also provided with 4 mg Zofran for nausea and  vomiting.  At end of shift, the patient was requiring 1 more hour of observation.  The patient was signed out to Joanette Gula, PA-C for observation, pending lab studies.  Patient stable at time of handoff.  Final Clinical Impression(s) / ED Diagnoses Final diagnoses:  Opiate overdose, accidental or unintentional, initial encounter [facility])    Rx / DC Orders ED Discharge Orders     None         Azucena Cecil, PA-C 06/24/22 1529    Gareth Morgan, MD 06/25/22 2237

## 2023-02-21 ENCOUNTER — Telehealth: Payer: Self-pay | Admitting: Gastroenterology

## 2023-02-21 NOTE — Telephone Encounter (Signed)
I spoke with the pt and he tells me that he is beginning to have dysphagia again like he has in the past.  He has a hx of esophageal stricture and barrett's with last EGD 11/2021.  He has an appt with Dr Russella Dar  however that is not until July.  I have found and appt on 6/11 with Doug Sou.  The pt agrees to that appt and will make sure to chew his food well, avoid tough meats, rice, etc.  He will call back if he worsens.

## 2023-02-21 NOTE — Telephone Encounter (Signed)
Inbound call from patient states he is having issues swallowing. Patient is scheduled for an ov  with provider. Requesting a call back from a nurse .Please advise.

## 2023-03-27 ENCOUNTER — Ambulatory Visit: Payer: Self-pay | Admitting: Gastroenterology

## 2023-04-07 ENCOUNTER — Emergency Department (EMERGENCY_DEPARTMENT_HOSPITAL): Payer: Self-pay | Admitting: Certified Registered"

## 2023-04-07 ENCOUNTER — Emergency Department (HOSPITAL_COMMUNITY): Payer: Self-pay

## 2023-04-07 ENCOUNTER — Emergency Department (HOSPITAL_COMMUNITY): Payer: Self-pay | Admitting: Certified Registered"

## 2023-04-07 ENCOUNTER — Encounter (HOSPITAL_COMMUNITY)
Admission: EM | Disposition: A | Discharge status: Home / Self Care | Payer: Self-pay | Source: Home / Self Care | Attending: Emergency Medicine

## 2023-04-07 ENCOUNTER — Ambulatory Visit (HOSPITAL_COMMUNITY)
Admission: EM | Admit: 2023-04-07 | Discharge: 2023-04-07 | Disposition: A | Discharge status: Home / Self Care | Payer: Self-pay | Attending: Emergency Medicine | Admitting: Emergency Medicine

## 2023-04-07 ENCOUNTER — Encounter (HOSPITAL_COMMUNITY): Payer: Self-pay | Admitting: Gastroenterology

## 2023-04-07 ENCOUNTER — Other Ambulatory Visit: Payer: Self-pay

## 2023-04-07 DIAGNOSIS — D649 Anemia, unspecified: Secondary | ICD-10-CM

## 2023-04-07 DIAGNOSIS — R1319 Other dysphagia: Secondary | ICD-10-CM | POA: Insufficient documentation

## 2023-04-07 DIAGNOSIS — T18128A Food in esophagus causing other injury, initial encounter: Secondary | ICD-10-CM

## 2023-04-07 DIAGNOSIS — W44F3XA Food entering into or through a natural orifice, initial encounter: Secondary | ICD-10-CM

## 2023-04-07 DIAGNOSIS — J45909 Unspecified asthma, uncomplicated: Secondary | ICD-10-CM

## 2023-04-07 DIAGNOSIS — K222 Esophageal obstruction: Secondary | ICD-10-CM

## 2023-04-07 DIAGNOSIS — K2270 Barrett's esophagus without dysplasia: Secondary | ICD-10-CM

## 2023-04-07 DIAGNOSIS — E871 Hypo-osmolality and hyponatremia: Secondary | ICD-10-CM | POA: Insufficient documentation

## 2023-04-07 DIAGNOSIS — J189 Pneumonia, unspecified organism: Secondary | ICD-10-CM

## 2023-04-07 DIAGNOSIS — F419 Anxiety disorder, unspecified: Secondary | ICD-10-CM

## 2023-04-07 DIAGNOSIS — K449 Diaphragmatic hernia without obstruction or gangrene: Secondary | ICD-10-CM

## 2023-04-07 HISTORY — PX: FOREIGN BODY REMOVAL: SHX962

## 2023-04-07 HISTORY — PX: ESOPHAGOGASTRODUODENOSCOPY: SHX5428

## 2023-04-07 LAB — CBC WITH DIFFERENTIAL/PLATELET
Abs Immature Granulocytes: 0.05 10*3/uL (ref 0.00–0.07)
Basophils Absolute: 0 10*3/uL (ref 0.0–0.1)
Basophils Relative: 1 %
Eosinophils Absolute: 0.5 10*3/uL (ref 0.0–0.5)
Eosinophils Relative: 10 %
HCT: 45.6 % (ref 39.0–52.0)
Hemoglobin: 13.6 g/dL (ref 13.0–17.0)
Immature Granulocytes: 1 %
Lymphocytes Relative: 29 %
Lymphs Abs: 1.3 10*3/uL (ref 0.7–4.0)
MCH: 24.8 pg — ABNORMAL LOW (ref 26.0–34.0)
MCHC: 29.8 g/dL — ABNORMAL LOW (ref 30.0–36.0)
MCV: 83.2 fL (ref 80.0–100.0)
Monocytes Absolute: 0.4 10*3/uL (ref 0.1–1.0)
Monocytes Relative: 8 %
Neutro Abs: 2.3 10*3/uL (ref 1.7–7.7)
Neutrophils Relative %: 51 %
Platelets: 347 10*3/uL (ref 150–400)
RBC: 5.48 MIL/uL (ref 4.22–5.81)
RDW: 15.9 % — ABNORMAL HIGH (ref 11.5–15.5)
WBC: 4.5 10*3/uL (ref 4.0–10.5)
nRBC: 0 % (ref 0.0–0.2)

## 2023-04-07 LAB — BASIC METABOLIC PANEL
Anion gap: 8 (ref 5–15)
BUN: 14 mg/dL (ref 6–20)
CO2: 26 mmol/L (ref 22–32)
Calcium: 9.2 mg/dL (ref 8.9–10.3)
Chloride: 100 mmol/L (ref 98–111)
Creatinine, Ser: 0.88 mg/dL (ref 0.61–1.24)
GFR, Estimated: >60 mL/min (ref >60)
Glucose, Bld: 96 mg/dL (ref 70–99)
Potassium: 3.9 mmol/L (ref 3.5–5.1)
Sodium: 134 mmol/L — ABNORMAL LOW (ref 135–145)

## 2023-04-07 LAB — CBG MONITORING, ED: Glucose-Capillary: 85 mg/dL (ref 70–99)

## 2023-04-07 SURGERY — EGD (ESOPHAGOGASTRODUODENOSCOPY)
Anesthesia: General | Laterality: Left

## 2023-04-07 MED ORDER — ONDANSETRON HCL 4 MG/2ML IJ SOLN
INTRAMUSCULAR | Status: AC
  Filled 2023-04-07: qty 2

## 2023-04-07 MED ORDER — SODIUM CHLORIDE 0.9 % IV SOLN: INTRAVENOUS | Status: DC

## 2023-04-07 MED ORDER — SUCCINYLCHOLINE CHLORIDE 200 MG/10ML IV SOSY
PREFILLED_SYRINGE | INTRAVENOUS | Status: AC
  Filled 2023-04-07: qty 10

## 2023-04-07 MED ORDER — SUCCINYLCHOLINE CHLORIDE 200 MG/10ML IV SOSY
PREFILLED_SYRINGE | INTRAVENOUS | Status: DC | PRN
  Administered 2023-04-07: 120 mg via INTRAVENOUS

## 2023-04-07 MED ORDER — DEXAMETHASONE SODIUM PHOSPHATE 10 MG/ML IJ SOLN
INTRAMUSCULAR | Status: DC | PRN
  Administered 2023-04-07: 10 mg via INTRAVENOUS

## 2023-04-07 MED ORDER — ONDANSETRON HCL 4 MG/2ML IJ SOLN
INTRAMUSCULAR | Status: DC | PRN
  Administered 2023-04-07: 4 mg via INTRAVENOUS

## 2023-04-07 MED ORDER — SUCRALFATE 1 GM/10ML PO SUSP: 1.0000 g | Freq: Two times a day (BID) | ORAL | 1 refills | Status: DC

## 2023-04-07 MED ORDER — LIDOCAINE 2% (20 MG/ML) 5 ML SYRINGE
INTRAMUSCULAR | Status: DC | PRN
  Administered 2023-04-07: 75 mg via INTRAVENOUS

## 2023-04-07 MED ORDER — LACTATED RINGERS IV SOLN
INTRAVENOUS | Status: AC | PRN
  Administered 2023-04-07: 1000 mL via INTRAVENOUS

## 2023-04-07 MED ORDER — PROPOFOL 10 MG/ML IV BOLUS
INTRAVENOUS | Status: AC
  Filled 2023-04-07: qty 20

## 2023-04-07 MED ORDER — ROCURONIUM BROMIDE 10 MG/ML (PF) SYRINGE
PREFILLED_SYRINGE | INTRAVENOUS | Status: AC
  Filled 2023-04-07: qty 10

## 2023-04-07 MED ORDER — SODIUM CHLORIDE 0.9 % IV BOLUS
1000.0000 mL | Freq: Once | INTRAVENOUS | Status: AC
  Administered 2023-04-07: 1000 mL via INTRAVENOUS

## 2023-04-07 MED ORDER — DEXAMETHASONE SODIUM PHOSPHATE 10 MG/ML IJ SOLN
INTRAMUSCULAR | Status: AC
  Filled 2023-04-07: qty 1

## 2023-04-07 MED ORDER — PROPOFOL 10 MG/ML IV BOLUS
INTRAVENOUS | Status: DC | PRN
  Administered 2023-04-07: 160 mg via INTRAVENOUS

## 2023-04-07 NOTE — Consult Note (Addendum)
  Consultation  Referring Provider:     [facility] Primary Care Physician:  Burchette, Bruce W, MD Primary Gastroenterologist:        Dr. Stark Reason for Consultation:     Esophageal food impaction, dysphagia         HPI:   Eric Hood is a 41 y.o. male presents to the ER with acute esophageal food impaction.  Was apparently eating a sausage biscuit yesterday morning and felt it get stuck.  Has been unable to swallow anything since then.  Tried drinking Gatorade, but this immediately regurgitated wrap back out.  Unable to tolerate secretions.  Has had similar symptoms in the past with esophageal food impaction 2/2 known esophageal stricture.  He actually had an appointment scheduled with Jessica Zehr on 03/27/2023 in our office due to progressive return of dysphagia over the last couple months, but was a no-show.  He has had 27 upper endoscopies in our system since 03/2014.  Most recent upper endoscopy was 12/01/2021 which was notable for long segment Barrett's Esophagus measuring 10 cm in length (path: Nondysplastic Barrett's Esophagus), 10 mm superficial esophageal ulcer at 34 cm, moderate stenosis at 26 cm measuring 1 cm inner diameter and dilated with 12 mm Savary dilator, medium size hiatal hernia, otherwise normal stomach/duodenum.  Had recommended follow-up in 3 months, but he has not followed up in our office.  EGD previous to that was 06/07/2021, again with moderate stenosis at 26 cm, dilated with 15 mm Savary, along with 10 cm segment of Barrett's Esophagus, 2 superficial esophageal ulcers in the distal esophagus, medium size hiatal hernia.  Last esophageal food impaction was 05/25/2021 with emergent endoscopy by me with retrieval of food from the mid esophagus using a Roth net.  There was a severe stenosis at 30 cm measuring 6 mm in inner diameter and was not traversable.  His GERD is otherwise largely well-controlled when he takes his medications.  He did recently go on vacation and did  not take his medications with him.  Otherwise normally takes Protonix 40 mg bid.  Otherwise, no SOB, CP, fever.  No lower GI symptoms.  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   Ulcerative esophagitis 03/2014   severe    Past Surgical History:  Procedure Laterality Date   BALLOON DILATION N/A 05/20/2015   Procedure: BALLOON DILATION;  Surgeon: Daniel P Jacobs, MD;  Location: [facility];  Service: Endoscopy;  Laterality: N/A;   COLONOSCOPY  2018   ESOPHAGEAL DILATION     On 3 occasions: 03/2014, 08/2014, 09/2014   ESOPHAGOGASTRODUODENOSCOPY N/A 05/20/2015   Procedure: ESOPHAGOGASTRODUODENOSCOPY (EGD);  Surgeon: Daniel P Jacobs, MD;  Location: [facility];  Service: Endoscopy;  Laterality: N/A;   ESOPHAGOGASTRODUODENOSCOPY Left 05/25/2021   Procedure: ESOPHAGOGASTRODUODENOSCOPY (EGD);  Surgeon: Weyman Bogdon V, DO;  Location: [facility];  Service: Gastroenterology;  Laterality: Left;   ESOPHAGOGASTRODUODENOSCOPY (EGD) WITH PROPOFOL N/A 01/18/2015   Procedure: ESOPHAGOGASTRODUODENOSCOPY (EGD) WITH PROPOFOL;  Surgeon: Dora M Brodie, MD;  Location: [facility];    Service: Endoscopy;  Laterality: N/A;   ESOPHAGOGASTRODUODENOSCOPY (EGD) WITH PROPOFOL N/A 09/04/2017   Procedure: ESOPHAGOGASTRODUODENOSCOPY (EGD) WITH PROPOFOL;  Surgeon: Stark, Malcolm T, MD;  Location: [facility];  Service: Endoscopy;  Laterality: N/A;  need fluro   ESOPHAGOGASTRODUODENOSCOPY (EGD) WITH PROPOFOL N/A 10/01/2017   Procedure: ESOPHAGOGASTRODUODENOSCOPY (EGD) WITH PROPOFOL;  Surgeon: Stark, Malcolm T, MD;  Location: [facility];   Service: Endoscopy;  Laterality: N/A;   FOREIGN BODY REMOVAL  05/25/2021   Procedure: FOREIGN BODY REMOVAL;  Surgeon: Oluwatomiwa Kinyon V, DO;  Location: [facility];  Service: Gastroenterology;;   HAND SURGERY Right    SAVORY DILATION N/A 09/04/2017   Procedure: SAVORY DILATION;  Surgeon: Stark, Malcolm T, MD;  Location: [facility];  Service: Endoscopy;  Laterality: N/A;   SAVORY DILATION N/A 10/01/2017   Procedure: SAVORY DILATION;  Surgeon: Stark, Malcolm T, MD;  Location: [facility];  Service: Endoscopy;  Laterality: N/A;   UPPER GASTROINTESTINAL ENDOSCOPY  11/27/2019    Family History  Problem Relation Age of Onset   Hypertension Mother    Hypertension Father    Colon cancer Neg Hx    Esophageal cancer Neg Hx    Rectal cancer Neg Hx    Stomach cancer Neg Hx    Colon polyps Neg Hx    Liver cancer Neg Hx    Pancreatic cancer Neg Hx      Social History   Tobacco Use   Smoking status: Never   Smokeless tobacco: Never  Vaping Use   Vaping Use: Never used  Substance Use Topics   Alcohol use: Not Currently   Drug use: Yes    Types: Heroin    Comment: fentynal    Prior to Admission medications   Medication Sig Start Date End Date Taking? Authorizing Provider  albuterol (VENTOLIN HFA) 108 (90 Base) MCG/ACT inhaler Inhale 2 puffs into the lungs every 4 (four) hours as needed for wheezing or shortness of breath. 01/02/22   Burchette, Bruce W, MD  famotidine (PEPCID) 40 MG tablet Take 1 tablet (40 mg total) by mouth at bedtime. 11/15/21   Stark, Malcolm T, MD  pantoprazole (PROTONIX) 40 MG tablet TAKE 1 TABLET(40 MG) BY MOUTH TWICE DAILY BEFORE A MEAL 11/15/21   Stark, Malcolm T, MD  promethazine (PHENERGAN) 12.5 MG tablet Take 1 tablet (12.5 mg total) by mouth every 8 (eight) hours as needed for nausea or vomiting. Take 1 tablet mouth three times a day as needed 11/15/21   Stark, Malcolm T, MD    No current facility-administered medications for this encounter.   Current  Outpatient Medications  Medication Sig Dispense Refill   albuterol (VENTOLIN HFA) 108 (90 Base) MCG/ACT inhaler Inhale 2 puffs into the lungs every 4 (four) hours as needed for wheezing or shortness of breath. 1 each 3   famotidine (PEPCID) 40 MG tablet Take 1 tablet (40 mg total) by mouth at bedtime. 90 tablet 3   pantoprazole (PROTONIX) 40 MG tablet TAKE 1 TABLET(40 MG) BY MOUTH TWICE DAILY BEFORE A MEAL 180 tablet 3   promethazine (PHENERGAN) 12.5 MG tablet Take 1 tablet (12.5 mg total) by mouth every 8 (eight) hours as needed for nausea or vomiting. Take 1 tablet mouth three times a day as needed 60 tablet 2    Allergies as of 04/07/2023 - Review Complete 04/07/2023  Allergen Reaction Noted   Atarax [hydroxyzine] Anaphylaxis 07/21/2020   Hydrocodone Nausea And Vomiting 09/16/2015     Review of Systems:    As per HPI,   otherwise negative    Physical Exam:  Vital signs in last 24 hours: Temp:  [98.2 F (36.8 C)] 98.2 F (36.8 C) (06/22 0743) Pulse Rate:  [89] 89 (06/22 0743) Resp:  [18] 18 (06/22 0743) BP: (134)/(74) 134/74 (06/22 0743) SpO2:  [93 %] 93 % (06/22 0743) Weight:  [70.3 kg] 70.3 kg (06/22 0745)   General:   Pleasant male in NAD Lungs:  Respirations even and unlabored. Lungs clear to auscultation bilaterally.   No wheezes, crackles, or rhonchi.  Heart:  Regular rate and rhythm; no MRG Abdomen:  Soft, nondistended, nontender. Normal bowel sounds. No appreciable masses or hepatomegaly.  Rectal:  Not performed.  Extremities:  Without edema. Neurologic:  Alert and  oriented x4;  grossly normal neurologically. Skin:  Intact without significant lesions or rashes. Psych:  Alert and cooperative. Normal affect.  LAB RESULTS: Recent Labs    04/07/23 0841  WBC 4.5  HGB 13.6  HCT 45.6  PLT 347   BMET Recent Labs    04/07/23 0841  NA 134*  K 3.9  CL 100  CO2 26  GLUCOSE 96  BUN 14  CREATININE 0.88  CALCIUM 9.2   LFT No results for input(s): "PROT",  "ALBUMIN", "AST", "ALT", "ALKPHOS", "BILITOT", "BILIDIR", "IBILI" in the last 72 hours. PT/INR No results for input(s): "LABPROT", "INR" in the last 72 hours.  STUDIES: DG Neck Soft Tissue  Result Date: 04/07/2023 CLINICAL DATA:  Food bolus. EXAM: NECK SOFT TISSUES - 1+ VIEW COMPARISON:  None Available. FINDINGS: There is no evidence of retropharyngeal soft tissue swelling or epiglottic enlargement. The cervical airway is unremarkable and no radio-opaque foreign body identified. IMPRESSION: Negative. Electronically Signed   By: James  Green Jr M.D.   On: 04/07/2023 09:08   DG Chest 2 View  Result Date: 04/07/2023 CLINICAL DATA:  Possible lodged food bolus. EXAM: CHEST - 2 VIEW COMPARISON:  06/11/2021. FINDINGS: The heart size and mediastinal contours are within normal limits. Both lungs are clear. The visualized skeletal structures are unremarkable. IMPRESSION: No active cardiopulmonary disease. Electronically Signed   By: James  Green Jr M.D.   On: 04/07/2023 09:04       Impression / Plan:   1) Esophageal food impaction 2) History of esophageal stricture 41-year-old male with known history of esophageal stricture and has undergone multiple endoscopies in the past to include serial dilations of known esophageal stricture.  Now presenting with acute esophageal food impaction. - Plan for emergent EGD today - Anesthesia consult for emergent EGD - Plan for elective intubation for procedure  3) Barrett's Esophagus (long segment, nondysplastic) 4) GERD 5) Hiatal hernia - Resume Protonix 40 mg bid  Additional recommendations pending endoscopic findings  The indications, risks, and benefits of EGD were explained to the patient in detail. Risks include but are not limited to bleeding, perforation, adverse reaction to medications, and cardiopulmonary compromise. Sequelae include but are not limited to the possibility of surgery, hospitalization, and mortality. The patient verbalized  understanding and wished to proceed.    Sherrice Creekmore, DO, FACG [facility]    LOS: 0 days   Jazsmin Couse V Nevaya Nagele  04/07/2023, 10:02 AM    

## 2023-04-07 NOTE — Interval H&P Note (Signed)
History and Physical Interval Note:  04/07/2023 11:57 AM  Eric Hood  has presented today for surgery, with the diagnosis of Esophageal food impaction, dysphagia, esophageal stricture.  The various methods of treatment have been discussed with the patient and family. After consideration of risks, benefits and other options for treatment, the patient has consented to  Procedure(s): ESOPHAGOGASTRODUODENOSCOPY (EGD) (Left) as a surgical intervention.  The patient's history has been reviewed, patient examined, no change in status, stable for surgery.  I have reviewed the patient's chart and labs.  Questions were answered to the patient's satisfaction.     Verlin Dike Harlean Regula

## 2023-04-07 NOTE — Discharge Instructions (Signed)
YOU HAD AN ENDOSCOPIC PROCEDURE TODAY: Refer to the procedure report and other information in the discharge instructions given to you for any specific questions about what was found during the examination. If this information does not answer your questions, please call [place] office at [phone] to clarify.  ° °YOU SHOULD EXPECT: Some feelings of bloating in the abdomen. Passage of more gas than usual. Walking can help get rid of the air that was put into your GI tract during the procedure and reduce the bloating. If you had a lower endoscopy (such as a colonoscopy or flexible sigmoidoscopy) you may notice spotting of blood in your stool or on the toilet paper. Some abdominal soreness may be present for a day or two, also. ° °DIET: Your first meal following the procedure should be a light meal and then it is ok to progress to your normal diet. A half-sandwich or bowl of soup is an example of a good first meal. Heavy or fried foods are harder to digest and may make you feel nauseous or bloated. Drink plenty of fluids but you should avoid alcoholic beverages for 24 hours. If you had a esophageal dilation, please see attached instructions for diet.   ° °ACTIVITY: Your care partner should take you home directly after the procedure. You should plan to take it easy, moving slowly for the rest of the day. You can resume normal activity the day after the procedure however YOU SHOULD NOT DRIVE, use power tools, machinery or perform tasks that involve climbing or major physical exertion for 24 hours (because of the sedation medicines used during the test).  ° °SYMPTOMS TO REPORT IMMEDIATELY: °A gastroenterologist can be reached at any hour. Please call [phone]  for any of the following symptoms:  °Following lower endoscopy (colonoscopy, flexible sigmoidoscopy) °Excessive amounts of blood in the stool  °Significant tenderness, worsening of abdominal pains  °Swelling of the abdomen that is new, acute  °Fever of 100° or  higher  °Following upper endoscopy (EGD, EUS, ERCP, esophageal dilation) °Vomiting of blood or coffee ground material  °New, significant abdominal pain  °New, significant chest pain or pain under the shoulder blades  °Painful or persistently difficult swallowing  °New shortness of breath  °Black, tarry-looking or red, bloody stools ° °FOLLOW UP:  °If any biopsies were taken you will be contacted by phone or by letter within the next 1-3 weeks. Call [phone]  if you have not heard about the biopsies in 3 weeks.  °Please also call with any specific questions about appointments or follow up tests. ° °

## 2023-04-07 NOTE — Anesthesia Preprocedure Evaluation (Addendum)
Anesthesia Evaluation  Patient identified by MRN, date of birth, ID band Patient awake    Reviewed: Allergy & Precautions, NPO status , Patient's Chart, lab work & pertinent test results  Airway Mallampati: II  TM Distance: >3 FB Neck ROM: Full    Dental no notable dental hx. (+) Dental Advisory Given, Teeth Intact   Pulmonary asthma , pneumonia   Pulmonary exam normal breath sounds clear to auscultation       Cardiovascular Exercise Tolerance: Good Normal cardiovascular exam Rhythm:Regular Rate:Normal     Neuro/Psych  PSYCHIATRIC DISORDERS Anxiety        GI/Hepatic hiatal hernia, PUD,GERD  Medicated and Controlled,,(+)     substance abuse (off methadone x 2-3 weeks)    Endo/Other  negative endocrine ROS    Renal/GU negative Renal ROS     Musculoskeletal negative musculoskeletal ROS (+)  narcotic dependent  Abdominal   Peds  Hematology  (+) Blood dyscrasia, anemia   Anesthesia Other Findings   Reproductive/Obstetrics                             Anesthesia Physical Anesthesia Plan  ASA: 2 and emergent  Anesthesia Plan: General   Post-op Pain Management: Minimal or no pain anticipated   Induction: Intravenous, Rapid sequence and Cricoid pressure planned  PONV Risk Score and Plan: 2 and Ondansetron, Dexamethasone and Treatment may vary due to age or medical condition  Airway Management Planned: Oral ETT  Additional Equipment:   Intra-op Plan:   Post-operative Plan: Extubation in OR  Informed Consent: I have reviewed the patients History and Physical, chart, labs and discussed the procedure including the risks, benefits and alternatives for the proposed anesthesia with the patient or authorized representative who has indicated his/her understanding and acceptance.     Dental advisory given  Plan Discussed with: CRNA  Anesthesia Plan Comments: (Substance abuse on Methadone)         Anesthesia Quick Evaluation

## 2023-04-07 NOTE — ED Notes (Signed)
Alert, NAD, calm, denies questions or needs, verbalizes understanding of ENDO at ~1200, family at [facility], VSS.

## 2023-04-07 NOTE — Anesthesia Postprocedure Evaluation (Signed)
Anesthesia Post Note  Patient: Eric Hood  Procedure(s) Performed: ESOPHAGOGASTRODUODENOSCOPY (EGD) (Left) FOREIGN BODY REMOVAL     Patient location during evaluation: PACU Anesthesia Type: General Level of consciousness: sedated and patient cooperative Pain management: pain level controlled Vital Signs Assessment: post-procedure vital signs reviewed and stable Respiratory status: spontaneous breathing Cardiovascular status: stable Anesthetic complications: no   No notable events documented.  Last Vitals:  Vitals:   04/07/23 1240 04/07/23 1250  BP: 121/68 110/73  Pulse: 71 70  Resp: 14 15  Temp:    SpO2: 96% 100%    Last Pain:  Vitals:   04/07/23 1250  TempSrc:   PainSc: 0-No pain                 Lewie Loron

## 2023-04-07 NOTE — Op Note (Signed)
[facility] Patient Name: Eric Hood Procedure Date: 04/07/2023 MRN: [id number] Attending MD: Doristine Locks , MD, [id number] Date of Birth: 11-12-1980 CSN: [id number] Age: 42 Admit Type: Inpatient Procedure:                Upper GI endoscopy w/ foreign body retrieval Indications:              Dysphagia, Acute esophageal food impaction Providers:                Doristine Locks, MD, Doree Albee, RN, Rozetta Nunnery, Technician Referring MD:              Medicines:                Monitored Anesthesia Care with elective intubation Complications:            No immediate complications. Estimated Blood Loss:     Estimated blood loss: none. Procedure:                Pre-Anesthesia Assessment:                           - Prior to the procedure, a History and Physical                            was performed, and patient medications and                            allergies were reviewed. The patient's tolerance of                            previous anesthesia was also reviewed. The risks                            and benefits of the procedure and the sedation                            options and risks were discussed with the patient.                            All questions were answered, and informed consent                            was obtained. Prior Anticoagulants: The patient has                            taken no anticoagulant or antiplatelet agents. ASA                            Grade Assessment: II - A patient with mild systemic                            disease. After reviewing the risks and benefits,  the patient was deemed in satisfactory condition to                            undergo the procedure.                           After obtaining informed consent, the endoscope was                            passed under direct vision. Throughout the                            procedure, the patient's blood  pressure, pulse, and                            oxygen saturations were monitored continuously. The                            [id number]-H190 ([id number]) Olympus endoscope was introduced                            through the mouth, and advanced to the middle third                            of esophagus. The upper GI endoscopy was                            accomplished without difficulty. The patient                            tolerated the procedure well. Scope In: Scope Out: Findings:      Food bolus was found in the middle third of the esophagus. Removal was       accomplished with a rat-toothed forceps and Roth net.      One benign-appearing, intrinsic severe stenosis was found 28 cm from the       incisors. This stenosis measured 5 mm (inner diameter). The stenosis was       not traversable. There was localized mild mucosal erythema and       inflammation seconday to the food impaction. Impression:               - Food in the middle third of the esophagus.                            Removal was successful.                           - Benign-appearing esophageal stenosis. Moderate Sedation:      Not Applicable - Patient had care per Anesthesia. Recommendation:           - Patient has a contact number available for                            emergencies. The signs and symptoms of potential  delayed complications were discussed with the                            patient. Return to normal activities tomorrow.                            Written discharge instructions were provided to the                            patient.                           - Full liquid diet today, then slowly advance to                            mechanical soft diet as tolerated. Do not advance                            beyond soft diet until follow-up in the GI clinic.                           - Return to GI clinic at appointment to be                            scheduled.                            - Resume Protonix 40 mg twice daily as previously                            prescribed.                           - Repeat upper endoscopy in 4 weeks to check                            healing and for planned esophageal dilation.                            Suspect this will again require serial endoscopies                            with esophageal stricture dilatation.                           - Use sucralfate suspension 1 gram PO BID for 4                            weeks. Procedure Code(s):        --- Professional ---                           [phone], Esophagoscopy, flexible, transoral; with  removal of foreign body(s) Diagnosis Code(s):        --- Professional ---                           [phone], Food in esophagus causing other injury,                            initial encounter                           K22.2, Esophageal obstruction                           R13.10, Dysphagia, unspecified                           T18.108A, Unspecified foreign body in esophagus                            causing other injury, initial encounter CPT copyright 2022 [employer] Association. All rights reserved. The codes documented in this report are preliminary and upon coder review may  be revised to meet current compliance requirements. Doristine Locks, MD 04/07/2023 12:25:11 PM Number of Addenda: 0

## 2023-04-07 NOTE — Anesthesia Procedure Notes (Signed)
Procedure Name: Intubation Date/Time: 04/07/2023 12:03 PM  Performed by: Ponciano Ort, CRNAPre-anesthesia Checklist: Patient identified, Emergency Drugs available, Suction available and Patient being monitored Patient Re-evaluated:Patient Re-evaluated prior to induction Oxygen Delivery Method: Circle system utilized Preoxygenation: Pre-oxygenation with 100% oxygen Induction Type: IV induction, Rapid sequence and Cricoid Pressure applied Laryngoscope Size: Mac and 3 Grade View: Grade I Tube type: Oral Tube size: 7.5 mm Number of attempts: 1 Airway Equipment and Method: Stylet and Oral airway Placement Confirmation: ETT inserted through vocal cords under direct vision, positive ETCO2 and breath sounds checked- equal and bilateral Secured at: 21 cm Tube secured with: Tape Dental Injury: Teeth and Oropharynx as per pre-operative assessment

## 2023-04-07 NOTE — ED Notes (Signed)
GI at [facility]

## 2023-04-07 NOTE — Transfer of Care (Signed)
Immediate Anesthesia Transfer of Care Note  Patient: Eric Hood  Procedure(s) Performed: ESOPHAGOGASTRODUODENOSCOPY (EGD) (Left) FOREIGN BODY REMOVAL  Patient Location: PACU  Anesthesia Type:General  Level of Consciousness: awake, alert , oriented, and patient cooperative  Airway & Oxygen Therapy: Patient Spontanous Breathing and Patient connected to face mask oxygen  Post-op Assessment: Report given to RN and Post -op Vital signs reviewed and stable  Post vital signs: Reviewed and stable  Last Vitals:  Vitals Value Taken Time  BP 106/70 04/07/23 1230  Temp 36.3 C 04/07/23 1224  Pulse 72 04/07/23 1232  Resp 15 04/07/23 1232  SpO2 98 % 04/07/23 1232  Vitals shown include unvalidated device data.  Last Pain:  Vitals:   04/07/23 1230  TempSrc:   PainSc: 0-No pain         Complications: No notable events documented.

## 2023-04-07 NOTE — ED Triage Notes (Addendum)
Patient reports he got sausage biscuit stuck in throat yesterday morning. Unable to swallow anything. Denies pain. No respiratory distress. Patient is a/o x3, engaging in conversation  Patient reports this happens frequent and has had to have scope procedure to remove bolus

## 2023-04-07 NOTE — H&P (View-Only) (Signed)
Consultation  Referring Provider:     [facility] Primary Care Physician:  Kristian Covey, MD Primary Gastroenterologist:        Dr. Russella Dar Reason for Consultation:     Esophageal food impaction, dysphagia         HPI:   Eric Hood is a 43 y.o. male presents to the ER with acute esophageal food impaction.  Was apparently eating a sausage biscuit yesterday morning and felt it get stuck.  Has been unable to swallow anything since then.  Tried drinking Gatorade, but this immediately regurgitated wrap back out.  Unable to tolerate secretions.  Has had similar symptoms in the past with esophageal food impaction 2/2 known esophageal stricture.  He actually had an appointment scheduled with Doug Sou on 03/27/2023 in our office due to progressive return of dysphagia over the last couple months, but was a no-show.  He has had 27 upper endoscopies in our system since 03/2014.  Most recent upper endoscopy was 12/01/2021 which was notable for long segment Barrett's Esophagus measuring 10 cm in length (path: Nondysplastic Barrett's Esophagus), 10 mm superficial esophageal ulcer at 34 cm, moderate stenosis at 26 cm measuring 1 cm inner diameter and dilated with 12 mm Savary dilator, medium size hiatal hernia, otherwise normal stomach/duodenum.  Had recommended follow-up in 3 months, but he has not followed up in our office.  EGD previous to that was 06/07/2021, again with moderate stenosis at 26 cm, dilated with 15 mm Savary, along with 10 cm segment of Barrett's Esophagus, 2 superficial esophageal ulcers in the distal esophagus, medium size hiatal hernia.  Last esophageal food impaction was 05/25/2021 with emergent endoscopy by me with retrieval of food from the mid esophagus using a Roth net.  There was a severe stenosis at 30 cm measuring 6 mm in inner diameter and was not traversable.  His GERD is otherwise largely well-controlled when he takes his medications.  He did recently go on vacation and did  not take his medications with him.  Otherwise normally takes Protonix 40 mg bid.  Otherwise, no SOB, CP, fever.  No lower GI symptoms.  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   Ulcerative esophagitis 03/2014   severe    Past Surgical History:  Procedure Laterality Date   BALLOON DILATION N/A 05/20/2015   Procedure: BALLOON DILATION;  Surgeon: Rachael Fee, MD;  Location: [facility] ENDOSCOPY;  Service: Endoscopy;  Laterality: N/A;   COLONOSCOPY  2018   ESOPHAGEAL DILATION     On 3 occasions: 03/2014, 08/2014, 09/2014   ESOPHAGOGASTRODUODENOSCOPY N/A 05/20/2015   Procedure: ESOPHAGOGASTRODUODENOSCOPY (EGD);  Surgeon: Rachael Fee, MD;  Location: [facility] ENDOSCOPY;  Service: Endoscopy;  Laterality: N/A;   ESOPHAGOGASTRODUODENOSCOPY Left 05/25/2021   Procedure: ESOPHAGOGASTRODUODENOSCOPY (EGD);  Surgeon: Shellia Cleverly, DO;  Location: [facility] ENDOSCOPY;  Service: Gastroenterology;  Laterality: Left;   ESOPHAGOGASTRODUODENOSCOPY (EGD) WITH PROPOFOL N/A 01/18/2015   Procedure: ESOPHAGOGASTRODUODENOSCOPY (EGD) WITH PROPOFOL;  Surgeon: Hart Carwin, MD;  Location: [facility] ENDOSCOPY;  Service: Endoscopy;  Laterality: N/A;   ESOPHAGOGASTRODUODENOSCOPY (EGD) WITH PROPOFOL N/A 09/04/2017   Procedure: ESOPHAGOGASTRODUODENOSCOPY (EGD) WITH PROPOFOL;  Surgeon: Meryl Dare, MD;  Location: [facility] ENDOSCOPY;  Service: Endoscopy;  Laterality: N/A;  need fluro   ESOPHAGOGASTRODUODENOSCOPY (EGD) WITH PROPOFOL N/A 10/01/2017   Procedure: ESOPHAGOGASTRODUODENOSCOPY (EGD) WITH PROPOFOL;  Surgeon: Meryl Dare, MD;  Location: [facility] ENDOSCOPY;   Service: Endoscopy;  Laterality: N/A;   FOREIGN BODY REMOVAL  05/25/2021   Procedure: FOREIGN BODY REMOVAL;  Surgeon: Shellia Cleverly, DO;  Location: [facility] ENDOSCOPY;  Service: Gastroenterology;;   HAND SURGERY Right    SAVORY DILATION N/A 09/04/2017   Procedure: SAVORY DILATION;  Surgeon: Meryl Dare, MD;  Location: [facility] ENDOSCOPY;  Service: Endoscopy;  Laterality: N/A;   SAVORY DILATION N/A 10/01/2017   Procedure: SAVORY DILATION;  Surgeon: Meryl Dare, MD;  Location: [facility] ENDOSCOPY;  Service: Endoscopy;  Laterality: N/A;   UPPER GASTROINTESTINAL ENDOSCOPY  11/27/2019    Family History  Problem Relation Age of Onset   Hypertension Mother    Hypertension Father    Colon cancer Neg Hx    Esophageal cancer Neg Hx    Rectal cancer Neg Hx    Stomach cancer Neg Hx    Colon polyps Neg Hx    Liver cancer Neg Hx    Pancreatic cancer Neg Hx      Social History   Tobacco Use   Smoking status: Never   Smokeless tobacco: Never  Vaping Use   Vaping Use: Never used  Substance Use Topics   Alcohol use: Not Currently   Drug use: Yes    Types: Heroin    Comment: fentynal    Prior to Admission medications   Medication Sig Start Date End Date Taking? Authorizing Provider  albuterol (VENTOLIN HFA) 108 (90 Base) MCG/ACT inhaler Inhale 2 puffs into the lungs every 4 (four) hours as needed for wheezing or shortness of breath. 01/02/22   Burchette, Elberta Fortis, MD  famotidine (PEPCID) 40 MG tablet Take 1 tablet (40 mg total) by mouth at bedtime. 11/15/21   Meryl Dare, MD  pantoprazole (PROTONIX) 40 MG tablet TAKE 1 TABLET(40 MG) BY MOUTH TWICE DAILY BEFORE A MEAL 11/15/21   Meryl Dare, MD  promethazine (PHENERGAN) 12.5 MG tablet Take 1 tablet (12.5 mg total) by mouth every 8 (eight) hours as needed for nausea or vomiting. Take 1 tablet mouth three times a day as needed 11/15/21   Meryl Dare, MD    No current facility-administered medications for this encounter.   Current  Outpatient Medications  Medication Sig Dispense Refill   albuterol (VENTOLIN HFA) 108 (90 Base) MCG/ACT inhaler Inhale 2 puffs into the lungs every 4 (four) hours as needed for wheezing or shortness of breath. 1 each 3   famotidine (PEPCID) 40 MG tablet Take 1 tablet (40 mg total) by mouth at bedtime. 90 tablet 3   pantoprazole (PROTONIX) 40 MG tablet TAKE 1 TABLET(40 MG) BY MOUTH TWICE DAILY BEFORE A MEAL 180 tablet 3   promethazine (PHENERGAN) 12.5 MG tablet Take 1 tablet (12.5 mg total) by mouth every 8 (eight) hours as needed for nausea or vomiting. Take 1 tablet mouth three times a day as needed 60 tablet 2    Allergies as of 04/07/2023 - Review Complete 04/07/2023  Allergen Reaction Noted   Atarax [hydroxyzine] Anaphylaxis 07/21/2020   Hydrocodone Nausea And Vomiting 09/16/2015     Review of Systems:    As per HPI,  otherwise negative    Physical Exam:  Vital signs in last 24 hours: Temp:  [98.2 F (36.8 C)] 98.2 F (36.8 C) (06/22 0743) Pulse Rate:  [89] 89 (06/22 0743) Resp:  [18] 18 (06/22 0743) BP: (134)/(74) 134/74 (06/22 0743) SpO2:  [93 %] 93 % (06/22 0743) Weight:  [70.3 kg] 70.3 kg (06/22 0745)   General:   Pleasant male in NAD Lungs:  Respirations even and unlabored. Lungs clear to auscultation bilaterally.   No wheezes, crackles, or rhonchi.  Heart:  Regular rate and rhythm; no MRG Abdomen:  Soft, nondistended, nontender. Normal bowel sounds. No appreciable masses or hepatomegaly.  Rectal:  Not performed.  Extremities:  Without edema. Neurologic:  Alert and  oriented x4;  grossly normal neurologically. Skin:  Intact without significant lesions or rashes. Psych:  Alert and cooperative. Normal affect.  LAB RESULTS: Recent Labs    04/07/23 0841  WBC 4.5  HGB 13.6  HCT 45.6  PLT 347   BMET Recent Labs    04/07/23 0841  NA 134*  K 3.9  CL 100  CO2 26  GLUCOSE 96  BUN 14  CREATININE 0.88  CALCIUM 9.2   LFT No results for input(s): "PROT",  "ALBUMIN", "AST", "ALT", "ALKPHOS", "BILITOT", "BILIDIR", "IBILI" in the last 72 hours. PT/INR No results for input(s): "LABPROT", "INR" in the last 72 hours.  STUDIES: DG Neck Soft Tissue  Result Date: 04/07/2023 CLINICAL DATA:  Food bolus. EXAM: NECK SOFT TISSUES - 1+ VIEW COMPARISON:  None Available. FINDINGS: There is no evidence of retropharyngeal soft tissue swelling or epiglottic enlargement. The cervical airway is unremarkable and no radio-opaque foreign body identified. IMPRESSION: Negative. Electronically Signed   By: Lupita Raider M.D.   On: 04/07/2023 09:08   DG Chest 2 View  Result Date: 04/07/2023 CLINICAL DATA:  Possible lodged food bolus. EXAM: CHEST - 2 VIEW COMPARISON:  06/11/2021. FINDINGS: The heart size and mediastinal contours are within normal limits. Both lungs are clear. The visualized skeletal structures are unremarkable. IMPRESSION: No active cardiopulmonary disease. Electronically Signed   By: Lupita Raider M.D.   On: 04/07/2023 09:04       Impression / Plan:   1) Esophageal food impaction 2) History of esophageal stricture 42 year old male with known history of esophageal stricture and has undergone multiple endoscopies in the past to include serial dilations of known esophageal stricture.  Now presenting with acute esophageal food impaction. - Plan for emergent EGD today - Anesthesia consult for emergent EGD - Plan for elective intubation for procedure  3) Barrett's Esophagus (long segment, nondysplastic) 4) GERD 5) Hiatal hernia - Resume Protonix 40 mg bid  Additional recommendations pending endoscopic findings  The indications, risks, and benefits of EGD were explained to the patient in detail. Risks include but are not limited to bleeding, perforation, adverse reaction to medications, and cardiopulmonary compromise. Sequelae include but are not limited to the possibility of surgery, hospitalization, and mortality. The patient verbalized  understanding and wished to proceed.    Doristine Locks, DO, [facility]    LOS: 0 days   Shellia Cleverly  04/07/2023, 10:02 AM

## 2023-04-07 NOTE — ED Provider Notes (Signed)
[place] EMERGENCY DEPARTMENT AT [facility] Provider Note   CSN: [id number] Arrival date & time: 04/07/23  4098     History  Chief Complaint  Patient presents with   Airway Obstruction    Food stuck in throat - patient is breathing fine.     Eric Hood is a 42 y.o. male with past medical history significant for esophageal stricture, hiatal hernia, GERD, Barrett's esophagus, polysubstance abuse presents to the ED complaining of food bolus impaction.  Patient states he was eating a sausage biscuit last night, and it became lodged in his esophagus.  Patient is unable to swallow and states that he is uncomfortable.  Denies choking, stridor, shortness of breath, voice change, nausea, vomiting.     Home Medications Prior to Admission medications   Medication Sig Start Date End Date Taking? Authorizing Provider  albuterol (VENTOLIN HFA) 108 (90 Base) MCG/ACT inhaler Inhale 2 puffs into the lungs every 4 (four) hours as needed for wheezing or shortness of breath. 01/02/22   Burchette, Elberta Fortis, MD  famotidine (PEPCID) 40 MG tablet Take 1 tablet (40 mg total) by mouth at bedtime. 11/15/21   Meryl Dare, MD  pantoprazole (PROTONIX) 40 MG tablet TAKE 1 TABLET(40 MG) BY MOUTH TWICE DAILY BEFORE A MEAL 11/15/21   Meryl Dare, MD  promethazine (PHENERGAN) 12.5 MG tablet Take 1 tablet (12.5 mg total) by mouth every 8 (eight) hours as needed for nausea or vomiting. Take 1 tablet mouth three times a day as needed 11/15/21   Meryl Dare, MD      Allergies    Atarax [hydroxyzine] and Hydrocodone    Review of Systems   Review of Systems  HENT:  Positive for trouble swallowing. Negative for voice change.   Respiratory:  Negative for choking, shortness of breath and stridor.   Gastrointestinal:  Negative for nausea and vomiting.    Physical Exam Updated Vital Signs BP 96/76   Pulse (!) 59   Temp 98.2 F (36.8 C) (Oral)   Resp 11   Ht 6' (1.829 m)   Wt 70.3 kg    SpO2 99%   BMI 21.02 kg/m  Physical Exam Vitals and nursing note reviewed.  Constitutional:      General: He is not in acute distress.    Appearance: Normal appearance. He is not ill-appearing or diaphoretic.  Cardiovascular:     Rate and Rhythm: Normal rate and regular rhythm.     Heart sounds: Normal heart sounds.  Pulmonary:     Effort: Pulmonary effort is normal. No tachypnea or respiratory distress.     Breath sounds: Normal breath sounds and air entry. No stridor.  Abdominal:     General: Abdomen is flat.     Palpations: Abdomen is soft.     Tenderness: There is no abdominal tenderness.  Skin:    General: Skin is warm and dry.     Capillary Refill: Capillary refill takes less than 2 seconds.     Coloration: Skin is pale.  Neurological:     Mental Status: He is alert. Mental status is at baseline.  Psychiatric:        Mood and Affect: Mood normal.        Behavior: Behavior normal.     ED Results / Procedures / Treatments   Labs (all labs ordered are listed, but only abnormal results are displayed) Labs Reviewed  BASIC METABOLIC PANEL - Abnormal; Notable for the following components:  Result Value   Sodium 134 (*)    All other components within normal limits  CBC WITH DIFFERENTIAL/PLATELET - Abnormal; Notable for the following components:   MCH 24.8 (*)    MCHC 29.8 (*)    RDW 15.9 (*)    All other components within normal limits  CBG MONITORING, ED    EKG EKG Interpretation  Date/Time:  Saturday April 07 2023 09:05:20 EDT Ventricular Rate:  82 PR Interval:  163 QRS Duration: 83 QT Interval:  378 QTC Calculation: 442 R Axis:   83 Text Interpretation: Sinus rhythm  diffuse ST elevations c/w pericarditis vs early repol Confirmed by Pricilla Loveless [phone]) on 04/07/2023 9:11:44 AM  Radiology DG Neck Soft Tissue  Result Date: 04/07/2023 CLINICAL DATA:  Food bolus. EXAM: NECK SOFT TISSUES - 1+ VIEW COMPARISON:  None Available. FINDINGS: There is no  evidence of retropharyngeal soft tissue swelling or epiglottic enlargement. The cervical airway is unremarkable and no radio-opaque foreign body identified. IMPRESSION: Negative. Electronically Signed   By: Lupita Raider M.D.   On: 04/07/2023 09:08   DG Chest 2 View  Result Date: 04/07/2023 CLINICAL DATA:  Possible lodged food bolus. EXAM: CHEST - 2 VIEW COMPARISON:  06/11/2021. FINDINGS: The heart size and mediastinal contours are within normal limits. Both lungs are clear. The visualized skeletal structures are unremarkable. IMPRESSION: No active cardiopulmonary disease. Electronically Signed   By: Lupita Raider M.D.   On: 04/07/2023 09:04    Procedures Procedures    Medications Ordered in ED Medications  sodium chloride 0.9 % bolus 1,000 mL (0 mLs Intravenous Stopped 04/07/23 1022)    ED Course/ Medical Decision Making/ A&P                             Medical Decision Making Amount and/or Complexity of Data Reviewed Labs: ordered. Radiology: ordered.   This patient presents to the ED with chief complaint(s) of food bolus impaction with pertinent past medical history of esophageal stricture, Barrett's esophagus, GERD.  The complaint involves an extensive differential diagnosis and also carries with it a high risk of complications and morbidity.    The differential diagnosis includes food bolus impaction, esophageal stricture, esophageal ulcer, globus sensation  The initial plan is to obtain x-rays of soft tissue neck and chest  Additional history obtained: Records reviewed  patient reach out to [facility], who is established with, in May 2024 stating he is having issues swallowing.  Patient told provider he is beginning to have dysphagia like he has in the past.  Patient's last EGD 11/2021 showed esophageal stricture with Barrett's esophagus.  Patient was scheduled on 03/27/2023 with [facility] provider, but did not show for appointment.  Initial Assessment:   Exam significant for  a pale appearing patient who is not in acute respiratory distress.  No audible stridor.  Lungs are clear to auscultation bilaterally with adequate tidal volume.  Patient speaking in full sentences.  He is managing his own secretions currently.  Abdomen is soft and nontender.  Independent ECG/labs interpretation:  The following labs were independently interpreted:  CBC without leukocytosis or anemia.  Metabolic panel with mild hyponatremia, no other electrolyte disturbance.  Renal function is normal.  Patient complained of feeling lightheaded, CBG and EKG were obtained.  CBG 85.  ECG demonstrates a sinus rhythm with likely early repol pattern.  Independent visualization and interpretation of imaging: I independently visualized the following imaging with scope of  interpretation limited to determining acute life threatening conditions related to emergency care: Soft tissue neck and chest, which revealed esophageal stricture with possible food bolus in the upper esophagus.  No radiopaque foreign body.  Treatment and Reassessment: Patient given fluid bolus due to feeling lightheaded, he has an improvement in his symptoms.  Upon reassessment, patient is resting comfortably.  Consultations obtained: I requested consultation with on-call [facility] provider and spoke with NP Alcide Evener and discussed patient history and current presentation.  NP states that she will inform Dr. Barron Alvine and have patient taken to have EGD done  Disposition:   Patient will be transferred to endoscopy to have EGD done today by Dr. Barron Alvine.           Final Clinical Impression(s) / ED Diagnoses Final diagnoses:  Esophageal obstruction due to food impaction  Esophageal dysphagia  Esophageal stricture    Rx / DC Orders ED Discharge Orders     None         Lenard Simmer, PA-C 04/07/23 1116    Pricilla Loveless, MD 04/07/23 1443

## 2023-04-09 ENCOUNTER — Telehealth: Payer: Self-pay

## 2023-04-09 NOTE — Telephone Encounter (Signed)
-----   Message from Meryl Dare, MD sent at 04/07/2023  6:01 PM EDT ----- Regarding: EGD in [facility]  VC, I know him well. Unfortunate that he missed his appt with JZ and that you had to do his EGD this weekend. He has a history of noncompliance with meds. At times I don't think he was able to afford them and at other time I think he just stopped taking them.    Fitzhugh Vizcarrondo, Since I know him well let's set him up directly for an EGD/dilation in the [facility]. Please schedule an office appt for 2-3 months after his EGD.  Thanks. ----- Message ----- From: Shellia Cleverly, DO Sent: 04/07/2023  12:29 PM EDT To: Meryl Dare, MD; Leta Baptist, PA-C  42 year old male with known history of long segment Barrett's Esophagus and esophageal stricture with previous food impactions again presents to the ER with food impaction today.  Was not taking his PPI.  Actually had an appointment scheduled with Shanda Bumps this month but he was a no-show.  Food removed, but again has a significant stricture that was nontraversable with the standard endoscope, similar to a presentation for food impaction in 2022.  I suspect after the acute inflammation quiets down, this should be traversable with EGD and allow for esophageal dilation.  Will discharge home today with resumption of high-dose PPI, adding Carafate, and plan for repeat endoscopy in the next 3-4 weeks or so.  I told him to not advance beyond a mechanical soft diet until he has a chance to be seen in the clinic.  VC

## 2023-04-09 NOTE — Telephone Encounter (Signed)
Shanda Bumps can you please help with this?  Pt needs EGD and previsit as well as f/u appt if available.   Meryl Dare, MD sent at 04/07/2023  6:01 PM EDT -----   Since I know him well let's set him up directly for an EGD/dilation in the [facility]. Pt also needs pre visit.   Please schedule an office appt for 2-3 months after his EGD. If not appt available please put in recall. Thanks.

## 2023-04-10 ENCOUNTER — Encounter (HOSPITAL_COMMUNITY): Payer: Self-pay | Admitting: Gastroenterology

## 2023-04-13 NOTE — Telephone Encounter (Signed)
Called patient to schedule left voicemail. 

## 2023-05-09 ENCOUNTER — Ambulatory Visit: Payer: Self-pay | Admitting: Gastroenterology

## 2023-08-31 ENCOUNTER — Encounter: Payer: Self-pay | Admitting: Family

## 2023-08-31 ENCOUNTER — Ambulatory Visit (INDEPENDENT_AMBULATORY_CARE_PROVIDER_SITE_OTHER): Payer: Self-pay | Admitting: Family

## 2023-08-31 VITALS — BP 121/84 | HR 106 | Temp 98.7°F | Ht 72.0 in | Wt 160.0 lb

## 2023-08-31 DIAGNOSIS — J45901 Unspecified asthma with (acute) exacerbation: Secondary | ICD-10-CM

## 2023-08-31 DIAGNOSIS — R059 Cough, unspecified: Secondary | ICD-10-CM

## 2023-08-31 LAB — POCT INFLUENZA A/B
Influenza A, POC: NEGATIVE
Influenza B, POC: NEGATIVE

## 2023-08-31 LAB — POC COVID19 BINAXNOW: SARS Coronavirus 2 Ag: NEGATIVE

## 2023-08-31 MED ORDER — PREDNISONE 10 MG PO TABS
ORAL_TABLET | ORAL | 0 refills | Status: AC
Start: 2023-08-31 — End: ?

## 2023-08-31 MED ORDER — ALBUTEROL SULFATE HFA 108 (90 BASE) MCG/ACT IN AERS
2.0000 | INHALATION_SPRAY | RESPIRATORY_TRACT | 3 refills | Status: AC | PRN
Start: 2023-08-31 — End: ?

## 2023-08-31 NOTE — Progress Notes (Unsigned)
Patient ID: Eric Hood, male    DOB: 1981/03/07, 42 y.o.   MRN: [id number]  Chief Complaint  Patient presents with  . Sinus Problem    Pt c/o chills, fatigue , cough and SOB. Present since yesterday, Has tried albuterol inhaler which does help SOB.    HPI: URI sx:  Pt c/o chills, fatigue , cough and SOB. Present since yesterday, he reports running out of his rescue inhaler & tried an OTC albuterol inhaler which helps his SOB for a few minutes and then he has to keep reusing. Reports some phlegm with cough.  He states his asthma is usually under control but he had a bad exacerbation last year and family called 911. He also has a recent hx of esophageal stricture that he is being treated for.  Assessment & Plan:  1. Cough, unspecified  - rapid testing negative.  - POC COVID-19 - POCT Influenza A/B  2. Severe asthma with exacerbation, unspecified whether persistent -  - albuterol (VENTOLIN HFA) 108 (90 Base) MCG/ACT inhaler; Inhale 2 puffs into the lungs every 4 (four) hours as needed for wheezing or shortness of breath.  Dispense: 1 each; Refill: 3 - predniSONE (DELTASONE) 10 MG tablet; Take with breakfast:  6 tab day 1, 5 tab day 2-3, 4 tab day 4, 3 tab day 5, 2 tab day 6-7.  Dispense: 27 tablet; Refill: 0  Subjective:    Outpatient Medications Prior to Visit  Medication Sig Dispense Refill  . albuterol (VENTOLIN HFA) 108 (90 Base) MCG/ACT inhaler Inhale 2 puffs into the lungs every 4 (four) hours as needed for wheezing or shortness of breath. 1 each 3  . famotidine (PEPCID) 40 MG tablet Take 1 tablet (40 mg total) by mouth at bedtime. 90 tablet 3  . pantoprazole (PROTONIX) 40 MG tablet TAKE 1 TABLET(40 MG) BY MOUTH TWICE DAILY BEFORE A MEAL 180 tablet 3  . promethazine (PHENERGAN) 12.5 MG tablet Take 1 tablet (12.5 mg total) by mouth every 8 (eight) hours as needed for nausea or vomiting. Take 1 tablet mouth three times a day as needed 60 tablet 2  . sucralfate (CARAFATE) 1  GM/10ML suspension Take 10 mLs (1 g total) by mouth 2 (two) times daily. 420 mL 1   No facility-administered medications prior to visit.   Past Medical History:  Diagnosis Date  . Anemia 10/23/2014  . Anxiety   . Asthma   . Back pain   . Barrett's esophagus   . Blood transfusion without reported diagnosis april 2016/feb 2020  . Breathing difficulty 12/13/2019   went to ER and felt unable to breathe  . Esophageal stricture 03/2014  . Esophageal ulcer   . GERD (gastroesophageal reflux disease)   . Hiatal hernia   . Reflux   . 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 Ulcerative esophagitis 03/2014   severe   Past Surgical History:  Procedure Laterality Date  . BALLOON DILATION N/A 05/20/2015   Procedure: BALLOON DILATION;  Surgeon: Rachael Fee, MD;  Location: [facility] ENDOSCOPY;  Service: Endoscopy;  Laterality: N/A;  . COLONOSCOPY  2018  . ESOPHAGEAL DILATION     On 3 occasions: 03/2014, 08/2014, 09/2014  . ESOPHAGOGASTRODUODENOSCOPY N/A 05/20/2015   Procedure: ESOPHAGOGASTRODUODENOSCOPY (EGD);  Surgeon: Rachael Fee, MD;  Location: [facility] ENDOSCOPY;  Service: Endoscopy;  Laterality: N/A;  . ESOPHAGOGASTRODUODENOSCOPY Left 05/25/2021   Procedure: ESOPHAGOGASTRODUODENOSCOPY (EGD);  Surgeon: Shellia Cleverly, DO;  Location: [facility] ENDOSCOPY;  Service: Gastroenterology;  Laterality: Left;  . ESOPHAGOGASTRODUODENOSCOPY Left 04/07/2023   Procedure: ESOPHAGOGASTRODUODENOSCOPY (EGD);  Surgeon: Shellia Cleverly, DO;  Location: [facility] ENDOSCOPY;  Service: Gastroenterology;  Laterality: Left;  . ESOPHAGOGASTRODUODENOSCOPY (EGD) WITH PROPOFOL N/A 01/18/2015   Procedure: ESOPHAGOGASTRODUODENOSCOPY (EGD) WITH PROPOFOL;  Surgeon: Hart Carwin, MD;  Location: [facility] ENDOSCOPY;  Service: Endoscopy;  Laterality: N/A;  . ESOPHAGOGASTRODUODENOSCOPY (EGD) WITH PROPOFOL N/A 09/04/2017   Procedure: ESOPHAGOGASTRODUODENOSCOPY (EGD) WITH PROPOFOL;  Surgeon: Meryl Dare, MD;  Location: [facility] ENDOSCOPY;  Service:  Endoscopy;  Laterality: N/A;  need fluro  . ESOPHAGOGASTRODUODENOSCOPY (EGD) WITH PROPOFOL N/A 10/01/2017   Procedure: ESOPHAGOGASTRODUODENOSCOPY (EGD) WITH PROPOFOL;  Surgeon: Meryl Dare, MD;  Location: [facility] ENDOSCOPY;  Service: Endoscopy;  Laterality: N/A;  . FOREIGN BODY REMOVAL  05/25/2021   Procedure: FOREIGN BODY REMOVAL;  Surgeon: Shellia Cleverly, DO;  Location: [facility] ENDOSCOPY;  Service: Gastroenterology;;  . FOREIGN BODY REMOVAL  04/07/2023   Procedure: FOREIGN BODY REMOVAL;  Surgeon: Shellia Cleverly, DO;  Location: [facility] ENDOSCOPY;  Service: Gastroenterology;;  . HAND SURGERY Right   . SAVORY DILATION N/A 09/04/2017   Procedure: SAVORY DILATION;  Surgeon: Meryl Dare, MD;  Location: [facility] ENDOSCOPY;  Service: Endoscopy;  Laterality: N/A;  . SAVORY DILATION N/A 10/01/2017   Procedure: SAVORY DILATION;  Surgeon: Meryl Dare, MD;  Location: [facility] ENDOSCOPY;  Service: Endoscopy;  Laterality: N/A;  . UPPER GASTROINTESTINAL ENDOSCOPY  11/27/2019   Allergies  Allergen Reactions  . Atarax [Hydroxyzine] Anaphylaxis  . Hydrocodone Nausea And Vomiting      Objective:    Physical Exam Vitals and nursing note reviewed.  Constitutional:      General: He is not in acute distress.    Appearance: Normal appearance.  HENT:     Head: Normocephalic.     Right Ear: Tympanic membrane and ear canal normal.     Left Ear: Tympanic membrane and ear canal normal.     Nose:     Right Sinus: Frontal sinus tenderness present. No maxillary sinus tenderness.     Left Sinus: Frontal sinus tenderness present. No maxillary sinus tenderness.     Mouth/Throat:     Mouth: Mucous membranes are moist.     Pharynx: No pharyngeal swelling, oropharyngeal exudate, posterior oropharyngeal erythema or uvula swelling.     Tonsils: No tonsillar exudate or tonsillar abscesses.  Cardiovascular:     Rate and Rhythm: Normal rate and regular rhythm.  Pulmonary:     Effort: Pulmonary effort is normal.     Breath  sounds: Normal breath sounds.  Musculoskeletal:        General: Normal range of motion.     Cervical back: Normal range of motion.  Lymphadenopathy:     Head:     Right side of head: No preauricular or posterior auricular adenopathy.     Left side of head: No preauricular or posterior auricular adenopathy.     Cervical: No cervical adenopathy.  Skin:    General: Skin is warm and dry.  Neurological:     Mental Status: He is alert and oriented to person, place, and time.  Psychiatric:        Mood and Affect: Mood normal.   BP 121/84 (BP Location: Left Arm, Patient Position: Sitting, Cuff Size: Large)   Pulse (!) 106   Temp 98.7 F (37.1 C) (Temporal)   Ht 6' (1.829 m)   Wt 160 lb (72.6 kg)   SpO2 91%   BMI 21.70 kg/m  Wt Readings  from Last 3 Encounters:  08/31/23 160 lb (72.6 kg)  04/07/23 155 lb (70.3 kg)  06/24/22 165 lb (74.8 kg)     Dulce Sellar, NP

## 2023-09-03 ENCOUNTER — Encounter: Payer: Self-pay | Admitting: Family

## 2023-09-03 MED ORDER — AIRSUPRA 90-80 MCG/ACT IN AERO
2.0000 | INHALATION_SPRAY | Freq: Four times a day (QID) | RESPIRATORY_TRACT | Status: AC
Start: 2023-09-03 — End: ?

## 2023-11-27 ENCOUNTER — Telehealth: Payer: Self-pay | Admitting: Family Medicine
